# Patient Record
Sex: Male | Born: 1935 | Race: White | Hispanic: No | Marital: Married | Smoking: Former smoker
Health system: Southern US, Community
[De-identification: ages and names within clinical notes are randomized; demographics above are authoritative.]

## PROBLEM LIST (undated history)

## (undated) DIAGNOSIS — E039 Hypothyroidism, unspecified: Secondary | ICD-10-CM

## (undated) DIAGNOSIS — C61 Malignant neoplasm of prostate: Secondary | ICD-10-CM

## (undated) DIAGNOSIS — E785 Hyperlipidemia, unspecified: Secondary | ICD-10-CM

## (undated) DIAGNOSIS — K219 Gastro-esophageal reflux disease without esophagitis: Secondary | ICD-10-CM

## (undated) DIAGNOSIS — I10 Essential (primary) hypertension: Secondary | ICD-10-CM

## (undated) DIAGNOSIS — R0600 Dyspnea, unspecified: Secondary | ICD-10-CM

## (undated) DIAGNOSIS — I714 Abdominal aortic aneurysm, without rupture, unspecified: Secondary | ICD-10-CM

## (undated) DIAGNOSIS — I219 Acute myocardial infarction, unspecified: Secondary | ICD-10-CM

## (undated) DIAGNOSIS — E119 Type 2 diabetes mellitus without complications: Secondary | ICD-10-CM

## (undated) DIAGNOSIS — I509 Heart failure, unspecified: Secondary | ICD-10-CM

## (undated) DIAGNOSIS — M1990 Unspecified osteoarthritis, unspecified site: Secondary | ICD-10-CM

## (undated) DIAGNOSIS — I2510 Atherosclerotic heart disease of native coronary artery without angina pectoris: Secondary | ICD-10-CM

## (undated) DIAGNOSIS — Z87891 Personal history of nicotine dependence: Secondary | ICD-10-CM

## (undated) DIAGNOSIS — Z9289 Personal history of other medical treatment: Secondary | ICD-10-CM

## (undated) DIAGNOSIS — I119 Hypertensive heart disease without heart failure: Secondary | ICD-10-CM

## (undated) DIAGNOSIS — C4490 Unspecified malignant neoplasm of skin, unspecified: Secondary | ICD-10-CM

## (undated) HISTORY — DX: Hypothyroidism, unspecified: E03.9

## (undated) HISTORY — DX: Essential (primary) hypertension: I10

## (undated) HISTORY — DX: Personal history of nicotine dependence: Z87.891

## (undated) HISTORY — DX: Atherosclerotic heart disease of native coronary artery without angina pectoris: I25.10

## (undated) HISTORY — PX: KNEE ARTHROSCOPY: SUR90

## (undated) HISTORY — PX: CATARACT EXTRACTION: SUR2

## (undated) HISTORY — PX: CARDIAC CATHETERIZATION: SHX172

## (undated) HISTORY — PX: EYE SURGERY: SHX253

## (undated) HISTORY — DX: Unspecified osteoarthritis, unspecified site: M19.90

## (undated) HISTORY — DX: Hyperlipidemia, unspecified: E78.5

## (undated) HISTORY — PX: CORONARY ARTERY BYPASS GRAFT: SHX141

---

## 1983-08-01 HISTORY — PX: HERNIA REPAIR: SHX51

## 1998-05-31 ENCOUNTER — Encounter: Payer: Self-pay | Admitting: Cardiovascular Disease

## 1998-05-31 ENCOUNTER — Inpatient Hospital Stay (HOSPITAL_COMMUNITY): Admission: EM | Admit: 1998-05-31 | Discharge: 1998-06-04 | Payer: Self-pay | Admitting: Emergency Medicine

## 1998-06-02 ENCOUNTER — Encounter: Payer: Self-pay | Admitting: Internal Medicine

## 1998-06-03 ENCOUNTER — Encounter: Payer: Self-pay | Admitting: Internal Medicine

## 2000-03-16 ENCOUNTER — Ambulatory Visit (HOSPITAL_COMMUNITY): Admission: RE | Admit: 2000-03-16 | Discharge: 2000-03-16 | Payer: Self-pay | Admitting: Cardiovascular Disease

## 2000-03-21 ENCOUNTER — Encounter: Payer: Self-pay | Admitting: Surgery

## 2000-03-23 ENCOUNTER — Encounter: Payer: Self-pay | Admitting: Surgery

## 2000-03-23 ENCOUNTER — Inpatient Hospital Stay (HOSPITAL_COMMUNITY): Admission: RE | Admit: 2000-03-23 | Discharge: 2000-03-27 | Payer: Self-pay | Admitting: Surgery

## 2000-03-24 ENCOUNTER — Encounter: Payer: Self-pay | Admitting: Surgery

## 2000-03-25 ENCOUNTER — Encounter: Payer: Self-pay | Admitting: Cardiothoracic Surgery

## 2000-03-26 ENCOUNTER — Encounter: Payer: Self-pay | Admitting: Cardiothoracic Surgery

## 2000-06-26 ENCOUNTER — Encounter (HOSPITAL_COMMUNITY): Admission: RE | Admit: 2000-06-26 | Discharge: 2000-09-24 | Payer: Self-pay | Admitting: Cardiovascular Disease

## 2000-07-09 ENCOUNTER — Encounter (HOSPITAL_COMMUNITY): Admission: RE | Admit: 2000-07-09 | Discharge: 2000-07-14 | Payer: Self-pay | Admitting: Cardiovascular Disease

## 2000-12-26 ENCOUNTER — Encounter: Payer: Self-pay | Admitting: Cardiovascular Disease

## 2000-12-26 ENCOUNTER — Inpatient Hospital Stay (HOSPITAL_COMMUNITY): Admission: EM | Admit: 2000-12-26 | Discharge: 2000-12-27 | Payer: Self-pay | Admitting: Emergency Medicine

## 2003-07-27 ENCOUNTER — Inpatient Hospital Stay (HOSPITAL_COMMUNITY): Admission: RE | Admit: 2003-07-27 | Discharge: 2003-07-29 | Payer: Self-pay | Admitting: Orthopedic Surgery

## 2003-07-29 ENCOUNTER — Inpatient Hospital Stay (HOSPITAL_COMMUNITY)
Admission: RE | Admit: 2003-07-29 | Discharge: 2003-08-06 | Payer: Self-pay | Admitting: Physical Medicine & Rehabilitation

## 2003-09-03 ENCOUNTER — Ambulatory Visit (HOSPITAL_COMMUNITY): Admission: RE | Admit: 2003-09-03 | Discharge: 2003-09-03 | Payer: Self-pay | Admitting: Orthopedic Surgery

## 2003-10-09 ENCOUNTER — Ambulatory Visit (HOSPITAL_COMMUNITY): Admission: RE | Admit: 2003-10-09 | Discharge: 2003-10-09 | Payer: Self-pay | Admitting: Gastroenterology

## 2004-01-11 ENCOUNTER — Ambulatory Visit (HOSPITAL_COMMUNITY): Admission: RE | Admit: 2004-01-11 | Discharge: 2004-01-11 | Payer: Self-pay | Admitting: Cardiovascular Disease

## 2005-02-26 ENCOUNTER — Emergency Department (HOSPITAL_COMMUNITY): Admission: EM | Admit: 2005-02-26 | Discharge: 2005-02-26 | Payer: Self-pay | Admitting: Emergency Medicine

## 2008-05-01 ENCOUNTER — Encounter: Admission: RE | Admit: 2008-05-01 | Discharge: 2008-05-01 | Payer: Self-pay | Admitting: Orthopedic Surgery

## 2009-01-25 ENCOUNTER — Inpatient Hospital Stay (HOSPITAL_COMMUNITY): Admission: AD | Admit: 2009-01-25 | Discharge: 2009-01-28 | Payer: Self-pay | Admitting: Cardiovascular Disease

## 2009-02-23 ENCOUNTER — Inpatient Hospital Stay (HOSPITAL_COMMUNITY): Admission: EM | Admit: 2009-02-23 | Discharge: 2009-02-25 | Payer: Self-pay | Admitting: Emergency Medicine

## 2010-04-15 ENCOUNTER — Ambulatory Visit: Payer: Self-pay | Admitting: Cardiovascular Disease

## 2010-10-13 ENCOUNTER — Ambulatory Visit (INDEPENDENT_AMBULATORY_CARE_PROVIDER_SITE_OTHER): Payer: Medicare Other | Admitting: Cardiovascular Disease

## 2010-10-13 DIAGNOSIS — I200 Unstable angina: Secondary | ICD-10-CM

## 2010-10-14 ENCOUNTER — Ambulatory Visit: Payer: Self-pay | Admitting: Cardiovascular Disease

## 2010-10-17 ENCOUNTER — Ambulatory Visit (HOSPITAL_COMMUNITY)
Admission: RE | Admit: 2010-10-17 | Discharge: 2010-10-18 | Disposition: A | Discharge status: Home / Self Care | Payer: Medicare Other | Source: Ambulatory Visit | Attending: Cardiovascular Disease | Admitting: Cardiovascular Disease

## 2010-10-17 ENCOUNTER — Inpatient Hospital Stay (HOSPITAL_BASED_OUTPATIENT_CLINIC_OR_DEPARTMENT_OTHER)
Admission: RE | Admit: 2010-10-17 | Discharge: 2010-10-17 | Disposition: A | Discharge status: Hospice | Payer: Medicare Other | Source: Ambulatory Visit | Attending: Cardiovascular Disease | Admitting: Cardiovascular Disease

## 2010-10-17 DIAGNOSIS — E119 Type 2 diabetes mellitus without complications: Secondary | ICD-10-CM | POA: Insufficient documentation

## 2010-10-17 DIAGNOSIS — I2510 Atherosclerotic heart disease of native coronary artery without angina pectoris: Secondary | ICD-10-CM | POA: Insufficient documentation

## 2010-10-17 DIAGNOSIS — R079 Chest pain, unspecified: Secondary | ICD-10-CM | POA: Insufficient documentation

## 2010-10-17 DIAGNOSIS — E039 Hypothyroidism, unspecified: Secondary | ICD-10-CM | POA: Insufficient documentation

## 2010-10-17 DIAGNOSIS — E785 Hyperlipidemia, unspecified: Secondary | ICD-10-CM | POA: Insufficient documentation

## 2010-10-17 DIAGNOSIS — R0602 Shortness of breath: Secondary | ICD-10-CM | POA: Insufficient documentation

## 2010-10-17 DIAGNOSIS — I252 Old myocardial infarction: Secondary | ICD-10-CM | POA: Insufficient documentation

## 2010-10-17 DIAGNOSIS — I10 Essential (primary) hypertension: Secondary | ICD-10-CM | POA: Insufficient documentation

## 2010-10-17 DIAGNOSIS — Z9861 Coronary angioplasty status: Secondary | ICD-10-CM | POA: Insufficient documentation

## 2010-10-17 DIAGNOSIS — Z01810 Encounter for preprocedural cardiovascular examination: Secondary | ICD-10-CM | POA: Insufficient documentation

## 2010-10-17 DIAGNOSIS — E7800 Pure hypercholesterolemia, unspecified: Secondary | ICD-10-CM | POA: Insufficient documentation

## 2010-10-17 LAB — GLUCOSE, CAPILLARY
Glucose-Capillary: 97 mg/dL (ref 70–99)
Glucose-Capillary: 85 mg/dL (ref 70–99)
Glucose-Capillary: 207 mg/dL — ABNORMAL HIGH (ref 70–99)

## 2010-10-17 LAB — POCT I-STAT GLUCOSE
Glucose, Bld: 121 mg/dL — ABNORMAL HIGH (ref 70–99)
Operator id: 178271

## 2010-10-17 LAB — POCT ACTIVATED CLOTTING TIME
Activated Clotting Time: 169 seconds
Activated Clotting Time: 481 seconds

## 2010-10-18 LAB — CBC
HCT: 38 % — ABNORMAL LOW (ref 39.0–52.0)
Hemoglobin: 12.7 g/dL — ABNORMAL LOW (ref 13.0–17.0)
MCH: 24.9 pg — ABNORMAL LOW (ref 26.0–34.0)
MCHC: 33.4 g/dL (ref 30.0–36.0)
MCV: 74.5 fL — ABNORMAL LOW (ref 78.0–100.0)
Platelets: 160 10*3/uL (ref 150–400)
RBC: 5.1 MIL/uL (ref 4.22–5.81)
RDW: 14.8 % (ref 11.5–15.5)
WBC: 4.9 10*3/uL (ref 4.0–10.5)

## 2010-10-18 LAB — BASIC METABOLIC PANEL
BUN: 16 mg/dL (ref 6–23)
CO2: 29 mEq/L (ref 19–32)
Calcium: 8.5 mg/dL (ref 8.4–10.5)
Chloride: 107 mEq/L (ref 96–112)
Creatinine, Ser: 1.12 mg/dL (ref 0.4–1.5)
GFR calc Af Amer: >60 mL/min (ref >60)
GFR calc non Af Amer: >60 mL/min (ref >60)
Glucose, Bld: 145 mg/dL — ABNORMAL HIGH (ref 70–99)
Potassium: 4.3 mEq/L (ref 3.5–5.1)
Sodium: 139 mEq/L (ref 135–145)

## 2010-10-18 LAB — GLUCOSE, CAPILLARY: Glucose-Capillary: 159 mg/dL — ABNORMAL HIGH (ref 70–99)

## 2010-10-18 NOTE — Procedures (Signed)
  NAMESAMAJ, WESSELLS             ACCOUNT NO.:  [id number]  MEDICAL RECORD NO.:  [id number]           PATIENT TYPE:  O  LOCATION:  6529                         FACILITY:  [facility]  PHYSICIAN:  Verne Carrow, MDDATE OF BIRTH:  Dec 25, 1934  DATE OF PROCEDURE:  10/17/2010 DATE OF DISCHARGE:                           CARDIAC CATHETERIZATION   PRIMARY CARDIOLOGIST:  Vesta Mixer, MD  PROCEDURES PERFORMED: 1. Percutaneous transluminal coronary angioplasty with placement of     drug-eluting stent in the distal right coronary artery. 2. Percutaneous transluminal coronary angioplasty with placement of     drug-eluting stent in the mid right coronary artery.  OPERATOR:  Verne Carrow, MD  INDICATIONS:  Coronary artery disease with progression of disease in the right coronary artery by diagnostic cath per Dr. Elease Hashimoto earlier today. I was asked to assist with coronary intervention.  PROCEDURE IN DETAIL:  The patient was brought upstairs from the Outpatient Cath Lab to the Main Cath Lab.  A 4-French sheath was present in the right femoral artery.  The patient was placed supine on cath table.  Consent for possible intervention was obtained from the patient prior to the initial diagnostic procedure.  The right groin was prepped and draped in a sterile fashion.  The sheath was exchanged out for a 6- [place] sheath over a wire with sterile procedure.  We then gave the patient a bolus of Angiomax and a drip was started.  He had been given an additional 300 mg of Plavix in the Outpatient Cath Lab.  A JR-4 guiding catheter with side holes was used to selectively engage the native right coronary artery.  When ACT was greater than 200, I passed a Cougar intracoronary wire down the length of the right coronary artery. A 2.0 x 15 mm balloon was inflated in 3 overlapping inflations in the distal right coronary artery.  A 2.25 x 32 mm Promus Element drug- eluting stent was deployed in the  distal vessel.  A 2.25 x 20 mm noncompliant balloon was inflated twice within the stented segment in the distal portion of the vessel.  The stenosis was taken from 80% to 0%.  At this point, we turned our attention toward the severe stenosis in the mid vessel.  A 2.5 x 8 mm Promus Element drug-eluting stent was carefully positioned in the area of tightest stenosis and was deployed. A 2.5 x 6 mm noncompliant balloon was carefully positioned inside the stented segment and was inflated without difficulty.  The stenosis was taken from 80% to 0%.  IMPRESSION:  Successful percutaneous transluminal coronary angioplasty with placement of a drug-eluting stent in the distal right coronary artery and a drug-eluting stent in the mid right coronary artery.  Of note, the stents did not overlap.  RECOMMENDATIONS:  The patient should be continued on aspirin and Plavix for at least 1 year.  We will continue his other cardiac medications as written.     Verne Carrow, MD     CM/MEDQ  D:  10/17/2010  T:  10/18/2010  Job:  [id number]  Electronically Signed by Verne Carrow MD on 10/18/2010 10:09:24 AM

## 2010-11-06 LAB — GLUCOSE, CAPILLARY
Glucose-Capillary: 107 mg/dL — ABNORMAL HIGH (ref 70–99)
Glucose-Capillary: 131 mg/dL — ABNORMAL HIGH (ref 70–99)
Glucose-Capillary: 94 mg/dL (ref 70–99)
Glucose-Capillary: 105 mg/dL — ABNORMAL HIGH (ref 70–99)

## 2010-11-06 LAB — COMPREHENSIVE METABOLIC PANEL
ALT: 26 U/L (ref 0–53)
AST: 25 U/L (ref 0–37)
Albumin: 3.5 g/dL (ref 3.5–5.2)
Alkaline Phosphatase: 59 U/L (ref 39–117)
BUN: 20 mg/dL (ref 6–23)
CO2: 28 mEq/L (ref 19–32)
Calcium: 9 mg/dL (ref 8.4–10.5)
Chloride: 108 mEq/L (ref 96–112)
Creatinine, Ser: 1.06 mg/dL (ref 0.4–1.5)
GFR calc Af Amer: >60 mL/min (ref >60)
GFR calc non Af Amer: >60 mL/min (ref >60)
Glucose, Bld: 76 mg/dL (ref 70–99)
Potassium: 3.3 mEq/L — ABNORMAL LOW (ref 3.5–5.1)
Sodium: 142 mEq/L (ref 135–145)
Total Bilirubin: 1.4 mg/dL — ABNORMAL HIGH (ref 0.3–1.2)
Total Protein: 6.5 g/dL (ref 6.0–8.3)

## 2010-11-06 LAB — CARDIAC PANEL(CRET KIN+CKTOT+MB+TROPI)
CK, MB: 0.8 ng/mL (ref 0.3–4.0)
Relative Index: INVALID (ref 0.0–2.5)
Relative Index: INVALID (ref 0.0–2.5)
Total CK: 59 U/L (ref 7–232)
Troponin I: 0.01 ng/mL (ref 0.00–0.06)

## 2010-11-06 LAB — CBC
HCT: 37.5 % — ABNORMAL LOW (ref 39.0–52.0)
HCT: 41 % (ref 39.0–52.0)
Hemoglobin: 13.1 g/dL (ref 13.0–17.0)
Hemoglobin: 14 g/dL (ref 13.0–17.0)
MCHC: 34.1 g/dL (ref 30.0–36.0)
MCHC: 34.3 g/dL (ref 30.0–36.0)
MCV: 77.3 fL — ABNORMAL LOW (ref 78.0–100.0)
MCV: 77.5 fL — ABNORMAL LOW (ref 78.0–100.0)
Platelets: 148 10*3/uL — ABNORMAL LOW (ref 150–400)
Platelets: 155 10*3/uL (ref 150–400)
Platelets: 177 10*3/uL (ref 150–400)
RBC: 5.29 MIL/uL (ref 4.22–5.81)
RDW: 15.2 % (ref 11.5–15.5)
RDW: 15.4 % (ref 11.5–15.5)
RDW: 15.6 % — ABNORMAL HIGH (ref 11.5–15.5)
WBC: 6.1 10*3/uL (ref 4.0–10.5)

## 2010-11-06 LAB — POCT I-STAT, CHEM 8
BUN: 23 mg/dL (ref 6–23)
BUN: 23 mg/dL (ref 6–23)
Calcium, Ion: 1.07 mmol/L — ABNORMAL LOW (ref 1.12–1.32)
Calcium, Ion: 1.08 mmol/L — ABNORMAL LOW (ref 1.12–1.32)
Chloride: 107 mEq/L (ref 96–112)
Chloride: 107 mEq/L (ref 96–112)
Creatinine, Ser: 1.1 mg/dL (ref 0.4–1.5)
Creatinine, Ser: 1.2 mg/dL (ref 0.4–1.5)
Glucose, Bld: 68 mg/dL — ABNORMAL LOW (ref 70–99)
Glucose, Bld: 79 mg/dL (ref 70–99)
HCT: 40 % (ref 39.0–52.0)
HCT: 40 % (ref 39.0–52.0)
Hemoglobin: 13.6 g/dL (ref 13.0–17.0)
Hemoglobin: 13.6 g/dL (ref 13.0–17.0)
Potassium: 3.4 mEq/L — ABNORMAL LOW (ref 3.5–5.1)
Potassium: 3.4 mEq/L — ABNORMAL LOW (ref 3.5–5.1)
Sodium: 140 mEq/L (ref 135–145)
Sodium: 142 mEq/L (ref 135–145)
TCO2: 19 mmol/L (ref 0–100)
TCO2: 24 mmol/L (ref 0–100)

## 2010-11-06 LAB — LIPASE, BLOOD: Lipase: 20 U/L (ref 11–59)

## 2010-11-06 LAB — BASIC METABOLIC PANEL
BUN: 21 mg/dL (ref 6–23)
CO2: 27 mEq/L (ref 19–32)
Calcium: 8.7 mg/dL (ref 8.4–10.5)
Chloride: 108 mEq/L (ref 96–112)
Creatinine, Ser: 0.91 mg/dL (ref 0.4–1.5)
Creatinine, Ser: 1.05 mg/dL (ref 0.4–1.5)
GFR calc non Af Amer: >60 mL/min (ref >60)
GFR calc non Af Amer: >60 mL/min (ref >60)
Glucose, Bld: 164 mg/dL — ABNORMAL HIGH (ref 70–99)
Glucose, Bld: 98 mg/dL (ref 70–99)
Potassium: 3.5 mEq/L (ref 3.5–5.1)
Sodium: 141 mEq/L (ref 135–145)

## 2010-11-06 LAB — POCT CARDIAC MARKERS
CKMB, poc: <1 ng/mL — ABNORMAL LOW (ref 1.0–8.0)
CKMB, poc: <1 ng/mL — ABNORMAL LOW (ref 1.0–8.0)
Myoglobin, poc: 87.5 ng/mL (ref 12–200)
Myoglobin, poc: 88.4 ng/mL (ref 12–200)
Troponin i, poc: <0.05 ng/mL (ref 0.00–0.09)

## 2010-11-06 LAB — HEPATIC FUNCTION PANEL
ALT: 27 U/L (ref 0–53)
AST: 28 U/L (ref 0–37)
Albumin: 3.7 g/dL (ref 3.5–5.2)
Alkaline Phosphatase: 60 U/L (ref 39–117)
Bilirubin, Direct: 0.2 mg/dL (ref 0.0–0.3)
Indirect Bilirubin: 1.3 mg/dL — ABNORMAL HIGH (ref 0.3–0.9)
Total Bilirubin: 1.5 mg/dL — ABNORMAL HIGH (ref 0.3–1.2)
Total Protein: 6.8 g/dL (ref 6.0–8.3)

## 2010-11-06 LAB — PROTIME-INR
INR: 1.2 (ref 0.00–1.49)
Prothrombin Time: 15.2 seconds (ref 11.6–15.2)

## 2010-11-06 LAB — DIFFERENTIAL
Basophils Absolute: 0 10*3/uL (ref 0.0–0.1)
Basophils Relative: 1 % (ref 0–1)
Eosinophils Absolute: 0.1 10*3/uL (ref 0.0–0.7)
Eosinophils Relative: 2 % (ref 0–5)
Lymphocytes Relative: 25 % (ref 12–46)
Lymphs Abs: 1.5 10*3/uL (ref 0.7–4.0)
Monocytes Absolute: 0.8 10*3/uL (ref 0.1–1.0)
Monocytes Relative: 14 % — ABNORMAL HIGH (ref 3–12)
Neutro Abs: 3.6 10*3/uL (ref 1.7–7.7)
Neutrophils Relative %: 59 % (ref 43–77)

## 2010-11-06 LAB — CK TOTAL AND CKMB (NOT AT ARMC)
CK, MB: 0.9 ng/mL (ref 0.3–4.0)
Relative Index: INVALID (ref 0.0–2.5)
Total CK: 77 U/L (ref 7–232)

## 2010-11-06 LAB — APTT: aPTT: 103 seconds — ABNORMAL HIGH (ref 24–37)

## 2010-11-06 LAB — TROPONIN I: Troponin I: 0.01 ng/mL (ref 0.00–0.06)

## 2010-11-07 LAB — GLUCOSE, CAPILLARY
Glucose-Capillary: 105 mg/dL — ABNORMAL HIGH (ref 70–99)
Glucose-Capillary: 105 mg/dL — ABNORMAL HIGH (ref 70–99)
Glucose-Capillary: 105 mg/dL — ABNORMAL HIGH (ref 70–99)
Glucose-Capillary: 127 mg/dL — ABNORMAL HIGH (ref 70–99)
Glucose-Capillary: 80 mg/dL (ref 70–99)
Glucose-Capillary: 87 mg/dL (ref 70–99)

## 2010-11-07 LAB — BASIC METABOLIC PANEL
BUN: 13 mg/dL (ref 6–23)
Calcium: 9 mg/dL (ref 8.4–10.5)
Chloride: 107 mEq/L (ref 96–112)
Creatinine, Ser: 1.09 mg/dL (ref 0.4–1.5)
GFR calc Af Amer: >60 mL/min (ref >60)
GFR calc Af Amer: >60 mL/min (ref >60)
GFR calc non Af Amer: >60 mL/min (ref >60)
GFR calc non Af Amer: >60 mL/min (ref >60)
Potassium: 3.9 mEq/L (ref 3.5–5.1)
Potassium: 4.1 mEq/L (ref 3.5–5.1)
Sodium: 138 mEq/L (ref 135–145)

## 2010-11-07 LAB — DIFFERENTIAL
Basophils Absolute: 0 10*3/uL (ref 0.0–0.1)
Eosinophils Absolute: 0 10*3/uL (ref 0.0–0.7)
Eosinophils Relative: 1 % (ref 0–5)
Lymphs Abs: 0.8 10*3/uL (ref 0.7–4.0)
Neutrophils Relative %: 67 % (ref 43–77)

## 2010-11-07 LAB — CBC
HCT: 36.8 % — ABNORMAL LOW (ref 39.0–52.0)
Hemoglobin: 14.9 g/dL (ref 13.0–17.0)
MCHC: 33.8 g/dL (ref 30.0–36.0)
MCV: 79.2 fL (ref 78.0–100.0)
Platelets: 159 10*3/uL (ref 150–400)
RBC: 5.58 MIL/uL (ref 4.22–5.81)
RDW: 15.5 % (ref 11.5–15.5)
WBC: 4.5 10*3/uL (ref 4.0–10.5)
WBC: 6.4 10*3/uL (ref 4.0–10.5)

## 2010-11-07 LAB — COMPREHENSIVE METABOLIC PANEL
ALT: 37 U/L (ref 0–53)
AST: 28 U/L (ref 0–37)
Alkaline Phosphatase: 61 U/L (ref 39–117)
CO2: 26 mEq/L (ref 19–32)
Calcium: 9.2 mg/dL (ref 8.4–10.5)
GFR calc Af Amer: >60 mL/min (ref >60)
GFR calc non Af Amer: >60 mL/min (ref >60)
Potassium: 3.8 mEq/L (ref 3.5–5.1)
Sodium: 138 mEq/L (ref 135–145)

## 2010-11-07 LAB — CARDIAC PANEL(CRET KIN+CKTOT+MB+TROPI)
CK, MB: 1.3 ng/mL (ref 0.3–4.0)
Relative Index: 9.1 — ABNORMAL HIGH (ref 0.0–2.5)
Relative Index: 9.2 — ABNORMAL HIGH (ref 0.0–2.5)
Relative Index: INVALID (ref 0.0–2.5)
Total CK: 265 U/L — ABNORMAL HIGH (ref 7–232)
Total CK: 58 U/L (ref 7–232)
Troponin I: 7.06 ng/mL (ref 0.00–0.06)

## 2010-11-07 LAB — TSH: TSH: 1.738 u[IU]/mL (ref 0.350–4.500)

## 2010-11-07 LAB — BRAIN NATRIURETIC PEPTIDE: Pro B Natriuretic peptide (BNP): 58 pg/mL (ref 0.0–100.0)

## 2010-11-07 LAB — PLATELET COUNT: Platelets: 184 10*3/uL (ref 150–400)

## 2010-11-08 ENCOUNTER — Ambulatory Visit: Payer: Medicare Other | Admitting: Cardiovascular Disease

## 2010-11-08 NOTE — Procedures (Signed)
  NAMEDECARI, DUGGAR NO.:  [id number]  MEDICAL RECORD NO.:  [id number]           PATIENT TYPE:  O  LOCATION:  6529                         FACILITY:  [facility]  PHYSICIAN:  Vesta Mixer, M.D. DATE OF BIRTH:  April 23, 1935  DATE OF PROCEDURE:  10/17/2010 DATE OF DISCHARGE:                           CARDIAC CATHETERIZATION   HISTORY:  Benjamin Harrison is an elderly gentleman with a history of coronary artery disease and a previous inferior wall myocardial infarction.  He is status post PTCA and stenting of his mid and distal right coronary artery.  He has had previous bypass grafting in the saphenous vein graft, the right coronary artery is closed.  Benjamin Harrison presented with several-week history of progressive shortness of breath and intrascapular chest pain in particular with exercise.  The pain resolves with nitroglycerin.  Because of his symptoms, we proceeded the heart catheterization.  The patient has a history of hypertension, hypothyroidism, diabetes mellitus and hypercholesterolemia.  He is currently on Plavix and aspirin.  The procedure was left heart catheterization with coronary angiography. The right femoral artery was easily cannulated using modified Seldinger technique.  HEMODYNAMIC RESULTS:  LV pressure is 141/21.  Aortic pressure is 140/67.  ANGIOGRAPHY:  Left main is small to moderate size.  It is patent.  The left anterior descending artery is occluded proximally.  Left circumflex artery.  Left circumflex artery has minor luminal irregularities.  It supplies a large obtuse marginal artery distribution which is normal.  The right coronary artery has a stent in the mid segment and a stent in the distal segment.  There is a 50-60% stenosis just proximal to the mid stent.  Between the mid and distal stents there is a long 80% stenosis. This vessel fairly small and perhaps is 2 to 2.25 mm in diameter.  The posterior descending artery is fairly  small but is free of critical disease.  The left ventriculogram reveals normal left ventricular systolic function.  Ejection fraction is about 65%.  COMPLICATIONS:  None.  CONCLUSION:  History of coronary artery disease - status post coronary artery bypass grafting.  He has critical disease in his right coronary artery.  I have discussed the case with Dr. Clifton James.  We will proceed with PTCA and stenting of the distal right coronary artery with possible PTCA and stenting of the mid right coronary artery.  I have discussed this with the patient.  He understands and agrees to proceed.     Vesta Mixer, M.D.     PJN/MEDQ  D:  10/17/2010  T:  10/18/2010  Job:  [id number]  cc:   Benjamin Carrow, MD Windle Guard, M.D.  Electronically Signed by Kristeen Miss M.D. on 11/08/2010 09:07:16 AM

## 2010-11-08 NOTE — Discharge Summary (Signed)
NAMEDERK, DOUBEK NO.:  [id number]  MEDICAL RECORD NO.:  [id number]           PATIENT TYPE:  O  LOCATION:  6529                         FACILITY:  [facility]  PHYSICIAN:  Vesta Mixer, M.D. DATE OF BIRTH:  1935-02-14  DATE OF ADMISSION:  10/17/2010 DATE OF DISCHARGE:  10/18/2010                              DISCHARGE SUMMARY   PRIMARY CARDIOLOGIST:  Vesta Mixer, MD  PRIMARY CARE PROVIDER:  Windle Guard, MD  DISCHARGE DIAGNOSES: 1. Coronary artery disease status post coronary artery bypass grafting     in 2001.  LIMA to LAD, SVG to RCA, SVG to second diagonal, and SVG     to the left circumflex/posterior lateral vessel.  Known occlusion     of the saphenous vein graft to the right coronary artery per     cardiac catheterization in 2002.     a.     Status post acute inferior wall myocardial infarction      following Lexiscan Cardiolite study was successful, percutaneous      transluminal coronary angioplasty and stenting of his right      coronary artery on January 28, 2009.     b.     Status post percutaneous transluminal coronary angioplasty      and stenting of the distal right coronary artery on February 24, 2009.      There is continued patency of the mid right coronary artery stent      at this time.     c.     Status post cardiac catheterization on October 17, 2010:      Successful percutaneous transluminal coronary angioplasty with      placement of a drug-eluting stent in the distal right coronary      artery and a drug-eluting stent in the mid right coronary artery. 2. Hypertension. 3. Hypothyroidism. 4. Hyperlipidemia. 5. Non-insulin-dependent diabetes mellitus.  ALLERGIES: 1. CODEINE causing nausea and vomiting 2. HYDROMORPHONE causing itching. 3. ACE INHIBITORS causing cough.  PROCEDURE/DIAGNOSTICS PERFORMED DURING HOSPITALIZATION: 1. Diagnostic left heart catheterization.     a.     Right coronary artery stent in the midsegment, and a  stent      in the distal segment with 50-60% stenosis proximal to the mid      stent.  There is a long 80% stenosis between the mid and distal      stents.     b.     Ejection fraction of 65%. 2. Status post percutaneous transluminal coronary angioplasty with     placement of a Promus Element 2.25 mm x 32 mm drug-eluting stent in     the distal right coronary artery. 3. Status post percutaneous transluminal coronary angioplasty with     placement of a Promus Element 2.50 mm x 8 mm drug-eluting stent in     the mid right coronary artery.  REASON FOR HOSPITALIZATION:  This is a 76 year old gentleman with known coronary artery disease, as stated above who has had multiple percutaneous coronary interventions as well as coronary artery bypass grafting who presented for follow up with Dr. Elease Hashimoto complaining of exertional chest pain  and shortness of breath.  Episodes were described as heaviness in his chest.  The pain was similar to his prior angina. The pain was resolved at rest.  With the patient's symptoms being concerning for unstable angina, Dr. Elease Hashimoto recommended cardiac catheterization, the patient agreed to proceed.  Risks, benefits, and alternatives were discussed, and the patient understood and agreed to proceed.  HOSPITAL COURSE:  The patient was brought to the cath lab on October 17, 2010, by Dr. Elease Hashimoto, informed consent was obtained.  As above, the patient's right coronary artery showed critical disease.  This case was discussed with Dr. Clifton James, and he agreed to proceed with percutaneous coronary transluminal coronary angioplasty and stenting.  Dr. Clifton James successfully treated with percutaneous transluminal coronary angioplasty and placement of a drug-eluting stent in the distal right coronary artery and a drug-eluting stent in the mid right coronary artery.  Of note, the stents did not overlap.  The patient tolerated the procedure well was taken for overnight observation.   The patient's right groin was without signs of hematoma.  He is able to ambulate without difficulty. The patient had no further complaints of chest pain.  It was noted that the patient should be continued on aspirin and Plavix for at least 1 year.  The patient's Crestor was also increased to 20 mg daily.  Heart- healthy and diabetic diet was also discussed with the patient, he voiced understanding.  On day of discharge, Dr. Elease Hashimoto evaluated the patient and noted him stable for home.  He was hemodynamically stable.  Again, he was without chest pain, was able to ambulate without difficulty.  DISCHARGE LABORATORY DATA:  WBC 4.9, hemoglobin 12.7, hematocrit 38. Sodium 139, potassium 4.3, chloride 107, bicarb 29, BUN 16, creatinine 1.12.  DISCHARGE MEDICATIONS: 1. Nitroglycerin sublingual 0.4 mg 1 tablet under the tongue at the     onset chest pain and may repeat every 5 minutes for up to 3 doses. 2. Crestor 20 mg 1 tablet daily. 3. Metoprolol tartrate 25 mg 1 tablet twice daily. 4. Aspirin enteric-coated 81 mg 1 tablet daily. 5. Diovan 80 mg 1 tablet daily. 6. Amaryl 2 mg 1 tablet daily. 7. Hydrochlorothiazide 25 mg 2 tablets daily. 8. Levoxyl 88 mcg 1 tablet daily. 9. Pepcid 20 mg 1 tablet daily. 10.Plavix 75 mg 1 tablet daily.  DISCHARGE INSTRUCTIONS: 1. The patient will follow up with Dr. Elease Hashimoto on November 08, 2010, at     10:45 a.m. 2. The patient is to increase activity slowly.  He may shower, but no     bathing.  No lifting for 1 week greater than 5 pounds.  No driving     for 2 days.  No sexual activity for 1 week.  He is to keep the cath     site clean and dry, and call the office for any problems. 3. The patient is to avoid straining.  He is to stop any activities     that causes chest pain or shortness of breath. 4. The patient is to continue low-sodium heart-healthy diet as well as     a diabetic diet. 5. The patient is to call the office in the interim for any  questions     or concerns.  DURATION OF DISCHARGE:  Greater than 30 minutes with physician and physician extended time.     Leonette Monarch, PA-C   ______________________________ Vesta Mixer, M.D.    NB/MEDQ  D:  10/18/2010  T:  10/19/2010  Job:  [id number]  cc:   Windle Guard, M.D.  Electronically Signed by Alen Blew P.A. on 10/25/2010 01:19:38 PM Electronically Signed by Kristeen Miss M.D. on 11/08/2010 09:07:14 AM

## 2010-11-10 ENCOUNTER — Encounter: Payer: Self-pay | Admitting: Cardiovascular Disease

## 2010-11-10 DIAGNOSIS — I2510 Atherosclerotic heart disease of native coronary artery without angina pectoris: Secondary | ICD-10-CM | POA: Insufficient documentation

## 2010-11-10 DIAGNOSIS — E039 Hypothyroidism, unspecified: Secondary | ICD-10-CM | POA: Insufficient documentation

## 2010-11-10 DIAGNOSIS — I10 Essential (primary) hypertension: Secondary | ICD-10-CM | POA: Insufficient documentation

## 2010-11-10 DIAGNOSIS — Z87891 Personal history of nicotine dependence: Secondary | ICD-10-CM | POA: Insufficient documentation

## 2010-11-10 DIAGNOSIS — E785 Hyperlipidemia, unspecified: Secondary | ICD-10-CM | POA: Insufficient documentation

## 2010-11-10 DIAGNOSIS — R0602 Shortness of breath: Secondary | ICD-10-CM | POA: Insufficient documentation

## 2010-11-11 ENCOUNTER — Telehealth: Payer: Self-pay | Admitting: Cardiovascular Disease

## 2010-11-11 ENCOUNTER — Ambulatory Visit (INDEPENDENT_AMBULATORY_CARE_PROVIDER_SITE_OTHER): Payer: Medicare Other | Admitting: Cardiovascular Disease

## 2010-11-11 ENCOUNTER — Encounter: Payer: Self-pay | Admitting: Cardiovascular Disease

## 2010-11-11 DIAGNOSIS — R5381 Other malaise: Secondary | ICD-10-CM

## 2010-11-11 DIAGNOSIS — R5383 Other fatigue: Secondary | ICD-10-CM

## 2010-11-11 DIAGNOSIS — I10 Essential (primary) hypertension: Secondary | ICD-10-CM

## 2010-11-11 DIAGNOSIS — I2510 Atherosclerotic heart disease of native coronary artery without angina pectoris: Secondary | ICD-10-CM

## 2010-11-11 MED ORDER — VALSARTAN 160 MG PO TABS: 160.0000 mg | ORAL_TABLET | Freq: Every day | ORAL | Status: DC

## 2010-11-11 NOTE — Assessment & Plan Note (Signed)
He complains of severe fatigue. This does not sound like an angina equivalent. He has had some angina in the past and these episodes sound completely different. He  His medical Dr. Who some blood last week. We'll wait and see what those values showed.

## 2010-11-11 NOTE — Telephone Encounter (Signed)
Recvd call from patient stating that he is taking CRESTOR 20MG .  Please update records, he is not taking LIPITOR.

## 2010-11-11 NOTE — Assessment & Plan Note (Signed)
Benjamin Harrison is very stable from a cardiac standpoint. He denies any episodes of chest pain or shortness breath. He has several stents in his right coronary artery. I'm pleased is not having any angina.

## 2010-11-11 NOTE — Assessment & Plan Note (Addendum)
His blood pressure is mildly elevated today. We'll increase his Diovan to 160 mg a day.  I'll see him again in 3 months for followup visit as well as a basic metabolic profile.

## 2010-11-11 NOTE — Progress Notes (Signed)
Benjamin Harrison Date of Birth  April 25, 1935 [facility] / [facility] [address]     [address] [place], [place]  [address] [phone]  Fax  [phone]  History of Present Illness:  Benjamin Harrison is an elderly gentleman with a history of coronary artery disease, diabetes mellitus, hypertension, and hypothyroidism. He presents today for posthospital visit. He recent had his right coronary artery stented.   He is not having any episodes of chest pain. He complains of significant fatigue and generalized weakness. He denies any shortness of breath. He denies any syncope or presyncope.  Current Outpatient Prescriptions on File Prior to Visit  Medication Sig Dispense Refill  . aspirin 325 MG tablet Take 325 mg by mouth daily.        . clopidogrel (PLAVIX) 75 MG tablet Take 75 mg by mouth daily.        Marland Kitchen glimepiride (AMARYL) 2 MG tablet Take 2 mg by mouth daily before breakfast.        . hydrochlorothiazide 25 MG tablet Take 25 mg by mouth daily.        Marland Kitchen levothyroxine (SYNTHROID, LEVOTHROID) 88 MCG tablet Take 88 mcg by mouth daily.        . metoprolol (LOPRESSOR) 50 MG tablet Take 25 mg by mouth 2 (two) times daily.        . nitroGLYCERIN (NITROSTAT) 0.4 MG SL tablet Place 0.4 mg under the tongue every 5 (five) minutes as needed.        Marland Kitchen atorvastatin (LIPITOR) 40 MG tablet Take 20 mg by mouth daily.          Allergies  Allergen Reactions  . Ace Inhibitors     Past Medical History  Diagnosis Date  . Coronary artery disease     STATUS POST INFERIOR WALL M.I.  . MI (myocardial infarction)     STATUS POST PTC AND STENTING OF HIS MID RCA  . Hypertension   . Hypothyroidism   . Hyperlipidemia   . Chest pain     HEAVINESS  . SOB (shortness of breath) on exertion   . Personal history of tobacco use     REMOTE IN THE PAST    Past Surgical History  Procedure Date  . Coronary artery bypass graft   . Cardiac catheterization     History  Smoking status    . Former Smoker  . Quit date: 08/01/1975  Smokeless tobacco  . Not on file    History  Alcohol Use No    No family history on file.  Reviw of Systems:  Reviewed in the HPI.  All other systems are negative.  Physical Exam: BP 156/108  Pulse 58  Ht 5\' 6"  (1.676 m)  Wt 182 lb (82.555 kg)  BMI 29.38 kg/m2 The patient is alert and oriented x 3.  The mood and affect are normal.  The skin is warm and dry.  Color is normal.  The HEENT exam reveals that the sclera are nonicteric.  The mucous membranes are moist.  The carotids are 2+ without bruits.  There is no thyromegaly.  There is no JVD.  The lungs are clear.  The chest wall is non tender.  The heart exam reveals a regular rate with a normal S1 and S2.  There are no murmurs, gallops, or rubs.  The PMI is not displaced.   Abdominal exam reveals good bowel sounds.  There is no guarding or rebound.  There is no hepatosplenomegaly or tenderness.  There are no masses.  Exam of the legs reveal no clubbing, cyanosis, or edema.  The legs are without rashes.  The distal pulses are intact.  Cranial nerves II - XII are intact.  Motor and sensory functions are intact.  The gait is normal.  Assessment / Plan:

## 2010-12-13 NOTE — Cardiovascular Report (Signed)
[name], Benjamin Harrison NO.:  [id number]   MEDICAL RECORD NO.:  [id number]          PATIENT TYPE:  INP   LOCATION:  2508                         FACILITY:  [facility]   PHYSICIAN:  Vesta Mixer, M.D. DATE OF BIRTH:  05-Dec-1934   DATE OF PROCEDURE:  02/24/2009  DATE OF DISCHARGE:                            CARDIAC CATHETERIZATION   INDICATIONS:  Mr. Burgard is a 76 year old gentleman with a recent PTCA  and stenting of his mid-right coronary artery.  He was admitted last  night for episodes of recurrent chest pain.  He ruled out for myocardial  infarction.  He was referred for heart catheterization for relook of his  right coronary artery.   PROCEDURE:  Left heart catheterization with coronary angiography with  percutaneous transluminal coronary angioplasty and stenting of the  distal right coronary artery.   SURGEON:  Vesta Mixer, MD   PROCEDURE IN DETAIL:  The right femoral artery was easily cannulated  using the modified Seldinger technique.   HEMODYNAMICS:  The LV pressure was 120/4.  The aortic pressure was  120/76.   ANGIOGRAPHY:  Left main:  The left main has a proximal 40% stenosis.  There are some calcifications.   The left anterior descending artery is occluded proximally.   The left circumflex artery is a very large vessel.  There is a proximal  20-30% stenosis.  The first obtuse marginal artery is a very large  branch with minor luminal irregularities.   The posterolateral branch has diffuse 50-60% disease prior to insertion  of the graft.  The graft can be seen filling in a retrograde fashion.  The distal posterolateral branch has only minor luminal irregularities.   The right coronary artery is moderate in size and is dominant.  The  stent in the mid right coronary artery is widely patent.  There is a 60%  stenosis in the distal right coronary artery.  This lesion appears to be  little hazy.  The flow down through this vessel is quite  brisk.   The posterior descending artery and the posterolateral artery have minor  luminal irregularities.   The left ventriculogram was performed in the 30-degree RAO position.  It  reveals normal left ventricular systolic function with an ejection  fraction of around 65%.   PCI:  The patient was given Angiomax.  The ACT was 394.  The right  coronary artery was engaged using a 6-French Judkins right 4 side-hole  guide.  A [place] angioplasty wire was placed down into the distal  right coronary artery.  The distal right coronary lesion was stented  primarily using a 2.25 x 12 mm TAXUS Atom.  It was deployed at 12  atmospheres for 31 seconds.  Post-stent dilatation was achieved using a  [place] Sprinter 2.25 x 12 mm.  It was inflated up to 14 atmospheres.  This  gave [place] a very nice angiographic result.  There was no edge dissection.  There was brisk flow through this lesion.  There was significant  improvement of the lesion.   COMPLICATIONS:  None.   CONCLUSIONS:  1. Successful percutaneous transluminal coronary  angioplasty and      stenting of the distal right coronary artery.  2. Continued patency of the mid right coronary artery stent.  The      patient will be discharge to home tomorrow.      Vesta Mixer, M.D.  Electronically Signed     PJN/MEDQ  D:  02/24/2009  T:  02/24/2009  Job:  [id number]   cc:   Windle Guard, M.D.

## 2010-12-13 NOTE — Discharge Summary (Signed)
Benjamin Harrison, Benjamin Harrison             ACCOUNT NO.:  [id number]   MEDICAL RECORD NO.:  [id number]          PATIENT TYPE:  INP   LOCATION:  2508                         FACILITY:  [facility]   PHYSICIAN:  Vesta Mixer, M.D. DATE OF BIRTH:  March 30, 1935   DATE OF ADMISSION:  02/23/2009  DATE OF DISCHARGE:  02/25/2009                               DISCHARGE SUMMARY   DISCHARGE DIAGNOSES:  1. Coronary artery disease - status post percutaneous transluminal      coronary angioplasty and stenting of his distal right coronary      artery.  2. Dyslipidemia.  3. Hypothyroidism.  4. Diabetes mellitus.  5. Hypertension.   DISCHARGE MEDICATIONS:  1. Aspirin 325 mg a day.  2. Plavix 75 mg a day.  3. Divan 80 mg a day.  4. Zocor 40 mg a day.  5. Synthroid 0.1 mg a day.  6. Amaryl 2 mg a day.  7. Nitroglycerin 0.4 mg as needed sublingually for chest pain.   DISPOSITION:  The patient will see Dr. Elease Hashimoto in 3 weeks.   HISTORY:  Benjamin Harrison is a 76 year old gentleman with history of coronary  artery disease and coronary artery bypass grafting.  He was admitted for  episodes of angina through the emergency room.  Please see dictated H&P  for further details.   HOSPITAL COURSE:  1. Chest pain.  The patient was admitted through the emergency room.      He ruled out for myocardial infarction.  He had recently had an      inferior wall myocardial infarction and is status post PTCA and      stenting of his mid right coronary artery.   The next morning the patient had a heart catheterization, was found to  have a distal right coronary lesion.  This lesion was present during his  initial heart catheterization, but we did not think it was quite  significant.  It looked a little bit more severe during this  catheterization and looked also a little bit hazy.  We performed PTCA  and stenting using a 2.225 x 12-mm Taxus Atom stent.  It was deployed at  12 atmospheres for 31 seconds.  Poststent dilatation was  achieved using  a 2.25 x 12 mm [place] Sprinter up to 14 atmospheres.  We were able to obtain  a very nice angiographic result.  The patient felt quite a bit better  and did not have any significant episodes of chest pain following  procedure.  This morning he feels quite well and can tell that he is  doing much better.   The patient will be discharged to home in stable condition.  All his  other medical problems are stable.  We will see him again in 3 weeks.      Vesta Mixer, M.D.  Electronically Signed     PJN/MEDQ  D:  02/25/2009  T:  02/25/2009  Job:  [id number]   cc:   Windle Guard, M.D.

## 2010-12-13 NOTE — Cardiovascular Report (Signed)
NAMEDELORES, THELEN NO.:  [id number]   MEDICAL RECORD NO.:  [id number]          PATIENT TYPE:  INP   LOCATION:  2912                         FACILITY:  [facility]   PHYSICIAN:  Vesta Mixer, M.D. DATE OF BIRTH:  [birth year]   DATE OF PROCEDURE:  01/25/2009  DATE OF DISCHARGE:                            CARDIAC CATHETERIZATION   Mr. Lightner is a 76 year old gentleman with a history of coronary artery  disease.  He is status post coronary artery bypass grafting.  He  presented recently with episodes of chest pain.  He was getting a  Lexiscan scan Cardiolite study today.  Following the Lexiscan injection,  he started having episodes of chest pain.  He was found to have mild ST-  segment elevation in the inferior leads.  He was admitted to the  hospital and was given heparin and Integrilin in hopes that he would  improve.  He failed to improve and we brought him to the cath lab for  further evaluation.   The procedure was left heart catheterization with coronary angiography  with PTCA and stenting of the mid right coronary artery.   The right femoral artery was easily cannulated using modified Seldinger  technique.   HEMODYNAMICS:  LV pressure was 129/11 with an aortic pressure of 122/73.   ANGIOGRAPHY:  Left main.  The left main has mild irregularities.   The left anterior descending artery is severely and diffusely diseased.  There is competitive flow visible from the IMA.  The left circumflex  artery is fairly large system.  There is moderate proximal stenosis.  The distal LAD has a long 75% stenosis.  It fills the saphenous vein  graft competitively.   The posterolateral segment branch has minor luminal irregularities.   The right coronary artery is occluded at its midpoint.   The saphenous vein graft to the right coronary artery is occluded.  The  obtuse marginal artery is a normal graft.  The anastomosis is normal.  The obtuse marginal artery is  normal.   The saphenous vein graft to the diagonal artery is fairly normal.  It  supplies good bit of the anterolateral wall.   The left internal mammary artery to the LAD is a normal graft.  There is  moderate disease in the distal LAD after being fed by the IMA.   The left ventriculogram was performed in the 30 RAO position.  It  reveals a small area of akinesis of the inferior base.  Otherwise, the  left ventricular systolic function is normal and ejection fraction is 65-  70%.   PTCA and stenting.  The right coronary artery was engaged using a 6-  [place] Judkins right four side-hole guide.  The patient had been on  Integrilin and had received a heparin bolus in the CCU.  The ACT was  274.   A Prowater angioplasty wire was passed easily down to the distal right  coronary artery.  A 2.0 x 15-mm apex was used to open the lesion.  We  inflated up the stenosis up to 6 atmospheres for 20 seconds.  This  resulted in  significant swelling of the heart rate and lowering of blood  pressure.  We turned his nitroglycerin off.  We did 2 other inflations  that were fairly prolonged to allow slow washout of the metabolites to  prevent further slowing and to prevent shock.  We also gave him 0.5 mg  of atropine at that time.  Once the patient was stabilized, we removed  this balloon and then placed a 2.25 mm x 28-mm TAXUS Adam stent.  It was  deployed a 14 atmospheres for 45 seconds.   Poststent dilatation was achieved using a 2.5 x 20-mm Quantum monorail.  It was positioned in the distal aspect of the stent and inflated up to  12 atmospheres for 18 seconds.  It was then positioned in the mid  section of the stent, was inflated up to 14 atmospheres for 20 seconds.  It was then pulled back to the proximal edge of the stent and inflated  up to 12 atmospheres for 15 seconds.  This gave [place] a very nice  angiographic result.   Followup angiography reveals that he has mild diffuse disease in the   proximal segment.  The mid segment now is quite nice.  There is mild  diffuse disease involving the distal RCA.  There is also a 50% focal  lesion just prior to bifurcation of the posterior descending artery and  posterolateral segment artery.  We decide to leave this area alone since  it would involve stenting much of the entire right coronary artery.   The patient is pain free and stable leaving the lab.  He will need  Plavix probably lifelong.      Vesta Mixer, M.D.  Electronically Signed     PJN/MEDQ  D:  01/25/2009  T:  01/26/2009  Job:  [id number]

## 2010-12-13 NOTE — H&P (Signed)
NAMEKYRIAKOS, BABLER NO.:  [id number]   MEDICAL RECORD NO.:  [id number]          PATIENT TYPE:  INP   LOCATION:  4705                         FACILITY:  [facility]   PHYSICIAN:  Vesta Mixer, M.D. DATE OF BIRTH:  09-10-1934   DATE OF ADMISSION:  02/23/2009  DATE OF DISCHARGE:                              HISTORY & PHYSICAL   Mr. Zumstein is a 76 year old gentleman with a history of coronary artery  disease.  He is status post coronary artery bypass grafting.  He is also  status post recent PTCA and stenting of his right coronary artery.  He  is readmitted tonight with episodes of chest discomfort.   The patient presented to my office in early June with episodes of chest  discomfort.  We scheduled him to have a Lexiscan Cardiolite study.  During the Lexiscan study, he started having episodes of chest pain.  His EKG revealed ST-segment elevation in the inferior leads.  We took  him directly to the hospital and started him on Integrilin and heparin  in hopes that we would be able to resolve the EKG changes.  After a very  short time, we found that we were not successful and we took him  emergently to the cath lab.  He was found to have an occluded right  coronary artery.  We had already found that he had had an occlusion of  the saphenous vein graft to the right coronary artery, which has been  known for a couple years.  He underwent PTCA and stenting of his right  coronary artery using a 2.5-mm x 28-mm Taxus-Adam stent.  We deployed  the stent at 14 atmospheres.  Post-stent dilatation was achieved using a  Quantum 2.5 noncompliant balloon up to 14 atmospheres.  This gave [place] a  very nice angiographic result.   The patient states that he has been having intermittent episodes of  chest pain ever since the procedure.  These episodes of chest pain are  described as a pressure-like sensation.  It radiates across his chest,  down his arms through his back.  These episodes  typically occur with  exertion.  Otherwise, relieved with rest.  He took several nitroglycerin  on occasions with relief.  He has never had any severe prolonged  episodes of chest pain, but he has had lots of brief episodes.   The patient denies any syncope or presyncope.  He denies any bruising or  bleeding.  He denies any blood in the stool or blood in his urine.   CURRENT MEDICATIONS:  1. Aspirin 325 mg a day.  2. Plavix 75 mg a day.  3. Diovan 80 mg a day.  4. Zocor 40 mg a day.  5. Synthroid 0.1 mg a day.  6. Amaryl 2 mg a day.  7. Nitroglycerin 0.4 mg as needed.  8. Pepcid over-the-counter.   He is intolerant to ACE INHIBITORS, which cause cough.   PAST MEDICAL HISTORY:  1. History of coronary artery disease.  He is status post coronary      artery bypass grafting.  He has a patent left  internal mammary      artery to the LAD.  The saphenous vein graft to the diagonal artery      is patent.  The saphenous vein graft to the right coronary artery      has been occluded for years.  His native right coronary artery was      angioplastied on June 28.  2. Diabetes mellitus.  3. Hypothyroidism.  4. Hypertension.  5. Hyperlipidemia.   SOCIAL HISTORY:  The patient quit smoking in 1977.  He does not drink  alcohol.   FAMILY HISTORY:  The patient's mother had heart disease.   REVIEW OF SYSTEMS:  Performed in its entirety.  Please see H&P.  All  other systems were reviewed and are negative.   PHYSICAL EXAMINATION:  GENERAL:  He is an elderly gentleman in no acute  distress.  He is alert and oriented x3 and his mood and affect are  normal.  VITAL SIGNS:  His blood pressure is 130/80.  His heart rate is 70.  HEENT:  Sclerae are nonicteric.  His mucous membranes are moist.  NECK:  His carotids are 2+ without bruits.  There is no JVD.  There is  no thyromegaly.  LUNGS:  Clear.  HEART:  Regular rate.  S1 and S2.  His PMI is nondisplaced.  He has no  murmurs.  ABDOMEN:  Mildly  obese.  He has no hepatosplenomegaly.  He has no  guarding or rebound.  His pulses are intact.  EXTREMITIES:  No clubbing, cyanosis, or edema.  There is no edema.  There is no rash or palpable cords.  His distal pulses and proximal  pulses are normal.  His gait is normal.  His neuro exam reveals cranial  nerves II through XII are intact and his motor and sensory function are  intact.   EKG reveals normal sinus rhythm.  He has no ST or T-wave changes.   LABORATORY DATA:  Pending.   Mr. Wymer presents with episodes of chest pain.  These episodes are  quite suspicious for coronary artery disease.  We will admit him to the  hospital and place him on IV nitroglycerin and IV heparin.  We will  anticipate doing heart catheterization tomorrow morning.  All of his  other medical problems remain stable.      Vesta Mixer, M.D.  Electronically Signed     PJN/MEDQ  D:  02/23/2009  T:  02/24/2009  Job:  [id number]   cc:   Windle Guard, M.D.

## 2010-12-13 NOTE — Discharge Summary (Signed)
NAMESALIH, WILLIAMSON             ACCOUNT NO.:  [id number]   MEDICAL RECORD NO.:  [id number]          PATIENT TYPE:  INP   LOCATION:  4709                         FACILITY:  [facility]   PHYSICIAN:  Vesta Mixer, M.D. DATE OF BIRTH:  1935/05/29   DATE OF ADMISSION:  01/25/2009  DATE OF DISCHARGE:  01/28/2009                               DISCHARGE SUMMARY   DISCHARGED DIAGNOSES:  1. Acute inferior wall myocardial infarction following Lexiscan      Cardiolite study.  2. Dyslipidemia.  3. Hypothyroidism.  4. Hypertension.  5. Diabetes mellitus.  6. Status post percutaneous transluminal coronary angioplasty and      stenting of his right coronary artery during this admission.   DISCHARGE MEDICATIONS:  1. Aspirin 325 mg a day.  2. Plavix 75 mg a day.  3. Diovan 80 mg a day.  4. Zocor 40 mg a day.  5. Synthroid 0.1 mg a day.  6. Amaryl 2 mg a day.  7. Nitroglycerin 0.4 mg as needed.  8. Pepcid over the counter as needed.  The patient has been instructed      to stop taking Prevacid.   He is to see me in the office in 1-2 weeks.   HISTORY:  Mr. Zirbes is a 76 year old gentleman with a history of  coronary artery disease - status post coronary artery bypass grafting.  He also has a history of hypertension, hyperlipidemia, and diabetes  mellitus.  He developed episodes of chest pain several weeks ago and had  a stress Cardiolite study.  Immediately, following the Lexiscan  infusion, he started having episodes of chest pain.  He had ST-segment  changes and was brought to the Critical Care Unit.  We attempted to  relieve his pain with nitroglycerin and Integrilin as well as heparin,  but were unsuccessful.  Please see dictated H and P for further details.   HOSPITAL COURSE:  Chest pain:  The patient was admitted with a mild ST-  segment elevation and persistent chest pain.  We tried for a short while  to relieve his pain with medicines, but then ended up taking him to the  Cath  Lab.  He was found to have an occluded right coronary artery.  We  had already known that he had an occluded saphenous vein graft to the  right coronary artery.  He underwent successful PTCA and stenting of the  right coronary artery.  We placed a 2.5 x 28 mm Taxus Adam stent.  It  was deployed at 14 atmospheres.  Post-stent dilatation was achieved  using a noncompliant Quantum 2.5 x 20 mm balloon.  It was inflated up to  14 atmospheres.  This gave [place] a very nice angiographic result.  His EKG  changes and chest pain resolved immediately.  The patient did have  positive cardiac enzymes following that.  He did quite well and did not  have any further pain or problems.  There was no evidence of an RV  infarction.  He did well with cardiac rehab.  We will discharge him now  on the above-noted  medications and disposition.  He had some issues with bradycardia and  therefore was not started on beta blockers.  We will see him in the  office in a week so and start Metoprolol as tolerated.  All of his other  medical problems are stable.      Vesta Mixer, M.D.  Electronically Signed     PJN/MEDQ  D:  01/28/2009  T:  01/28/2009  Job:  [id number]

## 2010-12-16 NOTE — Op Note (Signed)
[place]  Patient:    Benjamin Harrison, Benjamin Harrison                    MRN: [id number] Proc. Date: 03/23/00 Adm. Date:  [id number] Attending:  Cleatrice Burke                           Operative Report  PREOPERATIVE DIAGNOSIS:  Three-vessel coronary artery disease with occlusion of the left anterior descending coronary artery and exertional angina.  POSTOPERATIVE DIAGNOSIS:  Three-vessel coronary artery disease with occlusion of left anterior descending coronary artery and exertional angina.  OPERATIVE PROCEDURE:  Median sternotomy, extracorporeal circulation, coronary artery bypass graft surgery times four using left internal mammary artery graft to the left anterior descending coronary artery, with a saphenous vein graft to diagonal branch of the LAD, a saphenous vein graft to the posterolateral branch of the left circumflex coronary artery, and a saphenous vein graft to the distal right coronary artery.  ATTENDING SURGEON:  Dr. Evelene Croon.  ASSISTANT:  Maple Mirza, PAD.  ANESTHESIA:  General endotracheal.  CLINICAL HISTORY:  This patient is a 76 year old gentleman with a history of coronary artery disease status post catheterization in November of 99 showing essentially single vessel disease with 60 to 65% proximal LAD stenosis.  He has done well on medical therapy but for the past five or six months has had worsening exertional chest pressure and shortness of breath as well as a decreased energy level.  He underwent repeat cardiac catheterization on March 16, 2000, which showed three-vessel coronary artery disease.  The LAD was now occluded proximally and filled the mid and distal portion of the vessel by collaterals from the right coronary artery.  There is a large diagonal branch that had a 50 to 60% proximal stenosis.  The left circumflex had minor proximal luminal irregularities.  There was a 40 to 50% stenosis in the distal left  circumflex after giving off a large marginal branch.  There was a large posterolateral branch that had minor luminal irregularities.  There was a small first marginal that had 20 to 30% proximal stenosis.  The right coronary artery was a moderate size codominant vessel with diffuse 30 to 40% proximal stenosis.  Left ventricular ejection fraction was 40 to 50% with anterior and apical hypokinesis.  After review of the angiogram and examination of the patient and review of his history, it was felt that coronary artery bypass graft surgery was the best treatment.  I discussed the operative procedure with him and his wife, including alternatives, benefits, and risks, including bleeding, possible blood transfusion, infection, stroke, myocardial infarction, and death.  They understood and agreed to proceed.  PROCEDURE IN DETAIL:  The patient was taken to the operating room and placed on the table in the supine position.  After induction of general endotracheal anesthesia, a Foley catheter was placed in the bladder using sterile technique.  Then, the chest, abdomen, and both lower extremities were prepped and draped in the usual sterile manner.  The chest was entered through a median sternotomy incision, the pericardium opened in the midline. Examination of the heart showed good ventricular contractility.  The ascending aorta had no palpable plaques in it.  Then, the left internal mammary artery was harvested from the chest wall as a pedicle graft.   This was a medium caliber vessel with excellent blood flow through it.  At the same time, a 7  mm saphenous vein was harvested from the right lower leg.  This vein was of medium size and good quality.  The patient was placed on cardiopulmonary bypass and the distal coronary was identified.  The LAD was a large graftable vessel with minimal distal disease in it.  There was a large diagonal branch that had no distal disease in it. The first marginal  was a very small nongraftable vessel.  The second marginal was a large vessel with no disease in it.  The posterolateral branch had no distal disease in it.  The right coronary artery gave off a large posterior descending branch which had a fairly high takeoff near the acute marginal. There is another small branch that came off distally where the posterior descending would typically be.  Then, the aorta was cross clamped and 700 cc of cold blood antegrade cardioplegia was administered in the aortic root with quick arrest of the heart.  Systemic hypothermia at 23 degrees Centigrade and topical hypothermia with iced saline was used.  A temperature probe was placed in the septum and insulating pad in the pericardium.  The first distal anastomosis was performed to the diagonal branch of the LAD. The internal diameter was 1.75 mm.  The conduit used was a segment of greater saphenous vein, the anastomosis performed in an end-to-side manner using continuous 7-0 Prolene suture.  Flow was admitted through the graft and was excellent.  The second distal anastomosis was performed to the posterolateral branch of the left circumflex coronary artery.  The internal diameter was about 2 mm. The conduit used was a second 7 mm greater saphenous vein. The anastomosis was performed in an end-to-side manner using continuous 7-0 Prolene suture.  Flow was noted through the graft and was excellent.  The third anastomosis was performed to the distal right coronary artery.  The internal diameter was about 1.75 mm.  The conduit used was a third 7 mm saphenous vein and anastomosis was performed in an end-to-side manner using continuous 7-0 Prolene suture.  Flow was noted through the graft and was excellent.  Then, a dose of cardioplegia was given down the vein grafts and in the aortic root.  The fourth distal anastomosis was performed to the distal portion of the left anterior descending coronary artery.  The  internal diameter was about 2 mm. The conduit used was a left internal mammary artery and this was brought through an opening in the left pericardium anterior to the phrenic nerve.  It  was anastomosed to the LAD in an end-to-side manner using continuous 8-0 Prolene suture.  The pedicle was tacked to the epicardium with 6-0 Prolene sutures.  The patient was rewarmed to 37 degrees Centigrade and clamp removed from the mammary pedicle.  There was rapid warming of the ventricular septum and return of spontaneous ventricular fibrillation.  The cross clamp was removed with time of 47 minutes, and the patient defibrillated in a sinus rhythm.  A partial occlusion clamp was placed on the aortic root and the three proximal vein graft anastomoses were performed in an end-to-side manner using continuous 6-0 Prolene suture.  The clamp was removed, the vein grafts de-aired.  The proximal and distal anastomoses appeared hemostatic and allowed to graft satisfactorily.  Graft markers were placed around the proximal anastomoses.  Two temporary right ventricular and right atrial pacing wires were placed and brought out through the skin.  When the patient was rewarmed to 37 degrees Centigrade, he was weaned from cardiopulmonary bypass on non-inotropic  agents.  Total bypass time was 89 minutes.  Cardiac function appeared excellent with a cardiac output of 5 liters a minute.  Protamine was given and the venous and aortic cannulas were removed without difficulty.  Hemostasis was achieved.  Three chest tubes were placed with two in the posterior pericardium, one in the left pleural space and one in the anterior mediastinum.  The pericardium was reapproximated over the heart.  The sternum was closed with #6 stainless steel wires.  The fascia was closed with continuous #1 Vicryl suture.  Subcutaneous tissue was closed with continuous 2-0 Vicryl and the skin with 3-0 Vicryl subcuticular closure. The lower  extremity vein harvest site was closed in layers in a similar manner.  The sponge, needle and instrument counts were correct according to the scrub nurse.  Dry sterile dressings were applied over the incisions around around the chest tubes which were hooked to suction.  The patient remained hemodynamically stable and was transported to the SICU in guarded but stable condition. DD:  03/23/00 TD:  03/25/00 Job: [id number]

## 2010-12-16 NOTE — Discharge Summary (Signed)
[place]  Patient:    Benjamin Harrison, Benjamin Harrison                    MRN: [id number] Adm. Date:  [id number] Disc. Date: 12/27/00 Attending:  Koren Bound CC:         Dr. Windle Guard   Discharge Summary  DISCHARGE DIAGNOSES: 1. Noncardiac chest pain. 2. History of coronary artery disease with coronary artery bypass grafting    in August of 2001. 3. Hypothyroidism. 4. Hypertension. 5. Bradycardia.  DISCHARGE MEDICATIONS: 1. Lopressor 25 mg p.o. b.i.d. 2. Nitroglycerin as needed. 3. Synthroid 0.1 mg a day. 4. Enteric coated aspirin 81 mg a day. 5. Norvasc 5 mg a day. 6. Enalapril 5 mg a day. 7. Prevacid 30 mg a day.  DISPOSITION:  The patient has been instructed to eat a low fat, low cholesterol diet.  He is to call for any further problems.  He is to see Alvia Grove., M.D., next week.  HISTORY OF PRESENT ILLNESS:  Benjamin Harrison is a 76 year old gentleman with a history of hypertension and coronary artery disease.  He is status post coronary artery bypass grafting approximately eight or nine months ago.  He was admitted with an episode of chest pain yesterday.  The patient has done fairly well since his coronary artery bypass grafting.  Yesterday he awoke with a headache and chest and left arm pain.  He took several nitroglycerin with no relief.  He presented to the office.  He was not found to have any significant EKG changes but was admitted to hospital for further evaluation.  HOSPITAL COURSE BY PROBLEMS:  #1 - CHEST PAIN:  The patient ruled out for myocardial infarction with serial CPKs.  He did not have any EKG changes.  He does have old inferior wall Q-waves.  The patient felt better the next day.  He continues to feel somewhat weak and washed out but has not had any further episodes of chest pain.  I had a long discussion with the patient and his wife.  We will hold off on doing any interventional or invasive  procedures.  We will see him back in the office as an outpatient and will consider a Cardiolite study.  A D-dimer test was at the upper limits of normal.  Clinically the episode did not sound like a pulmonary embolus, although this cannot be completely ruled out.  #2 - HYPOTHYROIDISM:  Stable.  #3 - HYPERTENSION:  Stable.  #4 - BRADYCARDIA:  The patient had been on a combination of Verapamil and Lopressor.  The Verapamil was changed to Norvasc and the Lopressor dose was decreased.  This will be followed as an outpatient. DD:  12/27/00 TD:  12/27/00 Job: [id number]

## 2010-12-16 NOTE — Cardiovascular Report (Signed)
[place]. [facility]  Patient:    Benjamin Harrison, Benjamin Harrison                    MRN: [id number] Proc. Date: 03/16/00 Adm. Date:  [id number] Attending:  Koren Bound CC:         Hadassah Pais. Jeannetta Nap, M.D.             [facility]                        Cardiac Catheterization  HISTORY:  Benjamin Harrison is a 76 year old gentleman with a history of coronary artery disease.  He recently had started having some worsening episodes of chest pain and pressure.  These symptoms have been limiting his lifestyle and have not allowed him to work as much as he would like.  He is referred for heart catheterization for further evaluation.  DESCRIPTION OF PROCEDURE:  The right femoral artery was easily cannulated using a modified Seldinger technique.  HEMODYNAMICS:  The LV pressure was 143/24 with an aortic pressure of 143/80.  ANGIOGRAPHY:  The left main is relatively small but is otherwise unremarkable.  The left anterior descending artery is flush-occluded proximally.  The LAD can be seen filling the collaterals from the right coronary artery.  The first diagonal vessel is a fairly large vessel.  There is a 50-60% stenosis in the proximal aspect of this vessel.  The left circumflex artery is large and is codominant.  There are minor luminal irregularities in the proximal circumflex artery.  There is a 40-50% stenosis in the distal circumflex artery just after giving off a large obtuse marginal artery.  The distal circumflex artery supplies a posterolateral branch with only minor luminal irregularities.  The first obtuse marginal artery has a 20-30% stenosis proximally.  The right coronary artery is moderate-sized and is codominant.  There are diffuse 30% to 40% stenoses in the proximal aspect of the vessel.  The posterior descending artery has only minor luminal irregularities.  The RV marginal branch is supplying collaterals to the large LAD.  The LAD fills  all the way back to at least the mid-segment.  Several diagonals and multiple septals are visualized.  The LAD is seen to a fairly big vessel.  LEFT VENTRICULOGRAM:  The left ventriculogram was performed in a 30 RAO position.  It revealed hypokinesis of the anterior and apical walls.  The ejection fraction was approximately 40-50%.  There is no mitral regurgitation.  COMPLICATIONS:  None.  CONCLUSIONS:  Moderate-to-severe coronary artery disease including a completely occluded left anterior descending.  He has moderate disease in the first diagonal vessel and the left circumflex artery.  The left anterior descending is not a candidate for a percutaneous transluminal coronary angioplasty.  We will need to consider coronary artery bypass grafting, given his limiting symptoms. DD:  03/16/00 TD:  03/16/00 Job: [id number]

## 2010-12-16 NOTE — Discharge Summary (Signed)
[place]. [facility]  Patient:    Benjamin Harrison, Benjamin Harrison                    MRN: [id number] Adm. Date:  [id number] Disc. Date: 03/27/00 Attending:  Cleatrice Burke Dictator:   Carlye Grippe. CC:         Alvia Grove., M.D.                           Discharge Summary  DATE OF BIRTH:  May 29, 1935  HISTORY OF PRESENT ILLNESS:  This is a 76 year old male referred to Dr. Laneta Simmers for consideration for coronary artery bypass grafting.  He is a 76 year old gentleman with a history of coronary artery disease, status post cardiac catheterization in November 1999.  Catheterization at that time showed essentially single vessel coronary disease with a 60-65% proximal left anterior descending stenosis and mild irregularities in the left circumflex and right coronary systems.  He continued on medical therapy and did quite well, continuing to work a job that required strenuous work.  For the past five or six months, however, he has noted worsening exertional chest pressure and shortness of breath, as well as decreased overall energy level.  He has continued to work the strenuous [employer] job but says that he has been ineffective at times.  He had no significant symptoms at rest.  The patient stated that when he had chest pressure it was moderately severe and lasted for several minutes and resolved with rest.  He is anxious to continue working at his job and is very active outside of work, but the symptoms are severely limiting.  He underwent repeat cardiac catheterization on March 16, 2000, which now showed three vessel coronary disease.  The LAD is now occluded proximally with filling of the mid and distal portions of the vessel by collaterals from the right coronary artery.  There is a large diagonal branch that has a 50-60% proximal stenosis.  The left circumflex has minor proximal luminal irregularities.  There is a 40-50% stenosis in the distal  left circumflex, after giving off a large marginal branch.  There is a small first marginal that has a 20-30% stenosis proximally.  The right coronary artery is a moderate-sized codominant vessel that has a diffuse 30-40% proximal stenosis.  Left ventricular ejection fraction is 40-50% with anterior and apical hypokinesis.  There was no mitral regurgitation.  The patient was admitted this hospitalization for elective coronary artery bypass grafting.  PAST MEDICAL HISTORY: 1. Hypertension. 2. Hypothyroidism. 3. Coronary artery disease, as described above.  PAST SURGICAL HISTORY: 1. Bilateral inguinal hernia repairs. 2. Appendectomy. 3. Status post left knee surgery a few months ago.  SOCIAL HISTORY, FAMILY HISTORY, REVIEW OF SYMPTOMS, PHYSICAL EXAMINATION: Please see the history and physical done at the time of admission.  ALLERGIES:  No known drug allergies.  HOSPITAL COURSE:  The patient was admitted electively and taken to the operating room where he underwent the following procedure:  Coronary artery bypass grafting.  The following grafts were placed: 1. Left internal mammary artery to the LAD. 2. Saphenous vein graft to the diagonal. 3. Saphenous vein graft to the posterolateral and left circumflex. 4. Saphenous vein graft to the right coronary artery.  Cross-clamp time was 47 minutes.  Total bypass time was 89 minutes.  The patient tolerated the procedure well and was taken to the surgical intensive care unit in stable  condition.  POSTOPERATIVE HOSPITAL COURSE:  The postoperative hospital course has been unremarkable.  The patient was extubated without difficulty and maintained stable hemodynamics throughout.  Routine lines and Swan-Ganz monitors were discontinued, as were chest tubes, without difficulty.  The patient has maintained normal sinus rhythm throughout the postoperative course.  The patient is afebrile.  Oxygen has been weaned, and he maintains  good saturations on room air.  He has undergone a gentle diuresis, responding well to low-dose Lasix.  The patients incisions are healing well without signs of infection.  The patient has tolerated a routine advancement in cardiac rehabilitation phase I modalities.  The patient is scheduled for cardiac rehabilitation phase II as an outpatient.  Laboratory values are all stable. The patient does have a minor postoperative anemia.  Most recent hemoglobin and hematocrit on March 25, 2000, are 10 and 28 respectively.  Electrolytes, BUN, and creatinine are all within normal limits.  CONDITION ON DISCHARGE:  Stable and improved.  DISCHARGE MEDICATIONS: 1. Aspirin 1 p.o. q.d. 2. Tylox 1 or 2 q.4-6h. p.r.n. 3. Lopressor 25 mg every 12 hours. 4. Synthroid 100 mcg q.d. 5. Folate 1 mg daily.  FINAL DIAGNOSES: 1. Coronary artery disease. 2. Hypothyroidism. 3. Hypertension. 4. Surgeries as previously outlined. 5. Postoperative anemia.  DISCHARGE INSTRUCTIONS:  The patient received written instructions in regard to medications, activity, diet, wound care, and follow-up.  FOLLOW-UP:  Dr. Elease Hashimoto in two weeks.  Dr. Laneta Simmers on April 17, 2000, at 9:45 a.m. DD:  03/26/00 TD:  03/27/00 Job: [id number]

## 2010-12-16 NOTE — Cardiovascular Report (Signed)
NAME:  Benjamin Harrison, Benjamin Harrison                       ACCOUNT NO.:  [id number]   MEDICAL RECORD NO.:  [id number]                   PATIENT TYPE:  OIB   LOCATION:  2899                                 FACILITY:  [facility]   PHYSICIAN:  Vesta Mixer, M.D.              DATE OF BIRTH:  [birth year]   DATE OF PROCEDURE:  01/11/2004  DATE OF DISCHARGE:  01/11/2004                              CARDIAC CATHETERIZATION   PROCEDURES PERFORMED:  1. Left heart catheterization with,  2. Coronary angiography.   CARDIOLOGIST:  Vesta Mixer, M.D.   INDICATIONS FOR SURGERY:  [name] is a 76 year old gentleman with a  history of coronary artery disease.  He is status post coronary artery  bypass grafting.  He was referred for heart catheterization after having  episodes of fatigue and shortness of breath.  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   DESCRIPTION OF PROCEDURE:  The right femoral artery was easily cannulated  using the modified Seldinger technique.   The left ventricular pressure was 115/10 with an aortic pressure of 112/76.   ANGIOGRAPHIC DATA:  Left Main:  The left main has a long 50-60% stenosis.   Left Anterior Descending:  The left anterior descending gives off several  small septal branches and is then occluded.   Left Circumflex Artery:  The left circumflex artery is a moderate-sized  vessel.  The proximal aspect of the vessel is fairly normal.  There are  perhaps some minor luminal irregularities.  This gives off the first obtuse  marginal artery, which also has minimal luminal irregularities, but is  otherwise normal.  The circumflex artery then gives off a small  posterolateral branch and is then occluded.   Right Coronary Artery:  The right coronary artery is small and it appears to  have diffuse disease.  The posterior descending artery is very small, but  otherwise there is no critical stenoses.  The posterolateral segment  artery  is also very small, but without discrete stenosis.   Grafts:  The saphenous vein graft to the right coronary artery is flush  occluded at the origin.  The saphenous vein graft to the first diagonal  vessel is a large vessel.  It is fairly normal.  The first diagonal vessel  is fairly small, but is normal.  The anastomosis is normal.  The saphenous  vein graft to the obtuse marginal artery is a very large graft.  There is  minimal luminal irregularity in the mid point of the graft between 20 and  25%.  The anastomosis to the obtuse marginal artery is normal.  The obtuse  marginal artery is fairly small.  The left internal mammary artery to the  LAD is a normal graft with a normal anastomosis.  The LAD is moderate in  size and is without critical stenosis.   VENTRICULOGRAPHIC DATA:  A left ventriculogram was performed  in the 30 RAO  position.  It revealed overall normal left ventricular systolic function.  The ejection fraction is about 60%.   COMPLICATIONS:  None.   CONCLUSIONS:  1. Severe native coronary artery disease.  2. There is an occlusion of the saphenous vein graft tot he right coronary     artery.  This is similar to his previous heart catheterization several     years ago.   We will continue with medical therapy.  I do not think that his current  symptoms are due to advancement of his coronary artery disease.                                               Vesta Mixer, M.D.    PJN/MEDQ  D:  01/11/2004  T:  01/11/2004  Job:  [id number]

## 2010-12-16 NOTE — H&P (Signed)
NAME:  Benjamin Harrison, Benjamin Harrison NO.:  [id number]   MEDICAL RECORD NO.:  [id number]                   PATIENT TYPE:  OIB   LOCATION:                                       FACILITY:  [facility]   PHYSICIAN:  Vesta Mixer, M.D.              DATE OF BIRTH:  02/14/1935   DATE OF ADMISSION:  01/11/2004  DATE OF DISCHARGE:                                HISTORY & PHYSICAL   HISTORY OF PRESENT ILLNESS:  Benjamin Harrison is a middle aged male with a history  of coronary artery disease and coronary artery bypass grafting.  He had a  heart catheterization in 2002, which revealed occlusion of the saphenous  vein graft to the right coronary artery.  The saphenous vein graft to the  second diagonal vessel was normal, and the saphenous vein graft to the left  circumflex/posterior lateral vessel was normal.  The LIMA to the LAD was  normal.   Approximately three weeks ago, Benjamin Harrison had a severe episode of chest  pain.  He had thought that he had probably had a small myocardial  infarction.  He took four nitroglycerin and the pain finally resolved.  He  did not call the office or come in for medical help.  It gradually felt  better over a couple of days.  Since that time, however, he has continued to  have intermittent episodes of chest pain every time he exerts himself.  Whenever he walks quickly he starts having this chest pain that radiates  through to his shoulder blades and between his shoulder blades.  He has to  stop and rest and this usually results in the resolution of these chest  pains.  The chest pains are associated with some nausea and a feeling of  indigestion.  He also has some significant shortness of breath.   CURRENT MEDICATIONS:  1. Lopressor 50 mg p.o. b.i.d.  2. Aspirin once a day.  3. Diovan 80 mg daily.  4. Zocor 20 mg daily.  5. Synthroid 0.1 mg daily.  6. Hydrochlorothiazide 25 mg daily.  7. Prevacid 30 mg daily.   ALLERGIES:  No known drug  allergies.   PAST MEDICAL HISTORY:  1. Coronary artery disease.  Status post coronary artery bypass grafting.  2. Hypothyroidism.  3. Hypertension.   SOCIAL HISTORY:  The patient is a nonsmoker and a nondrinker.   FAMILY HISTORY:  Noncontributory.   REVIEW OF SYSTEMS:  Was reviewed.  It is essentially negative except for as  noted in the HPI.   PHYSICAL EXAMINATION:  GENERAL:  He is an elderly gentleman in no acute  distress.  He is alert and oriented x3, and his mood and affect are normal.  VITAL SIGNS:  His weight is 188.  His blood pressure is 140/92, heart rate  of 80.  HEENT:  Reveals 2+ carotids, he has no bruits.  There is no JVD, no  thyromegaly.  LUNGS:  Clear to auscultation.  HEART:  Regular rate.  S1 and S2.  No murmurs, rubs, or gallops.  ABDOMEN:  Good bowel sounds and nontender.  EXTREMITIES:  He has no cyanosis, clubbing, or edema.  NEUROLOGIC:  Nonfocal.   LABORATORY DATA:  His EKG reveals normal sinus rhythm.  He has nonspecific  ST and T-wave abnormalities.   RECOMMENDATIONS:  Benjamin Harrison presents with symptoms that are consistent with  progressive angina following a severe episode of angina approximately three  weeks ago.  It is too late to measure any enzymes to see if he had a small  myocardial infarction.  I have recommended that we proceed with a heart  catheterization.  We have discussed the risks, benefits, and options of  heart catheterization.  He understands and agrees to proceed.  We will  continue with the same medications.                                                Vesta Mixer, M.D.    PJN/MEDQ  D:  01/06/2004  T:  01/07/2004  Job:  [id number]

## 2010-12-16 NOTE — H&P (Signed)
[place]. [facility]  Patient:    Benjamin Harrison, Benjamin Harrison                    MRN: [id number] Adm. Date:  [id number] Attending:  Koren Bound CC:         Dr. Windle Guard                         History and Physical  HISTORY OF PRESENT ILLNESS:  Mr. Lolli is a 76 year old gentleman with a history of hypertension and coronary artery disease, status post coronary artery bypass grafting in August 2001.  He also has a history of hypothyroidism.  He was admitted with a prolonged episode of chest pain this morning.  The patient has a history of coronary artery disease, status post coronary artery bypass grafting in August of last year.  He has done well until this morning, when he developed some chest pain and chest pressure.  This was associated with a headache, dyspnea, nausea, with some left arm pain.  He also developed some diaphoresis.  The patient took several nitroglycerin and felt perhaps a little bit better.  He was admitted to the hospital for further evaluation.  MEDICATIONS:  Lopressor 50 mg p.o. b.i.d., Synthroid 2.1 mg a day, enteric-coated aspirin 81 mg a day, Norvasc 5 mg a day.  He also may have been on verapamil 240 mg a day, although he is not sure.  ALLERGIES:  No known drug allergies.  PAST MEDICAL HISTORY: 1. Coronary artery disease, status post coronary artery bypass grafting. 2. Hypothyroidism. 3. Hypertension.  SOCIAL HISTORY:  The patient has a remote history of cigarette smoking.  FAMILY HISTORY:  His mother had heart disease.  REVIEW OF SYSTEMS:  His review of systems was reviewed and is essentially negative.  PHYSICAL EXAMINATION:  VITAL SIGNS:  His weight is 175.  His blood pressure is 110/70 with heart rate of 58.  GENERAL:  He is an elderly white gentleman in no acute distress.  NECK:  Carotids 2+.  There is no JVD.  He has no thyromegaly.  CHEST:  His lungs are clear to auscultation.  BACK:   Nontender.  CARDIAC:  Regular rate, S1, S2.  He has no rub.  ABDOMEN:  Good bowel sounds.  He has no hepatosplenomegaly, no masses, or bruits.  EXTREMITIES:  He has no clubbing, cyanosis, or edema.  His pulses are intact. His calves are nontender.  NEUROLOGIC:  Cranial nerves II-XII are intact, and his motor and sensory function are intact.  LABORATORY DATA:  His laboratory data is pending.  His EKG reveals sinus bradycardia.  He has inferior Q-waves, which are unchanged from his previous EKG.  He has no ST or T-wave changes.  IMPRESSION AND PLAN:  The patient presents with a history of coronary artery disease, hypertension, and now presents with some chest pains consistent with angina.  The symptoms are somewhat better with nitroglycerin.  He does not have any acute EKG changes, but I cannot completely exclude an episode of unstable angina.  We will admit him to the hospital and check cardiac enzymes.  We will need to at least consider heart catheterization, especially if his enzymes are positive.  Will continue with his other medications as prescribed. DD:  12/27/00 TD:  12/27/00 Job: [id number]

## 2010-12-16 NOTE — H&P (Signed)
NAME:  Benjamin Harrison, Benjamin Harrison                       ACCOUNT NO.:  [id number]   MEDICAL RECORD NO.:  [id number]                   PATIENT TYPE:  INP   LOCATION:  NA                                   FACILITY:  [facility]   PHYSICIAN:  John L. Rendall, M.D.               DATE OF BIRTH:  15-Feb-1935   DATE OF ADMISSION:  DATE OF DISCHARGE:                                HISTORY & PHYSICAL   ANTICIPATED DATE OF ADMISSION:  July 27, 2003.   CHIEF COMPLAINT:  Right knee pain.   HISTORY OF PRESENT ILLNESS:  The patient is a 77 year old white male with  right knee pains and arthritic problems for many years.  Over the last year,  his pains and problems have significantly increased.  He had an arthroscopic  evaluation with minimal improvement approximately six months ago.  Then he  had significant worsening of his pain and stiffness in the right knee over  the last several months.  The pain is primarily located in the medial joint  and it is sharp.  He does have locking sensation, he does occasionally have  pain at night.  The pain does occasionally radiate down into the calf.  The  pain does improve if he is off his feet for long periods of time.  He  occasionally does have swelling and he does have mechanical symptoms.   X-rays show virtual bone-on-bone medial compartment right knee.   ALLERGIES:  No known drug allergies.   CURRENT MEDICATIONS:  1. Prevacid 30 mg p.o. daily.  2. Levoxyl 100 mcg p.o. daily.  3. Hydrochlorothiazide 25 mg p.o. daily  4. Metoprolol 25 mg p.o. b.i.d.  5. Diovan 80 mg p.o. daily.  6. Zocor 20 mg p.o. daily.  7. NitroTab p.r.n.  8. Aspirin 325 mg p.o. daily.   PRESENT MEDICAL HISTORY:  1. Hypertension.  2. Coronary artery disease with history of MI and four-vessel bypass.  3. Hypothyroidism.   PAST SURGICAL HISTORY:  1. Cardiac bypass in 2000.  2. Knee arthroscopy in 1999 and 2004.  3. Hernia repair in 1985.  4. The patient denies any complications to the  above-mentioned surgical     procedures.   SOCIAL HISTORY:  The patient is a healthy-appearing, well-developed 68-year-  old white male.  Denies any history of smoking or alcohol use.  He is  currently married.  He has two grown children.  He lives in a [place]  house.  He is currently retired as a Engineer, maintenance.   FAMILY PHYSICIAN:  Windle Guard, M.D.   CARDIOLOGIST:  Vesta Mixer, M.D.   FAMILY MEDICAL HISTORY:  Mother is deceased from unknown causes but had a  history of hypertension.  Father is deceased from age-related issues at 40.  The patient has four brothers deceased, one from cardiac disease, one at  birth, one from unknown causes, and the fourth from pneumonia.  Two sisters  deceased, one from throat cancer, the second from age-related issues.  One  brother alive with a history of prostate cancer and cardiac disease.  Three  sisters alive and in good medical health.   REVIEW OF SYSTEMS:  Positive for upper and lower dentures.  He does wear  glasses.  He does have problems with exertional shortness of breath he  relates to his cardiac history and deconditioning.  He does average maybe  once every month or two with his NitroTab.  One or two tabs normally resolve  his chest discomfort.  Otherwise review of systems categories are negative.   PHYSICAL EXAMINATION:  VITAL SIGNS:  Height is 5 feet 6 inches, weight 178  pounds, pulse 88 and regular, respirations 12, blood pressure 128/86.  The  patient is afebrile.  GENERAL:  This is a healthy-appearing, well-developed white male.  He does  ambulate with a very slow gait and significant limp on the right side.  He  does have obvious difficulty with range of motion of his knee.  HEENT:  Head was normocephalic.  Pupils equal, round, and reacting to  accommodation to light.  Extraocular movements intact.  Sclerae are not  icteric.  Conjunctivae are pink and moist.  External ears without  deformities.  Gross  hearing is intact.  Nasal septum was midline.  Oral  buccal mucosa was pink and moist.  Upper and lower dentures were in place.  The patient is able to swallow without any difficulty.  NECK:  Supple, no palpable lymphadenopathy.  Thyroid region was nontender.  The patient had good range of motion of the cervical spine without and  difficulty or tenderness.  He was nontender to percussion along the entire  spinal column.  CHEST:  Lungs sounds were clear and equal bilaterally.  No wheezes, rales,  rhonchi, or rubs noted.  HEART:  The patient had a well-healed midline surgical incision.  Heart was  regular rate and rhythm.  No murmurs, rubs, or gallops noted.  ABDOMEN:  Soft, nontender.  Bowel sounds were normal and active throughout.  No hepatosplenomegaly.  CVA region was nontender.  EXTREMITIES:  Upper extremities were symmetrically sized and shaped.  He had  full range of motion of his shoulders, elbows, and wrists.  Motor strength  was 5/5.  Lower Extremities:  Right and left hip had full extension and  flexion up to 120 degrees with 20 degrees internal and external rotation  without and mechanical symptoms or discomfort.  Right knee was with any  signs of erythema.  He did have a pull up knee-appearing sleeve.  He was  significantly tender along the medial joint line minimally lateral.  He did  have about 5-10 degrees valgus varus laxity.  A range of motion was 10  degrees short of full extension, back flexion back to 105 degrees.  Left  knee was without any signs of erythema or ecchymosis, soft tissue swelling,  no palpable effusion, minimally tender along the mediolateral joint line.  No significant laxity.  He had full range of motion.  Calves were nontender.  Ankles were symmetrical with good dorsi/planter flexion.  PERIPHERAL VASCULATURE:  Carotid pulses were 2+.  No bruits.  Radial pulses  2+.  Dorsalis pedis and posterior tibial pulses were 1+.  He had no lower extremity edema  or venous stasis changes.  NEUROLOGICAL:  The patient was conscious, alert, and appropriate.  __________ and conversation examined.  Cranial nerves II-XII were grossly  intact.  The patient was  grossly intact to light touch sensation from head  to toe.  No gross neurologic defects noted.  BREASTS, RECTAL, AND GENITOURINARY:  Deferred at this time.   IMPRESSION:  1. End-stage osteoarthritis right knee with bone-on-bone medial compartment.  2. Coronary artery disease with history of myocardial infarction and four-     vessel bypass.  3. Hypertension.  4. Thyroid disease.   PLAN:  The patient has been evaluated by Vesta Mixer, M.D. for  clearance for this upcoming surgery.  The patient will undergo a right total  knee arthroplasty by Jonny Ruiz L. Rendall, M.D. at [facility] on December 27th.  The patient will undergo all routine labs and tests prior to this procedure.      Jamelle Rushing, P.A.                      John L. Priscille Kluver, M.D.    RWK/MEDQ  D:  07/21/2003  T:  07/21/2003  Job:  [id number]

## 2010-12-16 NOTE — Op Note (Signed)
NAME:  Benjamin Harrison, Benjamin Harrison                       ACCOUNT NO.:  [id number]   MEDICAL RECORD NO.:  [id number]                   PATIENT TYPE:  INP   LOCATION:  NA                                   FACILITY:  [facility]   PHYSICIAN:  John L. Rendall III, M.D.           DATE OF BIRTH:  [birth year]   DATE OF PROCEDURE:  07/27/2003  DATE OF DISCHARGE:                                 OPERATIVE REPORT   PREOPERATIVE DIAGNOSIS:   POSTOPERATIVE DIAGNOSIS:   OPERATION PERFORMED:   SURGEON:   ASSISTANT:   ANESTHESIA:   COMPLICATIONS:   INDICATIONS FOR PROCEDURE:  Chronic right knee pain resistant to  arthroscopic debridement, no improvement.  Primarily medial joint symptoms  and patellofemoral.  Peeling hyaline cartilage was found on the  weightbearing medial femoral condyle and abrasion against the tibia and  significant abrasion on the undersurface of the patella.   DESCRIPTION OF PROCEDURE:  Under general anesthesia, the right leg was  prepared with DuraPrep and draped as a sterile field.  After routine prep  and drape, the sterile tourniquet was blown up to 350 after wrapping out the  leg with an Esmarch.  Skin incision was marked with marking pen.  A midline  incision was made.  The patella was everted. Femur was sized as a standard.  Using the proximal tibial guide, a proximal tibial resection was carried  out.  Using the first femoral guide, an intercondylar drill hole was placed.  Using the second femoral guide, anterior and posterior flare of the distal  femur were resected with a 12.5 flexion gap.  It was necessary to do a  medial release off the tibia to balance the compartments.  Following this,  the intramedullary guide and a distal femoral cut was made.  Fairly  significant minor bone was taken off to correct the deflexion deformity of  the knee preop.  The recessing guide was then used.  Following this, spurs  were taken off the back of the femoral condyle.  Lamina spreader  was  inserted.  Remnants of the menisci and cruciates were trimmed back to a  stable meniscal rim.  The tibia was then sized to a #3.  Center peg hole  with keel was then placed.  Trial reduction of a #3 tibia, 12.5 bearing and  standard femur revealed good fit, alignment and stability. The patella was  then osteotomized and standard three-peg hole patella was used.  This was  also an excellent fit.  Permanent components were obtained.  The knee was  prepared with pulse irrigation.  All components were cemented in place.  Cement hardened and excess was removed with osteotome and mallet. Tourniquet  was let down at one hour.  Multiple small vessels were cauterized.  The knee  was then closed in layers with #1 Tycron, #1 Vicryl, 2-0 Vicryl and skin  clips.  Operative time approximately one hour 20 minutes.  The patient  tolerated the procedure well and returned to recovery in good condition.                                              John L. Dorothyann Gibbs, M.D.   Renato Gails  D:  07/27/2003  T:  07/27/2003  Job:  [id number]

## 2010-12-16 NOTE — Discharge Summary (Signed)
NAME:  JT, BRABEC                       ACCOUNT NO.:  [id number]   MEDICAL RECORD NO.:  [id number]                   PATIENT TYPE:  INP   LOCATION:  5034                                 FACILITY:  [facility]   PHYSICIAN:  John L. Rendall, M.D.               DATE OF BIRTH:  1934-11-23   DATE OF ADMISSION:  07/27/2003  DATE OF DISCHARGE:  07/29/2003                                 DISCHARGE SUMMARY   ADMISSION DIAGNOSES:  1. End-stage osteoarthritis, right knee, medial compartment.  2. Coronary artery disease with history of myocardial infarction and four-     vessel bypass.  3. Hypertension.  4. Hypothyroidism.   DISCHARGE DIAGNOSES:  1. End-stage osteoarthritis, medial compartment of right knee, status post     right total knee arthroplasty.  2. Pruritus secondary to Dilaudid.  3. Coronary artery disease with history of myocardial infarction and four-     vessel bypass.  4. Hypertension.  5. Hypothyroidism.   SURGICAL PROCEDURE:  On July 27, 2003, Mr. Gravley underwent a right  total knee arthroplasty by Dr. Jonny Ruiz L. Rendall, assisted by Legrand Pitts. Duffy,  P.A.  He had a DePuy MBT keeled tibial tray, cemented, size 3, with an LCS  Complete RP insert, size standard, 12.5-mm thickness, an LCS Complete metal-  backed patella, cemented, size standard and an LCS Complete primary femoral  component, cemented, size standard right.   COMPLICATIONS:  None.   CONSULTS:  Physical therapy and rehab medicine consult, July 28, 2003.   HISTORY OF PRESENT ILLNESS:  This 76 year old white male patient presented  to Dr. Jonny Ruiz L. Rendall with a multiple-year history of problems with his  right knee.  Over the last year, it has gotten remarkably worse.  The pain  is located over the medial joint line and is very sharp.  The knee feels  like it locks at times and then keeps him up at night.  The pain does  radiate into his calf at times.  He has failed conservative treatment and x-  rays  have shown end-stage osteoarthritis of the knee and because of that, he  is presenting for a right knee replacement.   HOSPITAL COURSE:  Mr. Stiff tolerated his surgical procedure well without  immediate postoperative complications.  He was subsequently transferred to  5000.  On postop day 1, he had had some problems with itching the night  before with the Dilaudid PCA and it was switched with morphine; that  controlled his pain well.  He was afebrile, vitals stable, leg was  neurovascularly intact.  He was started on therapy per protocol.   On postoperative day 2, he was doing much better.  He was switched to p.o.  pain medications and pain was controlled with that.  He was continued on  therapy.  A bed became available on rehab and he was transferred to rehab  later that day.   DISCHARGE INSTRUCTIONS:  DIET:  Continue his current hospitalization diet with adjustments to be made  per the rehab physicians.   ACTIVITY:  He is to be out of bed weightbearing as tolerated, right leg,  with the use of a walker.  He is to continue PT and OT per rehab protocols.   MEDICATIONS:  He is to continue his current hospitalization medications with  adjustments to be made per the rehab physicians.   WOUND CARE:  Please keep the right knee incision clean and dry and clean it  with Betadine daily and apply a dry dressing.  Postop day 14, staples can be  removed with Steri-Strips with Benzoin applied; this can be done in rehab if  he is still there, otherwise, he needs followup with Dr. Priscille Kluver about that  time for staple removal and first postop check.   FOLLOWUP:  He needs to follow up with Dr. Priscille Kluver in our office about a week  after discharge from rehab if his staples are removed while in rehab.  Otherwise, he needs to follow up with Dr. Priscille Kluver in our office on about  postop day 14.   LABORATORY DATA:  On July 28, 2003, white count 9.8, hemoglobin 13.9,  hematocrit 40.1 and platelets  242,000.  On July 22, 2003, glucose 134;  on the 28th, it was 168.  ALT was 58 on July 22, 2003.  All other  laboratory studies were within normal limits.      Legrand Pitts Duffy, P.A.                      John L. Priscille Kluver, M.D.    KED/MEDQ  D:  09/02/2003  T:  09/03/2003  Job:  [id number]

## 2010-12-16 NOTE — Discharge Summary (Signed)
NAME:  Benjamin Harrison, Benjamin Harrison                       ACCOUNT NO.:  [id number]   MEDICAL RECORD NO.:  [id number]                   PATIENT TYPE:  IPS   LOCATION:  4140                                 FACILITY:  [facility]   PHYSICIAN:  Ellwood Dense, M.D.                DATE OF BIRTH:  01-30-1935   DATE OF ADMISSION:  07/29/2003  DATE OF DISCHARGE:  08/06/2003                                 DISCHARGE SUMMARY   DISCHARGE DIAGNOSES:  1. Right total knee replacement.  2. Hypoglycemia.  3. Postoperative anemia.  4. Coronary artery disease.   HISTORY OF PRESENT ILLNESS:  [name] is a 76 year old male with history  of hypertension, coronary artery disease, right knee pain secondary to end-  stage OA.  Elected to undergo right total knee replacement December 29 by  Dr. Priscille Kluver.  Postoperative he was weightbearing as tolerated, on Arixtra  for DVT prophylaxis.  Physical therapy initiated and patient is Harrison assist  for transfers, Harrison assist ambulating 40 feet with a rolling walker requiring  cues for technique.  Knee flexion ________ 80 degrees.   PAST MEDICAL HISTORY:  1. Hypertension.  2. Coronary artery disease status post CABG with continued occasional     angina.  3. Bilateral knee scope.  4. Hernia repair.  5. GERD.  6. Hypothyroidism.  7. Dyslipidemia.  8. Nocturia.   ALLERGIES:  CODEINE.   SOCIAL HISTORY:  The patient is married.  Lives in one level home with one  step to entry.  Was independent prior to admission.  He has not used any  tobacco since 1967.  Does not use any alcohol.   HOSPITAL COURSE:  [name] was admitted to rehabilitation on  July 29, 2003 for inpatient therapy to consist of PT/OT daily.  Past  admission patient was maintained on subcutaneous Arixtra for DVT prophylaxis  throughout his rehabilitation stay.  GERD symptoms were  managed with use of  Prilosec.  Laboratories done past admission showed H&H at 12.7 and 36.3.  The patient was  maintained on iron supplements for postoperative anemia.  Check of lytes showed some mild hypokalemia with potassium at 3.  This was  supplemented and recheck of January 3 shows sodium 135, potassium 4.9,  chloride 97, CO2 31, BUN 20, creatinine 0.9, glucose 155.  The patient has  been noted to have some high early a.m. blood sugars ranging from 150s-160s  range.  Therefore, hemoglobin A1C was done and was noted to be at 6.4.  CBGs  were checked on a.c./h.s. basis initially and were noted to be ranging from  150s-190s range consistently.  The patient was started on Amaryl 1 mg daily  and at time of discharge blood sugars are at 110-160s range.  The patient  has been advised regarding carbohydrate modified diet and is to follow up  with Dr. Jeannetta Nap in next two to three weeks for recheck hemoglobin A1C as  well as blood sugars and further adjustment of medication.   The patient's right knee incision has healed well without any signs or  symptoms of infection.  No drainage or erythema noted.  Staples were  discontinued and Steri-Strips placed on postoperative day 10 without  difficulty.  He has had some issues with postoperative edema at the knee and  kinesiotaping was undertaken with improvement in his symptomatology.  During  his stay in rehab, [name] has been motivated and has made great  progress.  By time of discharge patient was independent for bed mobility,  modified independent for transfers, modified independent ambulating 150 feet  with rolling walker.  He was able to navigate four stairs with Harrison assist  and cueing.  Knee flexion was at -8 to 90 degrees.  The patient was modified  independent for ADLs including toileting and hygiene.  He will continue to  receive follow-up home health PT/OT by [facility].  On  August 06, 2003 patient is discharged to home.   DISCHARGE MEDICATIONS:  1. Coated aspirin 325 mg daily.  2. Amaryl 1 mg daily.  3. Prevacid 30 mg  daily.  4. Levothyroxine 100 mcg daily.  5. Lopressor 25 mg daily.  6. Hydrochlorothiazide 25 mg daily.  7. Diovan 60 mg daily.  8. Robaxin 500 mg q.i.d. p.r.n. spasm.  9. Oxycodone IR 5-10 mg q.4-6h. p.r.n. pain.  10.      Zocor 20 mg q.h.s.   ACTIVITY:  Continue using walker.   WOUND CARE:  Keep area clean and dry.   DIET:  Diabetic diet.   SPECIAL INSTRUCTIONS:  No alcohol, no smoking, no driving.   FOLLOWUP:  The patient to follow up with Dr. Priscille Kluver and Dr. Jeannetta Nap in two  to three weeks for postoperative check.  Follow up with Dr. Ellwood Dense  as needed.      Greg Cutter, P.A.                    Ellwood Dense, M.D.    PP/MEDQ  D:  08/06/2003  T:  08/07/2003  Job:  [id number]   cc:   Windle Guard, M.D.  [address]  [place], [place] [address]  Fax: [phone]   Carlisle Beers. Rendall III, M.D.  [address] [place]  [place] [address]  Fax: [phone]

## 2011-02-17 ENCOUNTER — Encounter: Payer: Self-pay | Admitting: Cardiovascular Disease

## 2011-02-17 ENCOUNTER — Ambulatory Visit (INDEPENDENT_AMBULATORY_CARE_PROVIDER_SITE_OTHER): Payer: Medicare Other | Admitting: Cardiovascular Disease

## 2011-02-17 VITALS — BP 120/70 | HR 60 | Ht 66.0 in | Wt 177.0 lb

## 2011-02-17 DIAGNOSIS — I10 Essential (primary) hypertension: Secondary | ICD-10-CM

## 2011-02-17 DIAGNOSIS — I2510 Atherosclerotic heart disease of native coronary artery without angina pectoris: Secondary | ICD-10-CM

## 2011-02-17 NOTE — Progress Notes (Signed)
Benjamin Harrison Date of Birth  [birth year] [facility] / [facility] [address]     [address] [place], [place]  [address] [phone]  Fax  [phone]  History of Present Illness:  Benjamin Harrison is a 76 year old gentleman with a history of coronary artery disease. He status post PTCA and stenting of his right coronary. He had repeat stenting of his right coronary artery as recently as March of this year.  He also status post coronary artery bypass grafting. Also has a history of hypertension, hyperlipidemia, and hypothyroidism.  He had one episode of chest pain since I last saw him. The episode of chest pain that lasted all-night and then he felt poorly for a week. It is described as a deep heavy pressure. He tried to pop out of position but did not get any relief. He did not come to the hospital or call us. He eventually felt better. He's not had any recurrent episodes of pain since that time. He denies any syncope or presyncope. He denies the shortness of breath.   Current Outpatient Prescriptions on File Prior to Visit  Medication Sig Dispense Refill  . aspirin 325 MG tablet Take 325 mg by mouth daily.        . clopidogrel (PLAVIX) 75 MG tablet Take 75 mg by mouth daily.        . famotidine (PEPCID) 20 MG tablet Take 1 tablet (20 mg total) by mouth daily.  30 tablet    . glimepiride (AMARYL) 2 MG tablet Take 2 mg by mouth daily before breakfast.        . hydrochlorothiazide 25 MG tablet Take 25 mg by mouth daily.        Marland Kitchen levothyroxine (SYNTHROID, LEVOTHROID) 88 MCG tablet Take 88 mcg by mouth daily.        . metoprolol (LOPRESSOR) 50 MG tablet Take 25 mg by mouth 2 (two) times daily.        . nitroGLYCERIN (NITROSTAT) 0.4 MG SL tablet Place 0.4 mg under the tongue every 5 (five) minutes as needed.        . rosuvastatin (CRESTOR) 20 MG tablet Take 1 tablet (20 mg total) by mouth at bedtime.  30 tablet    . valsartan (DIOVAN) 160 MG tablet Take 1 tablet (160  mg total) by mouth daily.  30 tablet  11    Allergies  Allergen Reactions  . Ace Inhibitors     Past Medical History  Diagnosis Date  . Coronary artery disease     STATUS POST INFERIOR WALL M.I.  . MI (myocardial infarction)     STATUS POST PTC AND STENTING OF HIS MID RCA  . Hypertension   . Hypothyroidism   . Hyperlipidemia   . Chest pain     HEAVINESS  . SOB (shortness of breath) on exertion   . Personal history of tobacco use     REMOTE IN THE PAST    Past Surgical History  Procedure Date  . Coronary artery bypass graft   . Cardiac catheterization     History  Smoking status  . Former Smoker  . Quit date: 08/01/1975  Smokeless tobacco  . Not on file    History  Alcohol Use No    No family history on file.  Reviw of Systems:  Reviewed in the HPI.  All other systems are negative.  Physical Exam: BP 120/70  Pulse 60  Ht 5\' 6"  (1.676 m)  Wt 177 lb (80.287  kg)  BMI 28.57 kg/m2 The patient is alert and oriented x 3.  The mood and affect are normal.   Skin: warm and dry.  Color is normal.    HEENT:   the sclera are nonicteric.  The mucous membranes are moist.  The carotids are 2+ without bruits.  There is no thyromegaly.  There is no JVD.    Lungs: clear.  The chest wall is non tender.    Heart: regular rate with a normal S1 and S2.  There are no murmurs, gallops, or rubs. The PMI is not displaced.     Abdomin: good bowel sounds.  There is no guarding or rebound.  There is no hepatosplenomegaly or tenderness.  There are no masses.   Extremities:  no clubbing, cyanosis, or edema.  The legs are without rashes.  The distal pulses are intact.   Neuro:  Cranial nerves II - XII are intact.  Motor and sensory functions are intact.    The gait is normal.  ECG:  Assessment / Plan:

## 2011-02-17 NOTE — Assessment & Plan Note (Signed)
[name] has a history of PTCA and stenting of his right coronary artery and in fact had a severe episode of chest pain  the past month or so. He was uncomfortable for about a week and the pain gradually subsided. It is quite possible that this pain was due to a heart attack due to an occlusion of a small vessel. He is completely asymptomatic at this point I do not think that we need to pursue cardiac catheterization. I asked him to call if he has any recurrent episodes of chest pain.

## 2011-02-17 NOTE — Assessment & Plan Note (Signed)
His blood pressure remains well controlled. 

## 2011-03-27 ENCOUNTER — Other Ambulatory Visit: Payer: Self-pay | Admitting: *Deleted

## 2011-03-27 MED ORDER — HYDROCHLOROTHIAZIDE 25 MG PO TABS: 25.0000 mg | ORAL_TABLET | Freq: Every day | ORAL | Status: DC

## 2011-03-27 NOTE — Telephone Encounter (Signed)
Fax received from pharmacy. Refill completed. Jodette Hong Timm RN  

## 2011-05-05 ENCOUNTER — Other Ambulatory Visit: Payer: Self-pay | Admitting: Cardiovascular Disease

## 2011-07-04 ENCOUNTER — Other Ambulatory Visit: Payer: Self-pay

## 2011-07-04 ENCOUNTER — Encounter (HOSPITAL_COMMUNITY): Payer: Self-pay | Admitting: Emergency Medicine

## 2011-07-04 ENCOUNTER — Emergency Department (HOSPITAL_COMMUNITY): Payer: Medicare Other

## 2011-07-04 ENCOUNTER — Inpatient Hospital Stay (HOSPITAL_COMMUNITY)
Admission: EM | Admit: 2011-07-04 | Discharge: 2011-07-09 | DRG: 392 | Disposition: A | Discharge status: Home / Self Care | Payer: Medicare Other | Attending: Internal Medicine | Admitting: Internal Medicine

## 2011-07-04 ENCOUNTER — Emergency Department (INDEPENDENT_AMBULATORY_CARE_PROVIDER_SITE_OTHER)
Admission: EM | Admit: 2011-07-04 | Discharge: 2011-07-04 | Disposition: A | Discharge status: Nursing Home (Includes ICF) | Payer: Medicare Other | Source: Home / Self Care | Attending: Family Medicine | Admitting: Family Medicine

## 2011-07-04 DIAGNOSIS — I10 Essential (primary) hypertension: Secondary | ICD-10-CM | POA: Diagnosis present

## 2011-07-04 DIAGNOSIS — R5383 Other fatigue: Secondary | ICD-10-CM

## 2011-07-04 DIAGNOSIS — R531 Weakness: Secondary | ICD-10-CM

## 2011-07-04 DIAGNOSIS — E785 Hyperlipidemia, unspecified: Secondary | ICD-10-CM | POA: Diagnosis present

## 2011-07-04 DIAGNOSIS — I219 Acute myocardial infarction, unspecified: Secondary | ICD-10-CM

## 2011-07-04 DIAGNOSIS — R0902 Hypoxemia: Secondary | ICD-10-CM

## 2011-07-04 DIAGNOSIS — J111 Influenza due to unidentified influenza virus with other respiratory manifestations: Secondary | ICD-10-CM | POA: Diagnosis present

## 2011-07-04 DIAGNOSIS — R079 Chest pain, unspecified: Secondary | ICD-10-CM

## 2011-07-04 DIAGNOSIS — Z7982 Long term (current) use of aspirin: Secondary | ICD-10-CM

## 2011-07-04 DIAGNOSIS — Z951 Presence of aortocoronary bypass graft: Secondary | ICD-10-CM

## 2011-07-04 DIAGNOSIS — Z87891 Personal history of nicotine dependence: Secondary | ICD-10-CM

## 2011-07-04 DIAGNOSIS — E119 Type 2 diabetes mellitus without complications: Secondary | ICD-10-CM | POA: Diagnosis present

## 2011-07-04 DIAGNOSIS — E876 Hypokalemia: Secondary | ICD-10-CM | POA: Diagnosis not present

## 2011-07-04 DIAGNOSIS — I2510 Atherosclerotic heart disease of native coronary artery without angina pectoris: Secondary | ICD-10-CM

## 2011-07-04 DIAGNOSIS — K529 Noninfective gastroenteritis and colitis, unspecified: Principal | ICD-10-CM | POA: Diagnosis present

## 2011-07-04 DIAGNOSIS — R000 Tachycardia, unspecified: Secondary | ICD-10-CM

## 2011-07-04 DIAGNOSIS — R197 Diarrhea, unspecified: Secondary | ICD-10-CM | POA: Diagnosis present

## 2011-07-04 DIAGNOSIS — E039 Hypothyroidism, unspecified: Secondary | ICD-10-CM | POA: Diagnosis present

## 2011-07-04 DIAGNOSIS — Z7902 Long term (current) use of antithrombotics/antiplatelets: Secondary | ICD-10-CM

## 2011-07-04 DIAGNOSIS — R0602 Shortness of breath: Secondary | ICD-10-CM

## 2011-07-04 LAB — BASIC METABOLIC PANEL
BUN: 27 mg/dL — ABNORMAL HIGH (ref 6–23)
CO2: 26 mEq/L (ref 19–32)
Calcium: 9.7 mg/dL (ref 8.4–10.5)
GFR calc non Af Amer: 63 mL/min — ABNORMAL LOW (ref >90)
Glucose, Bld: 217 mg/dL — ABNORMAL HIGH (ref 70–99)

## 2011-07-04 LAB — DIFFERENTIAL
Eosinophils Absolute: 0 10*3/uL (ref 0.0–0.7)
Eosinophils Relative: 0 % (ref 0–5)
Lymphocytes Relative: 3 % — ABNORMAL LOW (ref 12–46)
Lymphs Abs: 0.4 10*3/uL — ABNORMAL LOW (ref 0.7–4.0)
Monocytes Relative: 11 % (ref 3–12)
Neutro Abs: 12.2 10*3/uL — ABNORMAL HIGH (ref 1.7–7.7)
Neutrophils Relative %: 86 % — ABNORMAL HIGH (ref 43–77)

## 2011-07-04 LAB — CBC
HCT: 43 % (ref 39.0–52.0)
Hemoglobin: 15.4 g/dL (ref 13.0–17.0)
MCH: 26.8 pg (ref 26.0–34.0)
MCV: 74.8 fL — ABNORMAL LOW (ref 78.0–100.0)
RBC: 5.75 MIL/uL (ref 4.22–5.81)
WBC: 14.1 10*3/uL — ABNORMAL HIGH (ref 4.0–10.5)

## 2011-07-04 LAB — D-DIMER, QUANTITATIVE: D-Dimer, Quant: 0.83 ug/mL-FEU — ABNORMAL HIGH (ref 0.00–0.48)

## 2011-07-04 MED ORDER — MORPHINE SULFATE 4 MG/ML IJ SOLN
4.0000 mg | Freq: Once | INTRAMUSCULAR | Status: AC
  Administered 2011-07-04: 4 mg via INTRAVENOUS
  Filled 2011-07-04: qty 1

## 2011-07-04 MED ORDER — SODIUM CHLORIDE 0.9 % IV BOLUS (SEPSIS)
500.0000 mL | Freq: Once | INTRAVENOUS | Status: AC
  Administered 2011-07-04: 1000 mL via INTRAVENOUS

## 2011-07-04 MED ORDER — IOHEXOL 350 MG/ML SOLN
100.0000 mL | Freq: Once | INTRAVENOUS | Status: AC | PRN
  Administered 2011-07-04: 100 mL via INTRAVENOUS

## 2011-07-04 MED ORDER — SODIUM CHLORIDE 0.9 % IV SOLN
INTRAVENOUS | Status: DC
  Administered 2011-07-04: 15:00:00 via INTRAVENOUS

## 2011-07-04 MED ORDER — SODIUM CHLORIDE 0.9 % IV SOLN
Freq: Once | INTRAVENOUS | Status: AC
  Administered 2011-07-05: 01:00:00 via INTRAVENOUS

## 2011-07-04 MED ORDER — ACETAMINOPHEN 325 MG PO TABS
650.0000 mg | ORAL_TABLET | Freq: Once | ORAL | Status: AC
  Administered 2011-07-04: 650 mg via ORAL
  Filled 2011-07-04: qty 1
  Filled 2011-07-04: qty 2

## 2011-07-04 MED ORDER — ONDANSETRON HCL 4 MG/2ML IJ SOLN
4.0000 mg | Freq: Once | INTRAMUSCULAR | Status: AC
  Administered 2011-07-04: 4 mg via INTRAMUSCULAR
  Filled 2011-07-04: qty 2

## 2011-07-04 MED ORDER — ONDANSETRON HCL 4 MG/2ML IJ SOLN
INTRAMUSCULAR | Status: AC
  Administered 2011-07-04: 4 mg
  Filled 2011-07-04: qty 2

## 2011-07-04 MED ORDER — ASPIRIN 81 MG PO CHEW
324.0000 mg | CHEWABLE_TABLET | Freq: Once | ORAL | Status: AC
  Administered 2011-07-04: 324 mg via ORAL
  Filled 2011-07-04: qty 4

## 2011-07-04 NOTE — ED Notes (Signed)
Pt with nausea this morning, has been around grandchildren who have rsv.  States feeling bad, SOB with exertion, HA, fever  and lethargy.  Also having intermittent chest pains that are self resolving.

## 2011-07-04 NOTE — ED Notes (Signed)
Patients ekg was given to dr. Preston Fleeting.  Old ones was found in muse and given to dr. Preston Fleeting also.

## 2011-07-04 NOTE — ED Provider Notes (Addendum)
History     CSN: [id number] Arrival date & time: 07/04/2011  1:56 PM   First MD Initiated Contact with Patient 07/04/11 1425      No chief complaint on file.   (Consider location/radiation/quality/duration/timing/severity/associated sxs/prior treatment) HPI Comments: Benjamin Harrison presents for evaluation of nausea, weakness, chest pain, and joint pain, with fever to 101 F since last night around 2 am. He also reports shortness of breath. He has a hx of quadruple CABG, multiple stents, and several heart attacks.  Patient is a 76 y.o. male presenting with weakness. The history is provided by the patient and the spouse.  Weakness The primary symptoms include fever, nausea and vomiting. The symptoms began 12 to 24 hours ago.  Additional symptoms include weakness. Medical issues also include hypertension.    Past Medical History  Diagnosis Date  . Coronary artery disease     STATUS POST INFERIOR WALL M.I.  . MI (myocardial infarction)     STATUS POST PTC AND STENTING OF HIS MID RCA  . Hypertension   . Hypothyroidism   . Hyperlipidemia   . Chest pain     HEAVINESS  . SOB (shortness of breath) on exertion   . Personal history of tobacco use     REMOTE IN THE PAST    Past Surgical History  Procedure Date  . Coronary artery bypass graft   . Cardiac catheterization     No family history on file.  History  Substance Use Topics  . Smoking status: Former Smoker    Quit date: 08/01/1975  . Smokeless tobacco: Not on file  . Alcohol Use: No      Review of Systems  Constitutional: Positive for fever.  Eyes: Negative.   Respiratory: Positive for shortness of breath.   Cardiovascular: Positive for chest pain.  Gastrointestinal: Positive for nausea and vomiting.  Genitourinary: Negative.   Musculoskeletal: Positive for myalgias and arthralgias.  Skin: Negative.   Neurological: Positive for weakness.    Allergies  Ace inhibitors  Home Medications   Current Outpatient Rx    Name Route Sig Dispense Refill  . ASPIRIN 325 MG PO TABS Oral Take 325 mg by mouth daily.      Marland Kitchen CLOPIDOGREL BISULFATE 75 MG PO TABS  TAKE 1 TABLET BY MOUTH EVERY DAY 30 tablet 5  . FAMOTIDINE 20 MG PO TABS Oral Take 1 tablet (20 mg total) by mouth daily. 30 tablet   . GLIMEPIRIDE 2 MG PO TABS Oral Take 2 mg by mouth daily before breakfast.      . HYDROCHLOROTHIAZIDE 25 MG PO TABS Oral Take 1 tablet (25 mg total) by mouth daily. 30 tablet 5  . LEVOTHYROXINE SODIUM 88 MCG PO TABS Oral Take 88 mcg by mouth daily.      Marland Kitchen METOPROLOL TARTRATE 50 MG PO TABS Oral Take 25 mg by mouth 2 (two) times daily.      Marland Kitchen NITROGLYCERIN 0.4 MG SL SUBL Sublingual Place 0.4 mg under the tongue every 5 (five) minutes as needed.      Marland Kitchen ROSUVASTATIN CALCIUM 20 MG PO TABS Oral Take 1 tablet (20 mg total) by mouth at bedtime. 30 tablet   . VALSARTAN 160 MG PO TABS Oral Take 1 tablet (160 mg total) by mouth daily. 30 tablet 11    BP 152/91  Pulse 103  Temp(Src) 99.3 F (37.4 C) (Oral)  Resp 26  SpO2 95%  Physical Exam  Nursing note and vitals reviewed. Constitutional: He is oriented to person, place,  and time. He appears well-developed and well-nourished.  HENT:  Head: Normocephalic and atraumatic.  Eyes: EOM are normal. Pupils are equal, round, and reactive to light.  Neck: Normal range of motion.  Cardiovascular: Regular rhythm.  Tachycardia present.   Murmur heard.  Systolic murmur is present with a grade of 4/6  Pulmonary/Chest: Effort normal.  Musculoskeletal: Normal range of motion.  Neurological: He is alert and oriented to person, place, and time.  Skin: Skin is warm and dry.  Psychiatric: His behavior is normal.    ED Course  Procedures (including critical care time)  Labs Reviewed - No data to display No results found.   No diagnosis found.    MDM  Transferred to ED for further evaluation. ECG sinus tachycardia with PACs, rate 107        Richardo Priest, MD 07/04/11  1432  Richardo Priest, MD 07/04/11 1434  Richardo Priest, MD 07/04/11 1452

## 2011-07-04 NOTE — ED Notes (Signed)
ekg was done at 15:31pm and given to dr. Preston Fleeting.

## 2011-07-04 NOTE — ED Provider Notes (Signed)
History     CSN: [id number] Arrival date & time: 07/04/2011  3:23 PM   First MD Initiated Contact with Patient 07/04/11 1524      Chief Complaint  Patient presents with  . Chest Pain    refused nitro   . Nausea  . URI    (Consider location/radiation/quality/duration/timing/severity/associated sxs/prior treatment) Patient is a 76 y.o. male presenting with chest pain and URI. The history is provided by the patient.  Chest Pain    URI   he was doing well until 2 AM when he started having dyspnea and an achy pain in his mid and left chest. Pain was rated at 4/10, and was worse with deep breathing. It was not worse with movement. There has been associated fever to 100.3, chills, sweats. He has also had nausea and vomiting and diarrhea as well as arthralgias and myalgias and a headache. In the last 2 days he has had sick contacts with grandchildren who have been diagnosed with RSV. He has not taken anything to try and help his pain or dyspnea. Symptoms are described as severe.  Past Medical History  Diagnosis Date  . Coronary artery disease     STATUS POST INFERIOR WALL M.I.  . MI (myocardial infarction)     STATUS POST PTC AND STENTING OF HIS MID RCA  . Hypertension   . Hypothyroidism   . Hyperlipidemia   . Chest pain     HEAVINESS  . SOB (shortness of breath) on exertion   . Personal history of tobacco use     REMOTE IN THE PAST  . Diabetes mellitus     Past Surgical History  Procedure Date  . Coronary artery bypass graft   . Cardiac catheterization     No family history on file.  History  Substance Use Topics  . Smoking status: Former Smoker    Quit date: 08/01/1975  . Smokeless tobacco: Not on file  . Alcohol Use: No      Review of Systems  Cardiovascular: Positive for chest pain.  All other systems reviewed and are negative.    Allergies  Ace inhibitors  Home Medications   Current Outpatient Rx  Name Route Sig Dispense Refill  . ASPIRIN 325 MG  PO TABS Oral Take 325 mg by mouth daily.      Marland Kitchen CLOPIDOGREL BISULFATE 75 MG PO TABS  TAKE 1 TABLET BY MOUTH EVERY DAY 30 tablet 5  . FAMOTIDINE 20 MG PO TABS Oral Take 1 tablet (20 mg total) by mouth daily. 30 tablet   . GLIMEPIRIDE 2 MG PO TABS Oral Take 2 mg by mouth daily before breakfast.      . HYDROCHLOROTHIAZIDE 25 MG PO TABS Oral Take 1 tablet (25 mg total) by mouth daily. 30 tablet 5  . LEVOTHYROXINE SODIUM 88 MCG PO TABS Oral Take 88 mcg by mouth daily.      Marland Kitchen METOPROLOL TARTRATE 50 MG PO TABS Oral Take 25 mg by mouth 2 (two) times daily.      Marland Kitchen NITROGLYCERIN 0.4 MG SL SUBL Sublingual Place 0.4 mg under the tongue every 5 (five) minutes as needed. For chest pain.    Marland Kitchen ROSUVASTATIN CALCIUM 20 MG PO TABS Oral Take 1 tablet (20 mg total) by mouth at bedtime. 30 tablet   . VALSARTAN 160 MG PO TABS Oral Take 1 tablet (160 mg total) by mouth daily. 30 tablet 11    BP 150/83  Pulse 111  Temp(Src) 98.7 F (37.1 C) (  Oral)  Resp 23  SpO2 97%  Physical Exam  Nursing note and vitals reviewed.  76 year old male who appears dyspneic. Vital signs significant for heart rate of 103 and respiratory rate of 26 blood pressure 152/91. Oxygen saturation is a satisfactory 95%. He is to get pregnant not using accessory muscles of respiration. Head is normocephalic and atraumatic. PERRLA, EOMI. Oropharynx is clear. Neck is supple without adenopathy, JVD, or bruit. Back is nontender and there is no CVA tenderness. Lungs have fine, dry sounding rales at the left base. No wheezes or rhonchi. Air movement is symmetric. There is no chest wall tenderness. Heart has regular rate and rhythm without murmur. Abdomen is soft, flat, nontender without masses or hepatosplenomegaly. Extremities have 1+ edema, no cyanosis. There is full range of motion present. Neurologic: Mental status is normal, cranial nerves are intact, there are no motor or sensory deficits.   ED Course  Procedures (including critical care  time)  Labs Reviewed - No data to display No results found.   No diagnosis found.   Date: 07/04/2011  Rate: 107  Rhythm: sinus tachycardia and premature atrial contractions (PAC)  QRS Axis: normal  Intervals: normal  ST/T Wave abnormalities: normal  Conduction Disutrbances:none  Narrative Interpretation: Sinus tachycardia with PACs, unchanged from ECG of 10/18/2010.  Old EKG Reviewed: unchanged  He was given morphine and Zofran for his complaints of headache. Is given acetaminophen for fever. He was given IV fluids. Following IV fluids, his heart rate continued to stay in the range of 115 to 120. He was taken off of nasal oxygen, and oxygen saturation dropped to 90%. Insight of no infiltrate seen on x-ray and laboratory workup remarkable only for mild leukocytosis and mild elevation of BUN, I do not feel that he is well enough to go home. He need supplemental oxygen and additional of IV fluids. At this point, I am waiting for [facility] hospitalists to call back. MDM  Fever, chills, respiratory and GI symptoms most likely influenza. He will need to be evaluated for possible cardiac cause of chest pain and dyspnea as well as need to rule out pulmonary embolism.        Dione Booze, MD 07/05/11 [phone]

## 2011-07-04 NOTE — ED Notes (Signed)
Did not take any ntg at home.  Reports he doesn't like taking medicine.  Marland Kitchen

## 2011-07-04 NOTE — ED Notes (Signed)
Notified [facility] 

## 2011-07-04 NOTE — ED Notes (Signed)
Patients vitals were taken upon arrival  15:25pm.

## 2011-07-04 NOTE — ED Notes (Signed)
Patient continuing with headache and nausea.  MD aware.  New orders per MD.

## 2011-07-04 NOTE — ED Notes (Signed)
Late entry.  Monitor placed on arrival to [facility].  ekg completed by livia, emt.  o2 at 2 lnc placed on patient.  Patient allowed ice chips by dr Juanetta Gosling.

## 2011-07-05 ENCOUNTER — Encounter (HOSPITAL_COMMUNITY): Payer: Self-pay | Admitting: Internal Medicine

## 2011-07-05 ENCOUNTER — Inpatient Hospital Stay (HOSPITAL_COMMUNITY): Payer: Medicare Other

## 2011-07-05 DIAGNOSIS — R531 Weakness: Secondary | ICD-10-CM | POA: Diagnosis present

## 2011-07-05 DIAGNOSIS — R197 Diarrhea, unspecified: Secondary | ICD-10-CM | POA: Diagnosis present

## 2011-07-05 DIAGNOSIS — J111 Influenza due to unidentified influenza virus with other respiratory manifestations: Secondary | ICD-10-CM | POA: Diagnosis present

## 2011-07-05 DIAGNOSIS — E119 Type 2 diabetes mellitus without complications: Secondary | ICD-10-CM | POA: Diagnosis present

## 2011-07-05 LAB — COMPREHENSIVE METABOLIC PANEL
Albumin: 3.1 g/dL — ABNORMAL LOW (ref 3.5–5.2)
Alkaline Phosphatase: 58 U/L (ref 39–117)
BUN: 22 mg/dL (ref 6–23)
CO2: 25 mEq/L (ref 19–32)
Chloride: 99 mEq/L (ref 96–112)
Creatinine, Ser: 1.12 mg/dL (ref 0.50–1.35)
GFR calc Af Amer: 73 mL/min — ABNORMAL LOW (ref >90)
GFR calc non Af Amer: 63 mL/min — ABNORMAL LOW (ref >90)
Glucose, Bld: 192 mg/dL — ABNORMAL HIGH (ref 70–99)
Potassium: 3.6 mEq/L (ref 3.5–5.1)
Total Bilirubin: 1.6 mg/dL — ABNORMAL HIGH (ref 0.3–1.2)

## 2011-07-05 LAB — GLUCOSE, CAPILLARY
Glucose-Capillary: 187 mg/dL — ABNORMAL HIGH (ref 70–99)
Glucose-Capillary: 105 mg/dL — ABNORMAL HIGH (ref 70–99)

## 2011-07-05 LAB — INFLUENZA PANEL BY PCR (TYPE A & B)
Influenza A By PCR: NEGATIVE
Influenza B By PCR: NEGATIVE

## 2011-07-05 LAB — CBC
HCT: 41.1 % (ref 39.0–52.0)
Hemoglobin: 13.8 g/dL (ref 13.0–17.0)
MCV: 76.1 fL — ABNORMAL LOW (ref 78.0–100.0)
WBC: 10.1 10*3/uL (ref 4.0–10.5)

## 2011-07-05 LAB — LIPASE, BLOOD: Lipase: 11 U/L (ref 11–59)

## 2011-07-05 MED ORDER — OSELTAMIVIR PHOSPHATE 75 MG PO CAPS
75.0000 mg | ORAL_CAPSULE | Freq: Two times a day (BID) | ORAL | Status: DC
  Administered 2011-07-05: 75 mg via ORAL
  Filled 2011-07-05 (×2): qty 1

## 2011-07-05 MED ORDER — GLIMEPIRIDE 2 MG PO TABS
2.0000 mg | ORAL_TABLET | Freq: Every day | ORAL | Status: DC
  Administered 2011-07-05 – 2011-07-09 (×5): 2 mg via ORAL
  Filled 2011-07-05 (×7): qty 1

## 2011-07-05 MED ORDER — IOHEXOL 300 MG/ML  SOLN: 75.0000 mL | Freq: Once | INTRAMUSCULAR | Status: AC | PRN

## 2011-07-05 MED ORDER — CIPROFLOXACIN IN D5W 400 MG/200ML IV SOLN
400.0000 mg | Freq: Two times a day (BID) | INTRAVENOUS | Status: DC
  Administered 2011-07-05 – 2011-07-08 (×6): 400 mg via INTRAVENOUS
  Filled 2011-07-05 (×7): qty 200

## 2011-07-05 MED ORDER — ALBUTEROL SULFATE (5 MG/ML) 0.5% IN NEBU
2.5000 mg | INHALATION_SOLUTION | Freq: Four times a day (QID) | RESPIRATORY_TRACT | Status: DC
  Administered 2011-07-05 – 2011-07-07 (×7): 2.5 mg via RESPIRATORY_TRACT
  Filled 2011-07-05 (×8): qty 0.5

## 2011-07-05 MED ORDER — ROSUVASTATIN CALCIUM 20 MG PO TABS
20.0000 mg | ORAL_TABLET | Freq: Every day | ORAL | Status: DC
  Administered 2011-07-05 – 2011-07-08 (×4): 20 mg via ORAL
  Filled 2011-07-05 (×5): qty 1

## 2011-07-05 MED ORDER — ALBUTEROL SULFATE (5 MG/ML) 0.5% IN NEBU: 2.5000 mg | INHALATION_SOLUTION | RESPIRATORY_TRACT | Status: DC | PRN

## 2011-07-05 MED ORDER — NITROGLYCERIN 0.4 MG SL SUBL: 0.4000 mg | SUBLINGUAL_TABLET | SUBLINGUAL | Status: DC | PRN

## 2011-07-05 MED ORDER — HYDROCHLOROTHIAZIDE 25 MG PO TABS
25.0000 mg | ORAL_TABLET | Freq: Every day | ORAL | Status: DC
  Administered 2011-07-05 – 2011-07-07 (×3): 25 mg via ORAL
  Filled 2011-07-05 (×4): qty 1

## 2011-07-05 MED ORDER — CLOPIDOGREL BISULFATE 75 MG PO TABS
75.0000 mg | ORAL_TABLET | Freq: Every day | ORAL | Status: DC
  Administered 2011-07-05 – 2011-07-09 (×5): 75 mg via ORAL
  Filled 2011-07-05 (×6): qty 1

## 2011-07-05 MED ORDER — HYDROMORPHONE HCL PF 1 MG/ML IJ SOLN: 1.0000 mg | INTRAMUSCULAR | Status: DC | PRN

## 2011-07-05 MED ORDER — ACETAMINOPHEN 650 MG RE SUPP: 650.0000 mg | Freq: Four times a day (QID) | RECTAL | Status: DC | PRN

## 2011-07-05 MED ORDER — LEVOTHYROXINE SODIUM 88 MCG PO TABS
88.0000 ug | ORAL_TABLET | Freq: Every day | ORAL | Status: DC
  Administered 2011-07-05 – 2011-07-09 (×5): 88 ug via ORAL
  Filled 2011-07-05 (×5): qty 1

## 2011-07-05 MED ORDER — ASPIRIN 325 MG PO TABS
325.0000 mg | ORAL_TABLET | Freq: Every day | ORAL | Status: DC
  Administered 2011-07-05 – 2011-07-09 (×5): 325 mg via ORAL
  Filled 2011-07-05 (×5): qty 1

## 2011-07-05 MED ORDER — ACETAMINOPHEN 325 MG PO TABS
650.0000 mg | ORAL_TABLET | Freq: Four times a day (QID) | ORAL | Status: DC | PRN
  Administered 2011-07-05 – 2011-07-08 (×4): 650 mg via ORAL
  Filled 2011-07-05 (×4): qty 2

## 2011-07-05 MED ORDER — METRONIDAZOLE IN NACL 5-0.79 MG/ML-% IV SOLN
500.0000 mg | Freq: Three times a day (TID) | INTRAVENOUS | Status: DC
  Administered 2011-07-05 – 2011-07-08 (×8): 500 mg via INTRAVENOUS
  Filled 2011-07-05 (×10): qty 100

## 2011-07-05 MED ORDER — SODIUM CHLORIDE 0.9 % IV SOLN
INTRAVENOUS | Status: DC
  Administered 2011-07-05 – 2011-07-07 (×4): via INTRAVENOUS

## 2011-07-05 MED ORDER — ENOXAPARIN SODIUM 40 MG/0.4ML ~~LOC~~ SOLN
40.0000 mg | Freq: Every day | SUBCUTANEOUS | Status: DC
  Administered 2011-07-05 – 2011-07-08 (×4): 40 mg via SUBCUTANEOUS
  Filled 2011-07-05 (×5): qty 0.4

## 2011-07-05 MED ORDER — OLMESARTAN MEDOXOMIL 20 MG PO TABS
20.0000 mg | ORAL_TABLET | Freq: Every day | ORAL | Status: DC
  Administered 2011-07-05 – 2011-07-07 (×3): 20 mg via ORAL
  Filled 2011-07-05 (×4): qty 1

## 2011-07-05 MED ORDER — FAMOTIDINE 20 MG PO TABS
20.0000 mg | ORAL_TABLET | Freq: Every day | ORAL | Status: DC
  Administered 2011-07-05 – 2011-07-09 (×5): 20 mg via ORAL
  Filled 2011-07-05 (×5): qty 1

## 2011-07-05 MED ORDER — ONDANSETRON HCL 4 MG PO TABS: 4.0000 mg | ORAL_TABLET | Freq: Four times a day (QID) | ORAL | Status: DC | PRN

## 2011-07-05 MED ORDER — HYDROCODONE-ACETAMINOPHEN 5-325 MG PO TABS: 1.0000 | ORAL_TABLET | ORAL | Status: DC | PRN

## 2011-07-05 MED ORDER — PROMETHAZINE HCL 25 MG/ML IJ SOLN
12.5000 mg | INTRAMUSCULAR | Status: AC
  Administered 2011-07-05: 12.5 mg via INTRAVENOUS
  Filled 2011-07-05: qty 1

## 2011-07-05 MED ORDER — METOPROLOL TARTRATE 25 MG PO TABS
25.0000 mg | ORAL_TABLET | Freq: Two times a day (BID) | ORAL | Status: DC
  Administered 2011-07-05 – 2011-07-07 (×6): 25 mg via ORAL
  Filled 2011-07-05 (×8): qty 1

## 2011-07-05 MED ORDER — ONDANSETRON HCL 4 MG/2ML IJ SOLN
4.0000 mg | Freq: Four times a day (QID) | INTRAMUSCULAR | Status: DC | PRN
  Administered 2011-07-05 – 2011-07-08 (×3): 4 mg via INTRAVENOUS
  Filled 2011-07-05 (×3): qty 2

## 2011-07-05 NOTE — H&P (Signed)
Benjamin Harrison is an 76 y.o. male.   Chief Complaint: Fever, aches, SOB HPI: 76 Yo with multiple medical problems here with 2 days of Fever, Shortness of Breath, cough, myalgias and generalized body aches. He has had Rhinorrhea, some sore throat. Denies Sick contact. Patient also noted diarrhea  Since yesterday. It was copious, foul smelling and multiple episodes.   Past Medical History  Diagnosis Date  . Coronary artery disease     STATUS POST INFERIOR WALL M.I.  . MI (myocardial infarction)     STATUS POST PTC AND STENTING OF HIS MID RCA  . Hypertension   . Hypothyroidism   . Hyperlipidemia   . Chest pain     HEAVINESS  . SOB (shortness of breath) on exertion   . Personal history of tobacco use     REMOTE IN THE PAST  . Diabetes mellitus     Past Surgical History  Procedure Date  . Coronary artery bypass graft   . Cardiac catheterization     History reviewed. No pertinent family history. Social History:  reports that he quit smoking about 35 years ago. He does not have any smokeless tobacco history on file. He reports that he does not drink alcohol or use illicit drugs.  Allergies:  Allergies  Allergen Reactions  . Ace Inhibitors     Medications Prior to Admission  Medication Dose Route Frequency Provider Last Rate Last Dose  . 0.9 %  sodium chloride infusion   Intravenous Once Dione Booze, MD 125 mL/hr at 07/05/11 0121    . 0.9 %  sodium chloride infusion   Intravenous Continuous Lawal Angelisse Riso      . acetaminophen (TYLENOL) tablet 650 mg  650 mg Oral Q6H PRN Lawal Lenola Lockner       Or  . acetaminophen (TYLENOL) suppository 650 mg  650 mg Rectal Q6H PRN Lawal Katha Kuehne      . acetaminophen (TYLENOL) tablet 650 mg  650 mg Oral Once Dione Booze, MD   650 mg at 07/04/11 1608  . albuterol (PROVENTIL) (5 MG/ML) 0.5% nebulizer solution 2.5 mg  2.5 mg Nebulization Q6H Lawal Mackenzy Grumbine      . albuterol (PROVENTIL) (5 MG/ML) 0.5% nebulizer solution 2.5 mg  2.5 mg Nebulization Q2H PRN Lawal  Tymon Nemetz      . aspirin chewable tablet 324 mg  324 mg Oral Once Dione Booze, MD   324 mg at 07/04/11 1608  . aspirin tablet 325 mg  325 mg Oral Daily Lawal Hattye Siegfried      . clopidogrel (PLAVIX) tablet 75 mg  75 mg Oral Q breakfast Lawal Renee Beale      . enoxaparin (LOVENOX) injection 40 mg  40 mg Subcutaneous Daily Lawal Torra Pala      . famotidine (PEPCID) tablet 20 mg  20 mg Oral Daily Lawal Tilford Deaton      . glimepiride (AMARYL) tablet 2 mg  2 mg Oral QAC breakfast Lawal Pinki Rottman      . hydrochlorothiazide (HYDRODIURIL) tablet 25 mg  25 mg Oral Daily Lawal Darek Eifler      . HYDROcodone-acetaminophen (NORCO) 5-325 MG per tablet 1-2 tablet  1-2 tablet Oral Q4H PRN Lawal Reshard Guillet      . HYDROmorphone (DILAUDID) injection 1 mg  1 mg Intravenous Q4H PRN Lawal Clennon Nasca      . iohexol (OMNIPAQUE) 350 MG/ML injection 100 mL  100 mL Intravenous Once PRN Medication Radiologist   100 mL at 07/04/11 2110  . levothyroxine (SYNTHROID, LEVOTHROID) tablet 88 mcg  88  mcg Oral Daily Lawal Ranita Stjulien      . metoprolol tartrate (LOPRESSOR) tablet 25 mg  25 mg Oral BID Lawal Raelie Lohr      . morphine 4 MG/ML injection 4 mg  4 mg Intravenous Once Dione Booze, MD   4 mg at 07/04/11 1724  . morphine 4 MG/ML injection 4 mg  4 mg Intravenous Once Dione Booze, MD   4 mg at 07/04/11 2001  . nitroGLYCERIN (NITROSTAT) SL tablet 0.4 mg  0.4 mg Sublingual Q5 min PRN Lawal Estil Vallee      . olmesartan (BENICAR) tablet 20 mg  20 mg Oral Daily Lawal Dniyah Grant      . ondansetron (ZOFRAN) 4 MG/2ML injection        4 mg at 07/04/11 1809  . ondansetron (ZOFRAN) injection 4 mg  4 mg Intramuscular Once Dione Booze, MD   4 mg at 07/04/11 1959  . ondansetron (ZOFRAN) tablet 4 mg  4 mg Oral Q6H PRN Lawal Leeroy Lovings       Or  . ondansetron (ZOFRAN) injection 4 mg  4 mg Intravenous Q6H PRN Lawal Cambry Spampinato      . oseltamivir (TAMIFLU) capsule 75 mg  75 mg Oral BID Lawal Damico Partin      . promethazine (PHENERGAN) injection 12.5 mg  12.5 mg Intravenous To Major John L Molpus, MD   12.5 mg at 07/05/11  0118  . rosuvastatin (CRESTOR) tablet 20 mg  20 mg Oral QHS Lawal Geeta Dworkin      . sodium chloride 0.9 % bolus 500 mL  500 mL Intravenous Once Dione Booze, MD   1,000 mL at 07/04/11 2338  . DISCONTD: 0.9 %  sodium chloride infusion   Intravenous Continuous Richardo Priest, MD 20 mL/hr at 07/04/11 1440     Medications Prior to Admission  Medication Sig Dispense Refill  . aspirin 325 MG tablet Take 325 mg by mouth daily.        . clopidogrel (PLAVIX) 75 MG tablet TAKE 1 TABLET BY MOUTH EVERY DAY  30 tablet  5  . famotidine (PEPCID) 20 MG tablet Take 1 tablet (20 mg total) by mouth daily.  30 tablet    . glimepiride (AMARYL) 2 MG tablet Take 2 mg by mouth daily before breakfast.        . hydrochlorothiazide 25 MG tablet Take 1 tablet (25 mg total) by mouth daily.  30 tablet  5  . levothyroxine (SYNTHROID, LEVOTHROID) 88 MCG tablet Take 88 mcg by mouth daily.        . metoprolol (LOPRESSOR) 50 MG tablet Take 25 mg by mouth 2 (two) times daily.        . nitroGLYCERIN (NITROSTAT) 0.4 MG SL tablet Place 0.4 mg under the tongue every 5 (five) minutes as needed. For chest pain.      . rosuvastatin (CRESTOR) 20 MG tablet Take 1 tablet (20 mg total) by mouth at bedtime.  30 tablet    . valsartan (DIOVAN) 160 MG tablet Take 1 tablet (160 mg total) by mouth daily.  30 tablet  11    Results for orders placed during the hospital encounter of 07/04/11 (from the past 48 hour(s))  CBC     Status: Abnormal   Collection Time   07/04/11  3:53 PM      Component Value Range Comment   WBC 14.1 (*) 4.0 - 10.5 (K/uL)    RBC 5.75  4.22 - 5.81 (MIL/uL)    Hemoglobin 15.4  13.0 - 17.0 (g/dL)  HCT 43.0  39.0 - 52.0 (%)    MCV 74.8 (*) 78.0 - 100.0 (fL)    MCH 26.8  26.0 - 34.0 (pg)    MCHC 35.8  30.0 - 36.0 (g/dL)    RDW 16.1  09.6 - 04.5 (%)    Platelets 154  150 - 400 (K/uL)   DIFFERENTIAL     Status: Abnormal   Collection Time   07/04/11  3:53 PM      Component Value Range Comment   Neutrophils Relative 86 (*) 43  - 77 (%)    Neutro Abs 12.2 (*) 1.7 - 7.7 (K/uL)    Lymphocytes Relative 3 (*) 12 - 46 (%)    Lymphs Abs 0.4 (*) 0.7 - 4.0 (K/uL)    Monocytes Relative 11  3 - 12 (%)    Monocytes Absolute 1.5 (*) 0.1 - 1.0 (K/uL)    Eosinophils Relative 0  0 - 5 (%)    Eosinophils Absolute 0.0  0.0 - 0.7 (K/uL)    Basophils Relative 0  0 - 1 (%)    Basophils Absolute 0.0  0.0 - 0.1 (K/uL)   BASIC METABOLIC PANEL     Status: Abnormal   Collection Time   07/04/11  3:53 PM      Component Value Range Comment   Sodium 136  135 - 145 (mEq/L)    Potassium 4.2  3.5 - 5.1 (mEq/L)    Chloride 99  96 - 112 (mEq/L)    CO2 26  19 - 32 (mEq/L)    Glucose, Bld 217 (*) 70 - 99 (mg/dL)    BUN 27 (*) 6 - 23 (mg/dL)    Creatinine, Ser 4.09  0.50 - 1.35 (mg/dL)    Calcium 9.7  8.4 - 10.5 (mg/dL)    GFR calc non Af Amer 63 (*) >90 (mL/min)    GFR calc Af Amer 74 (*) >90 (mL/min)   D-DIMER, QUANTITATIVE     Status: Abnormal   Collection Time   07/04/11  3:54 PM      Component Value Range Comment   D-Dimer, Quant 0.83 (*) 0.00 - 0.48 (ug/mL-FEU)   PRO B NATRIURETIC PEPTIDE     Status: Normal   Collection Time   07/04/11  4:01 PM      Component Value Range Comment   BNP, POC 107.4  0 - 125 (pg/mL)   TROPONIN I     Status: Normal   Collection Time   07/04/11  4:01 PM      Component Value Range Comment   Troponin I <0.30  <0.30 (ng/mL)   CBC     Status: Abnormal   Collection Time   07/05/11  3:49 AM      Component Value Range Comment   WBC 10.1  4.0 - 10.5 (K/uL)    RBC 5.40  4.22 - 5.81 (MIL/uL)    Hemoglobin 13.8  13.0 - 17.0 (g/dL)    HCT 81.1  91.4 - 78.2 (%)    MCV 76.1 (*) 78.0 - 100.0 (fL)    MCH 25.6 (*) 26.0 - 34.0 (pg)    MCHC 33.6  30.0 - 36.0 (g/dL)    RDW 95.6 (*) 21.3 - 15.5 (%)    Platelets 145 (*) 150 - 400 (K/uL)   COMPREHENSIVE METABOLIC PANEL     Status: Abnormal   Collection Time   07/05/11  3:49 AM      Component Value Range Comment   Sodium 133 (*) 135 -  145 (mEq/L)    Potassium 3.6   3.5 - 5.1 (mEq/L)    Chloride 99  96 - 112 (mEq/L)    CO2 25  19 - 32 (mEq/L)    Glucose, Bld 192 (*) 70 - 99 (mg/dL)    BUN 22  6 - 23 (mg/dL)    Creatinine, Ser 1.61  0.50 - 1.35 (mg/dL)    Calcium 8.2 (*) 8.4 - 10.5 (mg/dL)    Total Protein 6.3  6.0 - 8.3 (g/dL)    Albumin 3.1 (*) 3.5 - 5.2 (g/dL)    AST 17  0 - 37 (U/L)    ALT 18  0 - 53 (U/L)    Alkaline Phosphatase 58  39 - 117 (U/L)    Total Bilirubin 1.6 (*) 0.3 - 1.2 (mg/dL)    GFR calc non Af Amer 63 (*) >90 (mL/min)    GFR calc Af Amer 73 (*) >90 (mL/min)    Ct Angio Chest W/cm &/or Wo Cm  07/04/2011  *RADIOLOGY REPORT*  Clinical Data:  Chest pain with shortness of breath and fever. Question pulmonary embolism.  CT ANGIOGRAPHY CHEST WITH CONTRAST  Technique:  Multidetector CT imaging of the chest was performed using the standard protocol during bolus administration of intravenous contrast.  Multiplanar CT image reconstructions including MIPs were obtained to evaluate the vascular anatomy.  Contrast: OMNIPAQUE IOHEXOL 350 MG/ML IV SOLN  Comparison:  Chest radiographs today and 02/23/2009.  Abdominal CT 02/26/2005.  Findings:  The pulmonary arteries are well opacified with contrast. There is no evidence of acute pulmonary embolism.  Patient is status post CABG.  There is scattered atherosclerosis.  No enlarged mediastinal or hilar lymph nodes are present.  There is no significant pleural or pericardial effusion.  A small hiatal hernia is noted.  There is increased linear atelectasis in the left lower lobe.  The lungs are otherwise clear.  There is no confluent airspace opacity.  No acute findings are demonstrated within the upper abdomen. The liver demonstrates diffusely decreased density consistent with steatosis.  Review of the MIP images confirms the above findings.  IMPRESSION:  1.  No evidence of acute pulmonary embolism or other acute chest process. 2.  Mildly increased left lower lobe atelectasis. 3.  Hepatic steatosis.   Original Report Authenticated By: Gerrianne Scale, M.D.   Dg Chest Port 1 View  07/04/2011  *RADIOLOGY REPORT*  Clinical Data: Chest pain, weakness, nausea  PORTABLE CHEST - 1 VIEW  Comparison: 02/23/2009  Findings: Cardiomediastinal silhouette is stable.  Status post CABG again noted.  No acute infiltrate or pleural effusion.  No pulmonary edema.  Bony thorax is stable.  IMPRESSION: Status post CABG.  No active disease.  No significant change.  Original Report Authenticated By: Natasha Mead, M.D.    Review of Systems  Constitutional: Positive for fever, chills, malaise/fatigue and diaphoresis.  HENT: Positive for congestion, sore throat and neck pain. Negative for hearing loss, ear pain, nosebleeds, tinnitus and ear discharge.   Eyes: Negative.   Respiratory: Positive for cough, shortness of breath and wheezing. Negative for hemoptysis and sputum production.   Cardiovascular: Positive for chest pain and palpitations. Negative for orthopnea, claudication, leg swelling and PND.  Gastrointestinal: Positive for diarrhea. Negative for nausea, vomiting, abdominal pain, constipation, blood in stool and melena.  Genitourinary: Negative.   Musculoskeletal: Positive for myalgias, back pain and joint pain. Negative for falls.  Skin: Negative.   Neurological: Positive for weakness and headaches.  Endo/Heme/Allergies: Negative.  Psychiatric/Behavioral: Negative.     Blood pressure 132/90, pulse 104, temperature 98.5 F (36.9 C), temperature source Oral, resp. rate 20, height 5\' 6"  (1.676 m), weight 83.235 kg (183 lb 8 oz), SpO2 97.00%. Physical Exam  Constitutional: He is oriented to person, place, and time. He appears well-developed and well-nourished.  HENT:  Head: Normocephalic and atraumatic.  Right Ear: External ear normal.  Left Ear: External ear normal.  Eyes: Conjunctivae and EOM are normal. Pupils are equal, round, and reactive to light.  Neck: Normal range of motion. Neck supple.    Cardiovascular: Normal rate, regular rhythm, normal heart sounds and intact distal pulses.   Respiratory: Effort normal and breath sounds normal.  GI: Soft. Bowel sounds are normal.  Musculoskeletal: Normal range of motion.  Neurological: He is alert and oriented to person, place, and time. He has normal reflexes.  Skin: Skin is warm and dry.  Psychiatric: He has a normal mood and affect.     Assessment/Plan 1. Possible Influenza A: Based on patient's presentation and the season, I suspect Influenza or Influenza-like illness. No evidence of pneumonia on CXR. Will admit, get nasal swabs, empiric Tamiflu, supportive measures 2. Diarrhea: cause unclear but could be the Flu or other infectious causes including Cdiff Colitis. Will send stool for Cdiff assay. 3. DM2: Controlled. SSI 4.Hypothyroidsm: Continue Syntheroid CAD: Stable. Keep on Tele.  Jahniya Duzan,LAWAL 07/05/2011, 5:59 AM

## 2011-07-05 NOTE — Progress Notes (Signed)
Inpatient Diabetes Program Recommendations  AACE/ADA: New Consensus Statement on Inpatient Glycemic Control (2009)  Target Ranges:  Prepandial:   less than 140 mg/dL      Peak postprandial:   less than 180 mg/dL (1-2 hours)      Critically ill patients:  140 - 180 mg/dL   Reason for Visit: Glucose on admission greater than 200 mg/dL  Inpatient Diabetes Program Recommendations Correction (SSI): Please check cbg's tidwc and order sensitive correction to maintain glucose less than 180 mg/dL. HgbA1C: Please order or document a current A1C or within the past 3 months as required per [employer].  Note: Thank you, Lenor Coffin, RN, CNS, Diabetes Coordinator [phone])

## 2011-07-05 NOTE — Progress Notes (Signed)
Utilization Review Completed.Mehgan Santmyer T12/11/2010   

## 2011-07-05 NOTE — Progress Notes (Signed)
Patient seen and examined, admitted by Dr. Mikeal Hawthorne this a.m. Briefly 76 year old male with history of coronary disease hypertension, hypothyroidism, hyperlipidemia admitted with fevers, shortness of breath myalgias, nausea, abdominal pain and diarrhea - Data reviewed, agree with assessment and plan per Dr. Mikeal Hawthorne, likely viral illness/ gastroenteritis. -  flu negative, CT angiogram of the chest negative for any pulmonary embolism, onset of cardiac enzymes negative - Obtain lipase, stool studies, C. difficile via PCR, CT abdomen and pelvis to rule out any colitis. - Clear liquid diet, continue IV fluids, PT OT evaluation - Will continue to follow

## 2011-07-06 DIAGNOSIS — K529 Noninfective gastroenteritis and colitis, unspecified: Secondary | ICD-10-CM | POA: Diagnosis present

## 2011-07-06 LAB — FECAL LACTOFERRIN, QUANT

## 2011-07-06 LAB — BASIC METABOLIC PANEL
BUN: 15 mg/dL (ref 6–23)
CO2: 26 mEq/L (ref 19–32)
Chloride: 101 mEq/L (ref 96–112)
GFR calc non Af Amer: 66 mL/min — ABNORMAL LOW (ref >90)
Glucose, Bld: 123 mg/dL — ABNORMAL HIGH (ref 70–99)
Potassium: 3.1 mEq/L — ABNORMAL LOW (ref 3.5–5.1)
Sodium: 136 mEq/L (ref 135–145)

## 2011-07-06 LAB — GLUCOSE, CAPILLARY: Glucose-Capillary: 126 mg/dL — ABNORMAL HIGH (ref 70–99)

## 2011-07-06 LAB — CBC
HCT: 39.1 % (ref 39.0–52.0)
Hemoglobin: 13 g/dL (ref 13.0–17.0)
MCH: 25 pg — ABNORMAL LOW (ref 26.0–34.0)
MCHC: 33.2 g/dL (ref 30.0–36.0)
MCV: 75.2 fL — ABNORMAL LOW (ref 78.0–100.0)
RBC: 5.2 MIL/uL (ref 4.22–5.81)

## 2011-07-06 LAB — OVA AND PARASITE EXAMINATION

## 2011-07-06 MED ORDER — LOPERAMIDE HCL 2 MG PO CAPS: 2.0000 mg | ORAL_CAPSULE | ORAL | Status: DC | PRN

## 2011-07-06 MED ORDER — LOPERAMIDE HCL 2 MG PO CAPS
2.0000 mg | ORAL_CAPSULE | Freq: Once | ORAL | Status: AC
  Administered 2011-07-06: 2 mg via ORAL
  Filled 2011-07-06: qty 1

## 2011-07-06 MED ORDER — SACCHAROMYCES BOULARDII 250 MG PO CAPS
250.0000 mg | ORAL_CAPSULE | Freq: Two times a day (BID) | ORAL | Status: DC
  Administered 2011-07-06 – 2011-07-09 (×7): 250 mg via ORAL
  Filled 2011-07-06 (×9): qty 1

## 2011-07-06 NOTE — Progress Notes (Signed)
Benjamin Harrison  ZOX:[id number]  DOB: August 09, 1934  DOA: 07/04/2011  Subjective: S: Still complaining of watery diarrhea with nausea multiple times, no significant abdominal pain or vomiting, afebrile.  Objective: Weight change:   Intake/Output Summary (Last 24 hours) at 07/06/11 1118 Last data filed at 07/05/11 1700  Gross per 24 hour  Intake    775 ml  Output      0 ml  Net    775 ml   Blood pressure 111/72, pulse 96, temperature 97.8 F (36.6 C), temperature source Oral, resp. rate 20, height 5\' 6"  (1.676 m), weight 83.235 kg (183 lb 8 oz), SpO2 98.00%.  Physical Exam: General: Alert and awake, oriented x3, not in any acute distress. HEENT: anicteric sclera, pupils reactive to light and accommodation, EOMI CVS: S1-S2 clear, no murmur rubs or gallops Chest: clear to auscultation bilaterally, no wheezing, rales or rhonchi Abdomen: soft nontender, nondistended, normal bowel sounds, no organomegaly Extremities: no cyanosis, clubbing or edema noted bilaterally Neuro: Cranial nerves II-XII intact, no focal neurological deficits  Lab Results: Basic Metabolic Panel:  Lab 07/06/11 8119 07/05/11 0349  NA 136 133*  K 3.1* 3.6  CL 101 99  CO2 26 25  GLUCOSE 123* 192*  BUN 15 22  CREATININE 1.07 1.12  CALCIUM 8.4 8.2*  MG -- --  PHOS -- --   Liver Function Tests:  Lab 07/05/11 0349  AST 17  ALT 18  ALKPHOS 58  BILITOT 1.6*  PROT 6.3  ALBUMIN 3.1*    Lab 07/05/11 0924  LIPASE 11  AMYLASE --   CBC:  Lab 07/06/11 0520 07/05/11 0349 07/04/11 1553  WBC 5.9 10.1 --  NEUTROABS -- -- 12.2*  HGB 13.0 13.8 --  HCT 39.1 41.1 --  MCV 75.2* 76.1* --  PLT 136* 145* --   Cardiac Enzymes:  Lab 07/04/11 1601  CKTOTAL --  CKMB --  CKMBINDEX --  TROPONINI <0.30   BNP:  Lab 07/04/11 1601  POCBNP 107.4   CBG:  Lab 07/06/11 0628 07/05/11 1656 07/05/11 1138 07/05/11 0603  GLUCAP 126* 105* 145* 187*     Micro Results: Recent Results (from the past 240 hour(s))    CLOSTRIDIUM DIFFICILE BY PCR     Status: Normal   Collection Time   07/05/11  5:16 AM      Component Value Range Status Comment   C difficile by pcr NEGATIVE  NEGATIVE  Final   CLOSTRIDIUM DIFFICILE BY PCR     Status: Normal   Collection Time   07/05/11 10:03 AM      Component Value Range Status Comment   C difficile by pcr NEGATIVE  NEGATIVE  Final     Studies/Results: Ct Angio Chest W/cm &/or Wo Cm  07/04/2011  *RADIOLOGY REPORT*  Clinical Data:  Chest pain with shortness of breath and fever. Question pulmonary embolism.  CT ANGIOGRAPHY CHEST WITH CONTRAST  Technique:  Multidetector CT imaging of the chest was performed using the standard protocol during bolus administration of intravenous contrast.  Multiplanar CT image reconstructions including MIPs were obtained to evaluate the vascular anatomy.  Contrast: OMNIPAQUE IOHEXOL 350 MG/ML IV SOLN  Comparison:  Chest radiographs today and 02/23/2009.  Abdominal CT 02/26/2005.  Findings:  The pulmonary arteries are well opacified with contrast. There is no evidence of acute pulmonary embolism.  Patient is status post CABG.  There is scattered atherosclerosis.  No enlarged mediastinal or hilar lymph nodes are present.  There is no significant pleural or pericardial  effusion.  A small hiatal hernia is noted.  There is increased linear atelectasis in the left lower lobe.  The lungs are otherwise clear.  There is no confluent airspace opacity.  No acute findings are demonstrated within the upper abdomen. The liver demonstrates diffusely decreased density consistent with steatosis.  Review of the MIP images confirms the above findings.  IMPRESSION:  1.  No evidence of acute pulmonary embolism or other acute chest process. 2.  Mildly increased left lower lobe atelectasis. 3.  Hepatic steatosis.  Original Report Authenticated By: Gerrianne Scale, M.D.   Ct Abdomen Pelvis W Contrast  07/05/2011  *RADIOLOGY REPORT*  Clinical Data: Abdominal pain.   Nausea vomiting.  Diarrhea.  CT ABDOMEN AND PELVIS WITH CONTRAST  Technique:  Multidetector CT imaging of the abdomen and pelvis was performed following the standard protocol during bolus administration of intravenous contrast.  Contrast:  75 ml Omnipaque-300 and oral contrast  Comparison: 02/26/2005  Findings: Mild to moderate colonic wall thickening is seen involving the ascending colon and hepatic flexure, consistent with right sided colitis.  There is no evidence of involvement of the terminal ileum.  Diverticulosis is seen involving the descending sigmoid colon, however there is no evidence of diverticulitis in these regions.  No evidence of abscess or free fluid.  No evidence of bowel obstruction.  Tiny hiatal hernia is seen.  Hepatic steatosis is demonstrated, however no liver masses are identified.  The gallbladder, spleen, pancreas, and adrenal glands are normal in appearance.  Probable tiny less than 1 cm right renal cyst noted, however there is no evidence of renal mass or hydronephrosis.  Increased size of mild abdominal aortic aneurysm is seen measuring 3.2 cm in greatest diameter.  No evidence of aneurysm leak or rupture.  No soft tissue masses or lymphadenopathy identified. Mildly enlarged prostate gland is stable.  IMPRESSION:  1.  Mild to moderate right-sided colitis.  No evidence of abscess or other complication. 2.  Increased size of 3.2 cm infrarenal abdominal aortic aneurysm. 3.  Stable hepatic steatosis, tiny hiatal hernia, left-sided colonic diverticulosis, and mildly enlarged prostate.  Original Report Authenticated By: Danae Orleans, M.D.   Dg Chest Port 1 View  07/04/2011  *RADIOLOGY REPORT*  Clinical Data: Chest pain, weakness, nausea  PORTABLE CHEST - 1 VIEW  Comparison: 02/23/2009  Findings: Cardiomediastinal silhouette is stable.  Status post CABG again noted.  No acute infiltrate or pleural effusion.  No pulmonary edema.  Bony thorax is stable.  IMPRESSION: Status post CABG.  No  active disease.  No significant change.  Original Report Authenticated By: Natasha Mead, M.D.    Medications: Scheduled Meds:   . albuterol  2.5 mg Nebulization Q6H  . aspirin  325 mg Oral Daily  . ciprofloxacin  400 mg Intravenous Q12H  . clopidogrel  75 mg Oral Q breakfast  . enoxaparin  40 mg Subcutaneous Daily  . famotidine  20 mg Oral Daily  . glimepiride  2 mg Oral QAC breakfast  . hydrochlorothiazide  25 mg Oral Daily  . levothyroxine  88 mcg Oral Daily  . metoprolol  25 mg Oral BID  . metronidazole  500 mg Intravenous Q8H  . olmesartan  20 mg Oral Daily  . rosuvastatin  20 mg Oral QHS   Continuous Infusions:   . sodium chloride 100 mL/hr at 07/05/11 0400     Assessment/Plan: Principal Problem:  *Right-sided colitis with intractable diarrhea: - C. difficile negative, stool studies pending, continue IV fluids, increase diet  to full liquids - Add Imodium and Crestor, continue ciprofloxacin and Flagyl - GI consultation called,   Active Problems:  Hypertension: Stable  Hypothyroidism: Continue Synthroid  Hyperlipidemia: Continue Crestor  Diabetes mellitus: Blood sugars controlled  history of CAD: stable, cont ASA, plavix, BB, benicar  Weakness generalized: Obtain PT/OT consultation when diarrhea is somewhat improved.    LOS: 2 days   RAI,RIPUDEEP 07/06/2011, 11:18 AM

## 2011-07-06 NOTE — Consult Note (Signed)
[facility] Consult Note  Referring Provider: No ref. provider found Primary Care Physician:  Kaleen Mask, MD Primary Gastroenterologist:  Dr.  Antony Contras Complaint: Abdominal pain and diarrhea HPI: Benjamin Harrison is an 76 y.o. white male.  Who presents with a 1-2 day history of his use of abdominal pain nausea and dry heaving and copious diarrhea. He denies any recent antibiotics, recent exposure or travel. He does is drinking water from a well. He was admitted thought to have the flu when his GI symptoms became more prominent. Abdominal CT was obtained which showed right-sided colitis  Terminal ileum. The patient has no history of previous inflammatory bowel disease. He thinks he's had a colonoscopy done about 10 years ago. He has not seen any overt blood in his stool. He was started on Cipro and Flagyl. A C. difficile toxin was obtained and was negative. Past Medical History  Diagnosis Date  . Coronary artery disease     STATUS POST INFERIOR WALL M.I.  . MI (myocardial infarction)     STATUS POST PTC AND STENTING OF HIS MID RCA  . Hypertension   . Hypothyroidism   . Hyperlipidemia   . Chest pain     HEAVINESS  . SOB (shortness of breath) on exertion   . Personal history of tobacco use     REMOTE IN THE PAST  . Diabetes mellitus     Past Surgical History  Procedure Date  . Coronary artery bypass graft   . Cardiac catheterization     Medications Prior to Admission  Medication Dose Route Frequency Provider Last Rate Last Dose  . 0.9 %  sodium chloride infusion   Intravenous Once Dione Booze, MD 125 mL/hr at 07/05/11 0121    . 0.9 %  sodium chloride infusion   Intravenous Continuous Lawal Garba 100 mL/hr at 07/05/11 0400    . acetaminophen (TYLENOL) tablet 650 mg  650 mg Oral Q6H PRN Lawal Garba   650 mg at 07/06/11 1546   Or  . acetaminophen (TYLENOL) suppository 650 mg  650 mg Rectal Q6H PRN Lawal Garba      . acetaminophen (TYLENOL) tablet 650 mg  650 mg  Oral Once Dione Booze, MD   650 mg at 07/04/11 1608  . albuterol (PROVENTIL) (5 MG/ML) 0.5% nebulizer solution 2.5 mg  2.5 mg Nebulization Q6H Lawal Garba   2.5 mg at 07/06/11 0843  . albuterol (PROVENTIL) (5 MG/ML) 0.5% nebulizer solution 2.5 mg  2.5 mg Nebulization Q2H PRN Lawal Garba      . aspirin chewable tablet 324 mg  324 mg Oral Once Dione Booze, MD   324 mg at 07/04/11 1608  . aspirin tablet 325 mg  325 mg Oral Daily Lawal Garba   325 mg at 07/06/11 1059  . ciprofloxacin (CIPRO) IVPB 400 mg  400 mg Intravenous Q12H Ripudeep Jenna Luo, MD   400 mg at 07/06/11 0643  . clopidogrel (PLAVIX) tablet 75 mg  75 mg Oral Q breakfast Lawal Garba   75 mg at 07/06/11 0645  . enoxaparin (LOVENOX) injection 40 mg  40 mg Subcutaneous Daily Lawal Garba   40 mg at 07/06/11 1059  . famotidine (PEPCID) tablet 20 mg  20 mg Oral Daily Lawal Garba   20 mg at 07/06/11 1100  . glimepiride (AMARYL) tablet 2 mg  2 mg Oral QAC breakfast Lawal Garba   2 mg at 07/06/11 0645  . hydrochlorothiazide (HYDRODIURIL) tablet 25 mg  25 mg Oral Daily Lawal Mikeal Hawthorne  25 mg at 07/06/11 1100  . HYDROcodone-acetaminophen (NORCO) 5-325 MG per tablet 1-2 tablet  1-2 tablet Oral Q4H PRN Lawal Garba      . HYDROmorphone (DILAUDID) injection 1 mg  1 mg Intravenous Q4H PRN Lawal Garba      . iohexol (OMNIPAQUE) 300 MG/ML injection 75 mL  75 mL Intravenous Once PRN Medication Radiologist      . iohexol (OMNIPAQUE) 350 MG/ML injection 100 mL  100 mL Intravenous Once PRN Medication Radiologist   100 mL at 07/04/11 2110  . levothyroxine (SYNTHROID, LEVOTHROID) tablet 88 mcg  88 mcg Oral Daily Lawal Garba   88 mcg at 07/06/11 1100  . loperamide (IMODIUM) capsule 2 mg  2 mg Oral PRN Ripudeep K Rai, MD      . loperamide (IMODIUM) capsule 2 mg  2 mg Oral Once Ripudeep Jenna Luo, MD   2 mg at 07/06/11 1324  . metoprolol tartrate (LOPRESSOR) tablet 25 mg  25 mg Oral BID Lawal Garba   25 mg at 07/06/11 1100  . metroNIDAZOLE (FLAGYL) IVPB 500 mg  500 mg  Intravenous Q8H Ripudeep K Rai, MD   500 mg at 07/06/11 1101  . morphine 4 MG/ML injection 4 mg  4 mg Intravenous Once Dione Booze, MD   4 mg at 07/04/11 1724  . morphine 4 MG/ML injection 4 mg  4 mg Intravenous Once Dione Booze, MD   4 mg at 07/04/11 2001  . nitroGLYCERIN (NITROSTAT) SL tablet 0.4 mg  0.4 mg Sublingual Q5 min PRN Lawal Garba      . olmesartan (BENICAR) tablet 20 mg  20 mg Oral Daily Lawal Garba   20 mg at 07/06/11 1101  . ondansetron (ZOFRAN) 4 MG/2ML injection        4 mg at 07/04/11 1809  . ondansetron (ZOFRAN) injection 4 mg  4 mg Intramuscular Once Dione Booze, MD   4 mg at 07/04/11 1959  . ondansetron (ZOFRAN) tablet 4 mg  4 mg Oral Q6H PRN Lawal Garba       Or  . ondansetron (ZOFRAN) injection 4 mg  4 mg Intravenous Q6H PRN Lawal Garba   4 mg at 07/05/11 2228  . promethazine (PHENERGAN) injection 12.5 mg  12.5 mg Intravenous To Major Breaunna Gottlieb L Molpus, MD   12.5 mg at 07/05/11 0118  . rosuvastatin (CRESTOR) tablet 20 mg  20 mg Oral QHS Lawal Garba   20 mg at 07/05/11 2216  . saccharomyces boulardii (FLORASTOR) capsule 250 mg  250 mg Oral BID Ripudeep Jenna Luo, MD   250 mg at 07/06/11 1324  . sodium chloride 0.9 % bolus 500 mL  500 mL Intravenous Once Dione Booze, MD   1,000 mL at 07/04/11 2338  . DISCONTD: 0.9 %  sodium chloride infusion   Intravenous Continuous Richardo Priest, MD 20 mL/hr at 07/04/11 1440    . DISCONTD: oseltamivir (TAMIFLU) capsule 75 mg  75 mg Oral BID Lawal Garba   75 mg at 07/05/11 1610   Medications Prior to Admission  Medication Sig Dispense Refill  . aspirin 325 MG tablet Take 325 mg by mouth daily.        . clopidogrel (PLAVIX) 75 MG tablet TAKE 1 TABLET BY MOUTH EVERY DAY  30 tablet  5  . famotidine (PEPCID) 20 MG tablet Take 1 tablet (20 mg total) by mouth daily.  30 tablet    . glimepiride (AMARYL) 2 MG tablet Take 2 mg by mouth daily before breakfast.        .  hydrochlorothiazide 25 MG tablet Take 1 tablet (25 mg total) by mouth daily.  30 tablet  5   . levothyroxine (SYNTHROID, LEVOTHROID) 88 MCG tablet Take 88 mcg by mouth daily.        . metoprolol (LOPRESSOR) 50 MG tablet Take 25 mg by mouth 2 (two) times daily.        . nitroGLYCERIN (NITROSTAT) 0.4 MG SL tablet Place 0.4 mg under the tongue every 5 (five) minutes as needed. For chest pain.      . rosuvastatin (CRESTOR) 20 MG tablet Take 1 tablet (20 mg total) by mouth at bedtime.  30 tablet    . valsartan (DIOVAN) 160 MG tablet Take 1 tablet (160 mg total) by mouth daily.  30 tablet  11    Allergies:  Allergies  Allergen Reactions  . Ace Inhibitors     History reviewed. No pertinent family history.  Social History:  reports that he quit smoking about 35 years ago. He does not have any smokeless tobacco history on file. He reports that he does not drink alcohol or use illicit drugs.  Negative except for the above   Blood pressure 109/68, pulse 80, temperature 97.8 F (36.6 C), temperature source Oral, resp. rate 20, height 5\' 6"  (1.676 m), weight 83.235 kg (183 lb 8 oz), SpO2 96.00%. Head: Normocephalic, without obvious abnormality, atraumatic Neck: no adenopathy, no carotid bruit, no JVD, supple, symmetrical, trachea midline and thyroid not enlarged, symmetric, no tenderness/mass/nodules Resp: clear to auscultation bilaterally Cardio: regular rate and rhythm, S1, S2 normal, no murmur, click, rub or gallop GI: Abdomen soft distended with normoactive bowel sounds no hepatomegaly masses or guarding. There is mild diffuse tenderness Extremities: extremities normal, atraumatic, no cyanosis or edema  Results for orders placed during the hospital encounter of 07/04/11 (from the past 48 hour(s))  D-DIMER, QUANTITATIVE     Status: Abnormal   Collection Time   07/04/11  3:54 PM      Component Value Range Comment   D-Dimer, Quant 0.83 (*) 0.00 - 0.48 (ug/mL-FEU)   PRO B NATRIURETIC PEPTIDE     Status: Normal   Collection Time   07/04/11  4:01 PM      Component Value Range Comment     BNP, POC 107.4  0 - 125 (pg/mL)   TROPONIN I     Status: Normal   Collection Time   07/04/11  4:01 PM      Component Value Range Comment   Troponin I <0.30  <0.30 (ng/mL)   INFLUENZA PANEL BY PCR     Status: Normal   Collection Time   07/05/11  3:48 AM      Component Value Range Comment   Influenza A By PCR NEGATIVE  NEGATIVE     Influenza B By PCR NEGATIVE  NEGATIVE     H1N1 flu by pcr NOT DETECTED  NOT DETECTED    CBC     Status: Abnormal   Collection Time   07/05/11  3:49 AM      Component Value Range Comment   WBC 10.1  4.0 - 10.5 (K/uL)    RBC 5.40  4.22 - 5.81 (MIL/uL)    Hemoglobin 13.8  13.0 - 17.0 (g/dL)    HCT 45.4  09.8 - 11.9 (%)    MCV 76.1 (*) 78.0 - 100.0 (fL)    MCH 25.6 (*) 26.0 - 34.0 (pg)    MCHC 33.6  30.0 - 36.0 (g/dL)    RDW 14.7 (*) 82.9 -  15.5 (%)    Platelets 145 (*) 150 - 400 (K/uL)   COMPREHENSIVE METABOLIC PANEL     Status: Abnormal   Collection Time   07/05/11  3:49 AM      Component Value Range Comment   Sodium 133 (*) 135 - 145 (mEq/L)    Potassium 3.6  3.5 - 5.1 (mEq/L)    Chloride 99  96 - 112 (mEq/L)    CO2 25  19 - 32 (mEq/L)    Glucose, Bld 192 (*) 70 - 99 (mg/dL)    BUN 22  6 - 23 (mg/dL)    Creatinine, Ser 2.44  0.50 - 1.35 (mg/dL)    Calcium 8.2 (*) 8.4 - 10.5 (mg/dL)    Total Protein 6.3  6.0 - 8.3 (g/dL)    Albumin 3.1 (*) 3.5 - 5.2 (g/dL)    AST 17  0 - 37 (U/L)    ALT 18  0 - 53 (U/L)    Alkaline Phosphatase 58  39 - 117 (U/L)    Total Bilirubin 1.6 (*) 0.3 - 1.2 (mg/dL)    GFR calc non Af Amer 63 (*) >90 (mL/min)    GFR calc Af Amer 73 (*) >90 (mL/min)   CLOSTRIDIUM DIFFICILE BY PCR     Status: Normal   Collection Time   07/05/11  5:16 AM      Component Value Range Comment   C difficile by pcr NEGATIVE  NEGATIVE    GLUCOSE, CAPILLARY     Status: Abnormal   Collection Time   07/05/11  6:03 AM      Component Value Range Comment   Glucose-Capillary 187 (*) 70 - 99 (mg/dL)   LIPASE, BLOOD     Status: Normal   Collection  Time   07/05/11  9:24 AM      Component Value Range Comment   Lipase 11  11 - 59 (U/L)   CLOSTRIDIUM DIFFICILE BY PCR     Status: Normal   Collection Time   07/05/11 10:03 AM      Component Value Range Comment   C difficile by pcr NEGATIVE  NEGATIVE    FECAL LACTOFERRIN     Status: Normal   Collection Time   07/05/11 10:03 AM      Component Value Range Comment   Specimen Description STOOL      Special Requests NONE      Fecal Lactoferrin POSITIVE      Report Status 07/06/2011 FINAL     GLUCOSE, CAPILLARY     Status: Abnormal   Collection Time   07/05/11 11:38 AM      Component Value Range Comment   Glucose-Capillary 145 (*) 70 - 99 (mg/dL)   GLUCOSE, CAPILLARY     Status: Abnormal   Collection Time   07/05/11  4:56 PM      Component Value Range Comment   Glucose-Capillary 105 (*) 70 - 99 (mg/dL)    Comment 1 Documented in Chart      Comment 2 Notify RN     BASIC METABOLIC PANEL     Status: Abnormal   Collection Time   07/06/11  5:20 AM      Component Value Range Comment   Sodium 136  135 - 145 (mEq/L)    Potassium 3.1 (*) 3.5 - 5.1 (mEq/L)    Chloride 101  96 - 112 (mEq/L)    CO2 26  19 - 32 (mEq/L)    Glucose, Bld 123 (*) 70 - 99 (  mg/dL)    BUN 15  6 - 23 (mg/dL)    Creatinine, Ser 1.61  0.50 - 1.35 (mg/dL)    Calcium 8.4  8.4 - 10.5 (mg/dL)    GFR calc non Af Amer 66 (*) >90 (mL/min)    GFR calc Af Amer 77 (*) >90 (mL/min)   CBC     Status: Abnormal   Collection Time   07/06/11  5:20 AM      Component Value Range Comment   WBC 5.9  4.0 - 10.5 (K/uL)    RBC 5.20  4.22 - 5.81 (MIL/uL)    Hemoglobin 13.0  13.0 - 17.0 (g/dL)    HCT 09.6  04.5 - 40.9 (%)    MCV 75.2 (*) 78.0 - 100.0 (fL)    MCH 25.0 (*) 26.0 - 34.0 (pg)    MCHC 33.2  30.0 - 36.0 (g/dL)    RDW 81.1 (*) 91.4 - 15.5 (%)    Platelets 136 (*) 150 - 400 (K/uL)   GLUCOSE, CAPILLARY     Status: Abnormal   Collection Time   07/06/11  6:28 AM      Component Value Range Comment   Glucose-Capillary 126 (*) 70 -  99 (mg/dL)    Ct Angio Chest W/cm &/or Wo Cm  07/04/2011  *RADIOLOGY REPORT*  Clinical Data:  Chest pain with shortness of breath and fever. Question pulmonary embolism.  CT ANGIOGRAPHY CHEST WITH CONTRAST  Technique:  Multidetector CT imaging of the chest was performed using the standard protocol during bolus administration of intravenous contrast.  Multiplanar CT image reconstructions including MIPs were obtained to evaluate the vascular anatomy.  Contrast: OMNIPAQUE IOHEXOL 350 MG/ML IV SOLN  Comparison:  Chest radiographs today and 02/23/2009.  Abdominal CT 02/26/2005.  Findings:  The pulmonary arteries are well opacified with contrast. There is no evidence of acute pulmonary embolism.  Patient is status post CABG.  There is scattered atherosclerosis.  No enlarged mediastinal or hilar lymph nodes are present.  There is no significant pleural or pericardial effusion.  A small hiatal hernia is noted.  There is increased linear atelectasis in the left lower lobe.  The lungs are otherwise clear.  There is no confluent airspace opacity.  No acute findings are demonstrated within the upper abdomen. The liver demonstrates diffusely decreased density consistent with steatosis.  Review of the MIP images confirms the above findings.  IMPRESSION:  1.  No evidence of acute pulmonary embolism or other acute chest process. 2.  Mildly increased left lower lobe atelectasis. 3.  Hepatic steatosis.  Original Report Authenticated By: Gerrianne Scale, M.D.   Ct Abdomen Pelvis W Contrast  07/05/2011  *RADIOLOGY REPORT*  Clinical Data: Abdominal pain.  Nausea vomiting.  Diarrhea.  CT ABDOMEN AND PELVIS WITH CONTRAST  Technique:  Multidetector CT imaging of the abdomen and pelvis was performed following the standard protocol during bolus administration of intravenous contrast.  Contrast:  75 ml Omnipaque-300 and oral contrast  Comparison: 02/26/2005  Findings: Mild to moderate colonic wall thickening is seen involving  the ascending colon and hepatic flexure, consistent with right sided colitis.  There is no evidence of involvement of the terminal ileum.  Diverticulosis is seen involving the descending sigmoid colon, however there is no evidence of diverticulitis in these regions.  No evidence of abscess or free fluid.  No evidence of bowel obstruction.  Tiny hiatal hernia is seen.  Hepatic steatosis is demonstrated, however no liver masses are identified.  The gallbladder,  spleen, pancreas, and adrenal glands are normal in appearance.  Probable tiny less than 1 cm right renal cyst noted, however there is no evidence of renal mass or hydronephrosis.  Increased size of mild abdominal aortic aneurysm is seen measuring 3.2 cm in greatest diameter.  No evidence of aneurysm leak or rupture.  No soft tissue masses or lymphadenopathy identified. Mildly enlarged prostate gland is stable.  IMPRESSION:  1.  Mild to moderate right-sided colitis.  No evidence of abscess or other complication. 2.  Increased size of 3.2 cm infrarenal abdominal aortic aneurysm. 3.  Stable hepatic steatosis, tiny hiatal hernia, left-sided colonic diverticulosis, and mildly enlarged prostate.  Original Report Authenticated By: Danae Orleans, M.D.   Dg Chest Port 1 View  07/04/2011  *RADIOLOGY REPORT*  Clinical Data: Chest pain, weakness, nausea  PORTABLE CHEST - 1 VIEW  Comparison: 02/23/2009  Findings: Cardiomediastinal silhouette is stable.  Status post CABG again noted.  No acute infiltrate or pleural effusion.  No pulmonary edema.  Bony thorax is stable.  IMPRESSION: Status post CABG.  No active disease.  No significant change.  Original Report Authenticated By: Natasha Mead, M.D.    Assessment: Presentation compatible with an acute bacterial colitis, cannot rule out ischemic colitis or inflammatory bowel disease. Plan:  1. Agree with Cipro and Flagyl. 2. Obtain stool for enteric pathogens if not already done 3. Monitor for clinical improvement and if  none pursue colonoscopy acutely, otherwise is probably due for colon cancer screening anyway and will pursue colonoscopy electively after recovery. Ilaisaane Marts C 07/06/2011, 3:53 PM

## 2011-07-07 LAB — CBC
Hemoglobin: 11.8 g/dL — ABNORMAL LOW (ref 13.0–17.0)
MCV: 73.7 fL — ABNORMAL LOW (ref 78.0–100.0)
Platelets: 121 10*3/uL — ABNORMAL LOW (ref 150–400)
RBC: 4.71 MIL/uL (ref 4.22–5.81)
WBC: 5 10*3/uL (ref 4.0–10.5)

## 2011-07-07 LAB — GLUCOSE, CAPILLARY: Glucose-Capillary: 108 mg/dL — ABNORMAL HIGH (ref 70–99)

## 2011-07-07 LAB — BASIC METABOLIC PANEL
CO2: 27 mEq/L (ref 19–32)
Calcium: 7.9 mg/dL — ABNORMAL LOW (ref 8.4–10.5)
Glucose, Bld: 104 mg/dL — ABNORMAL HIGH (ref 70–99)
Potassium: 2.7 mEq/L — CL (ref 3.5–5.1)
Sodium: 136 mEq/L (ref 135–145)

## 2011-07-07 MED ORDER — CHOLESTYRAMINE LIGHT 4 G PO PACK
4.0000 g | PACK | Freq: Once | ORAL | Status: AC
  Administered 2011-07-07: 4 g via ORAL
  Filled 2011-07-07: qty 1

## 2011-07-07 MED ORDER — LOPERAMIDE HCL 2 MG PO CAPS
2.0000 mg | ORAL_CAPSULE | Freq: Two times a day (BID) | ORAL | Status: DC
  Administered 2011-07-07 – 2011-07-09 (×4): 2 mg via ORAL
  Filled 2011-07-07 (×6): qty 1

## 2011-07-07 MED ORDER — POTASSIUM CHLORIDE CRYS ER 20 MEQ PO TBCR
40.0000 meq | EXTENDED_RELEASE_TABLET | Freq: Every day | ORAL | Status: DC
  Administered 2011-07-07 – 2011-07-08 (×2): 40 meq via ORAL
  Filled 2011-07-07 (×4): qty 2

## 2011-07-07 MED ORDER — POTASSIUM CHLORIDE 10 MEQ/100ML IV SOLN
10.0000 meq | INTRAVENOUS | Status: AC
  Administered 2011-07-07 (×3): 10 meq via INTRAVENOUS
  Filled 2011-07-07 (×3): qty 100

## 2011-07-07 NOTE — Progress Notes (Signed)
[facility] Progress Note  Subjective: Nausea abdominal pain and diarrhea are all partially improved  Objective: Vital signs in last 24 hours: Temp:  [97.5 F (36.4 C)-97.8 F (36.6 C)] 97.5 F (36.4 C) (12/07 0552) Pulse Rate:  [79-84] 79  (12/07 0552) Resp:  [20] 20  (12/07 0552) BP: (109-129)/(68-78) 117/75 mmHg (12/07 0552) SpO2:  [96 %-98 %] 96 % (12/07 0552) Weight change:    PE: Abdomen soft nondistended with mild left lower quadrant and right lower quadrant tenderness  Lab Results: Results for orders placed during the hospital encounter of 07/04/11 (from the past 24 hour(s))  GLUCOSE, CAPILLARY     Status: Abnormal   Collection Time   07/07/11  6:01 AM      Component Value Range   Glucose-Capillary 108 (*) 70 - 99 (mg/dL)  BASIC METABOLIC PANEL     Status: Abnormal   Collection Time   07/07/11  6:30 AM      Component Value Range   Sodium 136  135 - 145 (mEq/L)   Potassium 2.7 (*) 3.5 - 5.1 (mEq/L)   Chloride 103  96 - 112 (mEq/L)   CO2 27  19 - 32 (mEq/L)   Glucose, Bld 104 (*) 70 - 99 (mg/dL)   BUN 10  6 - 23 (mg/dL)   Creatinine, Ser 1.61  0.50 - 1.35 (mg/dL)   Calcium 7.9 (*) 8.4 - 10.5 (mg/dL)   GFR calc non Af Amer 66 (*) >90 (mL/min)   GFR calc Af Amer 76 (*) >90 (mL/min)  CBC     Status: Abnormal   Collection Time   07/07/11  6:30 AM      Component Value Range   WBC 5.0  4.0 - 10.5 (K/uL)   RBC 4.71  4.22 - 5.81 (MIL/uL)   Hemoglobin 11.8 (*) 13.0 - 17.0 (g/dL)   HCT 09.6 (*) 04.5 - 52.0 (%)   MCV 73.7 (*) 78.0 - 100.0 (fL)   MCH 25.1 (*) 26.0 - 34.0 (pg)   MCHC 34.0  30.0 - 36.0 (g/dL)   RDW 40.9  81.1 - 91.4 (%)   Platelets 121 (*) 150 - 400 (K/uL)    Studies/Results: Stool for enteric pathogens pending    Assessment: Right-sided colitis presumed infectious, seemingly responding to Cipro and Flagyl  Plan: Continue antibiotics and monitor for further improvement. If symptoms do not resolve will need colonoscopy which he needed  some point anyway for update of colon cancer screening. Suspect this will probably be able to be deferred to outpatient setting.  Shontavia Mickel C 07/07/2011, 11:17 AM

## 2011-07-07 NOTE — Progress Notes (Signed)
Benjamin Harrison  ZOX:[id number]  DOB: November 19, 1934  DOA: 07/04/2011  Subjective: S: Still complaining of watery diarrhea, per patient, 5 times overnight, no significant abdominal pain or vomiting, afebrile. Wants diet upgraded.  Objective: Weight change:   Intake/Output Summary (Last 24 hours) at 07/07/11 0845 Last data filed at 07/07/11 0700  Gross per 24 hour  Intake   5560 ml  Output    925 ml  Net   4635 ml   Blood pressure 117/75, pulse 79, temperature 97.5 F (36.4 C), temperature source Oral, resp. rate 20, height 5\' 6"  (1.676 m), weight 83.235 kg (183 lb 8 oz), SpO2 96.00%.  Physical Exam: General: Alert and awake, oriented x3, not in any acute distress. HEENT: anicteric sclera, pupils reactive to light and accommodation, EOMI CVS: S1-S2 clear, no murmur rubs or gallops Chest: clear to auscultation bilaterally, no wheezing, rales or rhonchi Abdomen: soft nontender, nondistended, normal bowel sounds, no organomegaly Extremities: no cyanosis, clubbing or edema noted bilaterally Neuro: , no focal neurological deficits  Lab Results: Basic Metabolic Panel:  Lab 07/07/11 8119 07/06/11 0520  NA 136 136  K 2.7* 3.1*  CL 103 101  CO2 27 26  GLUCOSE 104* 123*  BUN 10 15  CREATININE 1.08 1.07  CALCIUM 7.9* 8.4  MG -- --  PHOS -- --   Liver Function Tests:  Lab 07/05/11 0349  AST 17  ALT 18  ALKPHOS 58  BILITOT 1.6*  PROT 6.3  ALBUMIN 3.1*    Lab 07/05/11 0924  LIPASE 11  AMYLASE --   CBC:  Lab 07/07/11 0630 07/06/11 0520 07/04/11 1553  WBC 5.0 5.9 --  NEUTROABS -- -- 12.2*  HGB 11.8* 13.0 --  HCT 34.7* 39.1 --  MCV 73.7* 75.2* --  PLT 121* 136* --   Cardiac Enzymes:  Lab 07/04/11 1601  CKTOTAL --  CKMB --  CKMBINDEX --  TROPONINI <0.30   BNP:  Lab 07/04/11 1601  POCBNP 107.4   CBG:  Lab 07/07/11 0601 07/06/11 0628 07/05/11 1656 07/05/11 1138 07/05/11 0603  GLUCAP 108* 126* 105* 145* 187*     Micro Results: Recent Results (from the  past 240 hour(s))  CLOSTRIDIUM DIFFICILE BY PCR     Status: Normal   Collection Time   07/05/11  5:16 AM      Component Value Range Status Comment   C difficile by pcr NEGATIVE  NEGATIVE  Final   CLOSTRIDIUM DIFFICILE BY PCR     Status: Normal   Collection Time   07/05/11 10:03 AM      Component Value Range Status Comment   C difficile by pcr NEGATIVE  NEGATIVE  Final   OVA AND PARASITE EXAMINATION     Status: Normal   Collection Time   07/05/11 10:03 AM      Component Value Range Status Comment   Specimen Description STOOL   Final    Special Requests NONE   Final    Ova and parasites NO OVA OR PARASITES SEEN MODERATE WBC   Final    Report Status 07/06/2011 FINAL   Final     Studies/Results: Ct Angio Chest W/cm &/or Wo Cm  07/04/2011  *RADIOLOGY REPORT*  Clinical Data:  Chest pain with shortness of breath and fever. Question pulmonary embolism.  CT ANGIOGRAPHY CHEST WITH CONTRAST  Technique:  Multidetector CT imaging of the chest was performed using the standard protocol during bolus administration of intravenous contrast.  Multiplanar CT image reconstructions including MIPs were obtained to  evaluate the vascular anatomy.  Contrast: OMNIPAQUE IOHEXOL 350 MG/ML IV SOLN  Comparison:  Chest radiographs today and 02/23/2009.  Abdominal CT 02/26/2005.  Findings:  The pulmonary arteries are well opacified with contrast. There is no evidence of acute pulmonary embolism.  Patient is status post CABG.  There is scattered atherosclerosis.  No enlarged mediastinal or hilar lymph nodes are present.  There is no significant pleural or pericardial effusion.  A small hiatal hernia is noted.  There is increased linear atelectasis in the left lower lobe.  The lungs are otherwise clear.  There is no confluent airspace opacity.  No acute findings are demonstrated within the upper abdomen. The liver demonstrates diffusely decreased density consistent with steatosis.  Review of the MIP images confirms the above  findings.  IMPRESSION:  1.  No evidence of acute pulmonary embolism or other acute chest process. 2.  Mildly increased left lower lobe atelectasis. 3.  Hepatic steatosis.  Original Report Authenticated By: Gerrianne Scale, M.D.   Ct Abdomen Pelvis W Contrast  07/05/2011  *RADIOLOGY REPORT*  Clinical Data: Abdominal pain.  Nausea vomiting.  Diarrhea.  CT ABDOMEN AND PELVIS WITH CONTRAST  Technique:  Multidetector CT imaging of the abdomen and pelvis was performed following the standard protocol during bolus administration of intravenous contrast.  Contrast:  75 ml Omnipaque-300 and oral contrast  Comparison: 02/26/2005  Findings: Mild to moderate colonic wall thickening is seen involving the ascending colon and hepatic flexure, consistent with right sided colitis.  There is no evidence of involvement of the terminal ileum.  Diverticulosis is seen involving the descending sigmoid colon, however there is no evidence of diverticulitis in these regions.  No evidence of abscess or free fluid.  No evidence of bowel obstruction.  Tiny hiatal hernia is seen.  Hepatic steatosis is demonstrated, however no liver masses are identified.  The gallbladder, spleen, pancreas, and adrenal glands are normal in appearance.  Probable tiny less than 1 cm right renal cyst noted, however there is no evidence of renal mass or hydronephrosis.  Increased size of mild abdominal aortic aneurysm is seen measuring 3.2 cm in greatest diameter.  No evidence of aneurysm leak or rupture.  No soft tissue masses or lymphadenopathy identified. Mildly enlarged prostate gland is stable.  IMPRESSION:  1.  Mild to moderate right-sided colitis.  No evidence of abscess or other complication. 2.  Increased size of 3.2 cm infrarenal abdominal aortic aneurysm. 3.  Stable hepatic steatosis, tiny hiatal hernia, left-sided colonic diverticulosis, and mildly enlarged prostate.  Original Report Authenticated By: Danae Orleans, M.D.   Dg Chest Port 1  View  07/04/2011  *RADIOLOGY REPORT*  Clinical Data: Chest pain, weakness, nausea  PORTABLE CHEST - 1 VIEW  Comparison: 02/23/2009  Findings: Cardiomediastinal silhouette is stable.  Status post CABG again noted.  No acute infiltrate or pleural effusion.  No pulmonary edema.  Bony thorax is stable.  IMPRESSION: Status post CABG.  No active disease.  No significant change.  Original Report Authenticated By: Natasha Mead, M.D.    Medications: Scheduled Meds:    . aspirin  325 mg Oral Daily  . cholestyramine light  4 g Oral Once  . ciprofloxacin  400 mg Intravenous Q12H  . clopidogrel  75 mg Oral Q breakfast  . enoxaparin  40 mg Subcutaneous Daily  . famotidine  20 mg Oral Daily  . glimepiride  2 mg Oral QAC breakfast  . hydrochlorothiazide  25 mg Oral Daily  . levothyroxine  88 mcg Oral Daily  . loperamide  2 mg Oral Once  . loperamide  2 mg Oral BID  . metoprolol  25 mg Oral BID  . metronidazole  500 mg Intravenous Q8H  . olmesartan  20 mg Oral Daily  . potassium chloride  10 mEq Intravenous Q1 Hr x 3  . potassium chloride  40 mEq Oral Daily  . rosuvastatin  20 mg Oral QHS  . saccharomyces boulardii  250 mg Oral BID  . DISCONTD: albuterol  2.5 mg Nebulization Q6H   Continuous Infusions:    . sodium chloride 100 mL/hr at 07/07/11 0347     Assessment/Plan: Principal Problem:  *Right-sided colitis with intractable diarrhea: - C. difficile negative, stool studies negative, decrease IV fluids, advance diet to solids - Added Imodium and cholestyramine, continue ciprofloxacin and Flagyl, florastor - GI consultation appreciated  Hypokalemia: Replaced, recheck in 4 hours  Active Problems:  Hypertension: Stable  Hypothyroidism: Continue Synthroid  Hyperlipidemia: Continue Crestor  Diabetes mellitus: Blood sugars controlled  history of CAD: stable, cont ASA, plavix, BB, benicar  Weakness generalized: Obtain PT/OT consultation when diarrhea is somewhat improved.    LOS: 3 days    RAI,RIPUDEEP 07/07/2011, 8:45 AM

## 2011-07-08 LAB — CBC
HCT: 36.2 % — ABNORMAL LOW (ref 39.0–52.0)
Hemoglobin: 12.3 g/dL — ABNORMAL LOW (ref 13.0–17.0)
RBC: 4.91 MIL/uL (ref 4.22–5.81)

## 2011-07-08 LAB — BASIC METABOLIC PANEL
Chloride: 106 mEq/L (ref 96–112)
GFR calc Af Amer: >90 mL/min (ref >90)
GFR calc non Af Amer: 80 mL/min — ABNORMAL LOW (ref >90)
Glucose, Bld: 97 mg/dL (ref 70–99)
Potassium: 3 mEq/L — ABNORMAL LOW (ref 3.5–5.1)
Sodium: 140 mEq/L (ref 135–145)

## 2011-07-08 LAB — GLUCOSE, CAPILLARY

## 2011-07-08 MED ORDER — CIPROFLOXACIN HCL 500 MG PO TABS
500.0000 mg | ORAL_TABLET | Freq: Two times a day (BID) | ORAL | Status: DC
  Administered 2011-07-08 – 2011-07-09 (×2): 500 mg via ORAL
  Filled 2011-07-08 (×5): qty 1

## 2011-07-08 MED ORDER — POTASSIUM CHLORIDE 10 MEQ/100ML IV SOLN
10.0000 meq | INTRAVENOUS | Status: AC
  Administered 2011-07-08 (×2): 10 meq via INTRAVENOUS
  Filled 2011-07-08 (×3): qty 100

## 2011-07-08 NOTE — Progress Notes (Signed)
Benjamin Harrison  RUE:[id number]  DOB: September 20, 1934  DOA: 07/04/2011  Subjective: S: Diarrhea resolved today, tolerating solids.  Objective: Weight change:   Intake/Output Summary (Last 24 hours) at 07/08/11 1021 Last data filed at 07/08/11 0804  Gross per 24 hour  Intake 2546.25 ml  Output      0 ml  Net 2546.25 ml   Blood pressure 96/58, pulse 69, temperature 97.7 F (36.5 C), temperature source Oral, resp. rate 18, height 5\' 6"  (1.676 m), weight 83.235 kg (183 lb 8 oz), SpO2 95.00%.  Physical Exam: General: Alert and awake, oriented x3, not in any acute distress. HEENT: anicteric sclera, pupils reactive to light and accommodation, EOMI CVS: S1-S2 clear, no murmur rubs or gallops Chest: clear to auscultation bilaterally, no wheezing, rales or rhonchi Abdomen: soft nontender, nondistended, normal bowel sounds, no organomegaly Extremities: no cyanosis, clubbing or edema noted bilaterally Neuro: , no focal neurological deficits  Lab Results: Basic Metabolic Panel:  Lab 07/08/11 1478 07/07/11 1125 07/07/11 0630  NA 140 -- 136  K 3.0* 3.0* --  CL 106 -- 103  CO2 28 -- 27  GLUCOSE 97 -- 104*  BUN 12 -- 10  CREATININE 0.96 -- 1.08  CALCIUM 8.2* -- 7.9*  MG -- -- --  PHOS -- -- --   Liver Function Tests:  Lab 07/05/11 0349  AST 17  ALT 18  ALKPHOS 58  BILITOT 1.6*  PROT 6.3  ALBUMIN 3.1*    Lab 07/05/11 0924  LIPASE 11  AMYLASE --   CBC:  Lab 07/08/11 0500 07/07/11 0630 07/04/11 1553  WBC 5.4 5.0 --  NEUTROABS -- -- 12.2*  HGB 12.3* 11.8* --  HCT 36.2* 34.7* --  MCV 73.7* 73.7* --  PLT 139* 121* --   Cardiac Enzymes:  Lab 07/04/11 1601  CKTOTAL --  CKMB --  CKMBINDEX --  TROPONINI <0.30   BNP:  Lab 07/04/11 1601  POCBNP 107.4   CBG:  Lab 07/08/11 0552 07/07/11 0601 07/06/11 0628 07/05/11 1656 07/05/11 1138  GLUCAP 103* 108* 126* 105* 145*     Micro Results: Recent Results (from the past 240 hour(s))  CLOSTRIDIUM DIFFICILE BY PCR      Status: Normal   Collection Time   07/05/11  5:16 AM      Component Value Range Status Comment   C difficile by pcr NEGATIVE  NEGATIVE  Final   STOOL CULTURE     Status: Normal (Preliminary result)   Collection Time   07/05/11  9:51 AM      Component Value Range Status Comment   Specimen Description STOOL   Final    Special Requests NONE   Final    Culture Culture reincubated for better growth   Final    Report Status PENDING   Incomplete   CLOSTRIDIUM DIFFICILE BY PCR     Status: Normal   Collection Time   07/05/11 10:03 AM      Component Value Range Status Comment   C difficile by pcr NEGATIVE  NEGATIVE  Final   OVA AND PARASITE EXAMINATION     Status: Normal   Collection Time   07/05/11 10:03 AM      Component Value Range Status Comment   Specimen Description STOOL   Final    Special Requests NONE   Final    Ova and parasites NO OVA OR PARASITES SEEN MODERATE WBC   Final    Report Status 07/06/2011 FINAL   Final  Studies/Results: Ct Angio Chest W/cm &/or Wo Cm  07/04/2011  *RADIOLOGY REPORT*  Clinical Data:  Chest pain with shortness of breath and fever. Question pulmonary embolism.  CT ANGIOGRAPHY CHEST WITH CONTRAST  Technique:  Multidetector CT imaging of the chest was performed using the standard protocol during bolus administration of intravenous contrast.  Multiplanar CT image reconstructions including MIPs were obtained to evaluate the vascular anatomy.  Contrast: OMNIPAQUE IOHEXOL 350 MG/ML IV SOLN  Comparison:  Chest radiographs today and 02/23/2009.  Abdominal CT 02/26/2005.  Findings:  The pulmonary arteries are well opacified with contrast. There is no evidence of acute pulmonary embolism.  Patient is status post CABG.  There is scattered atherosclerosis.  No enlarged mediastinal or hilar lymph nodes are present.  There is no significant pleural or pericardial effusion.  A small hiatal hernia is noted.  There is increased linear atelectasis in the left lower lobe.   The lungs are otherwise clear.  There is no confluent airspace opacity.  No acute findings are demonstrated within the upper abdomen. The liver demonstrates diffusely decreased density consistent with steatosis.  Review of the MIP images confirms the above findings.  IMPRESSION:  1.  No evidence of acute pulmonary embolism or other acute chest process. 2.  Mildly increased left lower lobe atelectasis. 3.  Hepatic steatosis.  Original Report Authenticated By: Gerrianne Scale, M.D.   Ct Abdomen Pelvis W Contrast  07/05/2011  *RADIOLOGY REPORT*  Clinical Data: Abdominal pain.  Nausea vomiting.  Diarrhea.  CT ABDOMEN AND PELVIS WITH CONTRAST  Technique:  Multidetector CT imaging of the abdomen and pelvis was performed following the standard protocol during bolus administration of intravenous contrast.  Contrast:  75 ml Omnipaque-300 and oral contrast  Comparison: 02/26/2005  Findings: Mild to moderate colonic wall thickening is seen involving the ascending colon and hepatic flexure, consistent with right sided colitis.  There is no evidence of involvement of the terminal ileum.  Diverticulosis is seen involving the descending sigmoid colon, however there is no evidence of diverticulitis in these regions.  No evidence of abscess or free fluid.  No evidence of bowel obstruction.  Tiny hiatal hernia is seen.  Hepatic steatosis is demonstrated, however no liver masses are identified.  The gallbladder, spleen, pancreas, and adrenal glands are normal in appearance.  Probable tiny less than 1 cm right renal cyst noted, however there is no evidence of renal mass or hydronephrosis.  Increased size of mild abdominal aortic aneurysm is seen measuring 3.2 cm in greatest diameter.  No evidence of aneurysm leak or rupture.  No soft tissue masses or lymphadenopathy identified. Mildly enlarged prostate gland is stable.  IMPRESSION:  1.  Mild to moderate right-sided colitis.  No evidence of abscess or other complication. 2.   Increased size of 3.2 cm infrarenal abdominal aortic aneurysm. 3.  Stable hepatic steatosis, tiny hiatal hernia, left-sided colonic diverticulosis, and mildly enlarged prostate.  Original Report Authenticated By: Danae Orleans, M.D.   Dg Chest Port 1 View  07/04/2011  *RADIOLOGY REPORT*  Clinical Data: Chest pain, weakness, nausea  PORTABLE CHEST - 1 VIEW  Comparison: 02/23/2009  Findings: Cardiomediastinal silhouette is stable.  Status post CABG again noted.  No acute infiltrate or pleural effusion.  No pulmonary edema.  Bony thorax is stable.  IMPRESSION: Status post CABG.  No active disease.  No significant change.  Original Report Authenticated By: Natasha Mead, M.D.    Medications: Scheduled Meds:    . aspirin  325 mg  Oral Daily  . cholestyramine light  4 g Oral Once  . ciprofloxacin  500 mg Oral BID  . clopidogrel  75 mg Oral Q breakfast  . enoxaparin  40 mg Subcutaneous Daily  . famotidine  20 mg Oral Daily  . glimepiride  2 mg Oral QAC breakfast  . levothyroxine  88 mcg Oral Daily  . loperamide  2 mg Oral BID  . potassium chloride  10 mEq Intravenous Q1 Hr x 3  . potassium chloride  10 mEq Intravenous Q1 Hr x 3  . potassium chloride  40 mEq Oral Daily  . rosuvastatin  20 mg Oral QHS  . saccharomyces boulardii  250 mg Oral BID  . DISCONTD: ciprofloxacin  400 mg Intravenous Q12H  . DISCONTD: hydrochlorothiazide  25 mg Oral Daily  . DISCONTD: metoprolol  25 mg Oral BID  . DISCONTD: metronidazole  500 mg Intravenous Q8H  . DISCONTD: olmesartan  20 mg Oral Daily   Continuous Infusions:    . DISCONTD: sodium chloride 75 mL/hr at 07/07/11 1112     Assessment/Plan: Principal Problem:  *Right-sided colitis with intractable diarrhea: Improving, diarrhea resolved  - C. difficile negative, stool studies negative, DC IV fluids, continue solids  - Continue ciprofloxacin change to by mouth, DC'd the Flagyl today, out of bed and ambulate today.  - GI consultation  appreciated  Hypokalemia: Replaced  Active Problems:  Hypertension: Stable  Hypothyroidism: Continue Synthroid  Hyperlipidemia: Continue Crestor  Diabetes mellitus: Blood sugars controlled  history of CAD: stable, cont ASA, plavix, BB, benicar  Weakness generalized: PT OT today  Likely DC home in a.m.   LOS: 4 days   RAI,RIPUDEEP 07/08/2011, 10:21 AM

## 2011-07-08 NOTE — Progress Notes (Signed)
[facility] Progress Note  Subjective: Patient feeling much better no bowel movements overnight. A solid diet today  Objective: Vital signs in last 24 hours: Temp:  [97.7 F (36.5 C)-98.2 F (36.8 C)] 97.7 F (36.5 C) (12/08 0557) Pulse Rate:  [69-75] 69  (12/08 0557) Resp:  [18-20] 18  (12/08 0557) BP: (96-116)/(58-74) 96/58 mmHg (12/08 0557) SpO2:  [95 %-99 %] 95 % (12/08 0557) Weight change:    PE: Abdomen soft  Lab Results: Results for orders placed during the hospital encounter of 07/04/11 (from the past 24 hour(s))  POTASSIUM     Status: Abnormal   Collection Time   07/07/11 11:25 AM      Component Value Range   Potassium 3.0 (*) 3.5 - 5.1 (mEq/L)  BASIC METABOLIC PANEL     Status: Abnormal   Collection Time   07/08/11  5:00 AM      Component Value Range   Sodium 140  135 - 145 (mEq/L)   Potassium 3.0 (*) 3.5 - 5.1 (mEq/L)   Chloride 106  96 - 112 (mEq/L)   CO2 28  19 - 32 (mEq/L)   Glucose, Bld 97  70 - 99 (mg/dL)   BUN 12  6 - 23 (mg/dL)   Creatinine, Ser 1.61  0.50 - 1.35 (mg/dL)   Calcium 8.2 (*) 8.4 - 10.5 (mg/dL)   GFR calc non Af Amer 80 (*) >90 (mL/min)   GFR calc Af Amer >90  >90 (mL/min)  CBC     Status: Abnormal   Collection Time   07/08/11  5:00 AM      Component Value Range   WBC 5.4  4.0 - 10.5 (K/uL)   RBC 4.91  4.22 - 5.81 (MIL/uL)   Hemoglobin 12.3 (*) 13.0 - 17.0 (g/dL)   HCT 09.6 (*) 04.5 - 52.0 (%)   MCV 73.7 (*) 78.0 - 100.0 (fL)   MCH 25.1 (*) 26.0 - 34.0 (pg)   MCHC 34.0  30.0 - 36.0 (g/dL)   RDW 40.9  81.1 - 91.4 (%)   Platelets 139 (*) 150 - 400 (K/uL)  GLUCOSE, CAPILLARY     Status: Abnormal   Collection Time   07/08/11  5:52 AM      Component Value Range   Glucose-Capillary 103 (*) 70 - 99 (mg/dL)   Comment 1 Documented in Chart     Comment 2 Notify RN      Studies/Results: No results found.    Assessment: Right-sided colitis, clinically improving  Plan: We'll discontinue Flagyl and switch Cipro to by  mouth. Could probably go home today or tomorrow. I'll arranged appointment with me for clinical followup and to update his colonoscopy. Please call further GI input needed.  Yalda Herd C 07/08/2011, 10:02 AM

## 2011-07-09 LAB — BASIC METABOLIC PANEL
BUN: 16 mg/dL (ref 6–23)
Chloride: 106 mEq/L (ref 96–112)
Creatinine, Ser: 0.96 mg/dL (ref 0.50–1.35)
GFR calc Af Amer: >90 mL/min (ref >90)
Glucose, Bld: 80 mg/dL (ref 70–99)

## 2011-07-09 MED ORDER — SACCHAROMYCES BOULARDII 250 MG PO CAPS: 250.0000 mg | ORAL_CAPSULE | Freq: Two times a day (BID) | ORAL | Status: AC

## 2011-07-09 MED ORDER — CIPROFLOXACIN HCL 500 MG PO TABS: 500.0000 mg | ORAL_TABLET | Freq: Two times a day (BID) | ORAL | Status: AC

## 2011-07-09 MED ORDER — POTASSIUM CHLORIDE CRYS ER 20 MEQ PO TBCR
40.0000 meq | EXTENDED_RELEASE_TABLET | Freq: Once | ORAL | Status: AC
  Administered 2011-07-09: 40 meq via ORAL

## 2011-07-09 NOTE — Progress Notes (Signed)
Pt. Discharged 07/09/2011  9:55 AM Discharge instructions reviewed with patient/family. Patient/family verbalized understanding. All Rx's given. Questions answered as needed. Pt. Discharged to home with family/self. Taken off unit via W/C. Elease Hashimoto

## 2011-07-09 NOTE — Discharge Summary (Signed)
Physician Discharge Summary  Patient ID: Benjamin Harrison MRN: [id number] DOB/AGE: 74-Nov-1936 74 y.o. Primary Care Physician:ELKINS,WILSON [name], MD Admit date: 07/04/2011 Discharge date: 07/09/2011    Discharge Diagnoses:  1. Right-sided colitis, improving. 2. Hypertension, stable. 3. Diabetes mellitus. 4. Hypothyroidism. 5. Coronary artery disease, stable.   Current Discharge Medication List    START taking these medications   Details  ciprofloxacin (CIPRO) 500 MG tablet Take 1 tablet (500 mg total) by mouth 2 (two) times daily. Qty: 10 tablet, Refills: 0    saccharomyces boulardii (FLORASTOR) 250 MG capsule Take 1 capsule (250 mg total) by mouth 2 (two) times daily. Qty: 30 capsule, Refills: 0      CONTINUE these medications which have NOT CHANGED   Details  aspirin 325 MG tablet Take 325 mg by mouth daily.      clopidogrel (PLAVIX) 75 MG tablet TAKE 1 TABLET BY MOUTH EVERY DAY Qty: 30 tablet, Refills: 5    famotidine (PEPCID) 20 MG tablet Take 1 tablet (20 mg total) by mouth daily. Qty: 30 tablet    glimepiride (AMARYL) 2 MG tablet Take 2 mg by mouth daily before breakfast.      levothyroxine (SYNTHROID, LEVOTHROID) 88 MCG tablet Take 88 mcg by mouth daily.      metoprolol (LOPRESSOR) 50 MG tablet Take 25 mg by mouth 2 (two) times daily.      nitroGLYCERIN (NITROSTAT) 0.4 MG SL tablet Place 0.4 mg under the tongue every 5 (five) minutes as needed. For chest pain.    rosuvastatin (CRESTOR) 20 MG tablet Take 1 tablet (20 mg total) by mouth at bedtime. Qty: 30 tablet    valsartan (DIOVAN) 160 MG tablet Take 1 tablet (160 mg total) by mouth daily. Qty: 30 tablet, Refills: 11      STOP taking these medications     hydrochlorothiazide 25 MG tablet         Discharged Condition: Improved and stable.    Consults: Gastroenterology, Dr. Madilyn Fireman.  Significant Diagnostic Studies: Ct Angio Chest W/cm &/or Wo Cm  07/04/2011  *RADIOLOGY REPORT*  Clinical Data:   Chest pain with shortness of breath and fever. Question pulmonary embolism.  CT ANGIOGRAPHY CHEST WITH CONTRAST  Technique:  Multidetector CT imaging of the chest was performed using the standard protocol during bolus administration of intravenous contrast.  Multiplanar CT image reconstructions including MIPs were obtained to evaluate the vascular anatomy.  Contrast: OMNIPAQUE IOHEXOL 350 MG/ML IV SOLN  Comparison:  Chest radiographs today and 02/23/2009.  Abdominal CT 02/26/2005.  Findings:  The pulmonary arteries are well opacified with contrast. There is no evidence of acute pulmonary embolism.  Patient is status post CABG.  There is scattered atherosclerosis.  No enlarged mediastinal or hilar lymph nodes are present.  There is no significant pleural or pericardial effusion.  A small hiatal hernia is noted.  There is increased linear atelectasis in the left lower lobe.  The lungs are otherwise clear.  There is no confluent airspace opacity.  No acute findings are demonstrated within the upper abdomen. The liver demonstrates diffusely decreased density consistent with steatosis.  Review of the MIP images confirms the above findings.  IMPRESSION:  1.  No evidence of acute pulmonary embolism or other acute chest process. 2.  Mildly increased left lower lobe atelectasis. 3.  Hepatic steatosis.  Original Report Authenticated By: Gerrianne Scale, M.D.   Ct Abdomen Pelvis W Contrast  07/05/2011  *RADIOLOGY REPORT*  Clinical Data: Abdominal pain.  Nausea vomiting.  Diarrhea.  CT ABDOMEN AND PELVIS WITH CONTRAST  Technique:  Multidetector CT imaging of the abdomen and pelvis was performed following the standard protocol during bolus administration of intravenous contrast.  Contrast:  75 ml Omnipaque-300 and oral contrast  Comparison: 02/26/2005  Findings: Mild to moderate colonic wall thickening is seen involving the ascending colon and hepatic flexure, consistent with right sided colitis.  There is no evidence  of involvement of the terminal ileum.  Diverticulosis is seen involving the descending sigmoid colon, however there is no evidence of diverticulitis in these regions.  No evidence of abscess or free fluid.  No evidence of bowel obstruction.  Tiny hiatal hernia is seen.  Hepatic steatosis is demonstrated, however no liver masses are identified.  The gallbladder, spleen, pancreas, and adrenal glands are normal in appearance.  Probable tiny less than 1 cm right renal cyst noted, however there is no evidence of renal mass or hydronephrosis.  Increased size of mild abdominal aortic aneurysm is seen measuring 3.2 cm in greatest diameter.  No evidence of aneurysm leak or rupture.  No soft tissue masses or lymphadenopathy identified. Mildly enlarged prostate gland is stable.  IMPRESSION:  1.  Mild to moderate right-sided colitis.  No evidence of abscess or other complication. 2.  Increased size of 3.2 cm infrarenal abdominal aortic aneurysm. 3.  Stable hepatic steatosis, tiny hiatal hernia, left-sided colonic diverticulosis, and mildly enlarged prostate.  Original Report Authenticated By: Danae Orleans, M.D.   Dg Chest Port 1 View  07/04/2011  *RADIOLOGY REPORT*  Clinical Data: Chest pain, weakness, nausea  PORTABLE CHEST - 1 VIEW  Comparison: 02/23/2009  Findings: Cardiomediastinal silhouette is stable.  Status post CABG again noted.  No acute infiltrate or pleural effusion.  No pulmonary edema.  Bony thorax is stable.  IMPRESSION: Status post CABG.  No active disease.  No significant change.  Original Report Authenticated By: Natasha Mead, M.D.    Lab Results: Basic Metabolic Panel:  Basename 07/08/11 0500 07/07/11 1125 07/07/11 0630  NA 140 -- 136  K 3.0* 3.0* --  CL 106 -- 103  CO2 28 -- 27  GLUCOSE 97 -- 104*  BUN 12 -- 10  CREATININE 0.96 -- 1.08  CALCIUM 8.2* -- 7.9*  MG -- -- --  PHOS -- -- --       CBC:  Basename 07/08/11 0500 07/07/11 0630  WBC 5.4 5.0  NEUTROABS -- --  HGB 12.3* 11.8*    HCT 36.2* 34.7*  MCV 73.7* 73.7*  PLT 139* 121*    Recent Results (from the past 240 hour(s))  CLOSTRIDIUM DIFFICILE BY PCR     Status: Normal   Collection Time   07/05/11  5:16 AM      Component Value Range Status Comment   C difficile by pcr NEGATIVE  NEGATIVE  Final   STOOL CULTURE     Status: Normal (Preliminary result)   Collection Time   07/05/11  9:51 AM      Component Value Range Status Comment   Specimen Description STOOL   Final    Special Requests NONE   Final    Culture Culture reincubated for better growth   Final    Report Status PENDING   Incomplete   CLOSTRIDIUM DIFFICILE BY PCR     Status: Normal   Collection Time   07/05/11 10:03 AM      Component Value Range Status Comment   C difficile by pcr NEGATIVE  NEGATIVE  Final   OVA  AND PARASITE EXAMINATION     Status: Normal   Collection Time   07/05/11 10:03 AM      Component Value Range Status Comment   Specimen Description STOOL   Final    Special Requests NONE   Final    Ova and parasites NO OVA OR PARASITES SEEN MODERATE WBC   Final    Report Status 07/06/2011 FINAL   Final      Hospital Course: This very pleasant 76 year old man was admitted with fever, aches and dyspnea. Please see initial history and physical examination. The initial diagnosis seemed to be clinically 1 of influenza but influenza testing was negative. He also had developed copious amounts of diarrhea which was foul smelling. CT scan of his abdomen showed right-sided colitis. C. difficile toxin was negative. Stool cultures have been negative. He was reviewed by Dr. Madilyn Fireman, gastroenterology who agreed with treatment. He has done well with intravenous antibiotics including metronidazole and ciprofloxacin. Yesterday his antibiotics was switched to ciprofloxacin orally only. He is continued to do well, tolerated the diet without any evidence of vomiting. His stools are beginning to be formed. There is no blood in the stools.  Discharge Exam: Blood  pressure 140/82, pulse 61, temperature 97.8 F (36.6 C), temperature source Oral, resp. rate 19, height 5\' 6"  (1.676 m), weight 84.505 kg (186 lb 4.8 oz), SpO2 99.00%. He looks systemically well. Is not toxic or septic. His abdomen is soft and mildly tender in the lower abdomen. There is no rebound tenderness. Bowel sounds are heard. Heart sounds are present and normal. Lung fields are clear. He is alert and orientated.  Disposition: Home. He will have a further 5 day course of ciprofloxacin orally. He'll followup with Dr. Madilyn Fireman in approximately 3 weeks' time.  Discharge Orders    Future Appointments: Provider: Department: Dept Phone: Center:   08/17/2011 8:50 AM Lorenda Cahill [facility] [phone] [facility]   08/17/2011 9:00 AM Elyn Aquas., MD [facility] [phone] None     Future Orders Please Complete By Expires   Diet - low sodium heart healthy      Increase activity slowly         Follow-up Information    Follow up with HAYES,JOHN C, MD in 3 weeks.   Contact information:   [address], [address] [employer], [place]. [place] [address] [phone]          Signed: Wilson Singer 07/09/2011, 8:31 AM

## 2011-08-03 LAB — STOOL CULTURE

## 2011-08-17 ENCOUNTER — Encounter: Payer: Self-pay | Admitting: Cardiovascular Disease

## 2011-08-17 ENCOUNTER — Other Ambulatory Visit (INDEPENDENT_AMBULATORY_CARE_PROVIDER_SITE_OTHER): Payer: Medicare Other | Admitting: *Deleted

## 2011-08-17 ENCOUNTER — Ambulatory Visit (INDEPENDENT_AMBULATORY_CARE_PROVIDER_SITE_OTHER): Payer: Medicare Other | Admitting: Cardiovascular Disease

## 2011-08-17 DIAGNOSIS — I2510 Atherosclerotic heart disease of native coronary artery without angina pectoris: Secondary | ICD-10-CM

## 2011-08-17 DIAGNOSIS — E785 Hyperlipidemia, unspecified: Secondary | ICD-10-CM

## 2011-08-17 LAB — BASIC METABOLIC PANEL
CO2: 29 mEq/L (ref 19–32)
Calcium: 8.9 mg/dL (ref 8.4–10.5)
Chloride: 101 mEq/L (ref 96–112)
Glucose, Bld: 157 mg/dL — ABNORMAL HIGH (ref 70–99)
Potassium: 3.9 mEq/L (ref 3.5–5.1)
Sodium: 137 mEq/L (ref 135–145)

## 2011-08-17 LAB — LIPID PANEL
HDL: 30.4 mg/dL — ABNORMAL LOW (ref >39.00)
LDL Cholesterol: 66 mg/dL (ref 0–99)
Total CHOL/HDL Ratio: 4
VLDL: 23.8 mg/dL (ref 0.0–40.0)

## 2011-08-17 LAB — HEPATIC FUNCTION PANEL
Albumin: 3.7 g/dL (ref 3.5–5.2)
Total Bilirubin: 1.5 mg/dL — ABNORMAL HIGH (ref 0.3–1.2)

## 2011-08-17 MED ORDER — ATORVASTATIN CALCIUM 40 MG PO TABS: 40.0000 mg | ORAL_TABLET | Freq: Every day | ORAL | Status: DC

## 2011-08-17 NOTE — Assessment & Plan Note (Signed)
Benjamin Harrison is doing very well. He continues with his same medications. He's not having any further episodes of chest discomfort.

## 2011-08-17 NOTE — Patient Instructions (Signed)
Your physician wants you to follow-up in: 6 MONTHS  You will receive a reminder letter in the mail two months in advance. If you don't receive a letter, please call our office to schedule the follow-up appointment.   Your physician recommends that you return for a FASTING lipid profile: 3 MONTHS/ 6  MONTHS  Your physician has recommended you make the following change in your medication:   STOP CRESTOR  START ATORVASTATIN 40 MG DAILY

## 2011-08-17 NOTE — Progress Notes (Signed)
Benjamin Harrison Date of Birth  1935-07-22 [facility]     [place] [facility]  [address]    [address]   [address] [place], [place]  [address]    [place], [place]  [address] [phone]  Fax  [phone]  [phone]  Fax [phone]   Problem list:  1. Coronary artery disease: Status post CABG in2001. He status post stenting of his mid right coronary artery in 10/17/2010 2. Hypertension 3. Hypothyroidism 4. Hyperlipidemia  History of Present Illness:  Benjamin Harrison is a 77 year old gentleman with a history of coronary artery disease. He status post PTCA and stenting of his right coronary. He had repeat stenting of his right coronary artery as recently as March, 2012  He also status post coronary artery bypass grafting. Also has a history of hypertension, hyperlipidemia, and hypothyroidism.  He got sick around Christmas for a GI viral illness.  He's feeling better.  He is not having any chest pain.  He's been out working in his garden has not had any episodes of chest pain.   Current Outpatient Prescriptions on File Prior to Visit  Medication Sig Dispense Refill  . aspirin 325 MG tablet Take 325 mg by mouth daily.        . clopidogrel (PLAVIX) 75 MG tablet TAKE 1 TABLET BY MOUTH EVERY DAY  30 tablet  5  . famotidine (PEPCID) 20 MG tablet Take 1 tablet (20 mg total) by mouth daily.  30 tablet    . glimepiride (AMARYL) 2 MG tablet Take 2 mg by mouth daily before breakfast.        . levothyroxine (SYNTHROID, LEVOTHROID) 88 MCG tablet Take 88 mcg by mouth daily.        . metoprolol (LOPRESSOR) 50 MG tablet Take 25 mg by mouth 2 (two) times daily.        . nitroGLYCERIN (NITROSTAT) 0.4 MG SL tablet Place 0.4 mg under the tongue every 5 (five) minutes as needed. For chest pain.      . rosuvastatin (CRESTOR) 20 MG tablet Take 1 tablet (20 mg total) by mouth at bedtime.  30 tablet    . valsartan (DIOVAN) 160 MG tablet Take 1 tablet (160 mg total) by mouth daily.  30  tablet  11    Allergies  Allergen Reactions  . Ace Inhibitors     Past Medical History  Diagnosis Date  . Coronary artery disease     STATUS POST INFERIOR WALL M.I.  . MI (myocardial infarction)     STATUS POST PTC AND STENTING OF HIS MID RCA  . Hypertension   . Hypothyroidism   . Hyperlipidemia   . Chest pain     HEAVINESS  . SOB (shortness of breath) on exertion   . Personal history of tobacco use     REMOTE IN THE PAST  . Diabetes mellitus   . Dyslipidemia   . Osteoarthritis     End-stage osteoarthritis, right knee, medial compartment    Past Surgical History  Procedure Date  . Coronary artery bypass graft   . Cardiac catheterization   . Knee arthroscopy 1999 and 2004  . Hernia repair 1985    History  Smoking status  . Former Smoker  . Quit date: 08/01/1975  Smokeless tobacco  . Not on file    History  Alcohol Use No    Family History  Problem Relation Age of Onset  . Hypertension Mother   . Cancer Sister  Throat Cancer  . Heart disease Brother   . Pneumonia Brother   . Heart disease Brother   . Cancer Brother     Prostate Cancer    Reviw of Systems:  Reviewed in the HPI.  All other systems are negative.  Physical Exam: BP 115/79  Pulse 63  Ht 5\' 6"  (1.676 m)  Wt 177 lb 1.9 oz (80.341 kg)  BMI 28.59 kg/m2 The patient is alert and oriented x 3.  The mood and affect are normal.   Skin: warm and dry.  Color is normal.    HEENT:   Normocephalic/atraumatic. His mucous membranes are moist. His neck is supple. Is no JVD. His carotids are normal.  Lungs: Lungs are clear. His back is nontender.   Heart: Regular rate S1-S2. He has no murmurs.    Abdomen: Has good bowel sounds. He is mildly obese. There is no hepatosplenomegaly.  Extremities:  No clubbing cyanosis or edema the  Neuro:  Neuro exam is nonfocal. His gait is normal.    ECG:  Assessment / Plan:

## 2011-08-17 NOTE — Assessment & Plan Note (Signed)
He's not having any further episodes of chest pain.

## 2011-08-17 NOTE — Assessment & Plan Note (Signed)
He would like to take the generic statin. We will discontinue the Crestor and start him on Lipitor 40 mg a day.  Check fasting labs in 3 months and again when I see him for an office visit in 6 months.

## 2011-09-01 ENCOUNTER — Ambulatory Visit
Admission: RE | Admit: 2011-09-01 | Discharge: 2011-09-01 | Disposition: A | Discharge status: Home / Self Care | Payer: Medicare Other | Source: Ambulatory Visit | Attending: Gastroenterology | Admitting: Gastroenterology

## 2011-09-01 ENCOUNTER — Other Ambulatory Visit: Payer: Self-pay | Admitting: Gastroenterology

## 2011-09-01 DIAGNOSIS — K529 Noninfective gastroenteritis and colitis, unspecified: Secondary | ICD-10-CM

## 2011-10-08 ENCOUNTER — Encounter (HOSPITAL_COMMUNITY): Payer: Self-pay | Admitting: *Deleted

## 2011-10-08 ENCOUNTER — Emergency Department (HOSPITAL_COMMUNITY): Payer: Medicare Other

## 2011-10-08 ENCOUNTER — Inpatient Hospital Stay (HOSPITAL_COMMUNITY)
Admission: EM | Admit: 2011-10-08 | Discharge: 2011-10-10 | DRG: 251 | Disposition: A | Discharge status: Home / Self Care | Payer: Medicare Other | Attending: Internal Medicine | Admitting: Internal Medicine

## 2011-10-08 ENCOUNTER — Other Ambulatory Visit: Payer: Self-pay

## 2011-10-08 DIAGNOSIS — Y849 Medical procedure, unspecified as the cause of abnormal reaction of the patient, or of later complication, without mention of misadventure at the time of the procedure: Secondary | ICD-10-CM | POA: Diagnosis present

## 2011-10-08 DIAGNOSIS — Z87891 Personal history of nicotine dependence: Secondary | ICD-10-CM

## 2011-10-08 DIAGNOSIS — E039 Hypothyroidism, unspecified: Secondary | ICD-10-CM | POA: Diagnosis present

## 2011-10-08 DIAGNOSIS — I214 Non-ST elevation (NSTEMI) myocardial infarction: Principal | ICD-10-CM

## 2011-10-08 DIAGNOSIS — I10 Essential (primary) hypertension: Secondary | ICD-10-CM

## 2011-10-08 DIAGNOSIS — I252 Old myocardial infarction: Secondary | ICD-10-CM

## 2011-10-08 DIAGNOSIS — I25810 Atherosclerosis of coronary artery bypass graft(s) without angina pectoris: Secondary | ICD-10-CM | POA: Diagnosis present

## 2011-10-08 DIAGNOSIS — E119 Type 2 diabetes mellitus without complications: Secondary | ICD-10-CM | POA: Diagnosis present

## 2011-10-08 DIAGNOSIS — I200 Unstable angina: Secondary | ICD-10-CM | POA: Insufficient documentation

## 2011-10-08 DIAGNOSIS — T82897A Other specified complication of cardiac prosthetic devices, implants and grafts, initial encounter: Secondary | ICD-10-CM | POA: Diagnosis present

## 2011-10-08 DIAGNOSIS — I2510 Atherosclerotic heart disease of native coronary artery without angina pectoris: Secondary | ICD-10-CM

## 2011-10-08 DIAGNOSIS — M1710 Unilateral primary osteoarthritis, unspecified knee: Secondary | ICD-10-CM | POA: Diagnosis present

## 2011-10-08 DIAGNOSIS — E785 Hyperlipidemia, unspecified: Secondary | ICD-10-CM

## 2011-10-08 LAB — BASIC METABOLIC PANEL
CO2: 25 mEq/L (ref 19–32)
Calcium: 8.7 mg/dL (ref 8.4–10.5)
Chloride: 104 mEq/L (ref 96–112)
Creatinine, Ser: 1.01 mg/dL (ref 0.50–1.35)
Glucose, Bld: 171 mg/dL — ABNORMAL HIGH (ref 70–99)
Sodium: 137 mEq/L (ref 135–145)

## 2011-10-08 LAB — MRSA PCR SCREENING: MRSA by PCR: NEGATIVE

## 2011-10-08 LAB — CBC
HCT: 38.3 % — ABNORMAL LOW (ref 39.0–52.0)
MCV: 73.4 fL — ABNORMAL LOW (ref 78.0–100.0)
RBC: 5.22 MIL/uL (ref 4.22–5.81)
WBC: 6.4 10*3/uL (ref 4.0–10.5)

## 2011-10-08 LAB — CARDIAC PANEL(CRET KIN+CKTOT+MB+TROPI)
CK, MB: 1.3 ng/mL (ref 0.3–4.0)
CK, MB: 3.4 ng/mL (ref 0.3–4.0)
Total CK: 44 U/L (ref 7–232)
Total CK: 53 U/L (ref 7–232)
Troponin I: <0.3 ng/mL (ref <0.30)
Troponin I: 0.61 ng/mL (ref <0.30)

## 2011-10-08 LAB — HEMOGLOBIN A1C
Hgb A1c MFr Bld: 6.8 % — ABNORMAL HIGH (ref <5.7)
Mean Plasma Glucose: 148 mg/dL — ABNORMAL HIGH (ref <117)

## 2011-10-08 LAB — DIFFERENTIAL
Eosinophils Relative: 2 % (ref 0–5)
Lymphocytes Relative: 17 % (ref 12–46)
Lymphs Abs: 1.1 10*3/uL (ref 0.7–4.0)
Monocytes Absolute: 0.8 10*3/uL (ref 0.1–1.0)
Neutro Abs: 4.4 10*3/uL (ref 1.7–7.7)

## 2011-10-08 LAB — POCT I-STAT TROPONIN I: Troponin i, poc: 0 ng/mL (ref 0.00–0.08)

## 2011-10-08 LAB — PLATELET INHIBITION P2Y12: Platelet Function  P2Y12: 190 [PRU] — ABNORMAL LOW (ref 194–418)

## 2011-10-08 LAB — PROTIME-INR: INR: 1.09 (ref 0.00–1.49)

## 2011-10-08 LAB — GLUCOSE, CAPILLARY

## 2011-10-08 MED ORDER — MORPHINE SULFATE 2 MG/ML IJ SOLN
2.0000 mg | Freq: Once | INTRAMUSCULAR | Status: AC
  Administered 2011-10-08: 2 mg via INTRAVENOUS
  Filled 2011-10-08: qty 1

## 2011-10-08 MED ORDER — IRBESARTAN 150 MG PO TABS
150.0000 mg | ORAL_TABLET | Freq: Once | ORAL | Status: AC
  Administered 2011-10-08: 150 mg via ORAL
  Filled 2011-10-08: qty 1

## 2011-10-08 MED ORDER — ASPIRIN EC 325 MG PO TBEC: 325.0000 mg | DELAYED_RELEASE_TABLET | Freq: Every day | ORAL | Status: DC

## 2011-10-08 MED ORDER — ATORVASTATIN CALCIUM 80 MG PO TABS
80.0000 mg | ORAL_TABLET | Freq: Every day | ORAL | Status: DC
  Administered 2011-10-08: 80 mg via ORAL
  Filled 2011-10-08 (×3): qty 1

## 2011-10-08 MED ORDER — FAMOTIDINE 20 MG PO TABS
20.0000 mg | ORAL_TABLET | Freq: Every day | ORAL | Status: DC
  Administered 2011-10-09 – 2011-10-10 (×2): 20 mg via ORAL
  Filled 2011-10-08 (×3): qty 1

## 2011-10-08 MED ORDER — IRBESARTAN 150 MG PO TABS
150.0000 mg | ORAL_TABLET | Freq: Every day | ORAL | Status: DC
  Administered 2011-10-09 – 2011-10-10 (×2): 150 mg via ORAL
  Filled 2011-10-08 (×2): qty 1

## 2011-10-08 MED ORDER — GLIMEPIRIDE 2 MG PO TABS
2.0000 mg | ORAL_TABLET | Freq: Every day | ORAL | Status: DC
  Administered 2011-10-09 – 2011-10-10 (×2): 2 mg via ORAL
  Filled 2011-10-08 (×4): qty 1

## 2011-10-08 MED ORDER — NITROGLYCERIN 0.4 MG SL SUBL: 0.4000 mg | SUBLINGUAL_TABLET | SUBLINGUAL | Status: DC | PRN

## 2011-10-08 MED ORDER — MORPHINE SULFATE 2 MG/ML IJ SOLN: 2.0000 mg | Freq: Once | INTRAMUSCULAR | Status: DC

## 2011-10-08 MED ORDER — LEVOTHYROXINE SODIUM 88 MCG PO TABS
88.0000 ug | ORAL_TABLET | Freq: Every day | ORAL | Status: DC
  Administered 2011-10-09 – 2011-10-10 (×2): 88 ug via ORAL
  Filled 2011-10-08 (×3): qty 1

## 2011-10-08 MED ORDER — SODIUM CHLORIDE 0.9 % IJ SOLN: 3.0000 mL | INTRAMUSCULAR | Status: DC | PRN

## 2011-10-08 MED ORDER — LEVOTHYROXINE SODIUM 88 MCG PO TABS
88.0000 ug | ORAL_TABLET | Freq: Once | ORAL | Status: DC
  Filled 2011-10-08: qty 1

## 2011-10-08 MED ORDER — SODIUM CHLORIDE 0.9 % IJ SOLN
3.0000 mL | Freq: Two times a day (BID) | INTRAMUSCULAR | Status: DC
  Administered 2011-10-08 – 2011-10-09 (×2): 3 mL via INTRAVENOUS

## 2011-10-08 MED ORDER — METOPROLOL TARTRATE 25 MG PO TABS
25.0000 mg | ORAL_TABLET | Freq: Two times a day (BID) | ORAL | Status: DC
  Administered 2011-10-08 – 2011-10-10 (×4): 25 mg via ORAL
  Filled 2011-10-08 (×7): qty 1

## 2011-10-08 MED ORDER — ONDANSETRON HCL 4 MG/2ML IJ SOLN: 4.0000 mg | Freq: Four times a day (QID) | INTRAMUSCULAR | Status: DC | PRN

## 2011-10-08 MED ORDER — ASPIRIN 300 MG RE SUPP
300.0000 mg | RECTAL | Status: DC
  Filled 2011-10-08: qty 1

## 2011-10-08 MED ORDER — ASPIRIN 81 MG PO CHEW
324.0000 mg | CHEWABLE_TABLET | ORAL | Status: DC
  Filled 2011-10-08: qty 4

## 2011-10-08 MED ORDER — NITROGLYCERIN IN D5W 200-5 MCG/ML-% IV SOLN
2.0000 ug/min | Freq: Once | INTRAVENOUS | Status: AC
  Administered 2011-10-08: 5 ug/min via INTRAVENOUS
  Filled 2011-10-08: qty 250

## 2011-10-08 MED ORDER — ALPRAZOLAM 0.25 MG PO TABS: 0.2500 mg | ORAL_TABLET | Freq: Two times a day (BID) | ORAL | Status: DC | PRN

## 2011-10-08 MED ORDER — NITROGLYCERIN IN D5W 200-5 MCG/ML-% IV SOLN: 3.0000 ug/min | INTRAVENOUS | Status: DC

## 2011-10-08 MED ORDER — ONDANSETRON HCL 4 MG/2ML IJ SOLN: 4.0000 mg | Freq: Once | INTRAMUSCULAR | Status: DC

## 2011-10-08 MED ORDER — NITROGLYCERIN IN D5W 200-5 MCG/ML-% IV SOLN: 2.0000 ug/min | Freq: Once | INTRAVENOUS | Status: DC

## 2011-10-08 MED ORDER — ASPIRIN EC 81 MG PO TBEC: 81.0000 mg | DELAYED_RELEASE_TABLET | Freq: Every day | ORAL | Status: DC

## 2011-10-08 MED ORDER — ACETAMINOPHEN 325 MG PO TABS: 650.0000 mg | ORAL_TABLET | ORAL | Status: DC | PRN

## 2011-10-08 MED ORDER — HEPARIN (PORCINE) IN NACL 100-0.45 UNIT/ML-% IJ SOLN
1250.0000 [IU]/h | INTRAMUSCULAR | Status: DC
  Administered 2011-10-08: 1000 [IU]/h via INTRAVENOUS
  Filled 2011-10-08 (×2): qty 250

## 2011-10-08 MED ORDER — NIACIN 250 MG PO TABS
250.0000 mg | ORAL_TABLET | Freq: Every day | ORAL | Status: DC
  Filled 2011-10-08 (×2): qty 1

## 2011-10-08 MED ORDER — ONDANSETRON HCL 4 MG/2ML IJ SOLN
4.0000 mg | Freq: Once | INTRAMUSCULAR | Status: AC
  Administered 2011-10-08: 4 mg via INTRAVENOUS
  Filled 2011-10-08: qty 2

## 2011-10-08 MED ORDER — CLOPIDOGREL BISULFATE 75 MG PO TABS
75.0000 mg | ORAL_TABLET | Freq: Every day | ORAL | Status: DC
  Administered 2011-10-09 – 2011-10-10 (×2): 75 mg via ORAL
  Filled 2011-10-08 (×3): qty 1

## 2011-10-08 MED ORDER — SODIUM CHLORIDE 0.9 % IV SOLN: 250.0000 mL | INTRAVENOUS | Status: DC | PRN

## 2011-10-08 MED ORDER — ASPIRIN 81 MG PO CHEW
324.0000 mg | CHEWABLE_TABLET | ORAL | Status: AC
  Administered 2011-10-09: 324 mg via ORAL

## 2011-10-08 MED ORDER — ASPIRIN 325 MG PO TABS: 325.0000 mg | ORAL_TABLET | Freq: Every day | ORAL | Status: DC

## 2011-10-08 NOTE — ED Notes (Signed)
Cardiology at bedside.

## 2011-10-08 NOTE — H&P (Signed)
Physician Discharge Summary  Patient ID: Benjamin Harrison MRN: [id number] DOB/AGE: 09/27/1934 77 y.o. Admit date: 10/08/2011  Primary Care Physician:Benjamin Benjamin Millin, MD, MD Primary Cardiologist: Melburn Popper  Active Problems:  Coronary artery disease  Unstable angina  Hypertension  Diabetes mellitus  HPI: [name] is a 77 year old Caucasian male with known history of coronary artery disease. He had a cardiac catheterization in March of 2012 which revealed an 80% mid right coronary artery stenosis, between 2 existing stents in the right coronary artery. He had a subsequent PTCA and drug-eluting stent placed in between the 2 existing stents that was non-overlapping. He also has a history of a prior myocardial infarction, hypertension, hypothyroidism,hyperlipidemia, and diabetes.  This a.m. the patient awoke around 7:00 with substernal chest pain pressure around his heart radiation into his back and left arm. He had lightheadedness and dizziness and diaphoresis. The patient states that he had mild trouble breathing, but not severe. The patient took nitroglycerin sublingual with some diminishing discomfort, but not complete resolution. He was brought to the emergency room via EMS. Blood pressure was 125/77, pulse 72, O2 sat 96%. The patient was given nitroglycerin sublingual and started on a nitroglycerin drip. His been titrated up from 5 mcg a minuteup to 15 mcg a minute with relief of chest discomfort. He was also started on a heparin drip and given Zofran. The patient states the pain is exactly like the pain. He had prior to his myocardial infarction. He states that the pain was severe 8-10 over 10. Cardiac enzymes initially are negative. EKG revealed normal sinus rhythm with anterior abnormalities though non specific. Pain now much improved 2/10. F/u cardiac markers are pending.   Review of systems complete and found to be negative unless listed above  Past Medical History  Diagnosis Date  .  Coronary artery disease     STATUS POST INFERIOR WALL M.I.  . MI (myocardial infarction)     STATUS POST PTC AND STENTING OF HIS MID RCA  . Hypertension   . Hypothyroidism   . Hyperlipidemia   . Chest pain     HEAVINESS  . SOB (shortness of breath) on exertion   . Personal history of tobacco use     REMOTE IN THE PAST  . Diabetes mellitus   . Dyslipidemia   . Osteoarthritis     End-stage osteoarthritis, right knee, medial compartment    Family History  Problem Relation Age of Onset  . Hypertension Mother   . Cancer Sister     Throat Cancer  . Heart disease Brother   . Pneumonia Brother   . Heart disease Brother   . Cancer Brother     Prostate Cancer    History   Social History  . Marital Status: Married    Spouse Name: N/A    Number of Children: N/A  . Years of Education: N/A   Occupational History  . Not on file.   Social History Main Topics  . Smoking status: Former Smoker    Quit date: 08/01/1975  . Smokeless tobacco: Not on file  . Alcohol Use: No  . Drug Use: No  . Sexually Active:    Other Topics Concern  . Not on file   Social History Narrative  . No narrative on file    Past Surgical History  Procedure Date  . Coronary artery bypass graft   . Cardiac catheterization   . Knee arthroscopy 1999 and 2004  . Hernia repair 1985  Physical Exam: Blood pressure 123/77, pulse 64, temperature 98.4 F (36.9 C), temperature source Oral, resp. rate 13, SpO2 99.00%.   General: Well developed, well nourished, in no acute distress Head: Eyes PERRLA, No xanthomas.   Normal cephalic and atramatic  Lungs: Clear bilaterally to auscultation and percussion. Heart: HRRR S1 S2, without MRG. Distant Heart sounds. Pulses are 2+ & equal.            No carotid bruit. No JVD.  No abdominal bruits. No femoral bruits. Abdomen: Bowel sounds are positive, abdomen soft and non-tender without masses or  Hernia's noted. Msk:  Back normal, normal gait. Normal strength  and tone for age. Extremities: No clubbing, cyanosis or edema.  DP +1 Neuro: Alert and oriented X 3. Psych:  Good affect, responds appropriately  Cardiac Cath 10/10/2010 ANGIOGRAPHY:  Left main is small to moderate size.  It is patent.    The left anterior descending artery is occluded proximally.    Left circumflex artery.  Left circumflex artery has minor luminal   irregularities.  It supplies a large obtuse marginal artery distribution   which is normal.  The right coronary artery has a stent in the mid segment and a stent in   the distal segment.  There is a 50-60% stenosis just proximal to the mid   stent.  Between the mid and distal stents there is a long 80% stenosis.   This vessel fairly small and perhaps is 2 to 2.25 mm in diameter.  The   posterior descending artery is fairly small but is free of critical   disease.  The left ventriculogram reveals normal left ventricular systolic   function.  Ejection fraction is about 65%.     Intervention 10/10/2010 Successful percutaneous transluminal coronary angioplasty   with placement of a drug-eluting stent in the distal right coronary   artery and a drug-eluting stent in the mid right coronary artery.  Of   note, the stents did not overlap.    Labs:   Lab Results  Component Value Date   WBC 6.4 10/08/2011   HGB 13.3 10/08/2011   HCT 38.3* 10/08/2011   MCV 73.4* 10/08/2011   PLT 175 10/08/2011    Lab 10/08/11 1008  NA 137  K 3.6  CL 104  CO2 25  BUN 20  CREATININE 1.01  CALCIUM 8.7  PROT --  BILITOT --  ALKPHOS --  ALT --  AST --  GLUCOSE 171*   Lab Results  Component Value Date   CKTOTAL 44 10/08/2011   CKMB 1.3 10/08/2011   TROPONINI <0.30 10/08/2011    Lab Results  Component Value Date   CHOL 120 08/17/2011   Lab Results  Component Value Date   HDL 30.40* 08/17/2011   Lab Results  Component Value Date   LDLCALC 66 08/17/2011   Lab Results  Component Value Date   TRIG 119.0 08/17/2011   Lab Results    Component Value Date   CHOLHDL 4 08/17/2011   No results found for this basename: LDLDIRECT      Radiology: Dg Chest 2 View  10/08/2011  *RADIOLOGY REPORT*  Clinical Data: Chest pain and left arm pain.  History of CABG and prior coronary stent placement.  CHEST - 2 VIEW  Comparison: 07/04/2011  Findings: Stable heart size status post prior CABG.  There is no evidence of pulmonary edema or pleural fluid.  Chronic elevation of the right hemidiaphragm is stable.  The bony thorax is unremarkable.  IMPRESSION:  No acute findings.  Original Report Authenticated By: Reola Calkins, M.D.   EKG:NSR with nonspecific anterior abnormalities  ASSESSMENT AND PLAN:  1. Unstable angina:  He was awakened around 7 AM with severe chest pressure and pain radiating to his back and left arm with associated diaphoresis and lightheadedness. He states the pain was exactly like the pain. He had prior to his MI and prior to stent placements one year ago. Nitroglycerin taken at home did help some but not completely relieved. He is currently on a heparin drip and a nitroglycerin drip and is comfortable. Initial cardiac enzymes are negative. We will continue to cycle cardiac enzymes EKG shows no acute ischemia. We will admit the patient to sit down, so that his nitroglycerin can be titrated if necessary. Plan cardiac catheterization this week. Dr. Melburn Popper will see him in the a.m.  2. CAD: Most recent cardiac catheterization was in March of 2012. At that time. He had a drug-eluting stent placed to the mid coronary artery in between the 2 existing drug-eluting stents. The stents were not overlapping. The LAD was occluded proximally.There was a 50-60% stenosis just proximal to the mid stent. This may have progressed causing his current chest pain. Continue heparin and nitroglycerin. Will resume home medications to include beta blocker and ARB  3. Diabetes: We will check hemoglobin A1c and keep patient on current medications.Check  glucose AC and HS.  Signed: Joni Reining 10/08/2011, 12:49 PM Co-Sign MD  Patient seen and examined independently. Latanya Maudlin, NP note reviewed carefully - agree with her assessment and plan. I have edited the note based on my findings. Symptoms very concerning for [place].  Will admit for cath. Treat with ASA, NTG, b-blocker, heparin, statin, Plavix and MSO4. Will cycle cardiac markers. Plan cath tomorrow. However if CP persists and CE are trending up may need cath sooner. Will check P2Y12 if subtherapeutic can consider switch to another thienopyridine.   Rigo Letts,MD 1:32 PM

## 2011-10-08 NOTE — ED Notes (Signed)
Chest pain (stiff); took x 4 ntg.sl.which helped for a while, but would come back. Took x 324 mg of uncoated asa. This am.

## 2011-10-08 NOTE — ED Notes (Addendum)
Patient states he started having chest pain around 7am this morning and once patient got out of bed the pain worsened. Patient states he took 4 nitroglycerin tablets and aspirin this morning at home with no relief. Patient denies N/V/F. Patient states he felt dizzy and diaphoretic. Patient presents with diaphoretic and denies chest pain. Patient states he has heaviness in his chest. Patient placed on monitor and 2L oxygen with sats of 100%. EDP at bedside.

## 2011-10-08 NOTE — Progress Notes (Signed)
Notified Shanda Bumps hope MD aboout pt.pos. Trop.

## 2011-10-08 NOTE — ED Notes (Signed)
Report called to receiving RN on 2900.  Bed is ready.

## 2011-10-08 NOTE — ED Notes (Signed)
Patient remains on monitor and 2L oxygen with sats of 99%. Patient states he still has pressure in his chest and still feels dizzy. Family at bedside.

## 2011-10-08 NOTE — ED Provider Notes (Signed)
History     CSN: [id number]  Arrival date & time 10/08/11  4098   First MD Initiated Contact with Patient 10/08/11 [phone]      Chief Complaint  Patient presents with  . Chest Pain    (Consider location/radiation/quality/duration/timing/severity/associated sxs/prior treatment) HPI Comments: Patient reports that he began having chest pain that woke him up from sleep approximately 3 hours ago.  The pain radiated to his left arm and was associated with some numbness of the left arm.  He also reports that while he was having the chest pain he was feeling short of breath, "swimmy headed" and diaphoretic.  He took 4 SL Nitroglycerin and a 324 mg ASA which he states helped the pain.  He is currently not having any chest pain at this time.  PMH significant for CABG in 2001 and stenting of the RCA back in March 2012.  His Cardiologist is Dr. Elease Hashimoto with [facility].    Patient is a 77 y.o. male presenting with chest pain. The history is provided by the patient.  Chest Pain The quality of the pain is described as heavy. Primary symptoms include shortness of breath and dizziness. Pertinent negatives for primary symptoms include no fever, no syncope, no cough, no wheezing, no palpitations, no abdominal pain, no nausea and no vomiting.  Dizziness does not occur with nausea or vomiting.      Past Medical History  Diagnosis Date  . Coronary artery disease     STATUS POST INFERIOR WALL M.I.  . MI (myocardial infarction)     STATUS POST PTC AND STENTING OF HIS MID RCA  . Hypertension   . Hypothyroidism   . Hyperlipidemia   . Chest pain     HEAVINESS  . SOB (shortness of breath) on exertion   . Personal history of tobacco use     REMOTE IN THE PAST  . Diabetes mellitus   . Dyslipidemia   . Osteoarthritis     End-stage osteoarthritis, right knee, medial compartment    Past Surgical History  Procedure Date  . Coronary artery bypass graft   . Cardiac catheterization   . Knee arthroscopy 1999 and  2004  . Hernia repair 1985    Family History  Problem Relation Age of Onset  . Hypertension Mother   . Cancer Sister     Throat Cancer  . Heart disease Brother   . Pneumonia Brother   . Heart disease Brother   . Cancer Brother     Prostate Cancer    History  Substance Use Topics  . Smoking status: Former Smoker    Quit date: 08/01/1975  . Smokeless tobacco: Not on file  . Alcohol Use: No      Review of Systems  Constitutional: Negative for fever and chills.  HENT: Negative for neck pain and neck stiffness.   Respiratory: Positive for shortness of breath. Negative for cough and wheezing.   Cardiovascular: Positive for chest pain. Negative for palpitations, leg swelling and syncope.  Gastrointestinal: Negative for nausea, vomiting and abdominal pain.  Skin: Negative for rash.  Neurological: Positive for dizziness.  Psychiatric/Behavioral: Negative for confusion.    Allergies  Ace inhibitors  Home Medications   Current Outpatient Rx  Name Route Sig Dispense Refill  . ASPIRIN 325 MG PO TABS Oral Take 325 mg by mouth daily.      Marland Kitchen CLOPIDOGREL BISULFATE 75 MG PO TABS Oral Take 75 mg by mouth daily.    Marland Kitchen FAMOTIDINE 20 MG PO TABS  Oral Take 1 tablet (20 mg total) by mouth daily. 30 tablet   . GLIMEPIRIDE 2 MG PO TABS Oral Take 2 mg by mouth daily before breakfast.      . LEVOTHYROXINE SODIUM 88 MCG PO TABS Oral Take 88 mcg by mouth daily.      Marland Kitchen METOPROLOL TARTRATE 50 MG PO TABS Oral Take 25 mg by mouth 2 (two) times daily.      Marland Kitchen NITROGLYCERIN 0.4 MG SL SUBL Sublingual Place 0.4 mg under the tongue every 5 (five) minutes as needed. For chest pain.    Marland Kitchen ROSUVASTATIN CALCIUM 20 MG PO TABS Oral Take 20 mg by mouth at bedtime.    Marland Kitchen VALSARTAN 160 MG PO TABS Oral Take 160 mg by mouth daily.      BP 125/77  Pulse 72  Temp(Src) 98.4 F (36.9 C) (Oral)  Resp 18  SpO2 96%  Physical Exam  Nursing note and vitals reviewed. Constitutional: He is oriented to person, place,  and time. He appears well-developed and well-nourished.  HENT:  Head: Normocephalic and atraumatic.  Neck: Normal range of motion. Neck supple.  Cardiovascular: Normal rate, regular rhythm and normal heart sounds.   Pulmonary/Chest: Effort normal and breath sounds normal. No respiratory distress. He has no wheezes. He has no rales. He exhibits no tenderness.  Abdominal: Soft. There is no tenderness.  Neurological: He is alert and oriented to person, place, and time.  Skin: He is diaphoretic.  Psychiatric: He has a normal mood and affect.    ED Course  Procedures (including critical care time)   Labs Reviewed  CBC  DIFFERENTIAL  BASIC METABOLIC PANEL   No results found.   No diagnosis found.   Date: 10/08/2011  Rate: 72  Rhythm: normal sinus rhythm  QRS Axis: normal  Intervals: normal  ST/T Wave abnormalities: normal  Conduction Disutrbances:none  Narrative Interpretation:   Old EKG Reviewed: unchanged  Patient discussed with Dr. Denton Lank who also evaluated patient in the ED.  11:39 AM Dr. Denton Lank discussed with [facility].  They recommend starting the patient on Heparin and report that they will come see patient in the ED and admit  MDM  Concern for cardiac etiology of Chest Pain. Cardiology has been consulted and will see patient in the ED for likely admit. Pt does not meet criteria for CP protocol and a further evaluation is recommended. Pt has been re-evaluated prior to consult and VSS, NAD, heart RRR, pain 0/10, lungs CTAB. No acute abnormalities found on EKG and first round of cardiac enzymes negative. This case was discussed with Dr. Denton Lank who has seen the patient and agrees with plan to admit.         Pascal Lux [place], PA-C 10/08/11 1547

## 2011-10-08 NOTE — Progress Notes (Signed)
ANTICOAGULATION CONSULT NOTE - Initial Consult  Pharmacy Consult for Heparin Indication: chest pain/ACS  Allergies  Allergen Reactions  . Ace Inhibitors Other (See Comments)    Unknown.    Patient Measurements: Height: 5\' 6"  (167.6 cm) Weight: 175 lb (79.379 kg) IBW/kg (Calculated) : 63.8   Vital Signs: Temp: 98.4 F (36.9 C) (03/10 0949) Temp src: Oral (03/10 0949) BP: 114/72 mmHg (03/10 1514) Pulse Rate: 68  (03/10 1514)  Labs:  Basename 10/08/11 1308 10/08/11 1008 10/08/11 1000  HGB -- 13.3 --  HCT -- 38.3* --  PLT -- 175 --  APTT -- -- --  LABPROT 14.3 -- --  INR 1.09 -- --  HEPARINUNFRC -- -- --  CREATININE -- 1.01 --  CKTOTAL 47 -- 44  CKMB 1.5 -- 1.3  TROPONINI <0.30 -- <0.30   Estimated Creatinine Clearance: 63.5 ml/min (by C-G formula based on Cr of 1.01).  Medical History: Past Medical History  Diagnosis Date  . Coronary artery disease     STATUS POST INFERIOR WALL M.I.  . MI (myocardial infarction)     STATUS POST PTC AND STENTING OF HIS MID RCA  . Hypertension   . Hypothyroidism   . Hyperlipidemia   . Chest pain     HEAVINESS  . SOB (shortness of breath) on exertion   . Personal history of tobacco use     REMOTE IN THE PAST  . Diabetes mellitus   . Dyslipidemia   . Osteoarthritis     End-stage osteoarthritis, right knee, medial compartment    Medications:  See med rec  Assessment: 77 y.o. male presents with chest pain. Heparin started in ED ~1230 at 1000 units/hr. No bolus given when started. 1000 units/hr is ~12 units/kg/hr and is appropriate for patient weight and indication. Will check 6 hr level and adjust as needed.  Goal of Therapy:  Heparin level 0.3-0.7 units/ml   Plan:  1. Continue heparin at 1000 units/hr 2. F/u 6 hr level - may need bolus as didn't receive bolus when initially started 3. Daily CBC and heparin level  Christoper Fabian, PharmD, BCPS Clinical pharmacist, pager [phone] 10/08/2011,3:21 PM

## 2011-10-08 NOTE — Progress Notes (Signed)
ANTICOAGULATION CONSULT NOTE - Initial Consult  Pharmacy Consult for Heparin Indication: chest pain/ACS  Patient Measurements: Height: 5\' 6"  (167.6 cm) Weight: 175 lb (79.379 kg) IBW/kg (Calculated) : 63.8  Heparin Dosing Weight: 79.4 kg  Labs:  Basename 10/08/11 1759 10/08/11 1308 10/08/11 1008 10/08/11 1000  HGB -- -- 13.3 --  HCT -- -- 38.3* --  PLT -- -- 175 --  APTT 104* -- -- --  LABPROT -- 14.3 -- --  INR -- 1.09 -- --  HEPARINUNFRC 0.37 -- -- --  CREATININE -- -- 1.01 --  CKTOTAL -- 47 -- 44  CKMB -- 1.5 -- 1.3  TROPONINI -- <0.30 -- <0.30   Estimated Creatinine Clearance: 63.5 ml/min (by C-G formula based on Cr of 1.01).  Assessment: 77 y.o. M with PMH significant for CAD requiring PCI (most recently in March 2012) on heparin for ACS sx while awaiting cardiac cath on 3/11 with a therapeutic heparin level this evening (HL 0.37, goal of 0.3-0.7). The patient was initiated on heparin in the ED around 1200 today.   Goal of Therapy:  Heparin level 0.3-0.7 units/ml   Plan:  1. Continue heparin at current drip rate of 1000 units/hr (10 ml/hr) 2. Will continue to monitor for any signs/symptoms of bleeding and will follow up with heparin level in the a.m to confirm  Georgina Pillion, PharmD, BCPS Clinical Pharmacist Pager: [phone] 10/08/2011 7:02 PM

## 2011-10-08 NOTE — ED Notes (Signed)
Patient returned from xray.

## 2011-10-08 NOTE — ED Notes (Signed)
Patient remains on monitor and 2L oxygen with sats of 100%. Patient resting with NAD at this time. Patient denies any chest pain at this time.

## 2011-10-08 NOTE — ED Notes (Signed)
Patient transported to x-ray. ?

## 2011-10-09 ENCOUNTER — Other Ambulatory Visit: Payer: Self-pay

## 2011-10-09 ENCOUNTER — Encounter (HOSPITAL_COMMUNITY)
Admission: EM | Disposition: A | Discharge status: Home / Self Care | Payer: Self-pay | Source: Home / Self Care | Attending: Internal Medicine

## 2011-10-09 DIAGNOSIS — I2510 Atherosclerotic heart disease of native coronary artery without angina pectoris: Secondary | ICD-10-CM

## 2011-10-09 DIAGNOSIS — I214 Non-ST elevation (NSTEMI) myocardial infarction: Secondary | ICD-10-CM

## 2011-10-09 HISTORY — PX: LEFT HEART CATHETERIZATION WITH CORONARY ANGIOGRAM: SHX5451

## 2011-10-09 LAB — LIPID PANEL
Cholesterol: 78 mg/dL (ref 0–200)
Triglycerides: 79 mg/dL (ref <150)

## 2011-10-09 LAB — CBC
Hemoglobin: 12.3 g/dL — ABNORMAL LOW (ref 13.0–17.0)
MCHC: 34.6 g/dL (ref 30.0–36.0)
RDW: 15.7 % — ABNORMAL HIGH (ref 11.5–15.5)

## 2011-10-09 LAB — CARDIAC PANEL(CRET KIN+CKTOT+MB+TROPI)
CK, MB: 4.5 ng/mL — ABNORMAL HIGH (ref 0.3–4.0)
Total CK: 63 U/L (ref 7–232)

## 2011-10-09 LAB — BASIC METABOLIC PANEL
GFR calc Af Amer: >90 mL/min (ref >90)
GFR calc non Af Amer: 79 mL/min — ABNORMAL LOW (ref >90)
Glucose, Bld: 155 mg/dL — ABNORMAL HIGH (ref 70–99)
Potassium: 3.8 mEq/L (ref 3.5–5.1)
Sodium: 138 mEq/L (ref 135–145)

## 2011-10-09 LAB — GLUCOSE, CAPILLARY
Glucose-Capillary: 70 mg/dL (ref 70–99)
Glucose-Capillary: 89 mg/dL (ref 70–99)

## 2011-10-09 LAB — MRSA PCR SCREENING: MRSA by PCR: NEGATIVE

## 2011-10-09 SURGERY — LEFT HEART CATHETERIZATION WITH CORONARY ANGIOGRAM
Anesthesia: LOCAL

## 2011-10-09 MED ORDER — ASPIRIN 81 MG PO CHEW
81.0000 mg | CHEWABLE_TABLET | Freq: Every day | ORAL | Status: DC
  Administered 2011-10-09 – 2011-10-10 (×2): 81 mg via ORAL
  Filled 2011-10-09 (×2): qty 1

## 2011-10-09 MED ORDER — NIACIN ER 250 MG PO CPCR
250.0000 mg | ORAL_CAPSULE | Freq: Every day | ORAL | Status: DC
  Administered 2011-10-09: 250 mg via ORAL
  Filled 2011-10-09 (×2): qty 1

## 2011-10-09 MED ORDER — HEPARIN (PORCINE) IN NACL 2-0.9 UNIT/ML-% IJ SOLN
INTRAMUSCULAR | Status: AC
  Filled 2011-10-09: qty 2000

## 2011-10-09 MED ORDER — NITROGLYCERIN 0.2 MG/ML ON CALL CATH LAB
INTRAVENOUS | Status: AC
  Filled 2011-10-09: qty 1

## 2011-10-09 MED ORDER — MORPHINE SULFATE 2 MG/ML IJ SOLN
2.0000 mg | Freq: Once | INTRAMUSCULAR | Status: AC
  Administered 2011-10-09: 2 mg via INTRAVENOUS
  Filled 2011-10-09: qty 2
  Filled 2011-10-09: qty 1

## 2011-10-09 MED ORDER — HEPARIN BOLUS VIA INFUSION
1000.0000 [IU] | Freq: Once | INTRAVENOUS | Status: AC
  Administered 2011-10-09: 1000 [IU] via INTRAVENOUS
  Filled 2011-10-09: qty 1000

## 2011-10-09 MED ORDER — INSULIN ASPART 100 UNIT/ML ~~LOC~~ SOLN
0.0000 [IU] | Freq: Three times a day (TID) | SUBCUTANEOUS | Status: DC
  Filled 2011-10-09: qty 3

## 2011-10-09 MED ORDER — FENTANYL CITRATE 0.05 MG/ML IJ SOLN
INTRAMUSCULAR | Status: AC
  Filled 2011-10-09: qty 2

## 2011-10-09 MED ORDER — BIVALIRUDIN 250 MG IV SOLR
INTRAVENOUS | Status: AC
  Filled 2011-10-09: qty 250

## 2011-10-09 MED ORDER — SODIUM CHLORIDE 0.9 % IV SOLN
1.0000 mL/kg/h | INTRAVENOUS | Status: AC
  Administered 2011-10-09: 1 mL/kg/h via INTRAVENOUS

## 2011-10-09 MED ORDER — LIDOCAINE HCL (PF) 1 % IJ SOLN
INTRAMUSCULAR | Status: AC
  Filled 2011-10-09: qty 30

## 2011-10-09 MED ORDER — MIDAZOLAM HCL 2 MG/2ML IJ SOLN
INTRAMUSCULAR | Status: AC
  Filled 2011-10-09: qty 2

## 2011-10-09 NOTE — H&P (View-Only) (Signed)
 Patient Name: Benjamin Harrison Date of Encounter: 10/09/2011     Principal Problem:  *NSTEMI (non-ST elevated myocardial infarction) Active Problems:  Coronary artery disease  Hypertension  Hypothyroidism  Hyperlipidemia  Diabetes mellitus    SUBJECTIVE  Had low-grade chest discomfort throughout the night.  Reports mild chest heaviness this am assoc with mild dyspnea.  CE mildly positive.  NPO for cath today.  CURRENT MEDS    . aspirin  324 mg Oral Pre-Cath  . aspirin EC  325 mg Oral Daily  . atorvastatin  80 mg Oral q1800  . clopidogrel  75 mg Oral Q breakfast  . famotidine  20 mg Oral Daily  . glimepiride  2 mg Oral QAC breakfast  . heparin  1,000 Units Intravenous Once  . irbesartan  150 mg Oral Daily  . levothyroxine  88 mcg Oral Daily  . metoprolol  25 mg Oral BID  . niacin  250 mg Oral QHS    OBJECTIVE  Filed Vitals:   10/09/11 0500 10/09/11 0600 10/09/11 0700 10/09/11 0800  BP: 94/62 104/67 101/69 95/64  Pulse: 62 69 69 66  Temp:      TempSrc:      Resp:    18  Height:      Weight: 175 lb 14.8 oz (79.8 kg)     SpO2: 96% 97% 97% 95%    Intake/Output Summary (Last 24 hours) at 10/09/11 0832 Last data filed at 10/09/11 0800  Gross per 24 hour  Intake  674.5 ml  Output    550 ml  Net  124.5 ml   Filed Weights   10/08/11 1514 10/09/11 0500  Weight: 175 lb (79.379 kg) 175 lb 14.8 oz (79.8 kg)    PHYSICAL EXAM  General: Pleasant, NAD. Neuro: Alert and oriented X 3. Moves all extremities spontaneously. Psych: Normal affect. HEENT:  Normal  Neck: Supple without bruits or JVD. Lungs:  Resp regular and unlabored, CTA. Heart: RRR - distant.  No s3, s4, or murmurs. Abdomen: Soft, non-tender, non-distended, BS + x 4.  Extremities: No clubbing, cyanosis or edema. DP/PT/Radials 2+ and equal bilaterally.  No femoral bruits.  Accessory Clinical Findings  CBC  Basename 10/09/11 0027 10/08/11 1008  WBC 6.4 6.4  NEUTROABS -- 4.4  HGB 12.3* 13.3    HCT 35.6* 38.3*  MCV 73.4* 73.4*  PLT 166 175   Basic Metabolic Panel  Basename 10/09/11 0027 10/08/11 1308 10/08/11 1008  NA 138 -- 137  K 3.8 -- 3.6  CL 103 -- 104  CO2 28 -- 25  GLUCOSE 155* -- 171*  BUN 15 -- 20  CREATININE 0.98 -- 1.01  CALCIUM 8.5 -- 8.7  MG -- 2.1 --  PHOS -- -- --   Cardiac Enzymes  Basename 10/09/11 0026 10/08/11 1800 10/08/11 1308  CKTOTAL 63 53 47  CKMB 4.5* 3.4 1.5  CKMBINDEX -- -- --  TROPONINI 1.92* 0.61* <0.30   Hemoglobin A1C  Basename 10/08/11 1308  HGBA1C 6.8*   Fasting Lipid Panel  Basename 10/09/11 0027  CHOL 78  HDL 22*  LDLCALC 40  TRIG 79  CHOLHDL 3.5  LDLDIRECT --   TELE  RSR  ECG  Rsr, 63, inf q's, no acute changes.  Radiology/Studies  Dg Chest 2 View  10/08/2011  *RADIOLOGY REPORT*  Clinical Data: Chest pain and left arm pain.  History of CABG and prior coronary stent placement.  CHEST - 2 VIEW  Comparison: 07/04/2011  Findings: Stable heart size status post   prior CABG.  There is no evidence of pulmonary edema or pleural fluid.  Chronic elevation of the right hemidiaphragm is stable.  The bony thorax is unremarkable.  IMPRESSION: No acute findings.  Original Report Authenticated By: GLENN T. YAMAGATA, M.D.    ASSESSMENT AND PLAN  1.  NSTEMI/CAD:  Pt cont to have mild chest discomfort all night long, which persists this am.  His VS are otw stable though mild hypotn prevents from titrating IV NTG.  Cont asa, bb, statin, plavix, heparin.  He is on for cath and I will discuss with cath lab scheduling to have him done sooner.  2.  HTN:  Stable.  Cont bb, arb.  3.  HL:  LDL 40.  Cont statin.  4.  Hypothyroidism:  On synthroid.  5.  Hyperglycemia:  Fasting gluc this am 155.  A1C is 6.8.  Will add cbg's.  Needs primary care f/u.  Signed, Christopher Berge NP Patient seen and examined and history reviewed. Agree with above findings and plan. Patient has a history of prior CABG. He had early failure of the SVG to  RCA and has had multiple stent procedures of native RCA- the last one year ago. Now with NSTEMI and ongoing chest pain. Will proceed with cardiac cath and possible PCI today. Patient familiar with procedure and all questions answered.   Kinda Pottle JordanMD 10/09/2011 11:14 AM    

## 2011-10-09 NOTE — Interval H&P Note (Signed)
History and Physical Interval Note:  10/09/2011 11:19 AM  Benjamin Harrison  has presented today for surgery, with the diagnosis of cp  The various methods of treatment have been discussed with the patient and family. After consideration of risks, benefits and other options for treatment, the patient has consented to  Procedure(s) (LRB): LEFT HEART CATHETERIZATION WITH CORONARY ANGIOGRAM (N/A) as a surgical intervention .  The patients' history has been reviewed, patient examined, no change in status, stable for surgery.  I have reviewed the patients' chart and labs.  Questions were answered to the patient's satisfaction.     [name], [facility] 10/09/2011 11:20 AM

## 2011-10-09 NOTE — Progress Notes (Signed)
ANTICOAGULATION CONSULT NOTE - Follow Up  Pharmacy Consult for Heparin Indication: chest pain/ACS  Patient Measurements: Height: 5\' 6"  (167.6 cm) Weight: 175 lb (79.379 kg) IBW/kg (Calculated) : 63.8  Heparin Dosing Weight: 79.4 kg  Labs:  Basename 10/09/11 0027 10/09/11 0026 10/08/11 1800 10/08/11 1759 10/08/11 1308 10/08/11 1008  HGB 12.3* -- -- -- -- 13.3  HCT 35.6* -- -- -- -- 38.3*  PLT 166 -- -- -- -- 175  APTT -- -- -- 104* -- --  LABPROT -- -- -- -- 14.3 --  INR -- -- -- -- 1.09 --  HEPARINUNFRC <0.10* -- -- 0.37 -- --  CREATININE 0.98 -- -- -- -- 1.01  CKTOTAL -- 63 53 -- 47 --  CKMB -- 4.5* 3.4 -- 1.5 --  TROPONINI -- 1.92* 0.61* -- <0.30 --   Estimated Creatinine Clearance: 65.5 ml/min (by C-G formula based on Cr of 0.98).  Assessment: 76 y.o. M with PMH significant for CAD requiring PCI (most recently in March 2012) on heparin for ACS sx while awaiting cardiac cath on 3/11.  Heparin level (< 0.1) is below-goal. No problem with line per RN.   Goal of Therapy:  Heparin level 0.3-0.7 units/ml   Plan:  1. Heparin IV bolus of 1000 units x 1, then increase IV infusion to 1250 units/hr.  2. Heparin level in 8 hours vs follow-up post-cath.  Lorre Munroe, PharmD. 10/09/2011 2:50 AM

## 2011-10-09 NOTE — Progress Notes (Signed)
Site area: right groin  Site Prior to Removal:  Level 0  Pressure Applied For 20 MINUTES    Minutes Beginning at 1455  Manual:   yes  Patient Status During Pull:  stable  Post Pull Groin Site:  Level 0  Post Pull Instructions Given:  yes  Post Pull Pulses Present:  yes  Dressing Applied:  yes  Comments:

## 2011-10-09 NOTE — Progress Notes (Signed)
Patient Name: Benjamin Harrison Date of Encounter: 10/09/2011     Principal Problem:  *NSTEMI (non-ST elevated myocardial infarction) Active Problems:  Coronary artery disease  Hypertension  Hypothyroidism  Hyperlipidemia  Diabetes mellitus    SUBJECTIVE  Had low-grade chest discomfort throughout the night.  Reports mild chest heaviness this am assoc with mild dyspnea.  CE mildly positive.  NPO for cath today.  CURRENT MEDS    . aspirin  324 mg Oral Pre-Cath  . aspirin EC  325 mg Oral Daily  . atorvastatin  80 mg Oral q1800  . clopidogrel  75 mg Oral Q breakfast  . famotidine  20 mg Oral Daily  . glimepiride  2 mg Oral QAC breakfast  . heparin  1,000 Units Intravenous Once  . irbesartan  150 mg Oral Daily  . levothyroxine  88 mcg Oral Daily  . metoprolol  25 mg Oral BID  . niacin  250 mg Oral QHS    OBJECTIVE  Filed Vitals:   10/09/11 0500 10/09/11 0600 10/09/11 0700 10/09/11 0800  BP: 94/62 104/67 101/69 95/64  Pulse: 62 69 69 66  Temp:      TempSrc:      Resp:    18  Height:      Weight: 175 lb 14.8 oz (79.8 kg)     SpO2: 96% 97% 97% 95%    Intake/Output Summary (Last 24 hours) at 10/09/11 0832 Last data filed at 10/09/11 0800  Gross per 24 hour  Intake  674.5 ml  Output    550 ml  Net  124.5 ml   Filed Weights   10/08/11 1514 10/09/11 0500  Weight: 175 lb (79.379 kg) 175 lb 14.8 oz (79.8 kg)    PHYSICAL EXAM  General: Pleasant, NAD. Neuro: Alert and oriented X 3. Moves all extremities spontaneously. Psych: Normal affect. HEENT:  Normal  Neck: Supple without bruits or JVD. Lungs:  Resp regular and unlabored, CTA. Heart: RRR - distant.  No s3, s4, or murmurs. Abdomen: Soft, non-tender, non-distended, BS + x 4.  Extremities: No clubbing, cyanosis or edema. DP/PT/Radials 2+ and equal bilaterally.  No femoral bruits.  Accessory Clinical Findings  CBC  Basename 10/09/11 0027 10/08/11 1008  WBC 6.4 6.4  NEUTROABS -- 4.4  HGB 12.3* 13.3    HCT 35.6* 38.3*  MCV 73.4* 73.4*  PLT 166 175   Basic Metabolic Panel  Basename 10/09/11 0027 10/08/11 1308 10/08/11 1008  NA 138 -- 137  K 3.8 -- 3.6  CL 103 -- 104  CO2 28 -- 25  GLUCOSE 155* -- 171*  BUN 15 -- 20  CREATININE 0.98 -- 1.01  CALCIUM 8.5 -- 8.7  MG -- 2.1 --  PHOS -- -- --   Cardiac Enzymes  Basename 10/09/11 0026 10/08/11 1800 10/08/11 1308  CKTOTAL 63 53 47  CKMB 4.5* 3.4 1.5  CKMBINDEX -- -- --  TROPONINI 1.92* 0.61* <0.30   Hemoglobin A1C  Basename 10/08/11 1308  HGBA1C 6.8*   Fasting Lipid Panel  Basename 10/09/11 0027  CHOL 78  HDL 22*  LDLCALC 40  TRIG 79  CHOLHDL 3.5  LDLDIRECT --   TELE  RSR  ECG  Rsr, 63, inf q's, no acute changes.  Radiology/Studies  Dg Chest 2 View  10/08/2011  *RADIOLOGY REPORT*  Clinical Data: Chest pain and left arm pain.  History of CABG and prior coronary stent placement.  CHEST - 2 VIEW  Comparison: 07/04/2011  Findings: Stable heart size status post  prior CABG.  There is no evidence of pulmonary edema or pleural fluid.  Chronic elevation of the right hemidiaphragm is stable.  The bony thorax is unremarkable.  IMPRESSION: No acute findings.  Original Report Authenticated By: Reola Calkins, M.D.    ASSESSMENT AND PLAN  1.  NSTEMI/CAD:  Pt cont to have mild chest discomfort all night long, which persists this am.  His VS are otw stable though mild hypotn prevents from titrating IV NTG.  Cont asa, bb, statin, plavix, heparin.  He is on for cath and I will discuss with cath lab scheduling to have him done sooner.  2.  HTN:  Stable.  Cont bb, arb.  3.  HL:  LDL 40.  Cont statin.  4.  Hypothyroidism:  On synthroid.  5.  Hyperglycemia:  Fasting gluc this am 155.  A1C is 6.8.  Will add cbg's.  Needs primary care f/u.  Signed, Nicolasa Ducking NP Patient seen and examined and history reviewed. Agree with above findings and plan. Patient has a history of prior CABG. He had early failure of the SVG to  RCA and has had multiple stent procedures of native RCA- the last one year ago. Now with NSTEMI and ongoing chest pain. Will proceed with cardiac cath and possible PCI today. Patient familiar with procedure and all questions answered.   Theron Arista JordanMD 10/09/2011 11:14 AM

## 2011-10-09 NOTE — CV Procedure (Signed)
Cardiac Catheterization Procedure Note  Name: Benjamin Harrison MRN: [id number] DOB: 30-Nov-1934  Procedure: Left Heart Cath, Selective Coronary Angiography, LV angiography, saphenous vein graft angiography, LIMA graft angiography,  PTCA of the distal right coronary.  Indication: 77 year old white male with history of coronary disease and prior coronary bypass surgery. 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 He has had multiple stenting procedures of the native right coronary the last one year ago. He presents now with a non-ST elevation myocardial infarction.   Diagnostic Procedure Details: The right groin was prepped, draped, and anesthetized with 1% lidocaine. Using the modified Seldinger technique, a 5 French sheath was introduced into the right femoral artery. Standard Judkins catheters were used for selective coronary angiography and left ventriculography. Catheter exchanges were performed over a wire.  The diagnostic procedure was well-tolerated without immediate complications.  PROCEDURAL FINDINGS Hemodynamics: AO 113/73 with a mean of 90 mm of mercury LV 114/11 mmHg  Coronary angiography: Coronary dominance: right  Left mainstem: There is 30% narrowing at the origin of the left main coronary. It is calcified.  Left anterior descending (LAD): The LAD is occluded after the first diagonal. The first diagonal has diffuse 90-95% disease proximally.  Left circumflex (LCx): The circumflex gives rise to 2 marginal branches. The first marginal branch has mild irregularities less than 20%. The mid circumflex is diffusely diseased up to 40%. The second marginal branch is supplied by a vein graft.  Right coronary artery (RCA): The right coronary is a dominant vessel. It is extensively stented from the proximal to the distal vessel. The proximal to mid vessel appears widely patent. The distal vessel demonstrates a focal 99% stenosis within the stent with TIMI  grade 1 flow distally. There is also a 90% stenosis at the origin of the right ventricular branch.  The saphenous vein graft to the right coronary is known to be occluded from prior studies.  The saphenous vein graft to the first diagonal is widely patent.  The saphenous vein graft to the second obtuse marginal vessel is widely patent  The LIMA graft to the LAD is widely patent. The distal LAD there is focal 70% stenosis.  Left ventriculography: Left ventricular systolic function is normal, LVEF is estimated at 55-65%, there is no significant mitral regurgitation   PCI Procedure Note:  Following the diagnostic procedure, the decision was made to proceed with PCI. The sheath was upsized to a 6 [place]. Weight-based bivalirudin was given for anticoagulation. Once a therapeutic ACT was achieved, a 6 [place] L4-4 guide catheter was inserted.  A pro-water coronary guidewire was used to cross the lesion.  We attempted to treat the lesion with a cutting balloon but were unable to pass this through the mid right coronary. The lesion was dilated with a 2.5 x 12 mm sprinter [place] balloon to 12 atmospheres.  Since this area has been stented twice before we did not place any additional stents.  Following PCI, there was 0% residual stenosis and TIMI-3 flow. Final angiography confirmed an excellent result. Femoral hemostasis was achieved with manual compression.  The patient tolerated the PCI procedure well. There were no immediate procedural complications.  The patient was transferred to the post catheterization recovery area for further monitoring.  PCI Data: Vessel - right coronary/Segment - distal Percent Stenosis (pre)  99% TIMI-flow 1 Balloon angioplasty with a 2.5 mm balloon Percent Stenosis (post) 0% TIMI-flow (post) 3  Final Conclusions:   1. Severe 3 vessel obstructive  coronary disease. There is severe in-stent restenosis in the distal right coronary. 2. Patent saphenous vein graft to the  diagonal. 3. Patent saphenous vein graft to the obtuse marginal vessel. 4. Occluded saphenous vein graft to the right coronary. 5. Patent LIMA graft to the LAD. 6. Normal left ventricular function. 7. Successful balloon angioplasty of the distal right coronary for in-stent restenosis.  Recommendations: Continue dual antiplatelet therapy and risk factor modification.  Ingrid Shifrin [place] 10/09/2011, 12:30 PM

## 2011-10-09 NOTE — Progress Notes (Signed)
UR Completed. Simmons, Geordie Nooney F [phone]  

## 2011-10-09 NOTE — ED Provider Notes (Signed)
Medical screening examination/treatment/procedure(s) were conducted as a shared visit with non-physician practitioner(s) and myself.  I personally evaluated the patient during the encounter Pt w hx cad, mid chest pain, similar to prior cardiac cp. Asa, ntg gttt, hep, cardiology called to admit. Chest cta, rrr.   Suzi Roots, MD 10/09/11 1110

## 2011-10-10 ENCOUNTER — Other Ambulatory Visit: Payer: Self-pay

## 2011-10-10 DIAGNOSIS — I214 Non-ST elevation (NSTEMI) myocardial infarction: Principal | ICD-10-CM

## 2011-10-10 LAB — BASIC METABOLIC PANEL
CO2: 30 mEq/L (ref 19–32)
GFR calc non Af Amer: 65 mL/min — ABNORMAL LOW (ref >90)
Glucose, Bld: 97 mg/dL (ref 70–99)
Potassium: 4 mEq/L (ref 3.5–5.1)
Sodium: 138 mEq/L (ref 135–145)

## 2011-10-10 LAB — CBC
Hemoglobin: 12.5 g/dL — ABNORMAL LOW (ref 13.0–17.0)
RBC: 4.93 MIL/uL (ref 4.22–5.81)

## 2011-10-10 LAB — GLUCOSE, CAPILLARY: Glucose-Capillary: 94 mg/dL (ref 70–99)

## 2011-10-10 MED ORDER — ASPIRIN 81 MG PO TABS: 81.0000 mg | ORAL_TABLET | Freq: Every day | ORAL | Status: DC

## 2011-10-10 MED FILL — Dextrose Inj 5%: INTRAVENOUS | Qty: 50 | Status: AC

## 2011-10-10 NOTE — Progress Notes (Signed)
CARDIAC REHAB PHASE I   PRE:  Rate/Rhythm: 64 SR    BP: sitting 118/79    SaO2:   MODE:  Ambulation: 300 ft   POST:  Rate/Rhythm: 74 SR    BP: sitting 124/76     SaO2:   Fatigues easily. Rest x1. Sts his legs get tired. Sts he tries to walk 30 min at home but tires easily. Ed completed. Pt not interested in CRPII due to copays. Pt sts he and his wife have never had outpt DM classes and would like to attend. [id number]  Harriet Masson CES, ACSM

## 2011-10-10 NOTE — Discharge Summary (Signed)
Patient seen and examined and history reviewed. Agree with above findings and plan. See note from earlier today.  [name] 10/10/2011 12:40 PM

## 2011-10-10 NOTE — Discharge Summary (Signed)
Patient ID: Benjamin Harrison,  MRN: [id number], DOB/AGE: [birth year] 77 y.o.  Admit date: 10/08/2011 Discharge date: 10/10/2011  Primary Care Provider: Kaleen Mask, MD Primary Cardiologist: Katherina Right, MD  Discharge Diagnoses Principal Problem:  *NSTEMI (non-ST elevated myocardial infarction) Active Problems:  Coronary artery disease  Hypertension  Hypothyroidism  Hyperlipidemia  Diabetes mellitus   Allergies Allergies  Allergen Reactions  . Ace Inhibitors Other (See Comments)    Unknown.    Procedures  Cardiac Catheterization and Percutaneous Coronary Intervention 10/09/2011  Hemodynamics: AO 113/73 with a mean of 90 mm of mercury LV 114/11 mmHg  Coronary angiography: Coronary dominance: right  Left mainstem: There is 30% narrowing at the origin of the left main coronary. It is calcified. Left anterior descending (LAD): The LAD is occluded after the first diagonal. The first diagonal has diffuse 90-95% disease proximally. Left circumflex (LCx): The circumflex gives rise to 2 marginal branches. The first marginal branch has mild irregularities less than 20%. The mid circumflex is diffusely diseased up to 40%. The second marginal branch is supplied by a vein graft. Right coronary artery (RCA): The right coronary is a dominant vessel. It is extensively stented from the proximal to the distal vessel. The proximal to mid vessel appears widely patent. The distal vessel demonstrates a focal 99% stenosis within the stent with TIMI grade 1 flow distally - THIS WAS TREATED WITH PTCA. There is also a 90% stenosis at the origin of the right ventricular branch. The saphenous vein graft to the right coronary is known to be occluded from prior studies. The saphenous vein graft to the first diagonal is widely patent. The saphenous vein graft to the second obtuse marginal vessel is widely patent The LIMA graft to the LAD is widely patent. The distal LAD there is focal 70%  stenosis. Left ventriculography: Left ventricular systolic function is normal, LVEF is estimated at 55-65%, there is no significant mitral regurgitation   History of Present Illness  77-year-old male with prior history of coronary artery disease status post coronary artery bypass grafting.  Patient had early failure of his vein graft to the right coronary artery and has since required multiple interventions to the native right coronary artery. He was in his usual state of health until the morning of admission when he awoke with sudden onset of substernal chest discomfort radiating to his back and left arm associated with lightheadedness, dizziness, diaphoresis and mild dyspnea.  After symptoms did not immediately improve with sublingual nitroglycerin, EMS was activated the patient was taken to [facility] where his ECG showed no acute changes and his initial troponin was normal. He was placed on heparin and IV nitroglycerin and admitted for further evaluation.  Hospital Course  Patient continued to have intermittent chest discomfort throughout the night of admission. His IV nitroglycerin was titrated resulting in a headache. He did rule in for non-ST elevation MI, eventually peaking his CK at 63, MB of 4.5, and troponin I at 1.92. Because of ongoing discomfort, patient was taken to the cardiac catheterization laboratory on the morning of March 11.  Diagnostic catheterization revealed a focal 99% in-stent restenosis within the distal right coronary artery. Otherwise, he had native multivessel disease with 3 of 4 patent grafts (the vein graft to the RCA was known to be occluded). The distal RCA was felt to be the infarct vessel and initially attempts were made to treat this area with cutting balloon angioplasty however because the device was unable to be advanced to the area  of stenosis, the area was treated with balloon angioplasty only. Additional stenting was not undertaken as the patient already has 2  stents in that area. Patient tolerated this procedure well and post procedure has had no recurrence of chest discomfort. He has been ambulating without symptoms or limitations this morning and will be discharged home today in good condition.  Discharge Vitals Blood pressure 118/79, pulse 64, temperature 98.3 F (36.8 C), temperature source Oral, resp. rate 20, height 5\' 6"  (1.676 m), weight 178 lb 2.1 oz (80.8 kg), SpO2 95.00%.  Filed Weights   10/09/11 0500 10/10/11 0015 10/10/11 0500  Weight: 175 lb 14.8 oz (79.8 kg) 178 lb 2.1 oz (80.8 kg) 178 lb 2.1 oz (80.8 kg)   Labs  CBC  Basename 10/10/11 0440 10/09/11 0027 10/08/11 1008  WBC 5.9 6.4 --  NEUTROABS -- -- 4.4  HGB 12.5* 12.3* --  HCT 36.1* 35.6* --  MCV 73.2* 73.4* --  PLT 163 166 --   Basic Metabolic Panel  Basename 10/10/11 0440 10/09/11 0027 10/08/11 1308  NA 138 138 --  K 4.0 3.8 --  CL 103 103 --  CO2 30 28 --  GLUCOSE 97 155* --  BUN 14 15 --  CREATININE 1.09 0.98 --  CALCIUM 8.7 8.5 --  MG -- -- 2.1  PHOS -- -- --   Cardiac Enzymes  Basename 10/09/11 0026 10/08/11 1800 10/08/11 1308  CKTOTAL 63 53 47  CKMB 4.5* 3.4 1.5  CKMBINDEX -- -- --  TROPONINI 1.92* 0.61* <0.30   Hemoglobin A1C  Basename 10/08/11 1308  HGBA1C 6.8*   Fasting Lipid Panel  Basename 10/09/11 0027  CHOL 78  HDL 22*  LDLCALC 40  TRIG 79  CHOLHDL 3.5  LDLDIRECT --   Disposition  Pt is being discharged home today in good condition.  Follow-up Plans & Appointments  Follow-up Information    Follow up with Kaleen Mask, MD. (as scheduled)       Follow up with Norma Fredrickson, NP on 10/17/2011. (2:00 PM)    Contact information:   [address], [address] [place] [address] [phone]          Discharge Medications  Medication List  As of 10/10/2011 10:14 AM   TAKE these medications         aspirin 81 MG tablet   Take 1 tablet (81 mg total) by mouth daily.      clopidogrel 75 MG tablet    Commonly known as: PLAVIX   Take 75 mg by mouth daily.      glimepiride 2 MG tablet   Commonly known as: AMARYL   Take 2 mg by mouth daily before breakfast.      levothyroxine 88 MCG tablet   Commonly known as: SYNTHROID, LEVOTHROID   Take 88 mcg by mouth daily.      metoprolol 50 MG tablet   Commonly known as: LOPRESSOR   Take 25 mg by mouth 2 (two) times daily.      NIACIN PO   Take 1 tablet by mouth daily.      nitroGLYCERIN 0.4 MG SL tablet   Commonly known as: NITROSTAT   Place 0.4 mg under the tongue every 5 (five) minutes as needed. For chest pain.      PEPCID 20 MG tablet   Generic drug: famotidine   Take 1 tablet (20 mg total) by mouth daily.      rosuvastatin 20 MG tablet   Commonly known as:  CRESTOR   Take 20 mg by mouth at bedtime.      valsartan 160 MG tablet   Commonly known as: DIOVAN   Take 160 mg by mouth daily.            Outstanding Labs/Studies  None  Duration of Discharge Encounter   Greater than 30 minutes including physician time.  Signed, Nicolasa Ducking NP 10/10/2011, 10:14 AM

## 2011-10-10 NOTE — Progress Notes (Signed)
Patient Name: Benjamin Harrison Date of Encounter: 10/10/2011     Principal Problem:  *NSTEMI (non-ST elevated myocardial infarction) Active Problems:  Coronary artery disease  Hypertension  Hypothyroidism  Hyperlipidemia  Diabetes mellitus    SUBJECTIVE  S/p PTCA of RCA 2/2 ISR.  No c/p or sob.  Ambulated last night w/o difficulty.  CURRENT MEDS    . aspirin  81 mg Oral Daily  . atorvastatin  80 mg Oral q1800  . clopidogrel  75 mg Oral Q breakfast  . famotidine  20 mg Oral Daily  . glimepiride  2 mg Oral QAC breakfast  . insulin aspart  0-9 Units Subcutaneous TID WC  . irbesartan  150 mg Oral Daily  . levothyroxine  88 mcg Oral Daily  . metoprolol  25 mg Oral BID  . niacin  250 mg Oral QHS    OBJECTIVE  Filed Vitals:   10/09/11 2213 10/10/11 0015 10/10/11 0443 10/10/11 0500  BP: 126/86 111/62 115/72   Pulse: 69 61 65   Temp:  98.8 F (37.1 C) 98.1 F (36.7 C)   TempSrc:  Oral Oral   Resp:  19 18   Height:      Weight:  178 lb 2.1 oz (80.8 kg)  178 lb 2.1 oz (80.8 kg)  SpO2:  98% 97%     Intake/Output Summary (Last 24 hours) at 10/10/11 0733 Last data filed at 10/09/11 2100  Gross per 24 hour  Intake 1405.3 ml  Output    920 ml  Net  485.3 ml   Filed Weights   10/09/11 0500 10/10/11 0015 10/10/11 0500  Weight: 175 lb 14.8 oz (79.8 kg) 178 lb 2.1 oz (80.8 kg) 178 lb 2.1 oz (80.8 kg)   PHYSICAL EXAM  General: Pleasant, NAD. Neuro: Alert and oriented X 3. Moves all extremities spontaneously. Psych: Normal affect. HEENT:  Normal  Neck: Supple without bruits or JVD. Lungs:  Resp regular and unlabored, CTA. Heart: RRR no s3, s4, or murmurs. Abdomen: Soft, non-tender, non-distended, BS + x 4.  Extremities: No clubbing, cyanosis or edema. DP/PT/Radials 2+ and equal bilaterally.  R groin site w/o bleeding, bruit, hematoma.  Accessory Clinical Findings  CBC  Basename 10/10/11 0440 10/09/11 0027 10/08/11 1008  WBC 5.9 6.4 --  NEUTROABS -- -- 4.4    HGB 12.5* 12.3* --  HCT 36.1* 35.6* --  MCV 73.2* 73.4* --  PLT 163 166 --   Basic Metabolic Panel  Basename 10/10/11 0440 10/09/11 0027 10/08/11 1308  NA 138 138 --  K 4.0 3.8 --  CL 103 103 --  CO2 30 28 --  GLUCOSE 97 155* --  BUN 14 15 --  CREATININE 1.09 0.98 --  CALCIUM 8.7 8.5 --  MG -- -- 2.1  PHOS -- -- --   Cardiac Enzymes  Basename 10/09/11 0026 10/08/11 1800 10/08/11 1308  CKTOTAL 63 53 47  CKMB 4.5* 3.4 1.5  CKMBINDEX -- -- --  TROPONINI 1.92* 0.61* <0.30   Hemoglobin A1C  Basename 10/08/11 1308  HGBA1C 6.8*   Fasting Lipid Panel  Basename 10/09/11 0027  CHOL 78  HDL 22*  LDLCALC 40  TRIG 79  CHOLHDL 3.5  LDLDIRECT --   TELE  SB/RSR  ECG  RSR, 61, inf q's.  No acute st/t changes.  Radiology/Studies  Dg Chest 2 View  10/08/2011  *RADIOLOGY REPORT*  Clinical Data: Chest pain and left arm pain.  History of CABG and prior coronary stent placement.  CHEST -  2 VIEW  Comparison: 07/04/2011  Findings: Stable heart size status post prior CABG.  There is no evidence of pulmonary edema or pleural fluid.  Chronic elevation of the right hemidiaphragm is stable.  The bony thorax is unremarkable.  IMPRESSION: No acute findings.  Original Report Authenticated By: Reola Calkins, M.D.   Cardiac Catheterization and Percutaneous Coronary Intervention 10/09/2011  Hemodynamics: AO 113/73 with a mean of 90 mm of mercury LV 114/11 mmHg  Coronary angiography: Coronary dominance: right  Left mainstem: There is 30% narrowing at the origin of the left main coronary. It is calcified. Left anterior descending (LAD): The LAD is occluded after the first diagonal. The first diagonal has diffuse 90-95% disease proximally. Left circumflex (LCx): The circumflex gives rise to 2 marginal branches. The first marginal branch has mild irregularities less than 20%. The mid circumflex is diffusely diseased up to 40%. The second marginal branch is supplied by a vein  graft. Right coronary artery (RCA): The right coronary is a dominant vessel. It is extensively stented from the proximal to the distal vessel. The proximal to mid vessel appears widely patent. The distal vessel demonstrates a focal 99% stenosis within the stent with TIMI grade 1 flow distally - THIS WAS TREATED WITH PTCA. There is also a 90% stenosis at the origin of the right ventricular branch. The saphenous vein graft to the right coronary is known to be occluded from prior studies. The saphenous vein graft to the first diagonal is widely patent. The saphenous vein graft to the second obtuse marginal vessel is widely patent The LIMA graft to the LAD is widely patent. The distal LAD there is focal 70% stenosis. Left ventriculography: Left ventricular systolic function is normal, LVEF is estimated at 55-65%, there is no significant mitral regurgitation   ASSESSMENT AND PLAN  1.  NSTEMI/CAD:  S/p cath/ptca of RCA 2/2 ISR.  No recurrent chest pain.  ECG baseline this am.  Cardiac rehab to see & ambulate.  Cont asa, statin, plavix, bb, arb.  2.  HTN:  Stable.  Cont bb, arb.  3.  HL:  LDL - 40.  Cont statin.  4.  Hypothyroidism:  On synthroid.  5.  Hyperglycemia:  A1c 6.8.  Fasting gluc 97 this am.  SSI added yesterday though has not required coverage.  F/u with pcp.  6.  Dispo:  Ambulate.  prob d/c this afternoon.  Signed, Nicolasa Ducking NP Patient seen and examined and history reviewed. Agree with above findings and plan. Patient without recurrent chest pain with ambulation. Lungs clear. No gallop or murmur. Cath site without hematoma. Labs stable. Will plan on discharge today. Follow up with Dr. Elease Hashimoto in 2 weeks.  [name] 10/10/2011 8:53 AM

## 2011-10-10 NOTE — Progress Notes (Signed)
Clinical Social Worker received referral for advance directive. CSW met with patient and spouse and provided the advance directive packet. CSW answered patient's questions regarding form and where to get notarized when it is complete. CSW will sign off as social work intervention is no longer needed. Please consult [place] again if new needs arises.   Rozetta Nunnery MSW, [employer] [phone]

## 2011-10-11 ENCOUNTER — Telehealth: Payer: Self-pay | Admitting: Pharmacist

## 2011-10-11 NOTE — Telephone Encounter (Signed)
Spoke with pt.  He is not having any issues since hospitalization.  Wants to discuss his aspirin dose with Dr. Elease Hashimoto.  He had rescheduled his post-hospital visit with Lawson Fiscal to 3 weeks later with Dr. Elease Hashimoto.  Explained to pt we really would like to see him within 7 days due to his high risk.  He is agreeable.  Rescheduled appt with Lawson Fiscal, NP for this Friday.

## 2011-10-13 ENCOUNTER — Encounter: Payer: Self-pay | Admitting: Nurse Practitioner

## 2011-10-13 ENCOUNTER — Ambulatory Visit (INDEPENDENT_AMBULATORY_CARE_PROVIDER_SITE_OTHER): Payer: Medicare Other | Admitting: Nurse Practitioner

## 2011-10-13 VITALS — BP 132/88 | HR 68 | Ht 66.0 in | Wt 178.0 lb

## 2011-10-13 DIAGNOSIS — I10 Essential (primary) hypertension: Secondary | ICD-10-CM

## 2011-10-13 DIAGNOSIS — I214 Non-ST elevation (NSTEMI) myocardial infarction: Secondary | ICD-10-CM

## 2011-10-13 MED ORDER — NITROGLYCERIN 0.4 MG SL SUBL: 0.4000 mg | SUBLINGUAL_TABLET | SUBLINGUAL | Status: DC | PRN

## 2011-10-13 NOTE — Patient Instructions (Signed)
Minimize your activities for another week or so.   No driving for another week.  Stay on your current medicines.  I have refilled your NTG.  Keep your appointment with Dr. Elease Hashimoto in April.   Call the [facility] office at [phone] if you have any questions, problems or concerns.

## 2011-10-13 NOTE — Progress Notes (Addendum)
[name] Date of Birth: Dec 24, 1934 Medical Record #[id number]  History of Present Illness: Mr. Monier is seen back today for a post hospital visit. He is seen for Dr. Elease Hashimoto. He has had a recent NSTEMI with a peak troponin of 1.92. He has known CAD with remote CABG. He had early failure of his vein graft to the RCA and has since required multiple interventions to the native RCA. During this last admission, he underwent repeat cath native multivessel disease with 3 of 4 grafts patent. The distal RCA was felt to be the infarct vessel and initially attempts were made to treat the area with cutting balloon angioplasty, howevere, because the device was unable to be advanced to the area of stenosis, the area was only treated with balloon angioplasty. Additional stenting was not undertaken as the patient already had 2 stents in that area. His EF is 55 to 65%. He remains on chronic Plavix.   He was discharged on the 13th of March. Phone contact was made by Weston Brass, PharmD, on the 14th of March.   He comes in today. He says he is still a little weak. He has only been home 2 days. Chest is a little sore but notes that overall, "it is a lot better". No symptoms like what led to his admission on Sunday. Not short of breath. Is not doing much in the way of activities. His aspirin dose was cut to 81 mg daily. No other medicine changes. No problems with his groin. Mild bruising reported.   Current Outpatient Prescriptions on File Prior to Visit  Medication Sig Dispense Refill  . aspirin 81 MG tablet Take 1 tablet (81 mg total) by mouth daily.      . clopidogrel (PLAVIX) 75 MG tablet Take 75 mg by mouth daily.      . famotidine (PEPCID) 20 MG tablet Take 1 tablet (20 mg total) by mouth daily.  30 tablet    . glimepiride (AMARYL) 2 MG tablet Take 2 mg by mouth daily before breakfast.        . levothyroxine (SYNTHROID, LEVOTHROID) 88 MCG tablet Take 88 mcg by mouth daily.        . nitroGLYCERIN  (NITROSTAT) 0.4 MG SL tablet Place 0.4 mg under the tongue every 5 (five) minutes as needed. For chest pain.      . rosuvastatin (CRESTOR) 20 MG tablet Take 20 mg by mouth at bedtime.      . valsartan (DIOVAN) 160 MG tablet Take 160 mg by mouth daily.      Marland Kitchen DISCONTD: metoprolol (LOPRESSOR) 50 MG tablet Take 25 mg by mouth 2 (two) times daily.          Allergies  Allergen Reactions  . Ace Inhibitors Other (See Comments)    Unknown.    Past Medical History  Diagnosis Date  . Coronary artery disease     Prior inferior MI with remote CABG; Had early failure of his vein graft to the RCA and has had multiple PCIs to the native RCA.   . MI (myocardial infarction)     STATUS POST PTC AND STENTING OF HIS MID RCA  . Hypertension   . Hypothyroidism   . Hyperlipidemia   . Chest pain     HEAVINESS  . SOB (shortness of breath) on exertion   . Personal history of tobacco use     REMOTE IN THE PAST  . Diabetes mellitus   . Dyslipidemia   . Osteoarthritis  End-stage osteoarthritis, right knee, medial compartment    Past Surgical History  Procedure Date  . Coronary artery bypass graft   . Cardiac catheterization   . Knee arthroscopy 1999 and 2004  . Hernia repair 1985    History  Smoking status  . Former Smoker  . Quit date: 08/01/1975  Smokeless tobacco  . Not on file    History  Alcohol Use No    Family History  Problem Relation Age of Onset  . Hypertension Mother   . Cancer Sister     Throat Cancer  . Heart disease Brother   . Pneumonia Brother   . Heart disease Brother   . Cancer Brother     Prostate Cancer    Review of Systems: The review of systems is per the HPI.  All other systems were reviewed and are negative.  Physical Exam: BP 132/88  Pulse 68  Ht 5\' 6"  (1.676 m)  Wt 178 lb (80.74 kg)  BMI 28.73 kg/m2 Patient is very pleasant and in no acute distress. Skin is warm and dry. Color is normal.  HEENT is unremarkable. Normocephalic/atraumatic. PERRL.  Sclera are nonicteric. Neck is supple. No masses. No JVD. Lungs are clear. Cardiac exam shows a regular rate and rhythm. Soft outflow murmur. Abdomen is obese but soft. Extremities are without edema. Gait and ROM are intact. No gross neurologic deficits noted.    Assessment / Plan:

## 2011-10-13 NOTE — Assessment & Plan Note (Signed)
Patient has had recent NSTEMI. He has known CAD with remote CABG. Has known occluded SVG to the RCA. Has had multiple PCI's to the native RCA. Now s/p angioplasty only to the RCA. Doing ok. Has only been home for 2 days. Still a little weak. He is to minimize his activities for another week or so. No driving for another week. He has an appointment in early April with Dr. Elease Hashimoto. I have refilled his NTG today. We will see him back in 2 weeks. He does not wish to go to cardiac rehab. Patient is agreeable to this plan and will call if any problems develop in the interim.

## 2011-10-13 NOTE — Assessment & Plan Note (Signed)
Blood pressure looks ok. No change in his medicines.  

## 2011-10-16 ENCOUNTER — Other Ambulatory Visit: Payer: Self-pay | Admitting: *Deleted

## 2011-10-16 MED ORDER — HYDROCHLOROTHIAZIDE 25 MG PO TABS: 25.0000 mg | ORAL_TABLET | Freq: Every day | ORAL | Status: DC

## 2011-10-16 NOTE — Telephone Encounter (Signed)
Fax Received. Refill Completed. Kassey Laforest Chowoe (R.M.A)   

## 2011-10-17 ENCOUNTER — Encounter: Payer: Medicare Other | Admitting: Nurse Practitioner

## 2011-10-31 ENCOUNTER — Other Ambulatory Visit (INDEPENDENT_AMBULATORY_CARE_PROVIDER_SITE_OTHER): Payer: Medicare Other

## 2011-10-31 ENCOUNTER — Encounter: Payer: Self-pay | Admitting: Cardiovascular Disease

## 2011-10-31 ENCOUNTER — Ambulatory Visit (INDEPENDENT_AMBULATORY_CARE_PROVIDER_SITE_OTHER): Payer: Medicare Other | Admitting: Cardiovascular Disease

## 2011-10-31 VITALS — BP 126/86 | HR 50 | Ht 66.0 in | Wt 177.4 lb

## 2011-10-31 DIAGNOSIS — I2510 Atherosclerotic heart disease of native coronary artery without angina pectoris: Secondary | ICD-10-CM

## 2011-10-31 DIAGNOSIS — E785 Hyperlipidemia, unspecified: Secondary | ICD-10-CM

## 2011-10-31 DIAGNOSIS — I10 Essential (primary) hypertension: Secondary | ICD-10-CM

## 2011-10-31 LAB — HEPATIC FUNCTION PANEL
AST: 20 U/L (ref 0–37)
Alkaline Phosphatase: 72 U/L (ref 39–117)
Total Bilirubin: 0.8 mg/dL (ref 0.3–1.2)

## 2011-10-31 LAB — BASIC METABOLIC PANEL
BUN: 19 mg/dL (ref 6–23)
GFR: 66.58 mL/min (ref >60.00)
Potassium: 3.9 mEq/L (ref 3.5–5.1)
Sodium: 141 mEq/L (ref 135–145)

## 2011-10-31 LAB — LIPID PANEL
LDL Cholesterol: 43 mg/dL (ref 0–99)
VLDL: 12.6 mg/dL (ref 0.0–40.0)

## 2011-10-31 MED ORDER — LOSARTAN POTASSIUM 100 MG PO TABS: 100.0000 mg | ORAL_TABLET | Freq: Every day | ORAL | Status: DC

## 2011-10-31 NOTE — Assessment & Plan Note (Signed)
His HDL is 22. We will see if he is a candidate for the ACCERERATE study.

## 2011-10-31 NOTE — Assessment & Plan Note (Signed)
He wants to try the generic angiotensin blocker. We will stop his Diovan and start him on losartan 100 mg a day. He'll continue to measure his blood pressure.

## 2011-10-31 NOTE — Assessment & Plan Note (Signed)
Pt had another MI several weeks ago. He still remains very weak. Part of this is due to his bradycardia which may be in part due to his metoprolol therapy and also in part due to his inferior wall myocardial infarction.  I discussed the importance of staying on metoprolol but I did state that we may consider stopping it or decreasing the dose if he just simply can't tolerate it. I would prefer that he stay on the current dose of metoprolol and hopefully he will gradually healed up from his myocardial infarction.  I will see him again in 3 months. He is willing to participate in the [facility] study.

## 2011-10-31 NOTE — Patient Instructions (Addendum)
Your physician recommends that you schedule a follow-up appointment in: 3 months or sooner if needed/ WITH LABS  Your physician has recommended you make the following change in your medication:   STOP DIOVAN  START LOSARTAN 100MG  DAILY/// TAKE BLOOD PRESSURE AT LEAST WEEKLY, BEST IF CAN CHECK DAILY, ONE HOUR AFTER MEDS TAKEN AND IF CAN ONCE MORE BEFORE BED, BRING RESULTS ON NEXT APP. CALL WITH QUESTIONS. JODETTE RN  Your physician recommends that you return for lab work in: 3 MONTHS BMP

## 2011-10-31 NOTE — Progress Notes (Signed)
Benjamin Harrison Date of Birth  Oct 26, 1934 [facility]     [place] [facility]  [address]    [address]   [address] [place], [place]  [address]    [place], [place]  [address] [phone]  Fax  [phone]  [phone]  Fax [phone]   Problem list:  1. Coronary artery disease: Status post CABG in2001. He status post stenting of his mid right coronary artery in 10/17/2010 2. Hypertension 3. Hypothyroidism 4. Hyperlipidemia  History of Present Illness:  Benjamin Harrison is a 77 year old gentleman with a history of coronary artery disease. He status post PTCA and stenting of his right coronary. He had repeat stenting of his right coronary artery as recently as March, 2012.  He had another heart attack and had severe stenosis of his distal right coronary artery stent. Dr. Swaziland opened up the stent in March, 2013.  Saphenous vein graft to right coronary artery is occluded.  Since that time he's had persistent headaches and generalized weakness.   He's continued to have some episodes of chest pain.  He's also had some episodes of lightheadedness.   He also status post coronary artery bypass grafting. Also has a history of hypertension, hyperlipidemia, and hypothyroidism   Current Outpatient Prescriptions on File Prior to Visit  Medication Sig Dispense Refill  . aspirin 81 MG tablet Take 1 tablet (81 mg total) by mouth daily.      . clopidogrel (PLAVIX) 75 MG tablet Take 75 mg by mouth daily.      . famotidine (PEPCID) 20 MG tablet Take 1 tablet (20 mg total) by mouth daily.  30 tablet    . glimepiride (AMARYL) 2 MG tablet Take 2 mg by mouth daily before breakfast.        . hydrochlorothiazide (HYDRODIURIL) 25 MG tablet Take 1 tablet (25 mg total) by mouth daily.  30 tablet  5  . levothyroxine (SYNTHROID, LEVOTHROID) 88 MCG tablet Take 88 mcg by mouth daily.        . metoprolol tartrate (LOPRESSOR) 25 MG tablet Take 25 mg by mouth 2 (two) times daily.      .  nitroGLYCERIN (NITROSTAT) 0.4 MG SL tablet Place 1 tablet (0.4 mg total) under the tongue every 5 (five) minutes as needed. For chest pain.  25 tablet  6  . rosuvastatin (CRESTOR) 20 MG tablet Take 20 mg by mouth at bedtime.      . valsartan (DIOVAN) 160 MG tablet Take 160 mg by mouth daily.      Marland Kitchen DISCONTD: atorvastatin (LIPITOR) 40 MG tablet Take 1 tablet (40 mg total) by mouth daily.  30 tablet  5    Allergies  Allergen Reactions  . Codeine     Makes pt Sick    Past Medical History  Diagnosis Date  . Coronary artery disease     Prior inferior MI with remote CABG; Had early failure of his vein graft to the RCA and has had multiple PCIs to the native RCA.   . MI (myocardial infarction)     STATUS POST PTC AND STENTING OF HIS MID RCA  . Hypertension   . Hypothyroidism   . Hyperlipidemia   . Chest pain     HEAVINESS  . SOB (shortness of breath) on exertion   . Personal history of tobacco use     REMOTE IN THE PAST  . Diabetes mellitus   . Dyslipidemia   . Osteoarthritis     End-stage osteoarthritis, right  knee, medial compartment    Past Surgical History  Procedure Date  . Coronary artery bypass graft   . Cardiac catheterization   . Knee arthroscopy 1999 and 2004  . Hernia repair 1985    History  Smoking status  . Former Smoker  . Quit date: 08/01/1975  Smokeless tobacco  . Not on file    History  Alcohol Use No    Family History  Problem Relation Age of Onset  . Hypertension Mother   . Cancer Sister     Throat Cancer  . Heart disease Brother   . Pneumonia Brother   . Heart disease Brother   . Cancer Brother     Prostate Cancer    Reviw of Systems:  Reviewed in the HPI.  All other systems are negative.  Physical Exam: BP 126/86  Pulse 50  Ht 5\' 6"  (1.676 m)  Wt 177 lb 6.4 oz (80.468 kg)  BMI 28.63 kg/m2 The patient is alert and oriented x 3.  The mood and affect are normal.   Skin: warm and dry.  Color is normal.    HEENT:    Normocephalic/atraumatic. His mucous membranes are moist. His neck is supple. Is no JVD. His carotids are normal.  Lungs: Lungs are clear. His back is nontender.   Heart: Regular rate S1-S2. He has no murmurs.    Abdomen: Has good bowel sounds. He is mildly obese. There is no hepatosplenomegaly.  Extremities:  No clubbing cyanosis or edema the  Neuro:  Neuro exam is nonfocal. His gait is normal.    ECG:  Assessment / Plan:

## 2011-11-10 DIAGNOSIS — I10 Essential (primary) hypertension: Secondary | ICD-10-CM

## 2011-11-10 DIAGNOSIS — I214 Non-ST elevation (NSTEMI) myocardial infarction: Secondary | ICD-10-CM

## 2011-11-14 ENCOUNTER — Other Ambulatory Visit: Payer: Self-pay | Admitting: *Deleted

## 2011-11-14 MED ORDER — METOPROLOL TARTRATE 25 MG PO TABS: 25.0000 mg | ORAL_TABLET | Freq: Two times a day (BID) | ORAL | Status: DC

## 2011-11-14 NOTE — Telephone Encounter (Signed)
Fax Received. Refill Completed. Benjamin Harrison (R.M.A)   

## 2011-11-16 ENCOUNTER — Other Ambulatory Visit: Payer: Medicare Other

## 2011-11-27 ENCOUNTER — Other Ambulatory Visit: Payer: Self-pay | Admitting: *Deleted

## 2011-11-27 MED ORDER — CLOPIDOGREL BISULFATE 75 MG PO TABS: 75.0000 mg | ORAL_TABLET | Freq: Every day | ORAL | Status: DC

## 2011-11-27 NOTE — Telephone Encounter (Signed)
Fax Received. Refill Completed. Taariq Leitz Chowoe (R.M.A)   

## 2012-01-26 ENCOUNTER — Encounter: Payer: Self-pay | Admitting: Cardiovascular Disease

## 2012-01-26 ENCOUNTER — Ambulatory Visit (INDEPENDENT_AMBULATORY_CARE_PROVIDER_SITE_OTHER): Payer: Medicare Other | Admitting: Cardiovascular Disease

## 2012-01-26 VITALS — BP 145/92 | HR 56 | Ht 66.0 in | Wt 180.0 lb

## 2012-01-26 DIAGNOSIS — E785 Hyperlipidemia, unspecified: Secondary | ICD-10-CM

## 2012-01-26 DIAGNOSIS — I2510 Atherosclerotic heart disease of native coronary artery without angina pectoris: Secondary | ICD-10-CM

## 2012-01-26 DIAGNOSIS — R5383 Other fatigue: Secondary | ICD-10-CM

## 2012-01-26 NOTE — Assessment & Plan Note (Addendum)
He still has occasional episodes of chest pain. He has moderate to severe disease involving his right coronary artery. This has been opened multiple times.  I've asked him to cut back eating the crackers and peanut butter. I suspect this is causing his triglyceride levels to increase. We talked about increasing his exercise. He also notes his glucose levels have been mildly to moderately elevated. Think that he needs better control those.  I don't think that he needs a Myoview study. I would like to get an echocardiogram since she's having significant dyspnea.  I'll see him again in 6 months for followup office visit, EKG, and fasting labs.

## 2012-01-26 NOTE — Patient Instructions (Addendum)
Your physician has requested that you have an echocardiogram. Echocardiography is a painless test that uses sound waves to create images of your heart. It provides your doctor with information about the size and shape of your heart and how well your heart's chambers and valves are working. This procedure takes approximately one hour. There are no restrictions for this procedure.  Your physician wants you to follow-up in: 6 months with ekg  You will receive a reminder letter in the mail two months in advance. If you don't receive a letter, please call our office to schedule the follow-up appointment.   Your physician recommends that you return for a FASTING lipid profile: 6 months bmet lipid hepatic

## 2012-01-26 NOTE — Progress Notes (Signed)
Benjamin Harrison Date of Birth  Aug 19, 1934 [facility]     [place] Office  [address]    [address]   [address] [place], [place]  [address]    [place], [place]  [address] [phone]  Fax  [phone]  [phone]  Fax [phone]   Problem list:  1. Coronary artery disease: Status post CABG in2001. He status post stenting of his mid right coronary artery in 10/17/2010 2. Hypertension 3. Hypothyroidism 4. Hyperlipidemia 5. Diabetes mellitus  History of Present Illness:  Benjamin Harrison is a 77 year old gentleman with a history of coronary artery disease. He status post PTCA and stenting of his right coronary. He had repeat stenting of his right coronary artery as recently as March, 2012.  He had another heart attack and had severe stenosis of his distal right coronary artery stent. Dr. Swaziland opened up the stent in March, 2013.  Saphenous vein graft to right coronary artery is occluded.  Since that time he's had persistent headaches and generalized weakness.   He's continued to have some episodes of chest pain.  He's also had some episodes of lightheadedness.  He's feeling a bit better since I saw him several months ago. He still is not able to exercise with about 15 or 20 minutes. Is clear that he still eats salty foods. He enjoys peanut butter on Ritz crackers almost every night. He also eats occasional country ham and gravy biscuits.   Current Outpatient Prescriptions on File Prior to Visit  Medication Sig Dispense Refill  . aspirin 81 MG tablet Take 1 tablet (81 mg total) by mouth daily.      . clopidogrel (PLAVIX) 75 MG tablet Take 1 tablet (75 mg total) by mouth daily.  30 tablet  5  . famotidine (PEPCID) 20 MG tablet Take 1 tablet (20 mg total) by mouth daily.  30 tablet    . glimepiride (AMARYL) 2 MG tablet Take 2 mg by mouth daily before breakfast.        . hydrochlorothiazide (HYDRODIURIL) 25 MG tablet Take 1 tablet (25 mg total) by mouth daily.  30  tablet  5  . levothyroxine (SYNTHROID, LEVOTHROID) 88 MCG tablet Take 88 mcg by mouth daily.        Marland Kitchen losartan (COZAAR) 100 MG tablet Take 1 tablet (100 mg total) by mouth daily.  30 tablet  5  . metoprolol tartrate (LOPRESSOR) 25 MG tablet Take 1 tablet (25 mg total) by mouth 2 (two) times daily.  60 tablet  5  . nitroGLYCERIN (NITROSTAT) 0.4 MG SL tablet Place 1 tablet (0.4 mg total) under the tongue every 5 (five) minutes as needed. For chest pain.  25 tablet  6  . rosuvastatin (CRESTOR) 20 MG tablet Take 20 mg by mouth at bedtime.      Marland Kitchen DISCONTD: atorvastatin (LIPITOR) 40 MG tablet Take 1 tablet (40 mg total) by mouth daily.  30 tablet  5    Allergies  Allergen Reactions  . Codeine     Makes pt Sick    Past Medical History  Diagnosis Date  . Coronary artery disease     Prior inferior MI with remote CABG; Had early failure of his vein graft to the RCA and has had multiple PCIs to the native RCA.   . MI (myocardial infarction)     STATUS POST PTC AND STENTING OF HIS MID RCA  . Hypertension   . Hypothyroidism   . Hyperlipidemia   . Chest  pain     HEAVINESS  . SOB (shortness of breath) on exertion   . Personal history of tobacco use     REMOTE IN THE PAST  . Diabetes mellitus   . Dyslipidemia   . Osteoarthritis     End-stage osteoarthritis, right knee, medial compartment    Past Surgical History  Procedure Date  . Coronary artery bypass graft   . Cardiac catheterization   . Knee arthroscopy 1999 and 2004  . Hernia repair 1985    History  Smoking status  . Former Smoker  . Quit date: 08/01/1975  Smokeless tobacco  . Not on file    History  Alcohol Use No    Family History  Problem Relation Age of Onset  . Hypertension Mother   . Cancer Sister     Throat Cancer  . Heart disease Brother   . Pneumonia Brother   . Heart disease Brother   . Cancer Brother     Prostate Cancer    Reviw of Systems:  Reviewed in the HPI.  All other systems are  negative.  Physical Exam: BP 145/92  Pulse 56  Ht 5\' 6"  (1.676 m)  Wt 180 lb (81.647 kg)  BMI 29.05 kg/m2 The patient is alert and oriented x 3.  The mood and affect are normal.   Skin: warm and dry.  Color is normal.    HEENT:   Normocephalic/atraumatic. His mucous membranes are moist. His neck is supple. Is no JVD. His carotids are normal.  Lungs: Lungs are clear. His back is nontender.   Heart: Regular rate S1-S2. He has no murmurs.    Abdomen: Has good bowel sounds. He is mildly obese. There is no hepatosplenomegaly.  Extremities:  No clubbing cyanosis or edema the  Neuro:  Neuro exam is nonfocal. His gait is normal.    ECG:  Assessment / Plan:

## 2012-01-29 ENCOUNTER — Ambulatory Visit (HOSPITAL_COMMUNITY): Payer: Medicare Other | Attending: Cardiology | Admitting: Radiology

## 2012-01-29 DIAGNOSIS — R5383 Other fatigue: Secondary | ICD-10-CM

## 2012-01-29 DIAGNOSIS — I10 Essential (primary) hypertension: Secondary | ICD-10-CM | POA: Insufficient documentation

## 2012-01-29 DIAGNOSIS — I2510 Atherosclerotic heart disease of native coronary artery without angina pectoris: Secondary | ICD-10-CM | POA: Insufficient documentation

## 2012-01-29 DIAGNOSIS — I517 Cardiomegaly: Secondary | ICD-10-CM | POA: Insufficient documentation

## 2012-01-29 DIAGNOSIS — E119 Type 2 diabetes mellitus without complications: Secondary | ICD-10-CM | POA: Insufficient documentation

## 2012-01-29 DIAGNOSIS — R5381 Other malaise: Secondary | ICD-10-CM | POA: Insufficient documentation

## 2012-01-29 DIAGNOSIS — E785 Hyperlipidemia, unspecified: Secondary | ICD-10-CM | POA: Insufficient documentation

## 2012-01-29 DIAGNOSIS — I059 Rheumatic mitral valve disease, unspecified: Secondary | ICD-10-CM | POA: Insufficient documentation

## 2012-01-29 NOTE — Progress Notes (Signed)
Echocardiogram performed.  

## 2012-04-12 ENCOUNTER — Other Ambulatory Visit: Payer: Self-pay | Admitting: Cardiovascular Disease

## 2012-04-12 NOTE — Telephone Encounter (Signed)
Fax Received. Refill Completed. Benjamin Harrison (R.M.A)   

## 2012-05-04 ENCOUNTER — Other Ambulatory Visit: Payer: Self-pay | Admitting: Cardiovascular Disease

## 2012-05-06 NOTE — Telephone Encounter (Signed)
Fax Received. Refill Completed. Benjamin Harrison (R.M.A)   

## 2012-05-11 ENCOUNTER — Other Ambulatory Visit: Payer: Self-pay | Admitting: Cardiovascular Disease

## 2012-05-13 NOTE — Telephone Encounter (Signed)
Fax Received. Refill Completed. Benjamin Harrison (R.M.A)   

## 2012-05-21 ENCOUNTER — Other Ambulatory Visit: Payer: Self-pay | Admitting: *Deleted

## 2012-05-21 MED ORDER — CLOPIDOGREL BISULFATE 75 MG PO TABS: 75.0000 mg | ORAL_TABLET | Freq: Every day | ORAL | Status: DC

## 2012-06-05 ENCOUNTER — Inpatient Hospital Stay (HOSPITAL_COMMUNITY)
Admission: EM | Admit: 2012-06-05 | Discharge: 2012-06-08 | DRG: 287 | Disposition: A | Discharge status: Home / Self Care | Payer: Medicare Other | Attending: Internal Medicine | Admitting: Internal Medicine

## 2012-06-05 ENCOUNTER — Encounter (HOSPITAL_COMMUNITY): Payer: Self-pay | Admitting: Family Medicine

## 2012-06-05 ENCOUNTER — Telehealth: Payer: Self-pay | Admitting: Cardiovascular Disease

## 2012-06-05 ENCOUNTER — Emergency Department (HOSPITAL_COMMUNITY): Payer: Medicare Other

## 2012-06-05 DIAGNOSIS — I10 Essential (primary) hypertension: Secondary | ICD-10-CM

## 2012-06-05 DIAGNOSIS — Z8249 Family history of ischemic heart disease and other diseases of the circulatory system: Secondary | ICD-10-CM

## 2012-06-05 DIAGNOSIS — I2510 Atherosclerotic heart disease of native coronary artery without angina pectoris: Principal | ICD-10-CM

## 2012-06-05 DIAGNOSIS — Z7902 Long term (current) use of antithrombotics/antiplatelets: Secondary | ICD-10-CM

## 2012-06-05 DIAGNOSIS — D696 Thrombocytopenia, unspecified: Secondary | ICD-10-CM

## 2012-06-05 DIAGNOSIS — T82897A Other specified complication of cardiac prosthetic devices, implants and grafts, initial encounter: Secondary | ICD-10-CM | POA: Diagnosis present

## 2012-06-05 DIAGNOSIS — Z7982 Long term (current) use of aspirin: Secondary | ICD-10-CM

## 2012-06-05 DIAGNOSIS — E785 Hyperlipidemia, unspecified: Secondary | ICD-10-CM

## 2012-06-05 DIAGNOSIS — M1990 Unspecified osteoarthritis, unspecified site: Secondary | ICD-10-CM | POA: Diagnosis present

## 2012-06-05 DIAGNOSIS — I2582 Chronic total occlusion of coronary artery: Secondary | ICD-10-CM | POA: Diagnosis present

## 2012-06-05 DIAGNOSIS — I252 Old myocardial infarction: Secondary | ICD-10-CM

## 2012-06-05 DIAGNOSIS — E119 Type 2 diabetes mellitus without complications: Secondary | ICD-10-CM | POA: Diagnosis present

## 2012-06-05 DIAGNOSIS — Z87891 Personal history of nicotine dependence: Secondary | ICD-10-CM

## 2012-06-05 DIAGNOSIS — Y92009 Unspecified place in unspecified non-institutional (private) residence as the place of occurrence of the external cause: Secondary | ICD-10-CM

## 2012-06-05 DIAGNOSIS — Z79899 Other long term (current) drug therapy: Secondary | ICD-10-CM

## 2012-06-05 DIAGNOSIS — I200 Unstable angina: Secondary | ICD-10-CM

## 2012-06-05 DIAGNOSIS — Z9861 Coronary angioplasty status: Secondary | ICD-10-CM

## 2012-06-05 DIAGNOSIS — Y840 Cardiac catheterization as the cause of abnormal reaction of the patient, or of later complication, without mention of misadventure at the time of the procedure: Secondary | ICD-10-CM | POA: Diagnosis present

## 2012-06-05 DIAGNOSIS — I25810 Atherosclerosis of coronary artery bypass graft(s) without angina pectoris: Secondary | ICD-10-CM | POA: Diagnosis present

## 2012-06-05 DIAGNOSIS — E039 Hypothyroidism, unspecified: Secondary | ICD-10-CM | POA: Diagnosis present

## 2012-06-05 LAB — CBC WITH DIFFERENTIAL/PLATELET
Eosinophils Relative: 5 % (ref 0–5)
Lymphocytes Relative: 15 % (ref 12–46)
Lymphs Abs: 1 10*3/uL (ref 0.7–4.0)
MCV: 76.1 fL — ABNORMAL LOW (ref 78.0–100.0)
Neutro Abs: 4.5 10*3/uL (ref 1.7–7.7)
Platelets: 159 10*3/uL (ref 150–400)
RBC: 5.93 MIL/uL — ABNORMAL HIGH (ref 4.22–5.81)
WBC: 6.5 10*3/uL (ref 4.0–10.5)

## 2012-06-05 LAB — COMPREHENSIVE METABOLIC PANEL
ALT: 23 U/L (ref 0–53)
AST: 16 U/L (ref 0–37)
Alkaline Phosphatase: 68 U/L (ref 39–117)
CO2: 26 mEq/L (ref 19–32)
Chloride: 99 mEq/L (ref 96–112)
GFR calc Af Amer: 70 mL/min — ABNORMAL LOW (ref >90)
GFR calc non Af Amer: 60 mL/min — ABNORMAL LOW (ref >90)
Glucose, Bld: 188 mg/dL — ABNORMAL HIGH (ref 70–99)
Potassium: 3.8 mEq/L (ref 3.5–5.1)
Sodium: 135 mEq/L (ref 135–145)
Total Bilirubin: 1.5 mg/dL — ABNORMAL HIGH (ref 0.3–1.2)

## 2012-06-05 LAB — GLUCOSE, CAPILLARY: Glucose-Capillary: 188 mg/dL — ABNORMAL HIGH (ref 70–99)

## 2012-06-05 MED ORDER — HEPARIN (PORCINE) IN NACL 100-0.45 UNIT/ML-% IJ SOLN
1000.0000 [IU]/h | INTRAMUSCULAR | Status: DC
  Administered 2012-06-05 – 2012-06-06 (×2): 1000 [IU]/h via INTRAVENOUS
  Filled 2012-06-05 (×3): qty 250

## 2012-06-05 MED ORDER — LEVOTHYROXINE SODIUM 88 MCG PO TABS
88.0000 ug | ORAL_TABLET | Freq: Every day | ORAL | Status: DC
  Administered 2012-06-06: 88 ug via ORAL
  Filled 2012-06-05 (×2): qty 1

## 2012-06-05 MED ORDER — DIAZEPAM 2 MG PO TABS
2.0000 mg | ORAL_TABLET | ORAL | Status: AC
  Administered 2012-06-06: 2 mg via ORAL
  Filled 2012-06-05: qty 1

## 2012-06-05 MED ORDER — NITROGLYCERIN IN D5W 200-5 MCG/ML-% IV SOLN: 3.0000 ug/min | INTRAVENOUS | Status: DC

## 2012-06-05 MED ORDER — ASPIRIN 81 MG PO CHEW
324.0000 mg | CHEWABLE_TABLET | ORAL | Status: AC
  Administered 2012-06-05: 243 mg via ORAL
  Filled 2012-06-05: qty 3

## 2012-06-05 MED ORDER — SODIUM CHLORIDE 0.9 % IJ SOLN: 3.0000 mL | INTRAMUSCULAR | Status: DC | PRN

## 2012-06-05 MED ORDER — IRBESARTAN 75 MG PO TABS
75.0000 mg | ORAL_TABLET | Freq: Every day | ORAL | Status: DC
  Administered 2012-06-06: 75 mg via ORAL
  Filled 2012-06-05: qty 1

## 2012-06-05 MED ORDER — SODIUM CHLORIDE 0.9 % IJ SOLN: 3.0000 mL | Freq: Two times a day (BID) | INTRAMUSCULAR | Status: DC

## 2012-06-05 MED ORDER — SODIUM CHLORIDE 0.9 % IJ SOLN
3.0000 mL | Freq: Two times a day (BID) | INTRAMUSCULAR | Status: DC
  Administered 2012-06-06: 3 mL via INTRAVENOUS

## 2012-06-05 MED ORDER — INSULIN GLARGINE 100 UNIT/ML ~~LOC~~ SOLN
10.0000 [IU] | Freq: Every day | SUBCUTANEOUS | Status: DC
  Administered 2012-06-05: 10 [IU] via SUBCUTANEOUS

## 2012-06-05 MED ORDER — NITROGLYCERIN 0.4 MG SL SUBL: 0.4000 mg | SUBLINGUAL_TABLET | SUBLINGUAL | Status: DC | PRN

## 2012-06-05 MED ORDER — ONDANSETRON HCL 4 MG/2ML IJ SOLN: 4.0000 mg | Freq: Four times a day (QID) | INTRAMUSCULAR | Status: DC | PRN

## 2012-06-05 MED ORDER — ASPIRIN 300 MG RE SUPP: 300.0000 mg | RECTAL | Status: AC

## 2012-06-05 MED ORDER — SODIUM CHLORIDE 0.9 % IV SOLN
1.0000 mL/kg/h | INTRAVENOUS | Status: DC
  Administered 2012-06-06: 1 mL/kg/h via INTRAVENOUS

## 2012-06-05 MED ORDER — SODIUM CHLORIDE 0.9 % IV SOLN: 250.0000 mL | INTRAVENOUS | Status: DC | PRN

## 2012-06-05 MED ORDER — INSULIN ASPART 100 UNIT/ML ~~LOC~~ SOLN
0.0000 [IU] | SUBCUTANEOUS | Status: DC
  Administered 2012-06-06 (×3): 2 [IU] via SUBCUTANEOUS
  Administered 2012-06-06: 1 [IU] via SUBCUTANEOUS

## 2012-06-05 MED ORDER — HEPARIN BOLUS VIA INFUSION
4000.0000 [IU] | Freq: Once | INTRAVENOUS | Status: AC
  Administered 2012-06-05: 4000 [IU] via INTRAVENOUS

## 2012-06-05 MED ORDER — ASPIRIN 81 MG PO CHEW
324.0000 mg | CHEWABLE_TABLET | ORAL | Status: AC
  Administered 2012-06-06: 324 mg via ORAL
  Filled 2012-06-05: qty 4

## 2012-06-05 MED ORDER — FAMOTIDINE 20 MG PO TABS
20.0000 mg | ORAL_TABLET | Freq: Every day | ORAL | Status: DC
  Administered 2012-06-06: 20 mg via ORAL
  Filled 2012-06-05: qty 1

## 2012-06-05 MED ORDER — ATORVASTATIN CALCIUM 40 MG PO TABS
40.0000 mg | ORAL_TABLET | Freq: Every day | ORAL | Status: DC
  Filled 2012-06-05: qty 1

## 2012-06-05 MED ORDER — NITROGLYCERIN IN D5W 200-5 MCG/ML-% IV SOLN
2.0000 ug/min | Freq: Once | INTRAVENOUS | Status: AC
  Administered 2012-06-05: 5 ug/min via INTRAVENOUS
  Filled 2012-06-05: qty 250

## 2012-06-05 MED ORDER — HYDROCHLOROTHIAZIDE 25 MG PO TABS
25.0000 mg | ORAL_TABLET | Freq: Every day | ORAL | Status: DC
  Filled 2012-06-05: qty 1

## 2012-06-05 MED ORDER — ASPIRIN EC 81 MG PO TBEC: 81.0000 mg | DELAYED_RELEASE_TABLET | Freq: Every day | ORAL | Status: DC

## 2012-06-05 MED ORDER — METOPROLOL TARTRATE 25 MG PO TABS
25.0000 mg | ORAL_TABLET | Freq: Two times a day (BID) | ORAL | Status: DC
  Administered 2012-06-05 – 2012-06-06 (×2): 25 mg via ORAL
  Filled 2012-06-05 (×3): qty 1

## 2012-06-05 MED ORDER — ACETAMINOPHEN 325 MG PO TABS
650.0000 mg | ORAL_TABLET | ORAL | Status: DC | PRN
  Filled 2012-06-05: qty 2

## 2012-06-05 MED ORDER — CLOPIDOGREL BISULFATE 75 MG PO TABS
75.0000 mg | ORAL_TABLET | Freq: Every day | ORAL | Status: DC
  Administered 2012-06-06: 75 mg via ORAL
  Filled 2012-06-05: qty 1

## 2012-06-05 NOTE — Progress Notes (Signed)
ANTICOAGULATION CONSULT NOTE - Initial Consult  Pharmacy Consult for Heparin Indication: chest pain/ACS  Allergies  Allergen Reactions  . Codeine     Makes pt Sick    Patient Measurements: Height: 5\' 6"  (167.6 cm) Weight: 184 lb (83.462 kg) IBW/kg (Calculated) : 63.8  Heparin Dosing Weight: 80.8 kg  Vital Signs: Temp: 98.2 F (36.8 C) (11/06 1134) BP: 114/70 mmHg (11/06 1300) Pulse Rate: 65  (11/06 1300)  Labs:  Basename 06/05/12 1228  HGB 15.2  HCT 45.1  PLT 159  APTT --  LABPROT --  INR --  HEPARINUNFRC --  CREATININE 1.15  CKTOTAL --  CKMB --  TROPONINI --    Estimated Creatinine Clearance: 56.3 ml/min (by C-G formula based on Cr of 1.15).   Medical History: Past Medical History  Diagnosis Date  . Coronary artery disease     Prior inferior MI with remote CABG; Had early failure of his vein graft to the RCA and has had multiple PCIs to the native RCA.   . MI (myocardial infarction)     STATUS POST PTC AND STENTING OF HIS MID RCA  . Hypertension   . Hypothyroidism   . Hyperlipidemia   . Chest pain     HEAVINESS  . SOB (shortness of breath) on exertion   . Personal history of tobacco use     REMOTE IN THE PAST  . Diabetes mellitus   . Dyslipidemia   . Osteoarthritis     End-stage osteoarthritis, right knee, medial compartment    Assessment: 77 y.o M who presented to the [facility] on 11/6 with CP. The patient has significant cardiac PMH including recent cath in March '13 with a successful balloon angioplasty of the distal RCA for in-stent restenosis. Initial troponin negative this admission. Pharmacy was consulted to start heparin for ACS sx while awaiting further cardiac evaluation. Baseline Hgb/Hct/Plt wnl. Hep wt~80.8 kg  Goal of Therapy:  Heparin level 0.3-0.7 units/ml Monitor platelets by anticoagulation protocol: Yes   Plan:  1. Heparin 4000 unit bolus x 1 2. Initiate heparin drip at rate of 1000 units/hr 3. Daily heparin levels, CBC 4. Will  continue to monitor for any signs/symptoms of bleeding and will follow up with heparin level in 8 hours   Georgina Pillion, PharmD, BCPS Clinical Pharmacist Pager: [phone] 06/05/2012 2:12 PM

## 2012-06-05 NOTE — ED Notes (Signed)
Called flow manager to check on pt's admit status, bed request will be placed now

## 2012-06-05 NOTE — Telephone Encounter (Signed)
New problem:   Wife calling C/O chest pain, sob.  Nitro table taken this week.

## 2012-06-05 NOTE — ED Provider Notes (Signed)
History     CSN: [id number]  Arrival date & time 06/05/12  1126   First MD Initiated Contact with Patient 06/05/12 1206      Chief Complaint  Patient presents with  . Chest Pain    (Consider location/radiation/quality/duration/timing/severity/associated sxs/prior treatment) Patient is a 77 y.o. male presenting with chest pain. The history is provided by the patient.  Chest Pain Pertinent negatives for primary symptoms include no shortness of breath, no abdominal pain, no nausea and no vomiting.  Pertinent negatives for associated symptoms include no numbness and no weakness.    patient has had chest pain like his typical angina. It began on 4 days ago and was severe. He's been having episodes of it since, however not as severe Saturday. His pressure goes into his back. She's had some shortness of breath without cough. He states his little weak. No fevers. He does have episodes of headache also. No numbness or weakness. He is currently pain-free now. He states he took a lot of nitroglycerin. He has had a previous CABG and 4 stents since. Patient states that he feels like he had a heart attack on Saturday.  Past Medical History  Diagnosis Date  . Coronary artery disease     Prior inferior MI with remote CABG; Had early failure of his vein graft to the RCA and has had multiple PCIs to the native RCA.   . MI (myocardial infarction)     STATUS POST PTC AND STENTING OF HIS MID RCA  . Hypertension   . Hypothyroidism   . Hyperlipidemia   . Chest pain     HEAVINESS  . SOB (shortness of breath) on exertion   . Personal history of tobacco use     REMOTE IN THE PAST  . Diabetes mellitus   . Dyslipidemia   . Osteoarthritis     End-stage osteoarthritis, right knee, medial compartment    Past Surgical History  Procedure Date  . Coronary artery bypass graft   . Cardiac catheterization   . Knee arthroscopy 1999 and 2004  . Hernia repair 1985    Family History  Problem Relation Age  of Onset  . Hypertension Mother   . Cancer Sister     Throat Cancer  . Heart disease Brother   . Pneumonia Brother   . Heart disease Brother   . Cancer Brother     Prostate Cancer    History  Substance Use Topics  . Smoking status: Former Smoker    Quit date: 08/01/1975  . Smokeless tobacco: Not on file  . Alcohol Use: No      Review of Systems  Constitutional: Negative for activity change and appetite change.  HENT: Negative for neck stiffness.   Eyes: Negative for pain.  Respiratory: Negative for chest tightness and shortness of breath.   Cardiovascular: Positive for chest pain. Negative for leg swelling.  Gastrointestinal: Negative for nausea, vomiting, abdominal pain and diarrhea.  Genitourinary: Negative for flank pain.  Musculoskeletal: Negative for back pain.  Skin: Negative for rash.  Neurological: Negative for weakness, numbness and headaches.  Psychiatric/Behavioral: Negative for behavioral problems.    Allergies  Codeine  Home Medications   Current Outpatient Rx  Name  Route  Sig  Dispense  Refill  . ASPIRIN 81 MG PO TABS   Oral   Take 1 tablet (81 mg total) by mouth daily.         Marland Kitchen CLOPIDOGREL BISULFATE 75 MG PO TABS   Oral  Take 1 tablet (75 mg total) by mouth daily.   30 tablet   5   . FAMOTIDINE 20 MG PO TABS   Oral   Take 1 tablet (20 mg total) by mouth daily.   30 tablet      . GLIMEPIRIDE 2 MG PO TABS   Oral   Take 2 mg by mouth daily before breakfast.           . HYDROCHLOROTHIAZIDE 25 MG PO TABS      TAKE 1 TABLET (25 MG TOTAL) BY MOUTH DAILY.   30 tablet   5   . LEVOTHYROXINE SODIUM 88 MCG PO TABS   Oral   Take 88 mcg by mouth daily.           Marland Kitchen METOPROLOL TARTRATE 25 MG PO TABS   Oral   Take 1 tablet (25 mg total) by mouth 2 (two) times daily.   60 tablet   5   . NITROGLYCERIN 0.4 MG SL SUBL   Sublingual   Place 1 tablet (0.4 mg total) under the tongue every 5 (five) minutes as needed. For chest pain.   25  tablet   6     Please give him 4 bottles   . ROSUVASTATIN CALCIUM 20 MG PO TABS   Oral   Take 20 mg by mouth at bedtime.         Marland Kitchen VALSARTAN 80 MG PO TABS   Oral   Take 80 mg by mouth daily.           BP 114/70  Pulse 65  Temp 98.2 F (36.8 C)  Resp 18  SpO2 96%  Physical Exam  Nursing note and vitals reviewed. Constitutional: He is oriented to person, place, and time. He appears well-developed and well-nourished.  HENT:  Head: Normocephalic and atraumatic.  Eyes: EOM are normal. Pupils are equal, round, and reactive to light.  Neck: Normal range of motion. Neck supple.  Cardiovascular: Normal rate, regular rhythm and normal heart sounds.   No murmur heard. Pulmonary/Chest: Effort normal and breath sounds normal.  Abdominal: Soft. Bowel sounds are normal. He exhibits no distension and no mass. There is no tenderness. There is no rebound and no guarding.  Musculoskeletal: Normal range of motion. He exhibits no edema.  Neurological: He is alert and oriented to person, place, and time. No cranial nerve deficit.  Skin: Skin is warm and dry.  Psychiatric: He has a normal mood and affect.    ED Course  Procedures (including critical care time)  Labs Reviewed  CBC WITH DIFFERENTIAL - Abnormal; Notable for the following:    RBC 5.93 (*)     MCV 76.1 (*)     MCH 25.6 (*)     All other components within normal limits  COMPREHENSIVE METABOLIC PANEL - Abnormal; Notable for the following:    Glucose, Bld 188 (*)     Albumin 3.2 (*)     Total Bilirubin 1.5 (*)     GFR calc non Af Amer 60 (*)     GFR calc Af Amer 70 (*)     All other components within normal limits  POCT I-STAT TROPONIN I   Dg Chest 2 View  06/05/2012  *RADIOLOGY REPORT*  Clinical Data: Chest pain, shortness of breath, history diabetes, hypertension, coronary artery disease post MI  CHEST - 2 VIEW  Comparison: 10/08/2011  Findings: Enlargement of cardiac silhouette post CABG. Mediastinal contours and  pulmonary vascularity normal. Chronic elevation right  diaphragm with minimal right basilar atelectasis. No acute failure or consolidation. No pleural effusion or pneumothorax. Bones unremarkable.  IMPRESSION: Enlargement of cardiac silhouette post CABG. Minimal chronic right basilar atelectasis. No acute abnormalities.   Original Report Authenticated By: Ulyses Southward, M.D.      1. Unstable angina      Date: 06/05/2012  Rate: 67  Rhythm: normal sinus rhythm  QRS Axis: normal  Intervals: normal  ST/T Wave abnormalities: nonspecific ST/T changes  Conduction Disutrbances:none  Narrative Interpretation:   Old EKG Reviewed: unchanged  CRITICAL CARE Performed by: Billee Cashing   Total critical care time: 30  Critical care time was exclusive of separately billable procedures and treating other patients.  Critical care was necessary to treat or prevent imminent or life-threatening deterioration.  Critical care was time spent personally by me on the following activities: development of treatment plan with patient and/or surrogate as well as nursing, discussions with consultants, evaluation of patient's response to treatment, examination of patient, obtaining history from patient or surrogate, ordering and performing treatments and interventions, ordering and review of laboratory studies, ordering and review of radiographic studies, pulse oximetry and re-evaluation of patient's condition.   MDM  Patient with episodes of chest pain since Saturday. States it feels like his previous angina. EKG is stable from previous. Initial troponin is negative. Patient be treated as unstable angina will be admitted to cardiology. Patient states the pain is gone however does still have some pressure. Heparin and nitroglycerin were started.        Juliet Rude. Rubin Payor, MD 06/05/12 1344

## 2012-06-05 NOTE — Telephone Encounter (Signed)
Pt's wife states that pt. has been having CP since Sunday with nausea and sob and is having active CP at this time. Pt. Is advised to call 911 or have someone drive him to ER. Pt's wife agreed and states they are going to ER asap. Daun Peacock aware.

## 2012-06-05 NOTE — H&P (Signed)
CARDIOLOGY ADMISSION HISTORY & PHYSICAL  Patient ID: Benjamin Harrison MRN: [id number], DOB/AGE: 1935-02-05   Date of Admission: 06/05/2012  Primary Physician: Benjamin Mask, MD Primary Cardiologist: Benjamin Harrison Reason for Admission:chest pain  History of Present Illness Patient is 77 year old man with PMH of CAD/MI/multiple stents to native RCA, remote CABG, DM, HTN, HLD, Remote Tobacco abuse who presented to the ED for chest pain. Patient reports that he started to have severe chest pain/pressure like his typical angina 4 days ago. His chest pain radiating to his back. Accompanied with SOB at rest, weakness, and headache. No cough, heart burn, nausea, vomting, abdominal pain, numbness or weakness.  Patient feels like he had a heart attack on Saturday.  He reports that he's been having episodes of it since, however not as severe Saturday.  He states he took a lot of nitroglycerin with complete resolution. He presented to ED for evaluation. Troponin negative x 1. Unremarkable EKG.   Last cath in 10/09/11 for eval of NSTEMI shows 1. Severe 3 vessel obstructive coronary disease. There is severe in-stent restenosis in the distal right coronary.  2. Patent saphenous vein graft to the diagonal.  3. Patent saphenous vein graft to the obtuse marginal vessel.  4. Occluded saphenous vein graft to the right coronary.  5. Patent LIMA graft to the LAD.  6. Normal left ventricular function.  7. Successful balloon angioplasty of the distal right coronary for in-stent restenosis   Past Medical History  Diagnosis Date  . Coronary artery disease     Prior inferior MI with remote CABG; Had early failure of his vein graft to the RCA and has had multiple PCIs to the native RCA.   . MI (myocardial infarction)     STATUS POST PTC AND STENTING OF HIS MID RCA  . Hypertension   . Hypothyroidism   . Hyperlipidemia   . Chest pain     HEAVINESS  . SOB (shortness of breath) on exertion   . Personal  history of tobacco use     REMOTE IN THE PAST  . Diabetes mellitus   . Dyslipidemia   . Osteoarthritis     End-stage osteoarthritis, right knee, medial compartment    Past Surgical History  Procedure Date  . Coronary artery bypass graft   . Cardiac catheterization   . Knee arthroscopy 1999 and 2004  . Hernia repair 1985     Allergies/Intolerances Allergies  Allergen Reactions  . Codeine     Makes pt Sick    Home Medications   Medication List     As of 06/05/2012  1:51 PM    ASK your doctor about these medications         aspirin 81 MG tablet   Take 1 tablet (81 mg total) by mouth daily.      clopidogrel 75 MG tablet   Commonly known as: PLAVIX   Take 1 tablet (75 mg total) by mouth daily.      glimepiride 2 MG tablet   Commonly known as: AMARYL   Take 2 mg by mouth daily before breakfast.      hydrochlorothiazide 25 MG tablet   Commonly known as: HYDRODIURIL   TAKE 1 TABLET (25 MG TOTAL) BY MOUTH DAILY.      levothyroxine 88 MCG tablet   Commonly known as: SYNTHROID, LEVOTHROID   Take 88 mcg by mouth daily.      metoprolol tartrate 25 MG tablet   Commonly known as: LOPRESSOR  Take 1 tablet (25 mg total) by mouth 2 (two) times daily.      nitroGLYCERIN 0.4 MG SL tablet   Commonly known as: NITROSTAT   Place 1 tablet (0.4 mg total) under the tongue every 5 (five) minutes as needed. For chest pain.      PEPCID 20 MG tablet   Generic drug: famotidine   Take 1 tablet (20 mg total) by mouth daily.      rosuvastatin 20 MG tablet   Commonly known as: CRESTOR   Take 20 mg by mouth at bedtime.      valsartan 80 MG tablet   Commonly known as: DIOVAN   Take 80 mg by mouth daily.          Family History Family History  Problem Relation Age of Onset  . Hypertension Mother   . Cancer Sister     Throat Cancer  . Heart disease Brother   . Pneumonia Brother   . Heart disease Brother   . Cancer Brother     Prostate Cancer     Social History History    Social History  . Marital Status: Married    Spouse Name: N/A    Number of Children: N/A  . Years of Education: N/A   Occupational History  . Not on file.   Social History Main Topics  . Smoking status: Former Smoker    Quit date: 08/01/1975  . Smokeless tobacco: Not on file  . Alcohol Use: No  . Drug Use: No  . Sexually Active: No   Other Topics Concern  . Not on file   Social History Narrative  . No narrative on file     Review of Systems General:  No chills, fever, night sweats or weight changes.  Cardiovascular:  Positive chest pain, dyspnea, negative edema, orthopnea, palpitations, paroxysmal nocturnal dyspnea. Dermatological: No rash, lesions or masses. Respiratory: No cough, dyspnea. Urologic: No hematuria, dysuria. Abdominal:   No nausea, vomiting, diarrhea, bright red blood per rectum, melena, or hematemesis. Neurologic:  No visual changes, weakness, changes in mental status. All other systems reviewed and are otherwise negative except as noted above.  Physical Exam Blood pressure 137/78, pulse 68, temperature 98.2 F (36.8 C), resp. rate 18, SpO2 97.00%.  General: mild distress due chest pressure HEENT: Normocephalic, atraumatic. EOMs intact. Sclera nonicteric. Oropharynx clear.  Neck: Supple without bruits. Unable to appreciate JVD due to body habitus Lungs:  Respirations regular and unlabored, CTA bilaterally. Heart: RRR. S1, S2 present. No murmurs, rub, S3 or S4. Abdomen: Soft, non-tender, non-distended. BS present x 4 quadrants. No hepatosplenomegaly.  Extremities: No clubbing, cyanosis or edema. DP/PT/Radials 2+ and equal bilaterally. Psych: Normal affect. Neuro: Alert and oriented X 3. Moves all extremities spontaneously. Musculoskeletal: No kyphosis. Skin: Intact. Warm and dry. No rashes or petechiae in exposed areas.  Results for Benjamin Harrison (MRN [id number]) as of 06/05/2012 14:00  Ref. Range 06/05/2012 12:12  Troponin i, poc Latest Range:  0.00-0.08 ng/mL 0.01   Results for Benjamin Harrison (MRN [id number]) as of 06/05/2012 14:00  Ref. Range 06/05/2012 12:28  Sodium Latest Range: 135-145 mEq/L 135  Potassium Latest Range: 3.5-5.1 mEq/L 3.8  Chloride Latest Range: 96-112 mEq/L 99  CO2 Latest Range: 19-32 mEq/L 26  BUN Latest Range: 6-23 mg/dL 16  Creatinine Latest Range: 0.50-1.35 mg/dL 7.82  Calcium Latest Range: 8.4-10.5 mg/dL 9.2  GFR calc non Af Amer Latest Range: >90 mL/min 60 (L)  GFR calc Af Amer Latest Range: >90  mL/min 70 (L)  Glucose Latest Range: 70-99 mg/dL 161 (H)  Alkaline Phosphatase Latest Range: 39-117 U/L 68  Albumin Latest Range: 3.5-5.2 g/dL 3.2 (L)  AST Latest Range: 0-37 U/L 16  ALT Latest Range: 0-53 U/L 23  Total Protein Latest Range: 6.0-8.3 g/dL 6.7  Total Bilirubin Latest Range: 0.3-1.2 mg/dL 1.5 (H)   Results for LILLIAN, TIGGES (MRN [id number]) as of 06/05/2012 14:00  Ref. Range 06/05/2012 12:28  WBC Latest Range: 4.0-10.5 K/uL 6.5  RBC Latest Range: 4.22-5.81 MIL/uL 5.93 (H)  Hemoglobin Latest Range: 13.0-17.0 g/dL 81.1  HCT Latest Range: 39.0-52.0 % 45.1  MCV Latest Range: 78.0-100.0 fL 76.1 (L)  MCH Latest Range: 26.0-34.0 pg 25.6 (L)  MCHC Latest Range: 30.0-36.0 g/dL 91.4  RDW Latest Range: 11.5-15.5 % 14.9  Platelets Latest Range: 150-400 K/uL 159  Neutrophils Relative Latest Range: 43-77 % 69  Lymphocytes Relative Latest Range: 12-46 % 15  Monocytes Relative Latest Range: 3-12 % 12  Eosinophils Relative Latest Range: 0-5 % 5  Basophils Relative Latest Range: 0-1 % 0  NEUT# Latest Range: 1.7-7.7 K/uL 4.5  Lymphocytes Absolute Latest Range: 0.7-4.0 K/uL 1.0  Monocytes Absolute Latest Range: 0.1-1.0 K/uL 0.8  Eosinophils Absolute Latest Range: 0.0-0.7 K/uL 0.3  Basophils Absolute Latest Range: 0.0-0.1 K/uL 0.0    Radiology/Studies Dg Chest 2 View  06/05/2012  *RADIOLOGY REPORT*  Clinical Data: Chest pain, shortness of breath, history diabetes, hypertension, coronary artery  disease post MI  CHEST - 2 VIEW  Comparison: 10/08/2011  Findings: Enlargement of cardiac silhouette post CABG. Mediastinal contours and pulmonary vascularity normal. Chronic elevation right diaphragm with minimal right basilar atelectasis. No acute failure or consolidation. No pleural effusion or pneumothorax. Bones unremarkable.  IMPRESSION: Enlargement of cardiac silhouette post CABG. Minimal chronic right basilar atelectasis. No acute abnormalities.   Original Report Authenticated By: Ulyses Southward, M.D.      Echocardiogram 01/29/12 EF 50-55%, grade 1 diastolic dysfunction   12-lead ECG: NSR, no acute ST and T abnormality  Telemetry NSR  Signed, LI, NA, MD 06/05/2012, 1:04 PM  Patient seen and examined with Dr. Dierdre Searles. Agree with assessment as above. See below for my thoughts.   Assessment 1. [place] 2. CAD s/p CABG and multiple PCIs most recently 3/13 3. DM2 4. HTN 5. HL  PLAN/DISCUSSION:  CP concerning for unstable angina. CE negative so far. ECG non-acute. Treat with asa, statin, heparin, plavix and IV NTG. Plan cath today or tomorrow. Discussed with Dr. Elease Hashimoto who knows him well.   Arvilla Meres, MD

## 2012-06-05 NOTE — ED Notes (Signed)
Pt reports CP at 1/10, sts some of the "heaviness is leaving my chest"

## 2012-06-05 NOTE — ED Notes (Signed)
Heart Healthy diet ordered spoke with Jacqueline 

## 2012-06-05 NOTE — ED Notes (Signed)
Cardiology to bedside. 

## 2012-06-05 NOTE — ED Notes (Signed)
Per pt chest pain since Saturday morning. sts pain is intermittent and associated with SOB. sts radiating sown left arm. Denies pain currently. sts SOB and weak.

## 2012-06-06 ENCOUNTER — Encounter (HOSPITAL_COMMUNITY)
Admission: EM | Disposition: A | Discharge status: Home / Self Care | Payer: Self-pay | Source: Home / Self Care | Attending: Internal Medicine

## 2012-06-06 DIAGNOSIS — I2510 Atherosclerotic heart disease of native coronary artery without angina pectoris: Secondary | ICD-10-CM

## 2012-06-06 HISTORY — PX: LEFT HEART CATHETERIZATION WITH CORONARY/GRAFT ANGIOGRAM: SHX5450

## 2012-06-06 LAB — HEMOGLOBIN A1C
Hgb A1c MFr Bld: 7.2 % — ABNORMAL HIGH (ref <5.7)
Mean Plasma Glucose: 160 mg/dL — ABNORMAL HIGH (ref <117)

## 2012-06-06 LAB — GLUCOSE, CAPILLARY
Glucose-Capillary: 127 mg/dL — ABNORMAL HIGH (ref 70–99)
Glucose-Capillary: 198 mg/dL — ABNORMAL HIGH (ref 70–99)

## 2012-06-06 LAB — POCT ACTIVATED CLOTTING TIME: Activated Clotting Time: 154 seconds

## 2012-06-06 LAB — BASIC METABOLIC PANEL
BUN: 17 mg/dL (ref 6–23)
CO2: 27 mEq/L (ref 19–32)
Calcium: 8.9 mg/dL (ref 8.4–10.5)
Chloride: 101 mEq/L (ref 96–112)
Creatinine, Ser: 1.12 mg/dL (ref 0.50–1.35)
GFR calc Af Amer: 72 mL/min — ABNORMAL LOW (ref >90)
GFR calc non Af Amer: 62 mL/min — ABNORMAL LOW (ref >90)
Glucose, Bld: 207 mg/dL — ABNORMAL HIGH (ref 70–99)
Potassium: 3.9 mEq/L (ref 3.5–5.1)
Sodium: 137 mEq/L (ref 135–145)

## 2012-06-06 LAB — PROTIME-INR: INR: 1.05 (ref 0.00–1.49)

## 2012-06-06 LAB — CBC
HCT: 39.1 % (ref 39.0–52.0)
MCHC: 33.8 g/dL (ref 30.0–36.0)
MCV: 75.9 fL — ABNORMAL LOW (ref 78.0–100.0)
RDW: 14.8 % (ref 11.5–15.5)

## 2012-06-06 LAB — TROPONIN I
Troponin I: <0.3 ng/mL (ref <0.30)
Troponin I: <0.3 ng/mL (ref <0.30)

## 2012-06-06 LAB — HEPARIN LEVEL (UNFRACTIONATED): Heparin Unfractionated: 0.38 IU/mL (ref 0.30–0.70)

## 2012-06-06 SURGERY — LEFT HEART CATHETERIZATION WITH CORONARY/GRAFT ANGIOGRAM
Anesthesia: LOCAL

## 2012-06-06 MED ORDER — DIAZEPAM 5 MG PO TABS: 5.0000 mg | ORAL_TABLET | ORAL | Status: DC

## 2012-06-06 MED ORDER — NITROGLYCERIN 0.2 MG/ML ON CALL CATH LAB
INTRAVENOUS | Status: AC
  Filled 2012-06-06: qty 1

## 2012-06-06 MED ORDER — MIDAZOLAM HCL 2 MG/2ML IJ SOLN
INTRAMUSCULAR | Status: AC
  Filled 2012-06-06: qty 2

## 2012-06-06 MED ORDER — SODIUM CHLORIDE 0.9 % IV SOLN: INTRAVENOUS | Status: DC

## 2012-06-06 MED ORDER — ONDANSETRON HCL 4 MG/2ML IJ SOLN: 4.0000 mg | Freq: Four times a day (QID) | INTRAMUSCULAR | Status: DC | PRN

## 2012-06-06 MED ORDER — SODIUM CHLORIDE 0.9 % IV SOLN: INTRAVENOUS | Status: AC

## 2012-06-06 MED ORDER — HEPARIN (PORCINE) IN NACL 2-0.9 UNIT/ML-% IJ SOLN
INTRAMUSCULAR | Status: AC
  Filled 2012-06-06: qty 1000

## 2012-06-06 MED ORDER — ACETAMINOPHEN 325 MG PO TABS: 650.0000 mg | ORAL_TABLET | ORAL | Status: DC | PRN

## 2012-06-06 MED ORDER — LIDOCAINE HCL (PF) 1 % IJ SOLN
INTRAMUSCULAR | Status: AC
  Filled 2012-06-06: qty 30

## 2012-06-06 MED ORDER — FENTANYL CITRATE 0.05 MG/ML IJ SOLN
INTRAMUSCULAR | Status: AC
  Filled 2012-06-06: qty 2

## 2012-06-06 MED ORDER — ASPIRIN 81 MG PO CHEW: 324.0000 mg | CHEWABLE_TABLET | ORAL | Status: DC

## 2012-06-06 MED ORDER — SODIUM CHLORIDE 0.9 % IV SOLN: 1.0000 mL/kg/h | INTRAVENOUS | Status: DC

## 2012-06-06 NOTE — CV Procedure (Signed)
    Cardiac Cath Note  GABERIEL YOUNGBLOOD [id number] 14-Aug-1934  Procedure: Left  Heart Cardiac Catheterization Note Indications: unstable angina  Procedure Details Consent: Obtained Time Out: Verified patient identification, verified procedure, site/side was marked, verified correct patient position, special equipment/implants available, Radiology Safety Procedures followed,  medications/allergies/relevent history reviewed, required imaging and test results available.  Performed   Medications: Fentanyl: 50 mcg IV Versed: 2 mg IV  The right femoral artery was easily canulated using a modified Seldinger technique.  Hemodynamics:    LV pressure: 101/102 Aortic pressure: 98/58  Angiography   Left Main: mild irregularities  Proximal irregularities 20-30%  Left anterior Descending: occluded proximally  Left Circumflex: mild irregularities.  OM 1 is normal. OM 2 has minor luminal irregularities.  There is retrograde filling of the OM graft  Right Coronary Artery: mild - moderate diffuse disease. The proximal stent is patent.   The distal stented segment is a long segment and is likely 2 adjoining stents.  There is a 50% stenosis in the   The flow appears to be good through this stent.  There are moderate irregularities in the PLSA.  The inferior IV septum appears to be supplied by the large RV marginal.    SVG to diag:  Normal graft, anastomosis is normal.   The distal Diag is normal.  There is severe disease in the proximal D1 prior to the insertion of the graft.  This vessel is supplied in a retrograde fashion from the graft and may be the source of angina chest pain.  SVG to RCA:  Occluded proximally.  SVG to OM: large graft, normal. Anastomosis is normal.  LIMA to LAD:  normal  LV Gram: Normal LV systolic function.  Complications: No apparent complications Patient did tolerate procedure well.  Contrast used: 70 cc  Conclusions:   1. Severe native CAD. 2. Patent  grafts to the LAD, D1, OM1.  The graft to the distal RCA is occluded. 3. Normal LV function.   Vesta Mixer, Montez Hageman., MD, [facility] 06/06/2012, 4:34 PM Office - [phone] Pager [phone]

## 2012-06-06 NOTE — Progress Notes (Signed)
ANTICOAGULATION CONSULT NOTE - Follow-Up Consult  Pharmacy Consult for Heparin Indication: chest pain/ACS  Allergies  Allergen Reactions  . Codeine     Makes pt Sick    Patient Measurements: Height: 5\' 6"  (167.6 cm) Weight: 390 lb 3.4 oz (177 kg) IBW/kg (Calculated) : 63.8  Heparin Dosing Weight: 80.8 kg  Vital Signs: Temp: 97.9 F (36.6 C) (11/07 0000) Temp src: Oral (11/07 0000) BP: 123/23 mmHg (11/07 0000) Pulse Rate: 87  (11/07 0000)  Labs:  Basename 06/05/12 2310 06/05/12 2017 06/05/12 1228  HGB -- -- 15.2  HCT -- -- 45.1  PLT -- -- 159  APTT -- -- --  LABPROT -- -- --  INR -- -- --  HEPARINUNFRC 0.41 -- --  CREATININE -- -- 1.15  CKTOTAL -- -- --  CKMB -- -- --  TROPONINI -- <0.30 --    Estimated Creatinine Clearance: 85.6 ml/min (by C-G formula based on Cr of 1.15).  Assessment: 77 y.o M who presented to the [facility] on 11/6 with CP. The patient has significant cardiac PMH including recent cath in March '13 with a successful balloon angioplasty of the distal RCA for in-stent restenosis. Pt on heparin gtt for ACS sx while awaiting further cardiac evaluation. Heparin level 0.41 (therapeutic). No bleeding noted.  Goal of Therapy:  Heparin level 0.3-0.7 units/ml Monitor platelets by anticoagulation protocol: Yes   Plan:  1. Continue heparin drip at rate of 1000 units/hr 2. Daily heparin levels, CBC  Christoper Fabian, PharmD, BCPS Clinical pharmacist, pager [phone] 06/06/2012 1:26 AM

## 2012-06-06 NOTE — Progress Notes (Signed)
To cath lab by bed, stable. 

## 2012-06-06 NOTE — Progress Notes (Signed)
PROGRESS NOTE  Subjective:   Pt is admitted with 4 days of CP - relieved with NTG.  Enzymes are negative so far  Objective:    Vital Signs:   Temp:  [97.8 F (36.6 C)-98.2 F (36.8 C)] 97.8 F (36.6 C) (11/07 0400) Pulse Rate:  [65-88] 66  (11/07 0400) Resp:  [15-22] 15  (11/07 0400) BP: (100-137)/(23-84) 111/75 mmHg (11/07 0400) SpO2:  [94 %-99 %] 97 % (11/07 0400) Weight:  [184 lb (83.462 kg)-390 lb 3.4 oz (177 kg)] 390 lb 3.4 oz (177 kg) (11/06 2000)  Last BM Date: 06/05/12   24-hour weight change: Weight change:   Weight trends: Filed Weights   06/05/12 1201 06/05/12 2000  Weight: 184 lb (83.462 kg) 390 lb 3.4 oz (177 kg)    Intake/Output:  11/06 0701 - 11/07 0700 In: 292.3 [I.V.:292.3] Out: 675 [Urine:675]     Physical Exam: BP 111/75  Pulse 66  Temp 97.8 F (36.6 C) (Oral)  Resp 15  Ht 5\' 6"  (1.676 m)  Wt 390 lb 3.4 oz (177 kg)  BMI 62.98 kg/m2  SpO2 97%  General: Vital signs reviewed and noted. Well-developed, well-nourished, in no acute distress; alert, appropriate and cooperative . pain free  Head: Normocephalic, atraumatic.  Eyes: conjunctivae/corneas clear.  EOM's intact.   Throat: normal  Neck: Supple. Normal carotids. No JVD  Lungs:  Clear to auscultation  Heart: Regular rate,  With normal  S1 S2. No murmurs, gallops or rubs  Abdomen:  Soft, non-tender, non-distended with normoactive bowel sounds. No hepatomegaly. No rebound/guarding. No abdominal masses.  Extremities: Distal pedal pulses are 2+ .  No edema.    Neurologic: A&O X3, CN II - XII are grossly intact. Motor strength is 5/5 in the all 4 extremities.  Psych: Responds to questions appropriately with normal affect.    Labs: BMET:  Basename 06/06/12 0530 06/05/12 1228  NA 137 135  K 3.9 3.8  CL 101 99  CO2 27 26  GLUCOSE 207* 188*  BUN 17 16  CREATININE 1.12 1.15  CALCIUM 8.9 9.2  MG -- --  PHOS -- --    Liver function tests:  Basename 06/05/12 1228  AST 16  ALT 23    ALKPHOS 68  BILITOT 1.5*  PROT 6.7  ALBUMIN 3.2*   No results found for this basename: LIPASE:2,AMYLASE:2 in the last 72 hours  CBC:  Basename 06/06/12 0530 06/05/12 1228  WBC 5.5 6.5  NEUTROABS -- 4.5  HGB 13.2 15.2  HCT 39.1 45.1  MCV 75.9* 76.1*  PLT 148* 159    Cardiac Enzymes:  Basename 06/06/12 0108 06/05/12 2017  CKTOTAL -- --  CKMB -- --  TROPONINI <0.30 <0.30    Coagulation Studies: No results found for this basename: LABPROT:5,INR:5 in the last 72 hours    Tele:  NSR  Medications:    Infusions:    . sodium chloride 1 mL/kg/hr (06/06/12 0430)  . heparin 1,000 Units/hr (06/05/12 1900)  . nitroGLYCERIN 5 mcg/min (06/05/12 2005)    Scheduled Medications:    . [COMPLETED] aspirin  324 mg Oral NOW   Or  . [COMPLETED] aspirin  300 mg Rectal NOW  . [COMPLETED] aspirin  324 mg Oral Pre-Cath  . aspirin EC  81 mg Oral Daily  . atorvastatin  40 mg Oral q1800  . clopidogrel  75 mg Oral Daily  . diazepam  2 mg Oral On Call  . famotidine  20 mg Oral Daily  . [COMPLETED] heparin  4,000 Units Intravenous Once  . hydrochlorothiazide  25 mg Oral Daily  . insulin aspart  0-9 Units Subcutaneous Q4H  . insulin glargine  10 Units Subcutaneous QHS  . irbesartan  75 mg Oral Daily  . levothyroxine  88 mcg Oral QAC breakfast  . metoprolol tartrate  25 mg Oral BID  . [COMPLETED] nitroGLYCERIN  2-200 mcg/min Intravenous Once  . sodium chloride  3 mL Intravenous Q12H  . sodium chloride  3 mL Intravenous Q12H    Assessment/ Plan:    Intermediate coronary syndrome (06/05/2012) Symptoms are consistent with unstable angina.  Enzymes are negative so far.  i agree with cath this am.    Hypertension Stable  Hypothyroidism  Diabetes Mellitus.  Dyslipidemia   Disposition: cath this am. Length of Stay: 1  Vesta Mixer, Montez Hageman., MD, [facility] 06/06/2012, 7:31 AM Office [phone] Pager [phone]

## 2012-06-06 NOTE — Interval H&P Note (Signed)
History and Physical Interval Note:  06/06/2012 3:54 PM  Benjamin Harrison  has presented today for surgery, with the diagnosis of cp  The various methods of treatment have been discussed with the patient and family. After consideration of risks, benefits and other options for treatment, the patient has consented to  Procedure(s) (LRB) with comments: LEFT HEART CATHETERIZATION WITH CORONARY/GRAFT ANGIOGRAM (N/A) as a surgical intervention .  The patient's history has been reviewed, patient examined, no change in status, stable for surgery.  I have reviewed the patient's chart and labs.  Questions were answered to the patient's satisfaction.     Elyn Aquas.

## 2012-06-06 NOTE — H&P (View-Only) (Signed)
 PROGRESS NOTE  Subjective:   Pt is admitted with 4 days of CP - relieved with NTG.  Enzymes are negative so far  Objective:    Vital Signs:   Temp:  [97.8 F (36.6 C)-98.2 F (36.8 C)] 97.8 F (36.6 C) (11/07 0400) Pulse Rate:  [65-88] 66  (11/07 0400) Resp:  [15-22] 15  (11/07 0400) BP: (100-137)/(23-84) 111/75 mmHg (11/07 0400) SpO2:  [94 %-99 %] 97 % (11/07 0400) Weight:  [184 lb (83.462 kg)-390 lb 3.4 oz (177 kg)] 390 lb 3.4 oz (177 kg) (11/06 2000)  Last BM Date: 06/05/12   24-hour weight change: Weight change:   Weight trends: Filed Weights   06/05/12 1201 06/05/12 2000  Weight: 184 lb (83.462 kg) 390 lb 3.4 oz (177 kg)    Intake/Output:  11/06 0701 - 11/07 0700 In: 292.3 [I.V.:292.3] Out: 675 [Urine:675]     Physical Exam: BP 111/75  Pulse 66  Temp 97.8 F (36.6 C) (Oral)  Resp 15  Ht 5' 6" (1.676 m)  Wt 390 lb 3.4 oz (177 kg)  BMI 62.98 kg/m2  SpO2 97%  General: Vital signs reviewed and noted. Well-developed, well-nourished, in no acute distress; alert, appropriate and cooperative . pain free  Head: Normocephalic, atraumatic.  Eyes: conjunctivae/corneas clear.  EOM's intact.   Throat: normal  Neck: Supple. Normal carotids. No JVD  Lungs:  Clear to auscultation  Heart: Regular rate,  With normal  S1 S2. No murmurs, gallops or rubs  Abdomen:  Soft, non-tender, non-distended with normoactive bowel sounds. No hepatomegaly. No rebound/guarding. No abdominal masses.  Extremities: Distal pedal pulses are 2+ .  No edema.    Neurologic: A&O X3, CN II - XII are grossly intact. Motor strength is 5/5 in the all 4 extremities.  Psych: Responds to questions appropriately with normal affect.    Labs: BMET:  Basename 06/06/12 0530 06/05/12 1228  NA 137 135  K 3.9 3.8  CL 101 99  CO2 27 26  GLUCOSE 207* 188*  BUN 17 16  CREATININE 1.12 1.15  CALCIUM 8.9 9.2  MG -- --  PHOS -- --    Liver function tests:  Basename 06/05/12 1228  AST 16  ALT 23    ALKPHOS 68  BILITOT 1.5*  PROT 6.7  ALBUMIN 3.2*   No results found for this basename: LIPASE:2,AMYLASE:2 in the last 72 hours  CBC:  Basename 06/06/12 0530 06/05/12 1228  WBC 5.5 6.5  NEUTROABS -- 4.5  HGB 13.2 15.2  HCT 39.1 45.1  MCV 75.9* 76.1*  PLT 148* 159    Cardiac Enzymes:  Basename 06/06/12 0108 06/05/12 2017  CKTOTAL -- --  CKMB -- --  TROPONINI <0.30 <0.30    Coagulation Studies: No results found for this basename: LABPROT:5,INR:5 in the last 72 hours    Tele:  NSR  Medications:    Infusions:    . sodium chloride 1 mL/kg/hr (06/06/12 0430)  . heparin 1,000 Units/hr (06/05/12 1900)  . nitroGLYCERIN 5 mcg/min (06/05/12 2005)    Scheduled Medications:    . [COMPLETED] aspirin  324 mg Oral NOW   Or  . [COMPLETED] aspirin  300 mg Rectal NOW  . [COMPLETED] aspirin  324 mg Oral Pre-Cath  . aspirin EC  81 mg Oral Daily  . atorvastatin  40 mg Oral q1800  . clopidogrel  75 mg Oral Daily  . diazepam  2 mg Oral On Call  . famotidine  20 mg Oral Daily  . [COMPLETED] heparin    4,000 Units Intravenous Once  . hydrochlorothiazide  25 mg Oral Daily  . insulin aspart  0-9 Units Subcutaneous Q4H  . insulin glargine  10 Units Subcutaneous QHS  . irbesartan  75 mg Oral Daily  . levothyroxine  88 mcg Oral QAC breakfast  . metoprolol tartrate  25 mg Oral BID  . [COMPLETED] nitroGLYCERIN  2-200 mcg/min Intravenous Once  . sodium chloride  3 mL Intravenous Q12H  . sodium chloride  3 mL Intravenous Q12H    Assessment/ Plan:    Intermediate coronary syndrome (06/05/2012) Symptoms are consistent with unstable angina.  Enzymes are negative so far.  i agree with cath this am.    Hypertension Stable  Hypothyroidism  Diabetes Mellitus.  Dyslipidemia   Disposition: cath this am. Length of Stay: 1  Syd Manges J. Claudio Mondry, Jr., MD, FACC 06/06/2012, 7:31 AM Office [phone] Pager [phone]    

## 2012-06-06 NOTE — Progress Notes (Signed)
ANTICOAGULATION CONSULT NOTE - Follow Up Consult  Pharmacy Consult for Heparin Indication: chest pain/ACS  Allergies  Allergen Reactions  . Codeine     Makes pt Sick    Patient Measurements: Height: 5\' 6"  (167.6 cm) Weight: 177 lb 0.5 oz (80.3 kg) IBW/kg (Calculated) : 63.8  Heparin Dosing Weight: 80.8kg  Vital Signs: Temp: 97.8 F (36.6 C) (11/07 0400) Temp src: Oral (11/07 0400) BP: 115/70 mmHg (11/07 0800) Pulse Rate: 67  (11/07 0800)  Labs:  Basename 06/06/12 0530 06/06/12 0108 06/05/12 2310 06/05/12 2017 06/05/12 1228  HGB 13.2 -- -- -- 15.2  HCT 39.1 -- -- -- 45.1  PLT 148* -- -- -- 159  APTT -- -- -- -- --  LABPROT -- -- -- -- --  INR -- -- -- -- --  HEPARINUNFRC 0.38 -- 0.41 -- --  CREATININE 1.12 -- -- -- 1.15  CKTOTAL -- -- -- -- --  CKMB -- -- -- -- --  TROPONINI -- <0.30 -- <0.30 --    Estimated Creatinine Clearance: 56.7 ml/min (by C-G formula based on Cr of 1.12).   Medications:  Heparin @ 1000 units/hr  Assessment: 77yom continues on heparin with a therapeutic heparin level. Plan is for cath this AM. Hgb is decreased from admission  (15.2-->13.2) and platelets are stable. No bleeding reported.   Goal of Therapy:  Heparin level 0.3-0.7 units/ml Monitor platelets by anticoagulation protocol: Yes   Plan:  1) Continue heparin at 1000 units/hr 2) Follow up after cath  Fredrik Rigger 06/06/2012,8:58 AM

## 2012-06-06 NOTE — Care Management Note (Signed)
    Page 1 of 1   06/06/2012     11:24:59 AM   CARE MANAGEMENT NOTE 06/06/2012  Patient:  Benjamin Harrison,Benjamin Harrison   Account Number:  [id number]  Date Initiated:  06/06/2012  Documentation initiated by:  Junius Creamer  Subjective/Objective Assessment:   adm w angina     Action/Plan:   lives w wife, pcp dr Andrey Campanile elkins   Anticipated DC Date:     Anticipated DC Plan:  HOME/SELF CARE      DC Planning Services  CM consult      Choice offered to / List presented to:             Status of service:   Medicare Important Message given?   (If response is "NO", the following Medicare IM given date fields will be blank) Date Medicare IM given:   Date Additional Medicare IM given:    Discharge Disposition:  HOME/SELF CARE  Per UR Regulation:  Reviewed for med. necessity/level of care/duration of stay  If discussed at Long Length of Stay Meetings, dates discussed:    Comments:  11/7 11:24a debbie Tarris Delbene rn,bsn [phone]

## 2012-06-06 NOTE — Progress Notes (Signed)
Back from the cath lab awake and alert, instructions given, bedrest emphasized.

## 2012-06-07 ENCOUNTER — Encounter (HOSPITAL_COMMUNITY): Payer: Self-pay | Admitting: Physician Assistant

## 2012-06-07 DIAGNOSIS — I2510 Atherosclerotic heart disease of native coronary artery without angina pectoris: Secondary | ICD-10-CM

## 2012-06-07 DIAGNOSIS — I10 Essential (primary) hypertension: Secondary | ICD-10-CM

## 2012-06-07 LAB — CBC
HCT: 38.3 % — ABNORMAL LOW (ref 39.0–52.0)
Platelets: 147 10*3/uL — ABNORMAL LOW (ref 150–400)
RBC: 4.98 MIL/uL (ref 4.22–5.81)
RDW: 15 % (ref 11.5–15.5)
WBC: 4.9 10*3/uL (ref 4.0–10.5)

## 2012-06-07 LAB — GLUCOSE, CAPILLARY
Glucose-Capillary: 183 mg/dL — ABNORMAL HIGH (ref 70–99)
Glucose-Capillary: 170 mg/dL — ABNORMAL HIGH (ref 70–99)

## 2012-06-07 MED ORDER — ISOSORBIDE MONONITRATE ER 30 MG PO TB24
30.0000 mg | ORAL_TABLET | Freq: Every day | ORAL | Status: DC
  Administered 2012-06-07 – 2012-06-08 (×2): 30 mg via ORAL
  Filled 2012-06-07 (×2): qty 1

## 2012-06-07 MED ORDER — LEVOTHYROXINE SODIUM 88 MCG PO TABS
88.0000 ug | ORAL_TABLET | Freq: Every day | ORAL | Status: DC
  Administered 2012-06-07 – 2012-06-08 (×2): 88 ug via ORAL
  Filled 2012-06-07 (×3): qty 1

## 2012-06-07 MED ORDER — NITROGLYCERIN 0.4 MG SL SUBL: 0.4000 mg | SUBLINGUAL_TABLET | SUBLINGUAL | Status: DC | PRN

## 2012-06-07 MED ORDER — GLIMEPIRIDE 2 MG PO TABS
2.0000 mg | ORAL_TABLET | Freq: Every day | ORAL | Status: DC
  Administered 2012-06-07 – 2012-06-08 (×2): 2 mg via ORAL
  Filled 2012-06-07 (×3): qty 1

## 2012-06-07 MED ORDER — ISOSORBIDE MONONITRATE ER 30 MG PO TB24: 30.0000 mg | ORAL_TABLET | Freq: Every day | ORAL | Status: DC

## 2012-06-07 MED ORDER — IRBESARTAN 75 MG PO TABS
75.0000 mg | ORAL_TABLET | Freq: Every day | ORAL | Status: DC
  Administered 2012-06-07 – 2012-06-08 (×2): 75 mg via ORAL
  Filled 2012-06-07 (×2): qty 1

## 2012-06-07 MED ORDER — ASPIRIN EC 81 MG PO TBEC
81.0000 mg | DELAYED_RELEASE_TABLET | Freq: Every day | ORAL | Status: DC
  Administered 2012-06-07 – 2012-06-08 (×2): 81 mg via ORAL
  Filled 2012-06-07 (×2): qty 1

## 2012-06-07 MED ORDER — METOPROLOL TARTRATE 25 MG PO TABS
25.0000 mg | ORAL_TABLET | Freq: Two times a day (BID) | ORAL | Status: DC
  Administered 2012-06-07 – 2012-06-08 (×3): 25 mg via ORAL
  Filled 2012-06-07 (×5): qty 1

## 2012-06-07 MED ORDER — CLOPIDOGREL BISULFATE 75 MG PO TABS
75.0000 mg | ORAL_TABLET | Freq: Every day | ORAL | Status: DC
  Administered 2012-06-07 – 2012-06-08 (×2): 75 mg via ORAL
  Filled 2012-06-07 (×3): qty 1

## 2012-06-07 MED ORDER — HYDROCHLOROTHIAZIDE 25 MG PO TABS
25.0000 mg | ORAL_TABLET | Freq: Every day | ORAL | Status: DC
  Administered 2012-06-07 – 2012-06-08 (×2): 25 mg via ORAL
  Filled 2012-06-07 (×2): qty 1

## 2012-06-07 MED ORDER — FAMOTIDINE 20 MG PO TABS
20.0000 mg | ORAL_TABLET | Freq: Every day | ORAL | Status: DC
  Administered 2012-06-07 – 2012-06-08 (×2): 20 mg via ORAL
  Filled 2012-06-07 (×2): qty 1

## 2012-06-07 MED ORDER — ATORVASTATIN CALCIUM 40 MG PO TABS
40.0000 mg | ORAL_TABLET | Freq: Every day | ORAL | Status: DC
  Administered 2012-06-07: 40 mg via ORAL
  Filled 2012-06-07 (×2): qty 1

## 2012-06-07 NOTE — Progress Notes (Signed)
PROGRESS NOTE  Subjective:   Pt is admitted with 4 days of CP - relieved with NTG.  Enzymes are negative so far Cath revealed no significant changed from previous cath in March.  Stents in RCA are patent.  Mild in stent restenosis of the distal RCA stented segment.  Objective:    Vital Signs:   Temp:  [97 F (36.1 C)-98.5 F (36.9 C)] 97.9 F (36.6 C) (11/08 0400) Pulse Rate:  [57-89] 75  (11/08 0400) Resp:  [17-18] 18  (11/07 2309) BP: (105-132)/(63-81) 125/76 mmHg (11/08 0400) SpO2:  [93 %-99 %] 93 % (11/08 0400)  Last BM Date: 06/06/12   24-hour weight change: Weight change:   Weight trends: Filed Weights   06/05/12 1201 06/05/12 2000 06/06/12 0600  Weight: 184 lb (83.462 kg) 177 lb 5.8 oz (80.45 kg) 177 lb 0.5 oz (80.3 kg)    Intake/Output:  11/07 0701 - 11/08 0700 In: 1155 [I.V.:1155] Out: -      Physical Exam: BP 125/76  Pulse 75  Temp 97.9 F (36.6 C) (Oral)  Resp 18  Ht 5\' 6"  (1.676 m)  Wt 177 lb 0.5 oz (80.3 kg)  BMI 28.57 kg/m2  SpO2 93%  General: Vital signs reviewed and noted. Well-developed, well-nourished, in no acute distress; alert, appropriate and cooperative . pain free  Head: Normocephalic, atraumatic.  Eyes: conjunctivae/corneas clear.  EOM's intact.   Throat: normal  Neck: Supple. Normal carotids. No JVD  Lungs:  Clear to auscultation  Heart: Regular rate,  With normal  S1 S2. No murmurs, gallops or rubs  Abdomen:  Soft, non-tender, non-distended with normoactive bowel sounds. No hepatomegaly. No rebound/guarding. No abdominal masses.  Extremities: Distal pedal pulses are 2+ .  No edema.    Neurologic: A&O X3, CN II - XII are grossly intact. Motor strength is 5/5 in the all 4 extremities.  Psych: Responds to questions appropriately with normal affect.    Labs: BMET:  Basename 06/06/12 0530 06/05/12 1228  NA 137 135  K 3.9 3.8  CL 101 99  CO2 27 26  GLUCOSE 207* 188*  BUN 17 16  CREATININE 1.12 1.15  CALCIUM 8.9 9.2  MG --  --  PHOS -- --    Liver function tests:  Basename 06/05/12 1228  AST 16  ALT 23  ALKPHOS 68  BILITOT 1.5*  PROT 6.7  ALBUMIN 3.2*   No results found for this basename: LIPASE:2,AMYLASE:2 in the last 72 hours  CBC:  Basename 06/07/12 0410 06/06/12 0530 06/05/12 1228  WBC 4.9 5.5 --  NEUTROABS -- -- 4.5  HGB 13.0 13.2 --  HCT 38.3* 39.1 --  MCV 76.9* 75.9* --  PLT 147* 148* --    Cardiac Enzymes:  Basename 06/06/12 0925 06/06/12 0108 06/05/12 2017  CKTOTAL -- -- --  CKMB -- -- --  TROPONINI <0.30 <0.30 <0.30    Coagulation Studies:  Basename 06/06/12 1146  LABPROT 13.6  INR 1.05      Tele:  NSR  Medications:    Infusions:    . [EXPIRED] sodium chloride    . [DISCONTINUED] sodium chloride 1 mL/kg/hr (06/06/12 0430)  . [DISCONTINUED] sodium chloride    . [DISCONTINUED] sodium chloride 75 mL/hr at 06/06/12 0800  . [DISCONTINUED] heparin 1,000 Units/hr (06/06/12 1224)  . [DISCONTINUED] nitroGLYCERIN 5 mcg/min (06/05/12 2005)    Scheduled Medications:    . [COMPLETED] diazepam  2 mg Oral On Call  . [COMPLETED] fentaNYL      . [COMPLETED]  heparin      . [COMPLETED] lidocaine      . [COMPLETED] midazolam      . [COMPLETED] nitroGLYCERIN      . [DISCONTINUED] aspirin  324 mg Oral Pre-Cath  . [DISCONTINUED] aspirin EC  81 mg Oral Daily  . [DISCONTINUED] atorvastatin  40 mg Oral q1800  . [DISCONTINUED] clopidogrel  75 mg Oral Daily  . [DISCONTINUED] diazepam  5 mg Oral On Call  . [DISCONTINUED] famotidine  20 mg Oral Daily  . [DISCONTINUED] hydrochlorothiazide  25 mg Oral Daily  . [DISCONTINUED] insulin aspart  0-9 Units Subcutaneous Q4H  . [DISCONTINUED] insulin glargine  10 Units Subcutaneous QHS  . [DISCONTINUED] irbesartan  75 mg Oral Daily  . [DISCONTINUED] levothyroxine  88 mcg Oral QAC breakfast  . [DISCONTINUED] metoprolol tartrate  25 mg Oral BID  . [DISCONTINUED] sodium chloride  3 mL Intravenous Q12H  . [DISCONTINUED] sodium chloride  3  mL Intravenous Q12H    Assessment/ Plan:    Intermediate coronary syndrome (06/05/2012) Symptoms are consistent with unstable angina.  Enzymes are negative so far.  Cath showed no significant large vessel disease that would require PCI.  He has several tiny branches that are severely disease and are likely the cause of his angina.  OK to DC to home today. Add Imdur 30  Continue other home medications ( pharmacy is re-entering them).  They were stopped after cath yesterday. We will see him in the office in several weeks.  Lori to see in several weeks and I will see him in several months - sooner if needed.  Hypertension Stable  Hypothyroidism  Diabetes Mellitus.  Dyslipidemia   Disposition: DC to home today.  Length of Stay: 2  Vesta Mixer, Montez Hageman., MD, [facility] 06/07/2012, 7:38 AM Office [phone] Pager [phone]

## 2012-06-07 NOTE — Research (Signed)
Transferred to 5530 by wheelchair, stable. Belongings with pt. Report given to RN

## 2012-06-07 NOTE — Progress Notes (Signed)
Groin ooze noted. Manual compression held by cath lab for 20 minutes, however, he re-oozed. Dr. Eden Emms assessed patient this evening and oozing has stopped. However, will observe him overnight for any complications or rebleeding. Note to Saturday PA: discharge summary has been pended under Incomplete notes. Imdur rx is on the chart. If he goes home tomorrow, his d/c summary will need addendum to reflect outcome of groin ooze and any medication changes if necessary.  Kyarah Enamorado PA-C

## 2012-06-07 NOTE — Discharge Summary (Signed)
Original note drafted by Ronie Spies, PA-C. Edits have been made where necessary to reflect the final inpatient day prior to discharge.  Discharge Summary   Patient ID: Benjamin Harrison MRN: [id number], DOB/AGE: 02/16/1935 77 y.o. Admit date: 06/05/2012 D/C date:     06/08/2012  Primary Cardiologist: [name]  Primary Discharge Diagnoses:  1. CAD with unstable angina this admission - stable cath this admission but tiny branches severely diseased likely causing his angina - CABG 2001, early failure of VG-RCA - inferior MI after Lexiscan cardiolite 01/28/2009 s/p stenting to RCA - PTCA/stenting distal RCA 02/24/2009 - PTCA/DES to distal RCA and DES to mid RCA 09/2010 - NSTEMI 09/2011 - balloon angioplasty of the distal right coronary for in-stent restenosis 2. HTN 3. Hyperlipidemia 4. Diabetes mellitus 5. Groin ooze  Secondary Discharge Diagnoses:  1. Osteoarthritis 2. Tobacco abuse 3. Colitis 07/2011 4. Hypothyroidism   Hospital Course: Benjamin Harrison is a 77 y/o M with hx of CAD s/p CABG 2001 with MI after Lexiscan cardiolite 2010 with multiple PCI to RCA, HTN, DM who presented to [facility] with complaints of chest pain/pressure reminiscent of his prior angina that began 4 days prior to admission. Accompanied with SOB at rest, weakness, and headache. No cough, heart burn, nausea, vomting, abdominal pain, numbness or weakness. He stated he took a lot of NTG with complete resolution. He presented to ED for evaluation. EKG was unremarkable. His pain was felt concerning for [place] given his history so he was admitted to the hospital and placed on IV NTG and heparin. Troponins remained neg. PRU was 166. Cardiac cath was recommended and performed on 06/06/12 demonstrating severe native CAD, patent grafts to the LAD, D1, OM1; the graft to the distal RCA is occluded, normal LV function. Dr. Elease Hashimoto reported no significant changed from previous cath in March. The stents in RCA are patent. There was mild  in stent restenosis of the distal RCA stented segment. He had several tiny branches that are severely diseased and likely the cause of his angina. There was no significant large vessel disease that would require PCI. Dr. Elease Hashimoto added Imdur. He did develop a late groin ooze after his procedure, thus was observed. Dr. Eden Emms has seen and examined him and feels he is stable for discharge. Bacitracin and continued bandaging has been been recommended over the weekend. He is advised to call the office with any complaints regarding his groin site.   Discharge Vitals: Blood pressure 110/71, pulse 74, temperature 98.2 F (36.8 C), temperature source Oral, resp. rate 18, height 5\' 6"  (1.676 m), weight 80.3 kg (177 lb 0.5 oz), SpO2 97.00%.  Labs: Lab Results  Component Value Date   WBC 5.4 06/08/2012   HGB 12.9* 06/08/2012   HCT 37.5* 06/08/2012   MCV 75.5* 06/08/2012   PLT 147* 06/08/2012     Lab 06/06/12 0530 06/05/12 1228  NA 137 --  K 3.9 --  CL 101 --  CO2 27 --  BUN 17 --  CREATININE 1.12 --  CALCIUM 8.9 --  PROT -- 6.7  BILITOT -- 1.5*  ALKPHOS -- 68  ALT -- 23  AST -- 16  GLUCOSE 207* --    Basename 06/06/12 0925 06/06/12 0108 06/05/12 2017  CKTOTAL -- -- --  CKMB -- -- --  TROPONINI <0.30 <0.30 <0.30   Lab Results  Component Value Date   CHOL 82 10/31/2011   HDL 26.80* 10/31/2011   LDLCALC 43 10/31/2011   TRIG 63.0 10/31/2011  Diagnostic Studies/Procedures   1. Cardiac catheterization this admission, please see full report and above for summary.  2. Dg Chest 2 View11/12/2011  *RADIOLOGY REPORT*  Clinical Data: Chest pain, shortness of breath, history diabetes, hypertension, coronary artery disease post MI  CHEST - 2 VIEW  Comparison: 10/08/2011  Findings: Enlargement of cardiac silhouette post CABG. Mediastinal contours and pulmonary vascularity normal. Chronic elevation right diaphragm with minimal right basilar atelectasis. No acute failure or consolidation. No pleural effusion or  pneumothorax. Bones unremarkable.  IMPRESSION: Enlargement of cardiac silhouette post CABG. Minimal chronic right basilar atelectasis. No acute abnormalities.   Original Report Authenticated By: Ulyses Southward, M.D.     Discharge Medications   Current Discharge Medication List    START taking these medications   Details  isosorbide mononitrate (IMDUR) 30 MG 24 hr tablet Take 1 tablet (30 mg total) by mouth daily. Qty: 30 tablet, Refills: 6      CONTINUE these medications which have NOT CHANGED   Details  aspirin 81 MG tablet Take 1 tablet (81 mg total) by mouth daily.    clopidogrel (PLAVIX) 75 MG tablet Take 1 tablet (75 mg total) by mouth daily. Qty: 30 tablet, Refills: 5    famotidine (PEPCID) 20 MG tablet Take 1 tablet (20 mg total) by mouth daily. Qty: 30 tablet    glimepiride (AMARYL) 2 MG tablet Take 2 mg by mouth daily before breakfast.      hydrochlorothiazide (HYDRODIURIL) 25 MG tablet TAKE 1 TABLET (25 MG TOTAL) BY MOUTH DAILY. Qty: 30 tablet, Refills: 5    levothyroxine (SYNTHROID, LEVOTHROID) 88 MCG tablet Take 88 mcg by mouth daily.      metoprolol tartrate (LOPRESSOR) 25 MG tablet Take 1 tablet (25 mg total) by mouth 2 (two) times daily. Qty: 60 tablet, Refills: 5    nitroGLYCERIN (NITROSTAT) 0.4 MG SL tablet Place 1 tablet (0.4 mg total) under the tongue every 5 (five) minutes as needed. For chest pain. Qty: 25 tablet, Refills: 6       rosuvastatin (CRESTOR) 20 MG tablet Take 20 mg by mouth at bedtime.    valsartan (DIOVAN) 80 MG tablet Take 80 mg by mouth daily.        Disposition   The patient will be discharged in stable condition to home. Discharge Orders    Future Appointments: Provider: Department: Dept Phone: Center:   06/17/2012 10:00 AM Rosalio Macadamia, NP [facility] Main Office [place]) [phone] [id number]     Future Orders Please Complete By Expires   Diet - low sodium heart healthy      Comments:   Diabetic Diet   Increase  activity slowly      Comments:   No driving for 2 days. No lifting over 5 lbs for 1 week. No sexual activity for 1 week. Keep procedure site clean & dry. If you notice increased pain, swelling, bleeding or pus, call/return!  You may shower, but no soaking baths/hot tubs/pools for 1 week.     Follow-up Information    Follow up with Norma Fredrickson, NP. (06/17/12 at 10am)    Contact information:   [address] [place] [address] [phone]            Duration of Discharge Encounter: Greater than 30 minutes including physician and PA time.  Signed, Shaune Spittle, ROGER PA-C 06/08/2012, 1:50 PM   Attending Note:   The patient was seen and examined.  Agree with assessment and plan as noted above.  See my note from 11/8 and Dr. Fabio Bering note from 11/9. Pt has been stable from a cardiac standpoint.  He developed a persistent groin bleed / ooz  the day after his cath.   He was held 1 extra day to ensure that his groin was stable and was discharged in stable condition.  Vesta Mixer, Montez Hageman., MD, [facility] 06/10/2012, 5:39 AM

## 2012-06-07 NOTE — Progress Notes (Signed)
Groin checked, slow subcutaneous ooze present.  Manual pressure applied to rfa site for 20 minutes. No further ooze noted. Gauze with tegaderm dressing applied. Rt dp and pt pulses present .

## 2012-06-07 NOTE — Progress Notes (Signed)
Right groin dressing saturated with dark red blood with small blood clots. Site is soft to touch. Manual pressure applied for 20 min. Pulses are palpable. Pressure  Dressing applied . Continue to monitor. PA made aware and gave an order not to discharge home pt. Today.

## 2012-06-07 NOTE — Progress Notes (Signed)
Inpatient Diabetes Program Recommendations  AACE/ADA: New Consensus Statement on Inpatient Glycemic Control (2013)  Target Ranges:  Prepandial:   less than 140 mg/dL      Peak postprandial:   less than 180 mg/dL (1-2 hours)      Critically ill patients:  140 - 180 mg/dL   Results for Benjamin Harrison, Benjamin Harrison (MRN [id number]) as of 06/07/2012 14:09  Ref. Range 06/06/2012 19:35 06/06/2012 23:07 06/07/2012 08:03 06/07/2012 12:20  Glucose-Capillary Latest Range: 70-99 mg/dL 952 (H) 841 (H) 324 (H) 183 (H)   Inpatient Diabetes Program Recommendations Correction (SSI): restart Novolog sensitive scale TID + HS scale  Thank you  Piedad Climes [facility] Inpatient Diabetes Coordinator [phone]

## 2012-06-08 DIAGNOSIS — D696 Thrombocytopenia, unspecified: Secondary | ICD-10-CM

## 2012-06-08 LAB — CBC
HCT: 37.5 % — ABNORMAL LOW (ref 39.0–52.0)
Hemoglobin: 12.9 g/dL — ABNORMAL LOW (ref 13.0–17.0)
RBC: 4.97 MIL/uL (ref 4.22–5.81)
RDW: 14.8 % (ref 11.5–15.5)
WBC: 5.4 10*3/uL (ref 4.0–10.5)

## 2012-06-08 MED ORDER — BACITRACIN 500 UNIT/GM EX OINT: 1.0000 "application " | TOPICAL_OINTMENT | Freq: Two times a day (BID) | CUTANEOUS | Status: DC

## 2012-06-08 NOTE — Progress Notes (Signed)
Patient ID: Benjamin Harrison, male   DOB: 04-May-1935, 77 y.o.   MRN: [id number]   PROGRESS NOTE  Subjective:   Pt is admitted with 4 days of CP - relieved with NTG.  Enzymes are negative so far Cath revealed no significant changed from previous cath in March.  Stents in RCA are patent.  Mild in stent restenosis of the distal RCA stented segment. Cath sight with small capillary ooze  Objective:    Vital Signs:   Temp:  [97.2 F (36.2 C)-99.1 F (37.3 C)] 98.2 F (36.8 C) (11/09 0814) Pulse Rate:  [65-79] 74  (11/09 1011) Resp:  [17-18] 18  (11/09 0814) BP: (110-120)/(62-71) 110/71 mmHg (11/09 0735) SpO2:  [93 %-98 %] 93 % (11/09 0814)  Last BM Date: 06/06/12   24-hour weight change: Weight change:   Weight trends: Filed Weights   06/05/12 1201 06/05/12 2000 06/06/12 0600  Weight: 184 lb (83.462 kg) 177 lb 5.8 oz (80.45 kg) 177 lb 0.5 oz (80.3 kg)    Intake/Output:  11/08 0701 - 11/09 0700 In: 260 [P.O.:260] Out: 350 [Urine:350]     Physical Exam: BP 110/71  Pulse 74  Temp 98.2 F (36.8 C) (Oral)  Resp 18  Ht 5\' 6"  (1.676 m)  Wt 177 lb 0.5 oz (80.3 kg)  BMI 28.57 kg/m2  SpO2 93%  General: Vital signs reviewed and noted. Well-developed, well-nourished, in no acute distress; alert, appropriate and cooperative . pain free  Head: Normocephalic, atraumatic.  Eyes: conjunctivae/corneas clear.  EOM's intact.   Throat: normal  Neck: Supple. Normal carotids. No JVD  Lungs:  Clear to auscultation  Heart: Regular rate,  With normal  S1 S2. No murmurs, gallops or rubs  Abdomen:  Soft, non-tender, non-distended with normoactive bowel sounds. No hepatomegaly. No rebound/guarding. No abdominal masses.  Extremities: Distal pedal pulses are 2+ .  No edema.  No ooze at cath sight now  Neurologic: A&O X3, CN II - XII are grossly intact. Motor strength is 5/5 in the all 4 extremities.  Psych: Responds to questions appropriately with normal affect.    Labs: BMET:  Basename  06/06/12 0530 06/05/12 1228  NA 137 135  K 3.9 3.8  CL 101 99  CO2 27 26  GLUCOSE 207* 188*  BUN 17 16  CREATININE 1.12 1.15  CALCIUM 8.9 9.2  MG -- --  PHOS -- --    Liver function tests:  Basename 06/05/12 1228  AST 16  ALT 23  ALKPHOS 68  BILITOT 1.5*  PROT 6.7  ALBUMIN 3.2*   No results found for this basename: LIPASE:2,AMYLASE:2 in the last 72 hours  CBC:  Basename 06/08/12 0522 06/07/12 0410 06/05/12 1228  WBC 5.4 4.9 --  NEUTROABS -- -- 4.5  HGB 12.9* 13.0 --  HCT 37.5* 38.3* --  MCV 75.5* 76.9* --  PLT 147* 147* --    Cardiac Enzymes:  Basename 06/06/12 0925 06/06/12 0108 06/05/12 2017  CKTOTAL -- -- --  CKMB -- -- --  TROPONINI <0.30 <0.30 <0.30    Coagulation Studies:  Basename 06/06/12 1146  LABPROT 13.6  INR 1.05      Tele:  NSR 06/08/2012    Medications:    Infusions:    Scheduled Medications:    . aspirin EC  81 mg Oral Daily  . atorvastatin  40 mg Oral q1800  . clopidogrel  75 mg Oral Q breakfast  . famotidine  20 mg Oral Daily  . glimepiride  2 mg Oral  Q breakfast  . hydrochlorothiazide  25 mg Oral Daily  . irbesartan  75 mg Oral Daily  . isosorbide mononitrate  30 mg Oral Daily  . levothyroxine  88 mcg Oral QAC breakfast  . metoprolol tartrate  25 mg Oral BID    Assessment/ Plan:    Intermediate coronary syndrome (06/05/2012) Symptoms are consistent with unstable angina.  Enzymes are negative so far.  Cath showed no significant large vessel disease that would require PCI.  He has several tiny branches that are severely disease and are likely the cause of his angina.  OK to DC to home today. Add Imdur 30  Continue other home medications ( pharmacy is re-entering them).  They were stopped after cath yesterday. We will see him in the office in several weeks.  Lori to see in several weeks and I will see him in several months - sooner if needed.  Hypertension Stable  Hypothyroidism  Diabetes  Mellitus.  Dyslipidemia   Disposition: DC to home today.  He has chronically low PLT;s that may have contributed to capillary skin ooze.  Bacitracin and bandage for rest of weekend F/U [name] as indicated  Length of Stay: 3  [employer]

## 2012-06-08 NOTE — Progress Notes (Signed)
Discharge instructions reviewed with patient and wife, who both appeared to have excellent understanding of medications, side effects, new medications, prescriptions, and F/U care.  Teach-back method eimployed.  Pt. Discharged to home accompanied by wife.  Escorted to exit via wheelchair by nurse tech.

## 2012-06-17 ENCOUNTER — Encounter: Payer: Self-pay | Admitting: Nurse Practitioner

## 2012-06-17 ENCOUNTER — Ambulatory Visit (INDEPENDENT_AMBULATORY_CARE_PROVIDER_SITE_OTHER): Payer: Medicare Other | Admitting: Nurse Practitioner

## 2012-06-17 VITALS — BP 120/72 | HR 60 | Ht 66.0 in | Wt 182.4 lb

## 2012-06-17 DIAGNOSIS — I208 Other forms of angina pectoris: Secondary | ICD-10-CM

## 2012-06-17 LAB — CBC WITH DIFFERENTIAL/PLATELET
Basophils Absolute: 0 10*3/uL (ref 0.0–0.1)
Basophils Relative: 0.8 % (ref 0.0–3.0)
Eosinophils Absolute: 0.2 10*3/uL (ref 0.0–0.7)
Eosinophils Relative: 3.9 % (ref 0.0–5.0)
HCT: 38.9 % — ABNORMAL LOW (ref 39.0–52.0)
Hemoglobin: 13.1 g/dL (ref 13.0–17.0)
Lymphocytes Relative: 20.3 % (ref 12.0–46.0)
Lymphs Abs: 1.1 10*3/uL (ref 0.7–4.0)
MCHC: 33.7 g/dL (ref 30.0–36.0)
MCV: 77 fl — ABNORMAL LOW (ref 78.0–100.0)
Monocytes Absolute: 0.6 10*3/uL (ref 0.1–1.0)
Monocytes Relative: 11.9 % (ref 3.0–12.0)
Neutro Abs: 3.4 10*3/uL (ref 1.4–7.7)
Neutrophils Relative %: 63.1 % (ref 43.0–77.0)
Platelets: 218 10*3/uL (ref 150.0–400.0)
RBC: 5.05 Mil/uL (ref 4.22–5.81)
RDW: 16.6 % — ABNORMAL HIGH (ref 11.5–14.6)
WBC: 5.3 10*3/uL (ref 4.5–10.5)

## 2012-06-17 LAB — BASIC METABOLIC PANEL
BUN: 23 mg/dL (ref 6–23)
CO2: 28 mEq/L (ref 19–32)
Calcium: 8.9 mg/dL (ref 8.4–10.5)
Chloride: 106 mEq/L (ref 96–112)
Creatinine, Ser: 1.2 mg/dL (ref 0.4–1.5)
GFR: 60.89 mL/min (ref >60.00)
Glucose, Bld: 279 mg/dL — ABNORMAL HIGH (ref 70–99)
Potassium: 4 mEq/L (ref 3.5–5.1)
Sodium: 139 mEq/L (ref 135–145)

## 2012-06-17 NOTE — Progress Notes (Signed)
[name] Date of Birth: Jan 08, 1935 Medical Record #[id number]  History of Present Illness: Benjamin Harrison is seen today for a post hospital visit. He is seen for Dr. Elease Hashimoto. He has known CAD with past CABG in 2001 and early failure of the SVG to the RCA. He has had prior inferior MI after Lexiscan in July of 2010 with stenting of the RCA. He has had DES to the distal RCA and DES to the mid RCA in March of 2012. Other issues include HTN, HLD, DM, OA, tobacco abuse and hypothyroidism.  He was most recently admitted with chest pain/angina. Underwent repeat cath with stable findings but felt to have small vessel disease. Imdur was added. He did develop a late groin ooze after his procedure and was kept an extra day for observation.  He comes in today. He is here alone. He says his chest pain has improved. He still feels weak and is not doing very much. His head hurts from the Imdur. He thinks this might be making him weak. Not dizzy or lightheaded. His groin has improved.   Current Outpatient Prescriptions on File Prior to Visit  Medication Sig Dispense Refill  . aspirin 81 MG tablet Take 1 tablet (81 mg total) by mouth daily.      . clopidogrel (PLAVIX) 75 MG tablet Take 1 tablet (75 mg total) by mouth daily.  30 tablet  5  . famotidine (PEPCID) 20 MG tablet Take 1 tablet (20 mg total) by mouth daily.  30 tablet    . glimepiride (AMARYL) 2 MG tablet Take 2 mg by mouth daily before breakfast.        . hydrochlorothiazide (HYDRODIURIL) 25 MG tablet TAKE 1 TABLET (25 MG TOTAL) BY MOUTH DAILY.  30 tablet  5  . isosorbide mononitrate (IMDUR) 30 MG 24 hr tablet Take 1 tablet (30 mg total) by mouth daily.  30 tablet  6  . levothyroxine (SYNTHROID, LEVOTHROID) 88 MCG tablet Take 88 mcg by mouth daily.        . metoprolol tartrate (LOPRESSOR) 25 MG tablet Take 1 tablet (25 mg total) by mouth 2 (two) times daily.  60 tablet  5  . nitroGLYCERIN (NITROSTAT) 0.4 MG SL tablet Place 1 tablet (0.4 mg total)  under the tongue every 5 (five) minutes as needed. For chest pain.      . rosuvastatin (CRESTOR) 20 MG tablet Take 20 mg by mouth at bedtime.      . valsartan (DIOVAN) 80 MG tablet Take 80 mg by mouth daily.        Allergies  Allergen Reactions  . Codeine     Makes pt Sick    Past Medical History  Diagnosis Date  . Coronary artery disease     a. CABG 2001 with early failure of VG-RCA. b. Inf MI after Lexiscan 01/28/2009 s/p RCA stent. c. Stent to dRCA 02/24/09. d. DES to dRCA & mRCA  2012. e. NSTEMI s/p angioplasty for ISR of RCA 09/2011. f. Severe distal diffuse dz likely cause of angina by cath 05/2012.  Marland Kitchen Hypertension   . Hypothyroidism   . Hyperlipidemia   . Personal history of tobacco use     REMOTE IN THE PAST  . Diabetes mellitus   . Dyslipidemia   . Osteoarthritis     End-stage osteoarthritis, right knee, medial compartment    Past Surgical History  Procedure Date  . Coronary artery bypass graft   . Cardiac catheterization   . Knee arthroscopy  1999 and 2004  . Hernia repair 1985    History  Smoking status  . Former Smoker  . Quit date: 08/01/1975  Smokeless tobacco  . Not on file    History  Alcohol Use No    Family History  Problem Relation Age of Onset  . Hypertension Mother   . Cancer Sister     Throat Cancer  . Heart disease Brother   . Pneumonia Brother   . Heart disease Brother   . Cancer Brother     Prostate Cancer    Review of Systems: The review of systems is per the HPI.  All other systems were reviewed and are negative.  Physical Exam: BP 120/72  Pulse 60  Ht 5\' 6"  (1.676 m)  Wt 182 lb 6.4 oz (82.736 kg)  BMI 29.44 kg/m2 Patient is very pleasant and in no acute distress. He is obese. Skin is warm and dry. Color is normal.  HEENT is unremarkable. Normocephalic/atraumatic. PERRL. Sclera are nonicteric. Neck is supple. No masses. No JVD. Lungs are clear. Cardiac exam shows a regular rate and rhythm. Abdomen is soft. Extremities are  without edema. Gait and ROM are intact. No gross neurologic deficits noted.  LABORATORY DATA: BMET and CBC are pending.  Lab Results  Component Value Date   WBC 5.4 06/08/2012   HGB 12.9* 06/08/2012   HCT 37.5* 06/08/2012   PLT 147* 06/08/2012   GLUCOSE 207* 06/06/2012   CHOL 82 10/31/2011   TRIG 63.0 10/31/2011   HDL 26.80* 10/31/2011   LDLCALC 43 10/31/2011   ALT 23 06/05/2012   AST 16 06/05/2012   NA 137 06/06/2012   K 3.9 06/06/2012   CL 101 06/06/2012   CREATININE 1.12 06/06/2012   BUN 17 06/06/2012   CO2 27 06/06/2012   TSH 3.526 06/05/2012   INR 1.05 06/06/2012   HGBA1C 7.2* 06/05/2012    Coronary Angiography   Left Main: mild irregularities Proximal irregularities 20-30%  Left anterior Descending: occluded proximally  Left Circumflex: mild irregularities. OM 1 is normal. OM 2 has minor luminal irregularities. There is retrograde filling of the OM graft  Right Coronary Artery: mild - moderate diffuse disease. The proximal stent is patent. The distal stented segment is a long segment and is likely 2 adjoining stents. There is a 50% stenosis in the The flow appears to be good through this stent. There are moderate irregularities in the PLSA. The inferior IV septum appears to be supplied by the large RV marginal.  SVG to diag: Normal graft, anastomosis is normal. The distal Diag is normal. There is severe disease in the proximal D1 prior to the insertion of the graft. This vessel is supplied in a retrograde fashion from the graft and may be the source of angina chest pain.  SVG to RCA: Occluded proximally.  SVG to OM: large graft, normal. Anastomosis is normal.  LIMA to LAD: normal   LV Gram: Normal LV systolic function.    Conclusions:   1. Severe native CAD.  2. Patent grafts to the LAD, D1, OM1. The graft to the distal RCA is occluded.  3. Normal LV function.   Vesta Mixer, Montez Hageman., MD, [facility]  06/06/2012, 4:34 PM   Assessment / Plan: 1. CAD - with recent admission for chest  pain/angina - now on Imdur and his symptoms have improved. He is felt to have small vessel disease and there was no indication for repeat PCI or other intervention. I have asked him to try  the Imdur at night. This might help but I explained that with some time, he should develop a tolerance and this resolve.   2. Weakness/fatigue - may be related to the Imdur. I have asked him to try and increase his activities.   I will see him back in about 3 weeks to recheck. Will check some baseline labs today. He will try the Imdur at night. He was reminded that he is not able to use any ED drugs.   Patient is agreeable to this plan and will call if any problems develop in the interim.

## 2012-06-17 NOTE — Patient Instructions (Addendum)
I would like for you to start increasing your activity level  Stay on your current medicines  Try taking the Imdur at night. This might help your headache. The headaches will stop with a little time.   We need to check labs today (BMET/CBC)  I will see you in about 3 weeks to recheck you.  Call the [facility] office at [phone] if you have any questions, problems or concerns.

## 2012-07-03 ENCOUNTER — Other Ambulatory Visit: Payer: Self-pay | Admitting: *Deleted

## 2012-07-03 MED ORDER — METOPROLOL TARTRATE 25 MG PO TABS: 25.0000 mg | ORAL_TABLET | Freq: Two times a day (BID) | ORAL | Status: DC

## 2012-07-03 NOTE — Telephone Encounter (Signed)
Opened in Error.

## 2012-07-03 NOTE — Telephone Encounter (Signed)
Fax Received. Refill Completed. Benjamin Harrison (R.M.A)   

## 2012-07-04 ENCOUNTER — Other Ambulatory Visit: Payer: Self-pay | Admitting: *Deleted

## 2012-07-04 NOTE — Telephone Encounter (Signed)
Opened in Error.

## 2012-07-08 ENCOUNTER — Encounter: Payer: Self-pay | Admitting: Nurse Practitioner

## 2012-07-08 ENCOUNTER — Ambulatory Visit (INDEPENDENT_AMBULATORY_CARE_PROVIDER_SITE_OTHER): Payer: Medicare Other | Admitting: Nurse Practitioner

## 2012-07-08 VITALS — BP 110/78 | HR 64 | Ht 66.0 in | Wt 180.8 lb

## 2012-07-08 DIAGNOSIS — I259 Chronic ischemic heart disease, unspecified: Secondary | ICD-10-CM

## 2012-07-08 NOTE — Patient Instructions (Addendum)
Continue with your current medicines  Stay active.  Dr. Elease Hashimoto will see you back in 4 months  Call the [facility] office at [phone] if you have any questions, problems or concerns.

## 2012-07-08 NOTE — Progress Notes (Signed)
Benjamin Harrison Date of Birth: [birth year] Medical Record #[id number]  History of Present Illness: Benjamin Harrison is seen back today for a 3 week check. He is seen for Dr. [place] Dom CAD with past CABG in 2001 and early failure of the SVG to the RCA. He has had prior inferior MI after Lexiscan with stenting of the RCA in July of 2010. He has had DES to the distal RCA and DES to the mid RCA in March of 2012. Other issues include HTN, HLD, Dm, OA, tobacco abuse and hypothyroidism.   He has had repeat cath last month. Stable findings and felt to have small vessel disease. Imdur was added. He has normal LV function.   He comes back today. He is here alone. He is doing better. Told his wife that he now feels the best he has in a long time. No chest pain. Energy is ok. His headaches have improved. He actually cut his Imdur in half and has worked himself back up to the full dose. He is trying to walk more. Weight is down a couple of pounds.   Current Outpatient Prescriptions on File Prior to Visit  Medication Sig Dispense Refill  . aspirin 81 MG tablet Take 1 tablet (81 mg total) by mouth daily.      . clopidogrel (PLAVIX) 75 MG tablet Take 1 tablet (75 mg total) by mouth daily.  30 tablet  5  . famotidine (PEPCID) 20 MG tablet Take 1 tablet (20 mg total) by mouth daily.  30 tablet    . glimepiride (AMARYL) 2 MG tablet Take 2 mg by mouth daily before breakfast.        . hydrochlorothiazide (HYDRODIURIL) 25 MG tablet TAKE 1 TABLET (25 MG TOTAL) BY MOUTH DAILY.  30 tablet  5  . isosorbide mononitrate (IMDUR) 30 MG 24 hr tablet Take 1 tablet (30 mg total) by mouth daily.  30 tablet  6  . levothyroxine (SYNTHROID, LEVOTHROID) 88 MCG tablet Take 100 mcg by mouth daily.       . metoprolol tartrate (LOPRESSOR) 25 MG tablet Take 1 tablet (25 mg total) by mouth 2 (two) times daily.  60 tablet  5  . nitroGLYCERIN (NITROSTAT) 0.4 MG SL tablet Place 1 tablet (0.4 mg total) under the tongue every 5 (five) minutes as  needed. For chest pain.      . rosuvastatin (CRESTOR) 20 MG tablet Take 20 mg by mouth at bedtime.      . valsartan (DIOVAN) 80 MG tablet Take 80 mg by mouth daily.        Allergies  Allergen Reactions  . Codeine     Makes pt Sick    Past Medical History  Diagnosis Date  . Coronary artery disease     a. CABG 2001 with early failure of VG-RCA. b. Inf MI after Lexiscan 01/28/2009 s/p RCA stent. c. Stent to dRCA 02/24/09. d. DES to dRCA & mRCA  2012. e. NSTEMI s/p angioplasty for ISR of RCA 09/2011. f. Severe distal diffuse dz likely cause of angina by cath 05/2012.  Marland Kitchen Hypertension   . Hypothyroidism   . Hyperlipidemia   . Personal history of tobacco use     REMOTE IN THE PAST  . Diabetes mellitus   . Dyslipidemia   . Osteoarthritis     End-stage osteoarthritis, right knee, medial compartment    Past Surgical History  Procedure Date  . Coronary artery bypass graft   . Cardiac catheterization   . Knee  arthroscopy 1999 and 2004  . Hernia repair 1985    History  Smoking status  . Former Smoker  . Quit date: 08/01/1975  Smokeless tobacco  . Not on file    History  Alcohol Use No    Family History  Problem Relation Age of Onset  . Hypertension Mother   . Cancer Sister     Throat Cancer  . Heart disease Brother   . Pneumonia Brother   . Heart disease Brother   . Cancer Brother     Prostate Cancer    Review of Systems: The review of systems is per the HPI.  All other systems were reviewed and are negative.  Physical Exam: BP 110/78  Pulse 64  Ht 5\' 6"  (1.676 m)  Wt 180 lb 12.8 oz (82.01 kg)  BMI 29.18 kg/m2 Weight is down 2 pounds.  Patient is very pleasant and in no acute distress. Skin is warm and dry. Color is normal.  HEENT is unremarkable. Normocephalic/atraumatic. PERRL. Sclera are nonicteric. Neck is supple. No masses. No JVD. Lungs are clear. Cardiac exam shows a regular rate and rhythm. Abdomen is soft. Extremities are without edema. Gait and ROM are  intact. No gross neurologic deficits noted.   LABORATORY DATA: Lab Results  Component Value Date   WBC 5.3 06/17/2012   HGB 13.1 06/17/2012   HCT 38.9* 06/17/2012   PLT 218.0 06/17/2012   GLUCOSE 279* 06/17/2012   CHOL 82 10/31/2011   TRIG 63.0 10/31/2011   HDL 26.80* 10/31/2011   LDLCALC 43 10/31/2011   ALT 23 06/05/2012   AST 16 06/05/2012   NA 139 06/17/2012   K 4.0 06/17/2012   CL 106 06/17/2012   CREATININE 1.2 06/17/2012   BUN 23 06/17/2012   CO2 28 06/17/2012   TSH 3.526 06/05/2012   INR 1.05 06/06/2012   HGBA1C 7.2* 06/05/2012   Assessment / Plan: 1. Ischemic heart disease - prior CABG and multiple PCIs to the RCA - felt to have small vessel disease. Recently admitted with chest pain and was felt to have small vessel disease with no indication for repeat PCI or other intervention based on cath findings. He is doing well. He is asymptomatic at this time. No change in his medicines. Dr. Elease Hashimoto will see him back in about 3 to 4 months.   2. HLD - on statin therapy.  3. HTN - blood pressure looks good.   Patient is agreeable to this plan and will call if any problems develop in the interim.

## 2012-07-25 ENCOUNTER — Encounter: Payer: Self-pay | Admitting: Cardiovascular Disease

## 2012-09-14 ENCOUNTER — Other Ambulatory Visit: Payer: Self-pay

## 2012-10-07 ENCOUNTER — Ambulatory Visit (INDEPENDENT_AMBULATORY_CARE_PROVIDER_SITE_OTHER): Payer: Medicare Other | Admitting: Cardiovascular Disease

## 2012-10-07 ENCOUNTER — Other Ambulatory Visit: Payer: Self-pay | Admitting: *Deleted

## 2012-10-07 ENCOUNTER — Encounter: Payer: Self-pay | Admitting: Cardiovascular Disease

## 2012-10-07 VITALS — BP 120/72 | HR 64 | Ht 66.0 in | Wt 185.8 lb

## 2012-10-07 DIAGNOSIS — I2510 Atherosclerotic heart disease of native coronary artery without angina pectoris: Secondary | ICD-10-CM

## 2012-10-07 DIAGNOSIS — E785 Hyperlipidemia, unspecified: Secondary | ICD-10-CM

## 2012-10-07 NOTE — Progress Notes (Signed)
Benjamin Harrison Date of Birth  [birth year] [facility]     [place] Office  [address]    [address]   [address] [place], [place]  [address]    [place], [place]  [address] [phone]  Fax  [phone]  [phone]  Fax [phone]   Problem list:  1. Coronary artery disease: Status post CABG in2001. He status post stenting of his mid right coronary artery in 10/17/2010 2. Hypertension 3. Hypothyroidism 4. Hyperlipidemia 5. Diabetes mellitus  History of Present Illness:  Benjamin Harrison is a 78 year old gentleman with a history of coronary artery disease. He status post PTCA and stenting of his right coronary. He had repeat stenting of his right coronary artery as recently as March, 2012.  He had another heart attack and had severe stenosis of his distal right coronary artery stent. Dr. [place] opened up the stent in March, 2013.  Saphenous vein graft to right coronary artery is occluded.  Since that time he's had persistent headaches and generalized weakness.   He's continued to have some episodes of chest pain.  He's also had some episodes of lightheadedness.  He's feeling a bit better since I saw him several months ago. He still is not able to exercise with about 15 or 20 minutes. Is clear that he still eats salty foods. He enjoys peanut butter on Ritz crackers almost every night. He also eats occasional country ham and gravy biscuits.  October 07, 2012  He is doing well. He had a cath in November, 2013.  He has been on Imdur and is feeling better.    He occasionally has a headache.    Current Outpatient Prescriptions on File Prior to Visit  Medication Sig Dispense Refill  . aspirin 81 MG tablet Take 1 tablet (81 mg total) by mouth daily.      . clopidogrel (PLAVIX) 75 MG tablet Take 1 tablet (75 mg total) by mouth daily.  30 tablet  5  . famotidine (PEPCID) 20 MG tablet Take 1 tablet (20 mg total) by mouth daily.  30 tablet    . glimepiride (AMARYL) 2 MG  tablet Take 2 mg by mouth daily before breakfast.        . hydrochlorothiazide (HYDRODIURIL) 25 MG tablet TAKE 1 TABLET (25 MG TOTAL) BY MOUTH DAILY.  30 tablet  5  . isosorbide mononitrate (IMDUR) 30 MG 24 hr tablet Take 1 tablet (30 mg total) by mouth daily.  30 tablet  6  . metoprolol tartrate (LOPRESSOR) 25 MG tablet Take 1 tablet (25 mg total) by mouth 2 (two) times daily.  60 tablet  5  . nitroGLYCERIN (NITROSTAT) 0.4 MG SL tablet Place 1 tablet (0.4 mg total) under the tongue every 5 (five) minutes as needed. For chest pain.      . rosuvastatin (CRESTOR) 20 MG tablet Take 20 mg by mouth at bedtime.      . valsartan (DIOVAN) 80 MG tablet Take 80 mg by mouth daily.       No current facility-administered medications on file prior to visit.    Allergies  Allergen Reactions  . Codeine     Makes pt Sick    Past Medical History  Diagnosis Date  . Coronary artery disease     a. CABG 2001 with early failure of VG-RCA. b. Inf MI after Lexiscan 01/28/2009 s/p RCA stent. c. Stent to dRCA 02/24/09. d. DES to dRCA & mRCA  2012. e. NSTEMI s/p angioplasty for  ISR of RCA 09/2011. f. Severe distal diffuse dz likely cause of angina by cath 05/2012.  Marland Kitchen Hypertension   . Hypothyroidism   . Hyperlipidemia   . Personal history of tobacco use     REMOTE IN THE PAST  . Diabetes mellitus   . Dyslipidemia   . Osteoarthritis     End-stage osteoarthritis, right knee, medial compartment    Past Surgical History  Procedure Laterality Date  . Coronary artery bypass graft    . Cardiac catheterization    . Knee arthroscopy  1999 and 2004  . Hernia repair  1985    History  Smoking status  . Former Smoker  . Quit date: 08/01/1975  Smokeless tobacco  . Not on file    History  Alcohol Use No    Family History  Problem Relation Age of Onset  . Hypertension Mother   . Cancer Sister     Throat Cancer  . Heart disease Brother   . Pneumonia Brother   . Heart disease Brother   . Cancer Brother      Prostate Cancer    Reviw of Systems:  Reviewed in the HPI.  All other systems are negative.  Physical Exam: BP 120/72  Pulse 64  Ht 5\' 6"  (1.676 m)  Wt 185 lb 12.8 oz (84.278 kg)  BMI 30 kg/m2  SpO2 98% The patient is alert and oriented x 3.  The mood and affect are normal.   Skin: warm and dry.  Color is normal.    HEENT:   Normocephalic/atraumatic. His mucous membranes are moist. His neck is supple. Is no JVD. His carotids are normal.  Lungs: Lungs are clear. His back is nontender.   Heart: Regular rate S1-S2. He has no murmurs.    Abdomen: Has good bowel sounds. He is mildly obese. There is no hepatosplenomegaly.  Extremities:  No clubbing cyanosis or edema  Neuro:  Neuro exam is nonfocal. His gait is normal.    ECG:  Assessment / Plan:

## 2012-10-07 NOTE — Assessment & Plan Note (Signed)
I have encouraged him to work on a better diet and exercise program.

## 2012-10-07 NOTE — Patient Instructions (Addendum)
Your physician wants you to follow-up in: 6 months with EKG You will receive a reminder letter in the mail two months in advance. If you don't receive a letter, please call our office to schedule the follow-up appointment.   Your physician recommends that you return for a FASTING lipid profile: 6 months  Your physician recommends that you continue on your current medications as directed. Please refer to the Current Medication list given to you today.

## 2012-10-07 NOTE — Assessment & Plan Note (Signed)
He is doing well.  Imdur was added several months ago and he seems to be tolerating it without problems.  VS are stable.  I will see him in 6 months for OV, ECG, and fasting labs.

## 2012-10-15 ENCOUNTER — Other Ambulatory Visit: Payer: Self-pay | Admitting: *Deleted

## 2012-10-15 MED ORDER — HYDROCHLOROTHIAZIDE 25 MG PO TABS: 25.0000 mg | ORAL_TABLET | Freq: Every day | ORAL | Status: DC

## 2012-11-06 ENCOUNTER — Other Ambulatory Visit: Payer: Self-pay | Admitting: *Deleted

## 2012-11-06 MED ORDER — METOPROLOL TARTRATE 25 MG PO TABS: 25.0000 mg | ORAL_TABLET | Freq: Two times a day (BID) | ORAL | Status: DC

## 2012-11-08 ENCOUNTER — Other Ambulatory Visit: Payer: Self-pay | Admitting: *Deleted

## 2012-11-08 MED ORDER — VALSARTAN 80 MG PO TABS: 80.0000 mg | ORAL_TABLET | Freq: Every day | ORAL | Status: DC

## 2012-11-08 NOTE — Telephone Encounter (Signed)
Pharmacy is request pt Losartan, noticed this medication was not on pt medication list, called pt to verify and pt states that he is taking Diovan, pt states Dr. Elease Hashimoto changed him to this medication due to cost, notes states pt stopped taking this medication due to not taking for more then 30 days. Called pharmacy and she states pt last picked up his Diovan since January 2013 and last picked up his Losartan Jan 2014. Pharmacist expressed that patient seemed a bit confused when requesting which medications he wanted refills. Filled pt Diovan due to losartan being on his record and discontinued 2013. Fax Received. Refill Completed. Sianne Tejada Chowoe (R.M.A)

## 2012-11-11 ENCOUNTER — Other Ambulatory Visit: Payer: Self-pay | Admitting: *Deleted

## 2012-11-11 MED ORDER — LOSARTAN POTASSIUM 100 MG PO TABS: 100.0000 mg | ORAL_TABLET | Freq: Every day | ORAL | Status: DC

## 2012-11-11 NOTE — Telephone Encounter (Signed)
Fax Received. Refill Completed. Benjamin Harrison (R.M.A)   

## 2012-11-25 ENCOUNTER — Other Ambulatory Visit: Payer: Self-pay | Admitting: *Deleted

## 2012-11-25 MED ORDER — HYDROCHLOROTHIAZIDE 25 MG PO TABS: 25.0000 mg | ORAL_TABLET | Freq: Every day | ORAL | Status: DC

## 2012-11-25 NOTE — Telephone Encounter (Signed)
Fax Received. Refill Completed. Benjamin Harrison (R.M.A)   

## 2012-11-29 ENCOUNTER — Other Ambulatory Visit: Payer: Self-pay | Admitting: *Deleted

## 2012-11-29 MED ORDER — CLOPIDOGREL BISULFATE 75 MG PO TABS: 75.0000 mg | ORAL_TABLET | Freq: Every day | ORAL | Status: DC

## 2012-11-29 MED ORDER — ISOSORBIDE MONONITRATE ER 30 MG PO TB24: 30.0000 mg | ORAL_TABLET | Freq: Every day | ORAL | Status: DC

## 2012-12-04 ENCOUNTER — Other Ambulatory Visit: Payer: Self-pay | Admitting: *Deleted

## 2012-12-04 MED ORDER — ISOSORBIDE MONONITRATE ER 30 MG PO TB24: 30.0000 mg | ORAL_TABLET | Freq: Every day | ORAL | Status: DC

## 2012-12-04 MED ORDER — CLOPIDOGREL BISULFATE 75 MG PO TABS: 75.0000 mg | ORAL_TABLET | Freq: Every day | ORAL | Status: DC

## 2012-12-04 NOTE — Telephone Encounter (Signed)
Fax Received. Refill Completed. Benjamin Harrison (R.M.A)   

## 2013-02-03 ENCOUNTER — Other Ambulatory Visit: Payer: Self-pay | Admitting: *Deleted

## 2013-02-03 NOTE — Telephone Encounter (Signed)
Pharmacy is requesting pt Atorvastatin and record states pt is on Rosuvastatin. Called pt to verify and he states he is taking Rosucastatin. Called pharmacy and spoke with Shanda Bumps and she stated she will change it in their system.

## 2013-02-04 ENCOUNTER — Other Ambulatory Visit: Payer: Self-pay | Admitting: *Deleted

## 2013-02-05 ENCOUNTER — Other Ambulatory Visit: Payer: Self-pay | Admitting: Cardiovascular Disease

## 2013-02-18 ENCOUNTER — Telehealth: Payer: Self-pay | Admitting: *Deleted

## 2013-02-18 NOTE — Telephone Encounter (Signed)
Refill not approved and discarded for Atorvastatin.

## 2013-03-03 ENCOUNTER — Other Ambulatory Visit: Payer: Self-pay | Admitting: Family Medicine

## 2013-03-03 DIAGNOSIS — I714 Abdominal aortic aneurysm, without rupture: Secondary | ICD-10-CM

## 2013-03-05 ENCOUNTER — Other Ambulatory Visit: Payer: Self-pay

## 2013-03-07 ENCOUNTER — Ambulatory Visit
Admission: RE | Admit: 2013-03-07 | Discharge: 2013-03-07 | Disposition: A | Discharge status: Home / Self Care | Payer: Medicare Other | Source: Ambulatory Visit | Attending: Family Medicine | Admitting: Family Medicine

## 2013-03-07 DIAGNOSIS — I714 Abdominal aortic aneurysm, without rupture: Secondary | ICD-10-CM

## 2013-05-04 ENCOUNTER — Other Ambulatory Visit: Payer: Self-pay | Admitting: Cardiovascular Disease

## 2013-05-19 ENCOUNTER — Other Ambulatory Visit: Payer: Self-pay | Admitting: Family Medicine

## 2013-05-19 ENCOUNTER — Ambulatory Visit
Admission: RE | Admit: 2013-05-19 | Discharge: 2013-05-19 | Disposition: A | Discharge status: Home / Self Care | Payer: Medicare Other | Source: Ambulatory Visit | Attending: Family Medicine | Admitting: Family Medicine

## 2013-05-19 DIAGNOSIS — M79642 Pain in left hand: Secondary | ICD-10-CM

## 2013-05-19 DIAGNOSIS — R0602 Shortness of breath: Secondary | ICD-10-CM

## 2013-06-03 ENCOUNTER — Other Ambulatory Visit: Payer: Self-pay

## 2013-06-03 MED ORDER — ISOSORBIDE MONONITRATE ER 30 MG PO TB24: 30.0000 mg | ORAL_TABLET | Freq: Every day | ORAL | Status: DC

## 2013-06-03 MED ORDER — METOPROLOL TARTRATE 25 MG PO TABS: 25.0000 mg | ORAL_TABLET | Freq: Two times a day (BID) | ORAL | Status: DC

## 2013-06-05 ENCOUNTER — Other Ambulatory Visit: Payer: Self-pay

## 2013-06-23 ENCOUNTER — Encounter: Payer: Self-pay | Admitting: Cardiovascular Disease

## 2013-06-23 ENCOUNTER — Telehealth: Payer: Self-pay | Admitting: Cardiovascular Disease

## 2013-06-23 ENCOUNTER — Ambulatory Visit (INDEPENDENT_AMBULATORY_CARE_PROVIDER_SITE_OTHER): Payer: Medicare Other | Admitting: Cardiovascular Disease

## 2013-06-23 VITALS — BP 138/84 | HR 67 | Ht 66.0 in | Wt 185.8 lb

## 2013-06-23 DIAGNOSIS — R0989 Other specified symptoms and signs involving the circulatory and respiratory systems: Secondary | ICD-10-CM

## 2013-06-23 DIAGNOSIS — I2510 Atherosclerotic heart disease of native coronary artery without angina pectoris: Secondary | ICD-10-CM

## 2013-06-23 DIAGNOSIS — R0609 Other forms of dyspnea: Secondary | ICD-10-CM

## 2013-06-23 MED ORDER — VALSARTAN 80 MG PO TABS: 80.0000 mg | ORAL_TABLET | Freq: Every day | ORAL | Status: DC

## 2013-06-23 MED ORDER — ATORVASTATIN CALCIUM 40 MG PO TABS: 40.0000 mg | ORAL_TABLET | Freq: Every day | ORAL | Status: DC

## 2013-06-23 NOTE — Patient Instructions (Signed)
Your physician has requested that you have a lexiscan myoview.  Please follow instruction sheet, as given.  Your physician has requested that you have an echocardiogram. Echocardiography is a painless test that uses sound waves to create images of your heart. It provides your doctor with information about the size and shape of your heart and how well your heart's chambers and valves are working. This procedure takes approximately one hour. There are no restrictions for this procedure.  Your physician has recommended you make the following change in your medication:  Restart atorvastatin 40 mg one tablet each evening.  Your physician recommends that you return for lab work in: 3 months bmet lipid liver  Your physician recommends that you schedule a follow-up appointment in: 3 months with Dr Elease Hashimoto

## 2013-06-23 NOTE — Progress Notes (Signed)
Benjamin Harrison Date of Birth  05/10/1935 [facility]     [place] [facility]  [address]    [address]   [address] [place], [place]  [address]    [place], [place]  [address] [phone]  Fax  [phone]  [phone]  Fax [phone]   Problem list:  1. Coronary artery disease: Status post CABG in2001. He status post stenting of his mid right coronary artery in 10/17/2010 2. Hypertension 3. Hypothyroidism 4. Hyperlipidemia 5. Diabetes mellitus  History of Present Illness:  Benjamin Harrison is a 78 year old gentleman with a history of coronary artery disease. He status post PTCA and stenting of his right coronary. He had repeat stenting of his right coronary artery as recently as March, 2012.  He had another heart attack and had severe stenosis of his distal right coronary artery stent. Dr. [place] opened up the stent in March, 2013.  Saphenous vein graft to right coronary artery is occluded.  Since that time he's had persistent headaches and generalized weakness.   He's continued to have some episodes of chest pain.  He's also had some episodes of lightheadedness.  He's feeling a bit better since I saw him several months ago. He still is not able to exercise with about 15 or 20 minutes. Is clear that he still eats salty foods. He enjoys peanut butter on Ritz crackers almost every night. He also eats occasional country ham and gravy biscuits.  October 07, 2012  He is doing well.  He had a cath in November, 2013.  He has been on Imdur and is feeling better.    He occasionally has a headache.    Nov. 24, 2014:    Benjamin Harrison is having more dyspnea with exertion recently - walking outside, taking a shower.  He avoids salt.  He has not had much if any chest pain.    Current Outpatient Prescriptions on File Prior to Visit  Medication Sig Dispense Refill  . aspirin 81 MG tablet Take 1 tablet (81 mg total) by mouth daily.      . clopidogrel (PLAVIX) 75 MG tablet Take 1  tablet (75 mg total) by mouth daily.  30 tablet  5  . famotidine (PEPCID) 20 MG tablet Take 1 tablet (20 mg total) by mouth daily.  30 tablet    . hydrochlorothiazide (HYDRODIURIL) 25 MG tablet Take 1 tablet (25 mg total) by mouth daily.  90 tablet  3  . isosorbide mononitrate (IMDUR) 30 MG 24 hr tablet Take 1 tablet (30 mg total) by mouth daily.  30 tablet  6  . levothyroxine (SYNTHROID, LEVOTHROID) 100 MCG tablet Take 100 mcg by mouth daily.      . metoprolol tartrate (LOPRESSOR) 25 MG tablet Take 1 tablet (25 mg total) by mouth 2 (two) times daily.  60 tablet  5  . nitroGLYCERIN (NITROSTAT) 0.4 MG SL tablet Place 1 tablet (0.4 mg total) under the tongue every 5 (five) minutes as needed. For chest pain.      . valsartan (DIOVAN) 80 MG tablet Take 1 tablet (80 mg total) by mouth daily.  90 tablet  1   No current facility-administered medications on file prior to visit.    Allergies  Allergen Reactions  . Codeine     Makes pt Sick    Past Medical History  Diagnosis Date  . Coronary artery disease     a. CABG 2001 with early failure of VG-RCA. b. Inf MI after [employer]  01/28/2009 s/p RCA stent. c. Stent to dRCA 02/24/09. d. DES to dRCA & mRCA  2012. e. NSTEMI s/p angioplasty for ISR of RCA 09/2011. f. Severe distal diffuse dz likely cause of angina by cath 05/2012.  Marland Kitchen Hypertension   . Hypothyroidism   . Hyperlipidemia   . Personal history of tobacco use     REMOTE IN THE PAST  . Diabetes mellitus   . Dyslipidemia   . Osteoarthritis     End-stage osteoarthritis, right knee, medial compartment    Past Surgical History  Procedure Laterality Date  . Coronary artery bypass graft    . Cardiac catheterization    . Knee arthroscopy  1999 and 2004  . Hernia repair  1985    History  Smoking status  . Former Smoker  . Quit date: 08/01/1975  Smokeless tobacco  . Not on file    History  Alcohol Use No    Family History  Problem Relation Age of Onset  . Hypertension Mother   .  Cancer Sister     Throat Cancer  . Heart disease Brother   . Pneumonia Brother   . Heart disease Brother   . Cancer Brother     Prostate Cancer    Reviw of Systems:  Reviewed in the HPI.  All other systems are negative.  Physical Exam: BP 138/84  Pulse 67  Ht 5\' 6"  (1.676 m)  Wt 185 lb 12.8 oz (84.278 kg)  BMI 30.00 kg/m2 The patient is alert and oriented x 3.  The mood and affect are normal.   Skin: warm and dry.  Color is normal.    HEENT:   Normocephalic/atraumatic. His mucous membranes are moist. His neck is supple. Is no JVD. His carotids are normal.  Lungs: Lungs are clear. His back is nontender.   Heart: Regular rate S1-S2. He has no murmurs.    Abdomen: Has good bowel sounds. He is mildly obese. There is no hepatosplenomegaly.  Extremities:  No clubbing cyanosis or edema  Neuro:  Neuro exam is nonfocal. His gait is normal.    ECG: Nov. 24, 2014: - NSR at 67, low voltage    Assessment / Plan:

## 2013-06-23 NOTE — Assessment & Plan Note (Signed)
Benjamin Harrison has a history of coronary artery disease. He presents today with several months of progressive shortness of breath with exertion. He does not have significant chest pain.  Apply to get an echocardiogram to followup with his left ventricular function. Does have a history of diastolic dysfunction. We'll also get a stress Myoview study to evaluate him for the possibility of ischemia.  He has previously been on Crestor and also atorvastatin. When he went to fill his atorvastatin, the pharmacy said that the nurse did not refill his medication. He's not taken for the past month or so. We'll go ahead and restart his atorvastatin. We'll check labs again in 3 months. I'll see him again for followup office visit at that time.

## 2013-06-23 NOTE — Telephone Encounter (Signed)
PT is not to be on losartan currently.  meds reviewed/ pt verbalized understanding.

## 2013-06-23 NOTE — Telephone Encounter (Signed)
New problem    Pt has would like to take Diovan 80 mg he was also taking Losartan...  Pt needs a call back please about what he is and is not taking.

## 2013-07-08 ENCOUNTER — Encounter: Payer: Self-pay | Admitting: Cardiology

## 2013-07-08 ENCOUNTER — Ambulatory Visit (HOSPITAL_COMMUNITY): Payer: Medicare Other | Attending: Cardiology | Admitting: Cardiology

## 2013-07-08 DIAGNOSIS — E119 Type 2 diabetes mellitus without complications: Secondary | ICD-10-CM | POA: Insufficient documentation

## 2013-07-08 DIAGNOSIS — I10 Essential (primary) hypertension: Secondary | ICD-10-CM | POA: Insufficient documentation

## 2013-07-08 DIAGNOSIS — R0602 Shortness of breath: Secondary | ICD-10-CM

## 2013-07-08 DIAGNOSIS — R0609 Other forms of dyspnea: Secondary | ICD-10-CM | POA: Insufficient documentation

## 2013-07-08 DIAGNOSIS — I359 Nonrheumatic aortic valve disorder, unspecified: Secondary | ICD-10-CM | POA: Insufficient documentation

## 2013-07-08 DIAGNOSIS — I2510 Atherosclerotic heart disease of native coronary artery without angina pectoris: Secondary | ICD-10-CM | POA: Insufficient documentation

## 2013-07-08 DIAGNOSIS — I379 Nonrheumatic pulmonary valve disorder, unspecified: Secondary | ICD-10-CM | POA: Insufficient documentation

## 2013-07-08 DIAGNOSIS — I079 Rheumatic tricuspid valve disease, unspecified: Secondary | ICD-10-CM | POA: Insufficient documentation

## 2013-07-08 DIAGNOSIS — R0989 Other specified symptoms and signs involving the circulatory and respiratory systems: Secondary | ICD-10-CM | POA: Insufficient documentation

## 2013-07-08 DIAGNOSIS — Z87891 Personal history of nicotine dependence: Secondary | ICD-10-CM | POA: Insufficient documentation

## 2013-07-08 DIAGNOSIS — E785 Hyperlipidemia, unspecified: Secondary | ICD-10-CM | POA: Insufficient documentation

## 2013-07-08 NOTE — Progress Notes (Signed)
Echo performed. 

## 2013-07-09 ENCOUNTER — Other Ambulatory Visit: Payer: Self-pay | Admitting: Cardiovascular Disease

## 2013-07-11 ENCOUNTER — Other Ambulatory Visit: Payer: Self-pay | Admitting: Cardiovascular Disease

## 2013-07-11 ENCOUNTER — Ambulatory Visit (HOSPITAL_COMMUNITY): Payer: Medicare Other | Attending: Internal Medicine | Admitting: Radiology

## 2013-07-11 ENCOUNTER — Encounter: Payer: Self-pay | Admitting: Internal Medicine

## 2013-07-11 VITALS — BP 147/52 | HR 65 | Ht 66.0 in | Wt 184.0 lb

## 2013-07-11 DIAGNOSIS — I2510 Atherosclerotic heart disease of native coronary artery without angina pectoris: Secondary | ICD-10-CM | POA: Insufficient documentation

## 2013-07-11 DIAGNOSIS — R079 Chest pain, unspecified: Secondary | ICD-10-CM

## 2013-07-11 DIAGNOSIS — R0609 Other forms of dyspnea: Secondary | ICD-10-CM | POA: Insufficient documentation

## 2013-07-11 DIAGNOSIS — R0989 Other specified symptoms and signs involving the circulatory and respiratory systems: Secondary | ICD-10-CM | POA: Insufficient documentation

## 2013-07-11 MED ORDER — REGADENOSON 0.4 MG/5ML IV SOLN
0.4000 mg | Freq: Once | INTRAVENOUS | Status: AC
  Administered 2013-07-11: 0.4 mg via INTRAVENOUS

## 2013-07-11 MED ORDER — TECHNETIUM TC 99M SESTAMIBI GENERIC - CARDIOLITE
33.0000 | Freq: Once | INTRAVENOUS | Status: AC | PRN
  Administered 2013-07-11: 33 via INTRAVENOUS

## 2013-07-11 MED ORDER — TECHNETIUM TC 99M SESTAMIBI GENERIC - CARDIOLITE
11.0000 | Freq: Once | INTRAVENOUS | Status: AC | PRN
  Administered 2013-07-11: 11 via INTRAVENOUS

## 2013-07-11 NOTE — Progress Notes (Signed)
[facility] SITE 3 NUCLEAR MED [address] [place], [place] [address] [phone]    Cardiology Nuclear Med Study  Benjamin Harrison is a 78 y.o. male     MRN : [id number]     DOB: [birth year]  Procedure Date: 07/11/2013  Nuclear Med Background Indication for Stress Test:  Evaluation for Ischemia, Graft Patency and Stent Patency History:  CAD, MI, Cath 2013, Stent RCA, PTCA RCA 2013, Echo 2013 EF 50-55%, MPI 2010 (defect in inf. wall)  Cardiac Risk Factors: Family History - CAD, History of Smoking, Hypertension, Lipids and NIDDM  Symptoms:  Chest Pain (last date of chest discomfort was earlier today), DOE, Fatigue and Light-Headedness   Nuclear Pre-Procedure Caffeine/Decaff Intake:  None > 12 hrs NPO After: 5:30am   Lungs:  clear O2 Sat: 95% on room air. IV 0.9% NS with Angio Cath:  20g  IV Site: R Antecubital x 1, tolerated well IV Started by:  Irean Hong, RN  Chest Size (in):  44 Cup Size: n/a  Height: 5\' 6"  (1.676 m)  Weight:  184 lb (83.462 kg)  BMI:  Body mass index is 29.71 kg/(m^2). 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    Nuclear Med Study 1 or 2 day study: 1 day  Stress Test Type:  Lexiscan  Reading MD: Dietrich Pates, MD  Order Authorizing Provider:  Kristeen Miss, MD  Resting Radionuclide: Technetium 72m Sestamibi  Resting Radionuclide Dose: 11.0 mCi   Stress Radionuclide:  Technetium 26m Sestamibi  Stress Radionuclide Dose: 33.0 mCi           Stress Protocol Rest HR: 65 Stress HR: 100  Rest BP: 147/52 Stress BP: 170/79  Exercise Time (min): n/a METS: n/a           Dose of Adenosine (mg):  n/a Dose of Lexiscan: 0.4 mg  Dose of Atropine (mg): n/a Dose of Dobutamine: n/a mcg/kg/min (at max HR)  Stress Test Technologist: Nelson Chimes, BS-ES  Nuclear Technologist:  Dario Guardian, CNMT     Rest Procedure:  Myocardial perfusion imaging was performed at rest 45 minutes following the intravenous administration of  Technetium 49m Sestamibi. Rest ECG: NSR with non-specific ST-T wave changes  Stress Procedure:  The patient received IV Lexiscan 0.4 mg over 15-seconds.  Technetium 14m Sestamibi injected at 30-seconds.  Quantitative spect images were obtained after a 45 minute delay.  During the infusion of Lexiscan, the patient complained of SOB and feeling woozy.  These symptoms began to resolve in recovery.  Stress ECG: No significant change from baseline ECG  QPS Raw Data Images:  Stress images were motion corrected.  Soft tissue (diaphragm, bowel activity) underlie heart.   Stress Images: Thinning with decreased tracer activity in the inferior/inferolateral base and apex.  Otherwise normal perfusion.   Rest Images:  Comparison with the stress images reveals no significant change. Subtraction (SDS):  No evidence of ischemia. Transient Ischemic Dilatation (Normal <1.22):  0.80 Lung/Heart Ratio (Normal <0.45):  0.31  Quantitative Gated Spect Images QGS EDV:  68 ml QGS ESV:  29 ml  Impression Exercise Capacity:  Lexiscan with no exercise. BP Response:  Normal blood pressure response. Clinical Symptoms:  No chest pain. ECG Impression:  No significant ST segment change suggestive of ischemia. Comparison with Prior Nuclear Study:  No ischemia on previous scan  Overall Impression: Probable normal perfusion and mild soft tissue attenuation (diahphragm, bowel activity)  No signif ischemia or signif scar.  LV Ejection  Fraction: 58%.  LV Wall Motion:  NL LV Function; NL Wall Motion  Dietrich Pates

## 2013-07-15 ENCOUNTER — Telehealth: Payer: Self-pay | Admitting: Cardiovascular Disease

## 2013-07-15 NOTE — Telephone Encounter (Signed)
Follow Up ° °Pt returned call. Please call  °

## 2013-07-15 NOTE — Telephone Encounter (Signed)
PT WAS CALLED WITH RESULTS.

## 2013-09-17 ENCOUNTER — Other Ambulatory Visit (INDEPENDENT_AMBULATORY_CARE_PROVIDER_SITE_OTHER): Payer: Medicare Other

## 2013-09-17 DIAGNOSIS — R0609 Other forms of dyspnea: Secondary | ICD-10-CM

## 2013-09-17 DIAGNOSIS — R0989 Other specified symptoms and signs involving the circulatory and respiratory systems: Secondary | ICD-10-CM

## 2013-09-17 DIAGNOSIS — I2510 Atherosclerotic heart disease of native coronary artery without angina pectoris: Secondary | ICD-10-CM

## 2013-09-17 LAB — HEPATIC FUNCTION PANEL
ALBUMIN: 3.6 g/dL (ref 3.5–5.2)
ALT: 25 U/L (ref 0–53)
AST: 21 U/L (ref 0–37)
Alkaline Phosphatase: 75 U/L (ref 39–117)
Bilirubin, Direct: 0.1 mg/dL (ref 0.0–0.3)
Total Bilirubin: 1.3 mg/dL — ABNORMAL HIGH (ref 0.3–1.2)
Total Protein: 6.9 g/dL (ref 6.0–8.3)

## 2013-09-17 LAB — LIPID PANEL
Cholesterol: 111 mg/dL (ref 0–200)
HDL: 27.6 mg/dL — AB (ref >39.00)
LDL Cholesterol: 59 mg/dL (ref 0–99)
TRIGLYCERIDES: 122 mg/dL (ref 0.0–149.0)
Total CHOL/HDL Ratio: 4
VLDL: 24.4 mg/dL (ref 0.0–40.0)

## 2013-09-17 LAB — BASIC METABOLIC PANEL
BUN: 17 mg/dL (ref 6–23)
CO2: 30 mEq/L (ref 19–32)
CREATININE: 1.1 mg/dL (ref 0.4–1.5)
Calcium: 9.2 mg/dL (ref 8.4–10.5)
Chloride: 103 mEq/L (ref 96–112)
GFR: 68.32 mL/min (ref >60.00)
Glucose, Bld: 146 mg/dL — ABNORMAL HIGH (ref 70–99)
POTASSIUM: 3.8 meq/L (ref 3.5–5.1)
Sodium: 138 mEq/L (ref 135–145)

## 2013-09-18 ENCOUNTER — Other Ambulatory Visit: Payer: Medicare Other

## 2013-09-23 ENCOUNTER — Ambulatory Visit (INDEPENDENT_AMBULATORY_CARE_PROVIDER_SITE_OTHER): Payer: Medicare Other | Admitting: Cardiovascular Disease

## 2013-09-23 ENCOUNTER — Encounter: Payer: Self-pay | Admitting: Cardiovascular Disease

## 2013-09-23 VITALS — BP 122/82 | HR 64 | Ht 66.0 in | Wt 186.0 lb

## 2013-09-23 DIAGNOSIS — E785 Hyperlipidemia, unspecified: Secondary | ICD-10-CM

## 2013-09-23 DIAGNOSIS — I2510 Atherosclerotic heart disease of native coronary artery without angina pectoris: Secondary | ICD-10-CM

## 2013-09-23 NOTE — Assessment & Plan Note (Addendum)
Doing well.  No angina.  His most recent Myoview study in December was normal. His left ventricular systolic function is normal.  His shortness breath has resolved.  He still has occasional episodes of atypical chest discomfort. These episodes are not related to exercise. They do not feel like his previous episodes of angina.  Have recommended he try taking a nitroglycerin when he has these eighth: Episodes of chest pain.  If The nitroglycerin seems to help with the chest pain then he will call me. We're not entirely sure on what was causing the shortness breath but it seems to have resolved at this point. We will see him again in 6 months.

## 2013-09-23 NOTE — Assessment & Plan Note (Signed)
His lipid levels are well controlled.  His glucose is slightly elevated.   Have encouraged him to try to exercise. He is limited by his arthritis in his knees.

## 2013-09-23 NOTE — Patient Instructions (Signed)
Your physician wants you to follow-up in: 6 months You will receive a reminder letter in the mail two months in advance. If you don't receive a letter, please call our office to schedule the follow-up appointment.  Your physician recommends that you return for a FASTING lipid profile: 6 months   Your physician recommends that you continue on your current medications as directed. Please refer to the Current Medication list given to you today.

## 2013-09-23 NOTE — Progress Notes (Signed)
Benjamin Harrison Date of Birth  1935/06/28 [facility] [address]    [address]   [place], [place]  [address]    [place], [place]  [address] [phone]  Fax  [phone]  Fax [phone]   Problem list:  1. Coronary artery disease: Status post CABG in2001. He status post stenting of his mid right coronary artery in 10/17/2010 2. Hypertension 3. Hypothyroidism 4. Hyperlipidemia 5. Diabetes mellitus  History of Present Illness:  Benjamin Harrison is a 79 year old gentleman with a history of coronary artery disease. He status post PTCA and stenting of his right coronary. He had repeat stenting of his right coronary artery as recently as March, 2012.  He had another heart attack and had severe stenosis of his distal right coronary artery stent. Dr. [place] opened up the stent in March, 2013.  Saphenous vein graft to right coronary artery is occluded.  Since that time he's had persistent headaches and generalized weakness.   He's continued to have some episodes of chest pain.  He's also had some episodes of lightheadedness.  He's feeling a bit better since I saw him several months ago. He still is not able to exercise with about 15 or 20 minutes. Is clear that he still eats salty foods. He enjoys peanut butter on Ritz crackers almost every night. He also eats occasional country ham and gravy biscuits.  October 07, 2012  He is doing well.  He had a cath in November, 2013.  He has been on Imdur and is feeling better.    He occasionally has a headache.    Nov. 24, 2014:    Benjamin Harrison is having more dyspnea with exertion recently - walking outside, taking a shower.  He avoids salt.  He has not had much if any chest pain.    Feb. 24, 2015:  Benjamin Harrison is doing ok.  He was having some dyspnea but this has resolved.    Current Outpatient Prescriptions on File Prior to Visit  Medication Sig Dispense Refill  . aspirin 81 MG tablet Take  1 tablet (81 mg total) by mouth daily.      Marland Kitchen atorvastatin (LIPITOR) 40 MG tablet Take 1 tablet (40 mg total) by mouth daily.  90 tablet  3  . clopidogrel (PLAVIX) 75 MG tablet Take 1 tablet (75 mg total) by mouth daily.  30 tablet  5  . clopidogrel (PLAVIX) 75 MG tablet TAKE 1 TABLET (75 MG TOTAL) BY MOUTH DAILY.  30 tablet  2  . famotidine (PEPCID) 20 MG tablet Take 1 tablet (20 mg total) by mouth daily.  30 tablet    . glimepiride (AMARYL) 4 MG tablet Take 4 mg by mouth daily with breakfast.      . hydrochlorothiazide (HYDRODIURIL) 25 MG tablet Take 1 tablet (25 mg total) by mouth daily.  90 tablet  3  . isosorbide mononitrate (IMDUR) 30 MG 24 hr tablet Take 1 tablet (30 mg total) by mouth daily.  30 tablet  6  . levothyroxine (SYNTHROID, LEVOTHROID) 100 MCG tablet Take 100 mcg by mouth daily.      . metoprolol tartrate (LOPRESSOR) 25 MG tablet Take 1 tablet (25 mg total) by mouth 2 (two) times daily.  60 tablet  5  . nitroGLYCERIN (NITROSTAT) 0.4 MG SL tablet Place 1 tablet (0.4 mg total) under the tongue every 5 (five) minutes as needed. For chest pain.      Marland Kitchen  NITROSTAT 0.4 MG SL tablet PLACE 1 TABLET (0.4 MG TOTAL) UNDER THE TONGUE EVERY 5 (FIVE) MINUTES AS NEEDED. FOR CHEST PAIN.  100 tablet  0  . valsartan (DIOVAN) 80 MG tablet Take 1 tablet (80 mg total) by mouth daily.  90 tablet  1   No current facility-administered medications on file prior to visit.    Allergies  Allergen Reactions  . Codeine     Makes pt Sick    Past Medical History  Diagnosis Date  . Coronary artery disease     a. CABG 2001 with early failure of VG-RCA. b. Inf MI after Lexiscan 01/28/2009 s/p RCA stent. c. Stent to dRCA 02/24/09. d. DES to [place]  2012. e. NSTEMI s/p angioplasty for ISR of RCA 09/2011. f. Severe distal diffuse dz likely cause of angina by cath 05/2012.  Marland Kitchen Hypertension   . Hypothyroidism   . Hyperlipidemia   . Personal history of tobacco use     REMOTE IN THE PAST  . Diabetes mellitus     . Dyslipidemia   . Osteoarthritis     End-stage osteoarthritis, right knee, medial compartment    Past Surgical History  Procedure Laterality Date  . Coronary artery bypass graft    . Cardiac catheterization    . Knee arthroscopy  1999 and 2004  . Hernia repair  1985    History  Smoking status  . Former Smoker  . Quit date: 08/01/1975  Smokeless tobacco  . Not on file    History  Alcohol Use No    Family History  Problem Relation Age of Onset  . Hypertension Mother   . Cancer Sister     Throat Cancer  . Heart disease Brother   . Pneumonia Brother   . Heart disease Brother   . Cancer Brother     Prostate Cancer    Reviw of Systems:  Reviewed in the HPI.  All other systems are negative.  Physical Exam: BP 122/82  Pulse 64  Ht 5\' 6"  (1.676 m)  Wt 186 lb (84.369 kg)  BMI 30.04 kg/m2  SpO2 97% The patient is alert and oriented x 3.  The mood and affect are normal.   Skin: warm and dry.  Color is normal.    HEENT:   Normocephalic/atraumatic. His mucous membranes are moist. His neck is supple. Is no JVD. His carotids are normal.  Lungs: Lungs are clear. His back is nontender.   Heart: Regular rate S1-S2. He has no murmurs.    Abdomen: Has good bowel sounds. He is mildly obese. There is no hepatosplenomegaly.  Extremities:  No clubbing cyanosis or edema  Neuro:  Neuro exam is nonfocal. His gait is normal.    ECG  Assessment / Plan:

## 2013-10-10 ENCOUNTER — Other Ambulatory Visit: Payer: Self-pay | Admitting: Cardiovascular Disease

## 2013-11-03 ENCOUNTER — Encounter (HOSPITAL_COMMUNITY): Payer: Self-pay | Admitting: Emergency Medicine

## 2013-11-03 ENCOUNTER — Emergency Department (HOSPITAL_COMMUNITY)
Admission: EM | Admit: 2013-11-03 | Discharge: 2013-11-03 | Disposition: A | Discharge status: Home / Self Care | Payer: Medicare Other | Source: Home / Self Care | Attending: Family Medicine | Admitting: Family Medicine

## 2013-11-03 ENCOUNTER — Emergency Department (HOSPITAL_COMMUNITY): Payer: Medicare Other

## 2013-11-03 ENCOUNTER — Emergency Department (HOSPITAL_COMMUNITY)
Admission: EM | Admit: 2013-11-03 | Discharge: 2013-11-04 | Disposition: A | Discharge status: Home / Self Care | Payer: Medicare Other | Attending: Emergency Medicine | Admitting: Emergency Medicine

## 2013-11-03 DIAGNOSIS — Z7982 Long term (current) use of aspirin: Secondary | ICD-10-CM | POA: Insufficient documentation

## 2013-11-03 DIAGNOSIS — E039 Hypothyroidism, unspecified: Secondary | ICD-10-CM | POA: Insufficient documentation

## 2013-11-03 DIAGNOSIS — Z951 Presence of aortocoronary bypass graft: Secondary | ICD-10-CM | POA: Insufficient documentation

## 2013-11-03 DIAGNOSIS — IMO0002 Reserved for concepts with insufficient information to code with codable children: Secondary | ICD-10-CM | POA: Insufficient documentation

## 2013-11-03 DIAGNOSIS — I10 Essential (primary) hypertension: Secondary | ICD-10-CM | POA: Insufficient documentation

## 2013-11-03 DIAGNOSIS — K5289 Other specified noninfective gastroenteritis and colitis: Secondary | ICD-10-CM | POA: Insufficient documentation

## 2013-11-03 DIAGNOSIS — Z7902 Long term (current) use of antithrombotics/antiplatelets: Secondary | ICD-10-CM | POA: Insufficient documentation

## 2013-11-03 DIAGNOSIS — I2510 Atherosclerotic heart disease of native coronary artery without angina pectoris: Secondary | ICD-10-CM | POA: Insufficient documentation

## 2013-11-03 DIAGNOSIS — K529 Noninfective gastroenteritis and colitis, unspecified: Secondary | ICD-10-CM

## 2013-11-03 DIAGNOSIS — M1710 Unilateral primary osteoarthritis, unspecified knee: Secondary | ICD-10-CM | POA: Insufficient documentation

## 2013-11-03 DIAGNOSIS — Z9889 Other specified postprocedural states: Secondary | ICD-10-CM | POA: Insufficient documentation

## 2013-11-03 DIAGNOSIS — Z79899 Other long term (current) drug therapy: Secondary | ICD-10-CM | POA: Insufficient documentation

## 2013-11-03 DIAGNOSIS — E860 Dehydration: Secondary | ICD-10-CM

## 2013-11-03 DIAGNOSIS — Z87891 Personal history of nicotine dependence: Secondary | ICD-10-CM | POA: Insufficient documentation

## 2013-11-03 DIAGNOSIS — E119 Type 2 diabetes mellitus without complications: Secondary | ICD-10-CM | POA: Insufficient documentation

## 2013-11-03 DIAGNOSIS — E785 Hyperlipidemia, unspecified: Secondary | ICD-10-CM | POA: Insufficient documentation

## 2013-11-03 LAB — COMPREHENSIVE METABOLIC PANEL
ALT: 22 U/L (ref 0–53)
AST: 16 U/L (ref 0–37)
Albumin: 3.4 g/dL — ABNORMAL LOW (ref 3.5–5.2)
Alkaline Phosphatase: 64 U/L (ref 39–117)
BUN: 16 mg/dL (ref 6–23)
CALCIUM: 8.8 mg/dL (ref 8.4–10.5)
CO2: 26 meq/L (ref 19–32)
CREATININE: 0.88 mg/dL (ref 0.50–1.35)
Chloride: 101 mEq/L (ref 96–112)
GFR, EST NON AFRICAN AMERICAN: 81 mL/min — AB (ref >90)
GLUCOSE: 221 mg/dL — AB (ref 70–99)
Potassium: 3.6 mEq/L — ABNORMAL LOW (ref 3.7–5.3)
Sodium: 141 mEq/L (ref 137–147)
Total Bilirubin: 1.6 mg/dL — ABNORMAL HIGH (ref 0.3–1.2)
Total Protein: 6.7 g/dL (ref 6.0–8.3)

## 2013-11-03 LAB — POCT I-STAT, CHEM 8
BUN: 19 mg/dL (ref 6–23)
CHLORIDE: 99 meq/L (ref 96–112)
CREATININE: 1.1 mg/dL (ref 0.50–1.35)
Calcium, Ion: 1.17 mmol/L (ref 1.13–1.30)
GLUCOSE: 228 mg/dL — AB (ref 70–99)
HCT: 49 % (ref 39.0–52.0)
Hemoglobin: 16.7 g/dL (ref 13.0–17.0)
POTASSIUM: 3.6 meq/L — AB (ref 3.7–5.3)
Sodium: 139 mEq/L (ref 137–147)
TCO2: 26 mmol/L (ref 0–100)

## 2013-11-03 LAB — CBC WITH DIFFERENTIAL/PLATELET
Basophils Absolute: 0 10*3/uL (ref 0.0–0.1)
Basophils Relative: 0 % (ref 0–1)
EOS PCT: 2 % (ref 0–5)
Eosinophils Absolute: 0.1 10*3/uL (ref 0.0–0.7)
HEMATOCRIT: 44.1 % (ref 39.0–52.0)
Hemoglobin: 15.6 g/dL (ref 13.0–17.0)
LYMPHS ABS: 0.9 10*3/uL (ref 0.7–4.0)
LYMPHS PCT: 17 % (ref 12–46)
MCH: 26.8 pg (ref 26.0–34.0)
MCHC: 35.4 g/dL (ref 30.0–36.0)
MCV: 75.8 fL — AB (ref 78.0–100.0)
MONO ABS: 0.9 10*3/uL (ref 0.1–1.0)
Monocytes Relative: 18 % — ABNORMAL HIGH (ref 3–12)
Neutro Abs: 3.3 10*3/uL (ref 1.7–7.7)
Neutrophils Relative %: 63 % (ref 43–77)
Platelets: 186 10*3/uL (ref 150–400)
RBC: 5.82 MIL/uL — AB (ref 4.22–5.81)
RDW: 14.9 % (ref 11.5–15.5)
WBC: 5.2 10*3/uL (ref 4.0–10.5)

## 2013-11-03 LAB — PRO B NATRIURETIC PEPTIDE: Pro B Natriuretic peptide (BNP): 175.4 pg/mL (ref 0–450)

## 2013-11-03 LAB — CBG MONITORING, ED: Glucose-Capillary: 352 mg/dL — ABNORMAL HIGH (ref 70–99)

## 2013-11-03 MED ORDER — ONDANSETRON HCL 4 MG/2ML IJ SOLN
INTRAMUSCULAR | Status: AC
  Filled 2013-11-03: qty 2

## 2013-11-03 MED ORDER — SODIUM CHLORIDE 0.9 % IV BOLUS (SEPSIS)
250.0000 mL | Freq: Once | INTRAVENOUS | Status: AC
  Administered 2013-11-03: 250 mL via INTRAVENOUS

## 2013-11-03 MED ORDER — ONDANSETRON HCL 4 MG/2ML IJ SOLN
4.0000 mg | Freq: Once | INTRAMUSCULAR | Status: AC
  Administered 2013-11-03: 4 mg via INTRAMUSCULAR

## 2013-11-03 MED ORDER — ONDANSETRON HCL 4 MG/2ML IJ SOLN
4.0000 mg | Freq: Once | INTRAMUSCULAR | Status: AC
  Administered 2013-11-03: 4 mg via INTRAVENOUS
  Filled 2013-11-03: qty 2

## 2013-11-03 MED ORDER — SODIUM CHLORIDE 0.9 % IV SOLN
INTRAVENOUS | Status: DC
  Administered 2013-11-03: 20:00:00 via INTRAVENOUS

## 2013-11-03 MED ORDER — DEXTROSE 5 % AND 0.45 % NACL IV BOLUS
1000.0000 mL | Freq: Once | INTRAVENOUS | Status: DC
Start: 2013-11-03 — End: 2013-11-03
  Administered 2013-11-03: 1000 mL via INTRAVENOUS

## 2013-11-03 NOTE — ED Provider Notes (Addendum)
CSN: [id number]     Arrival date & time 11/03/13  1624 History   First MD Initiated Contact with Patient 11/03/13 1727     Chief Complaint  Patient presents with  . Diarrhea  . Emesis     (Consider location/radiation/quality/duration/timing/severity/associated sxs/prior Treatment) Patient is a 79 y.o. male presenting with diarrhea and vomiting. The history is provided by the patient and the spouse.  Diarrhea Associated symptoms: vomiting   Associated symptoms: no abdominal pain, no fever and no headaches   Emesis Associated symptoms: diarrhea   Associated symptoms: no abdominal pain and no headaches    is patient with onset of vomiting and diarrhea illness. Diarrhea started the on Saturday. Said 2 episodes today vomiting started yesterday said 78 episodes of vomiting today. Patient was originally at urgent care and referred over here. No chest pain no shortness of breath. Chest with persistent vomiting.  Past Medical History  Diagnosis Date  . Coronary artery disease     a. CABG 2001 with early failure of VG-RCA. b. Inf MI after Lexiscan 01/28/2009 s/p RCA stent. c. Stent to dRCA 02/24/09. d. DES to [place]  2012. e. NSTEMI s/p angioplasty for ISR of RCA 09/2011. f. Severe distal diffuse dz likely cause of angina by cath 05/2012.  Marland Kitchen Hypertension   . Hypothyroidism   . Hyperlipidemia   . Personal history of tobacco use     REMOTE IN THE PAST  . Diabetes mellitus   . Dyslipidemia   . Osteoarthritis     End-stage osteoarthritis, right knee, medial compartment   Past Surgical History  Procedure Laterality Date  . Coronary artery bypass graft    . Cardiac catheterization    . Knee arthroscopy  1999 and 2004  . Hernia repair  1985   Family History  Problem Relation Age of Onset  . Hypertension Mother   . Cancer Sister     Throat Cancer  . Heart disease Brother   . Pneumonia Brother   . Heart disease Brother   . Cancer Brother     Prostate Cancer   History  Substance Use  Topics  . Smoking status: Former Smoker    Quit date: 08/01/1975  . Smokeless tobacco: Not on file  . Alcohol Use: No    Review of Systems  Constitutional: Positive for fatigue. Negative for fever.  HENT: Negative for congestion.   Eyes: Negative for redness.  Respiratory: Negative for shortness of breath.   Cardiovascular: Negative for chest pain.  Gastrointestinal: Positive for nausea, vomiting and diarrhea. Negative for abdominal pain.  Genitourinary: Negative for dysuria.  Musculoskeletal: Negative for back pain.  Skin: Negative for rash.  Neurological: Negative for headaches.  Hematological: Does not bruise/bleed easily.  Psychiatric/Behavioral: Negative for confusion.      Allergies  Codeine  Home Medications   Current Outpatient Rx  Name  Route  Sig  Dispense  Refill  . aspirin 81 MG tablet   Oral   Take 1 tablet (81 mg total) by mouth daily.         Marland Kitchen atorvastatin (LIPITOR) 40 MG tablet   Oral   Take 1 tablet (40 mg total) by mouth daily.   90 tablet   3   . clopidogrel (PLAVIX) 75 MG tablet   Oral   Take 1 tablet (75 mg total) by mouth daily.   30 tablet   5   . cyanocobalamin 500 MCG tablet   Oral   Take 500 mcg by mouth  daily.         . Esomeprazole Magnesium (NEXIUM PO)   Oral   Take 22.3 mg by mouth daily.         Marland Kitchen glimepiride (AMARYL) 4 MG tablet   Oral   Take 4 mg by mouth daily with breakfast.         . hydrochlorothiazide (HYDRODIURIL) 25 MG tablet   Oral   Take 1 tablet (25 mg total) by mouth daily.   90 tablet   3   . isosorbide mononitrate (IMDUR) 30 MG 24 hr tablet   Oral   Take 1 tablet (30 mg total) by mouth daily.   30 tablet   6   . levothyroxine (SYNTHROID, LEVOTHROID) 100 MCG tablet   Oral   Take 100 mcg by mouth daily.         . metoprolol tartrate (LOPRESSOR) 25 MG tablet   Oral   Take 1 tablet (25 mg total) by mouth 2 (two) times daily.   60 tablet   5   . nitroGLYCERIN (NITROSTAT) 0.4 MG SL  tablet   Sublingual   Place 1 tablet (0.4 mg total) under the tongue every 5 (five) minutes as needed. For chest pain.         . valsartan (DIOVAN) 80 MG tablet   Oral   Take 1 tablet (80 mg total) by mouth daily.   90 tablet   1   . ondansetron (ZOFRAN ODT) 4 MG disintegrating tablet   Oral   Take 1 tablet (4 mg total) by mouth every 8 (eight) hours as needed for nausea or vomiting.   12 tablet   1    BP 119/64  Pulse 102  Temp(Src) 98.7 F (37.1 C) (Oral)  Resp 20  Ht 5\' 6"  (1.676 m)  Wt 186 lb (84.369 kg)  BMI 30.04 kg/m2  SpO2 95% Physical Exam  Nursing note and vitals reviewed. Constitutional: He is oriented to person, place, and time. He appears well-developed and well-nourished. No distress.  HENT:  Head: Normocephalic and atraumatic.  Mouth/Throat: Oropharynx is clear and moist.  Eyes: Conjunctivae and EOM are normal. Pupils are equal, round, and reactive to light.  Neck: Normal range of motion.  Cardiovascular: Normal rate, regular rhythm and normal heart sounds.   Pulmonary/Chest: Effort normal and breath sounds normal. He exhibits no tenderness.  Abdominal: Soft. Bowel sounds are normal. There is no tenderness.  Musculoskeletal: Normal range of motion. He exhibits no edema.  Neurological: He is alert and oriented to person, place, and time. No cranial nerve deficit. He exhibits normal muscle tone. Coordination normal.  Skin: No rash noted. No erythema.    ED Course  Procedures (including critical care time) Labs Review Labs Reviewed  CBC WITH DIFFERENTIAL - Abnormal; Notable for the following:    RBC 5.82 (*)    MCV 75.8 (*)    Monocytes Relative 18 (*)    All other components within normal limits  COMPREHENSIVE METABOLIC PANEL - Abnormal; Notable for the following:    Potassium 3.6 (*)    Glucose, Bld 221 (*)    Albumin 3.4 (*)    Total Bilirubin 1.6 (*)    GFR calc non Af Amer 81 (*)    All other components within normal limits  CBG MONITORING,  ED - Abnormal; Notable for the following:    Glucose-Capillary 352 (*)    All other components within normal limits  PRO B NATRIURETIC PEPTIDE   Results for orders  placed during the hospital encounter of 11/03/13  PRO B NATRIURETIC PEPTIDE      Result Value Ref Range   Pro B Natriuretic peptide (BNP) 175.4  0 - 450 pg/mL  CBC WITH DIFFERENTIAL      Result Value Ref Range   WBC 5.2  4.0 - 10.5 K/uL   RBC 5.82 (*) 4.22 - 5.81 MIL/uL   Hemoglobin 15.6  13.0 - 17.0 g/dL   HCT 44.1  39.0 - 52.0 %   MCV 75.8 (*) 78.0 - 100.0 fL   MCH 26.8  26.0 - 34.0 pg   MCHC 35.4  30.0 - 36.0 g/dL   RDW 14.9  11.5 - 15.5 %   Platelets 186  150 - 400 K/uL   Neutrophils Relative % 63  43 - 77 %   Neutro Abs 3.3  1.7 - 7.7 K/uL   Lymphocytes Relative 17  12 - 46 %   Lymphs Abs 0.9  0.7 - 4.0 K/uL   Monocytes Relative 18 (*) 3 - 12 %   Monocytes Absolute 0.9  0.1 - 1.0 K/uL   Eosinophils Relative 2  0 - 5 %   Eosinophils Absolute 0.1  0.0 - 0.7 K/uL   Basophils Relative 0  0 - 1 %   Basophils Absolute 0.0  0.0 - 0.1 K/uL  COMPREHENSIVE METABOLIC PANEL      Result Value Ref Range   Sodium 141  137 - 147 mEq/L   Potassium 3.6 (*) 3.7 - 5.3 mEq/L   Chloride 101  96 - 112 mEq/L   CO2 26  19 - 32 mEq/L   Glucose, Bld 221 (*) 70 - 99 mg/dL   BUN 16  6 - 23 mg/dL   Creatinine, Ser 0.88  0.50 - 1.35 mg/dL   Calcium 8.8  8.4 - 10.5 mg/dL   Total Protein 6.7  6.0 - 8.3 g/dL   Albumin 3.4 (*) 3.5 - 5.2 g/dL   AST 16  0 - 37 U/L   ALT 22  0 - 53 U/L   Alkaline Phosphatase 64  39 - 117 U/L   Total Bilirubin 1.6 (*) 0.3 - 1.2 mg/dL   GFR calc non Af Amer 81 (*) >90 mL/min   GFR calc Af Amer >90  >90 mL/min  CBG MONITORING, ED      Result Value Ref Range   Glucose-Capillary 352 (*) 70 - 99 mg/dL    Imaging Review Dg Chest 2 View  11/04/2013   CLINICAL DATA:  Vomiting and diarrhea for 3 days.  EXAM: CHEST  2 VIEW  COMPARISON:  Chest radiograph performed 05/19/2013  FINDINGS: The lungs are well-aerated.  Mild left basilar airspace opacity likely reflects atelectasis. Pulmonary vascularity is at the upper limits of normal. There is no evidence of pleural effusion or pneumothorax.  The heart is borderline normal in size. The patient is status post median sternotomy. No acute osseous abnormalities are seen. No free intra-abdominal air is identified under the diaphragm.  IMPRESSION: Mild left basilar airspace opacity likely reflects atelectasis. Lungs otherwise grossly clear.   Electronically Signed   By: Garald Balding M.D.   On: 11/04/2013 00:02     EKG Interpretation   Date/Time:  Monday November 03 2013 16:33:36 EDT Ventricular Rate:  90 PR Interval:  169 QRS Duration: 83 QT Interval:  430 QTC Calculation: 526 R Axis:   -16 Text Interpretation:  Sinus rhythm Inferior infarct, old Consider anterior  infarct Prolonged QT interval No significant  change since last tracing  Confirmed by Zekiel Torian  MD, [place] [phone]) on 11/03/2013 4:42:57 PM      MDM   Final diagnoses:  Gastroenteritis    Chest x-ray results are still pending. However patient's labs otherwise are quite remarkable considering the duration of nausea vomiting and diarrhea. Patient has had vomiting since just her day and diarrhea since Saturday. No significant electrolyte abnormalities. Patient initially had elevated blood sugar but improved here now down to 200 range. No significant leukocytosis. BMP without significant elevation EKG without acute changes. Patient never had any chest pain. Chest x-ray is pending but if normal patient to be discharged home. No evidence of any acidosis.     Mervin Kung, MD 11/03/13 2334  Chest x-ray without significant findings. We'll discharge patient.  Mervin Kung, MD 11/04/13 0010

## 2013-11-03 NOTE — ED Notes (Signed)
Patient reports needing prescription for glucose test strips, he is out of these at home.

## 2013-11-03 NOTE — ED Notes (Signed)
Patient transported to X-ray 

## 2013-11-03 NOTE — ED Notes (Signed)
Pt reports experiencing diarrhea for 7 days and vomiting for 1 day.  Urgent Care transferred him here for further evaluation due to his cardiac history

## 2013-11-03 NOTE — ED Notes (Signed)
Report to [facility] for transfer

## 2013-11-03 NOTE — ED Provider Notes (Signed)
CSN: [id number]     Arrival date & time 11/03/13  1234 History   First MD Initiated Contact with Patient 11/03/13 1510     Chief Complaint  Patient presents with  . Emesis   (Consider location/radiation/quality/duration/timing/severity/associated sxs/prior Treatment) Patient is a 79 y.o. male presenting with vomiting. The history is provided by the patient and the spouse.  Emesis Severity:  Moderate Timing:  Intermittent Quality:  Stomach contents Progression:  Unchanged Chronicity:  New Recent urination:  Decreased Context comment:  Diarrhea since sat, vomiting yest, episode prior to visit, also sob developed today. no blood from gi  Associated symptoms: diarrhea     Past Medical History  Diagnosis Date  . Coronary artery disease     a. CABG 2001 with early failure of VG-RCA. b. Inf MI after Lexiscan 01/28/2009 s/p RCA stent. c. Stent to dRCA 02/24/09. d. DES to [place]  2012. e. NSTEMI s/p angioplasty for ISR of RCA 09/2011. f. Severe distal diffuse dz likely cause of angina by cath 05/2012.  Marland Kitchen Hypertension   . Hypothyroidism   . Hyperlipidemia   . Personal history of tobacco use     REMOTE IN THE PAST  . Diabetes mellitus   . Dyslipidemia   . Osteoarthritis     End-stage osteoarthritis, right knee, medial compartment   Past Surgical History  Procedure Laterality Date  . Coronary artery bypass graft    . Cardiac catheterization    . Knee arthroscopy  1999 and 2004  . Hernia repair  1985   Family History  Problem Relation Age of Onset  . Hypertension Mother   . Cancer Sister     Throat Cancer  . Heart disease Brother   . Pneumonia Brother   . Heart disease Brother   . Cancer Brother     Prostate Cancer   History  Substance Use Topics  . Smoking status: Former Smoker    Quit date: 08/01/1975  . Smokeless tobacco: Not on file  . Alcohol Use: No    Review of Systems  Constitutional: Positive for activity change and appetite change.  Gastrointestinal:  Positive for nausea, vomiting, diarrhea and abdominal distention. Negative for blood in stool.  Genitourinary: Positive for decreased urine volume.  Neurological: Positive for weakness.    Allergies  Codeine  Home Medications   Current Outpatient Rx  Name  Route  Sig  Dispense  Refill  . aspirin 81 MG tablet   Oral   Take 1 tablet (81 mg total) by mouth daily.         Marland Kitchen atorvastatin (LIPITOR) 40 MG tablet   Oral   Take 1 tablet (40 mg total) by mouth daily.   90 tablet   3   . clopidogrel (PLAVIX) 75 MG tablet   Oral   Take 1 tablet (75 mg total) by mouth daily.   30 tablet   5   . cyanocobalamin 500 MCG tablet   Oral   Take 500 mcg by mouth daily.         . Esomeprazole Magnesium (NEXIUM PO)   Oral   Take 22.3 mg by mouth daily.         Marland Kitchen glimepiride (AMARYL) 4 MG tablet   Oral   Take 4 mg by mouth daily with breakfast.         . hydrochlorothiazide (HYDRODIURIL) 25 MG tablet   Oral   Take 1 tablet (25 mg total) by mouth daily.   90 tablet  3   . isosorbide mononitrate (IMDUR) 30 MG 24 hr tablet   Oral   Take 1 tablet (30 mg total) by mouth daily.   30 tablet   6   . levothyroxine (SYNTHROID, LEVOTHROID) 100 MCG tablet   Oral   Take 100 mcg by mouth daily.         . metoprolol tartrate (LOPRESSOR) 25 MG tablet   Oral   Take 1 tablet (25 mg total) by mouth 2 (two) times daily.   60 tablet   5   . nitroGLYCERIN (NITROSTAT) 0.4 MG SL tablet   Sublingual   Place 1 tablet (0.4 mg total) under the tongue every 5 (five) minutes as needed. For chest pain.         . valsartan (DIOVAN) 80 MG tablet   Oral   Take 1 tablet (80 mg total) by mouth daily.   90 tablet   1    BP 153/81  Pulse 108  Temp(Src) 98 F (36.7 C) (Oral)  Resp 20  SpO2 98% Physical Exam  Nursing note and vitals reviewed. Constitutional: He is oriented to person, place, and time. He appears well-developed and well-nourished.  HENT:  Mouth/Throat: Oropharynx is  clear and moist.  Eyes: Pupils are equal, round, and reactive to light.  Neck: Normal range of motion. Neck supple.  Cardiovascular: Normal heart sounds and intact distal pulses.   Pulmonary/Chest: Effort normal and breath sounds normal.  Abdominal: Soft. He exhibits distension. He exhibits no mass. Bowel sounds are decreased. There is no hepatosplenomegaly. There is generalized tenderness. There is no rebound, no guarding and no CVA tenderness.  Lymphadenopathy:    He has no cervical adenopathy.  Neurological: He is alert and oriented to person, place, and time.  Skin: Skin is warm and dry.    ED Course  Procedures (including critical care time) Labs Review Labs Reviewed  POCT I-STAT, CHEM 8 - Abnormal; Notable for the following:    Potassium 3.6 (*)    Glucose, Bld 228 (*)    All other components within normal limits   Imaging Review No results found.   MDM   1. Dehydration, mild    Sent for ivf from gastro but also cardiac hx and sob today and weak with decreased urine output.    Billy Fischer, MD 11/03/13 2115

## 2013-11-03 NOTE — ED Notes (Signed)
Voided for the first time today.

## 2013-11-03 NOTE — ED Notes (Signed)
Vomiting since yesterday, dizzy

## 2013-11-04 MED ORDER — ONDANSETRON HCL 4 MG/2ML IJ SOLN
4.0000 mg | Freq: Once | INTRAMUSCULAR | Status: AC
  Administered 2013-11-04: 4 mg via INTRAVENOUS
  Filled 2013-11-04: qty 2

## 2013-11-04 MED ORDER — ONDANSETRON 4 MG PO TBDP: 4.0000 mg | ORAL_TABLET | Freq: Three times a day (TID) | ORAL | Status: DC | PRN

## 2013-11-04 NOTE — Discharge Instructions (Signed)
Diet for Diarrhea, Adult Frequent, runny stools (diarrhea) may be caused or worsened by food or drink. Diarrhea may be relieved by changing your diet. Since diarrhea can last up to 7 days, it is easy for you to lose too much fluid from the body and become dehydrated. Fluids that are lost need to be replaced. Along with a modified diet, make sure you drink enough fluids to keep your urine clear or pale yellow. DIET INSTRUCTIONS  Ensure adequate fluid intake (hydration): have 1 cup (8 oz) of fluid for each diarrhea episode. Avoid fluids that contain simple sugars or sports drinks, fruit juices, whole milk products, and sodas. Your urine should be clear or pale yellow if you are drinking enough fluids. Hydrate with an oral rehydration solution that you can purchase at pharmacies, retail stores, and online. You can prepare an oral rehydration solution at home by mixing the following ingredients together:    tsp table salt.   tsp baking soda.   tsp salt substitute containing potassium chloride.  1  tablespoons sugar.  1 L (34 oz) of water.  Certain foods and beverages may increase the speed at which food moves through the gastrointestinal (GI) tract. These foods and beverages should be avoided and include:  Caffeinated and alcoholic beverages.  High-fiber foods, such as raw fruits and vegetables, nuts, seeds, and whole grain breads and cereals.  Foods and beverages sweetened with sugar alcohols, such as xylitol, sorbitol, and mannitol.  Some foods may be well tolerated and may help thicken stool including:  Starchy foods, such as rice, toast, pasta, low-sugar cereal, oatmeal, grits, baked potatoes, crackers, and bagels.   Bananas.   Applesauce.  Add probiotic-rich foods to help increase healthy bacteria in the GI tract, such as yogurt and fermented milk products. RECOMMENDED FOODS AND BEVERAGES Starches Choose foods with less than 2 g of fiber per serving.  Recommended:  White,  [place], and pita breads, plain rolls, buns, bagels. Plain muffins, matzo. Soda, saltine, or graham crackers. Pretzels, melba toast, zwieback. Cooked cereals made with water: cornmeal, farina, cream cereals. Dry cereals: refined corn, wheat, rice. Potatoes prepared any way without skins, refined macaroni, spaghetti, noodles, refined rice.  Avoid:  Bread, rolls, or crackers made with whole wheat, multi-grains, rye, bran seeds, nuts, or coconut. Corn tortillas or taco shells. Cereals containing whole grains, multi-grains, bran, coconut, nuts, raisins. Cooked or dry oatmeal. Coarse wheat cereals, granola. Cereals advertised as "high-fiber." Potato skins. Whole grain pasta, wild or brown rice. Popcorn. Sweet potatoes, yams. Sweet rolls, doughnuts, waffles, pancakes, sweet breads. Vegetables  Recommended: Strained tomato and vegetable juices. Most well-cooked and canned vegetables without seeds. Fresh: Tender lettuce, cucumber without the skin, cabbage, spinach, bean sprouts.  Avoid: Fresh, cooked, or canned: Artichokes, baked beans, beet greens, broccoli, Brussels sprouts, corn, kale, legumes, peas, sweet potatoes. Cooked: Green or red cabbage, spinach. Avoid large servings of any vegetables because vegetables shrink when cooked, and they contain more fiber per serving than fresh vegetables. Fruit  Recommended: Cooked or canned: Apricots, applesauce, cantaloupe, cherries, fruit cocktail, grapefruit, grapes, kiwi, mandarin oranges, peaches, pears, plums, watermelon. Fresh: Apples without skin, ripe banana, grapes, cantaloupe, cherries, grapefruit, peaches, oranges, plums. Keep servings limited to  cup or 1 piece.  Avoid: Fresh: Apples with skin, apricots, mangoes, pears, raspberries, strawberries. Prune juice, stewed or dried prunes. Dried fruits, raisins, dates. Large servings of all fresh fruits. Protein  Recommended: Ground or well-cooked tender beef, ham, veal, lamb, pork, or poultry. Eggs. Fish,  oysters, shrimp,  lobster, other seafoods. Liver, organ meats.  Avoid: Tough, fibrous meats with gristle. Peanut butter, smooth or chunky. Cheese, nuts, seeds, legumes, dried peas, beans, lentils. Dairy  Recommended: Yogurt, lactose-free milk, kefir, drinkable yogurt, buttermilk, soy milk, or plain hard cheese.  Avoid: Milk, chocolate milk, beverages made with milk, such as milkshakes. Soups  Recommended: Bouillon, broth, or soups made from allowed foods. Any strained soup.  Avoid: Soups made from vegetables that are not allowed, cream or milk-based soups. Desserts and Sweets  Recommended: Sugar-free gelatin, sugar-free frozen ice pops made without sugar alcohol.  Avoid: Plain cakes and cookies, pie made with fruit, pudding, custard, cream pie. Gelatin, fruit, ice, sherbet, frozen ice pops. Ice cream, ice milk without nuts. Plain hard candy, honey, jelly, molasses, syrup, sugar, chocolate syrup, gumdrops, marshmallows. Fats and Oils  Recommended: Limit fats to less than 8 tsp per day.  Avoid: Seeds, nuts, olives, avocados. Margarine, butter, cream, mayonnaise, salad oils, plain salad dressings. Plain gravy, crisp bacon without rind. Beverages  Recommended: Water, decaffeinated teas, oral rehydration solutions, sugar-free beverages not sweetened with sugar alcohols.  Avoid: Fruit juices, caffeinated beverages (coffee, tea, soda), alcohol, sports drinks, or lemon-lime soda. Condiments  Recommended: Ketchup, mustard, horseradish, vinegar, cocoa powder. Spices in moderation: allspice, basil, bay leaves, celery powder or leaves, cinnamon, cumin powder, curry powder, ginger, mace, marjoram, onion or garlic powder, oregano, paprika, parsley flakes, ground pepper, rosemary, sage, savory, tarragon, thyme, turmeric.  Avoid: Coconut, honey. Document Released: 10/07/2003 Document Revised: 04/10/2012 Document Reviewed: 12/01/2011 [facility] Patient Information 2014 [place].  If vomiting  persists for another day or 2 you have need to followup to have labs rechecked. Return for any newer worse symptoms. Your blood sugar improved here significantly came down to 221. Take Zofran as needed for nausea and vomiting. Special diet for the diarrhea detailed above. Followup with your doctors later this week if not completely improved. Return here for any newer worse symptoms. Definitely need to get seen at the vomiting persist for one to 2 days.

## 2013-12-19 ENCOUNTER — Other Ambulatory Visit: Payer: Self-pay

## 2013-12-19 DIAGNOSIS — R0609 Other forms of dyspnea: Secondary | ICD-10-CM

## 2013-12-19 DIAGNOSIS — I2510 Atherosclerotic heart disease of native coronary artery without angina pectoris: Secondary | ICD-10-CM

## 2013-12-19 MED ORDER — ISOSORBIDE MONONITRATE ER 30 MG PO TB24: 30.0000 mg | ORAL_TABLET | Freq: Every day | ORAL | Status: DC

## 2013-12-19 MED ORDER — VALSARTAN 80 MG PO TABS: 80.0000 mg | ORAL_TABLET | Freq: Every day | ORAL | Status: DC

## 2013-12-19 MED ORDER — HYDROCHLOROTHIAZIDE 25 MG PO TABS: 25.0000 mg | ORAL_TABLET | Freq: Every day | ORAL | Status: DC

## 2013-12-19 MED ORDER — CLOPIDOGREL BISULFATE 75 MG PO TABS: 75.0000 mg | ORAL_TABLET | Freq: Every day | ORAL | Status: DC

## 2013-12-19 MED ORDER — ATORVASTATIN CALCIUM 40 MG PO TABS: 40.0000 mg | ORAL_TABLET | Freq: Every day | ORAL | Status: DC

## 2013-12-19 MED ORDER — METOPROLOL TARTRATE 25 MG PO TABS: 25.0000 mg | ORAL_TABLET | Freq: Two times a day (BID) | ORAL | Status: DC

## 2013-12-25 ENCOUNTER — Other Ambulatory Visit: Payer: Self-pay

## 2013-12-25 DIAGNOSIS — R0609 Other forms of dyspnea: Secondary | ICD-10-CM

## 2013-12-25 DIAGNOSIS — I2510 Atherosclerotic heart disease of native coronary artery without angina pectoris: Secondary | ICD-10-CM

## 2013-12-25 MED ORDER — METOPROLOL TARTRATE 25 MG PO TABS: 25.0000 mg | ORAL_TABLET | Freq: Two times a day (BID) | ORAL | Status: DC

## 2013-12-25 MED ORDER — ATORVASTATIN CALCIUM 40 MG PO TABS: 40.0000 mg | ORAL_TABLET | Freq: Every day | ORAL | Status: DC

## 2013-12-25 MED ORDER — CLOPIDOGREL BISULFATE 75 MG PO TABS: 75.0000 mg | ORAL_TABLET | Freq: Every day | ORAL | Status: DC

## 2014-03-23 ENCOUNTER — Ambulatory Visit (INDEPENDENT_AMBULATORY_CARE_PROVIDER_SITE_OTHER): Payer: Medicare Other | Admitting: Cardiovascular Disease

## 2014-03-23 ENCOUNTER — Encounter: Payer: Self-pay | Admitting: Cardiovascular Disease

## 2014-03-23 ENCOUNTER — Other Ambulatory Visit: Payer: Medicare Other

## 2014-03-23 VITALS — BP 110/80 | HR 64 | Ht 66.0 in | Wt 180.8 lb

## 2014-03-23 DIAGNOSIS — I2510 Atherosclerotic heart disease of native coronary artery without angina pectoris: Secondary | ICD-10-CM

## 2014-03-23 DIAGNOSIS — E785 Hyperlipidemia, unspecified: Secondary | ICD-10-CM

## 2014-03-23 MED ORDER — EZETIMIBE 10 MG PO TABS: 10.0000 mg | ORAL_TABLET | Freq: Every day | ORAL | Status: DC

## 2014-03-23 NOTE — Assessment & Plan Note (Signed)
He's doing well from a cardiac standpoint. Not having episodes of chest pain. He is not tolerating the atorvastatin at this point due to muscle aches. Will try him on Zetia   Check lipids, liver, BMP in 3 months.  Will see him in 6 months.

## 2014-03-23 NOTE — Progress Notes (Signed)
Benjamin Harrison Date of Birth  [birth year] [facility] [address]    [address]   [place], [place]  [address]    [place], [place]  [address] [phone]  Fax  [phone]  Fax [phone]   Problem list:  1. Coronary artery disease: Status post CABG in2001. He status post stenting of his mid right coronary artery in 10/17/2010 2. Hypertension 3. Hypothyroidism 4. Hyperlipidemia 5. Diabetes mellitus  History of Present Illness:  Benjamin Harrison is a 79 year old gentleman with a history of coronary artery disease. He status post PTCA and stenting of his right coronary. He had repeat stenting of his right coronary artery as recently as March, 2012.  He had another heart attack and had severe stenosis of his distal right coronary artery stent. Dr. Martinique opened up the stent in March, 2013.  Saphenous vein graft to right coronary artery is occluded.  Since that time he's had persistent headaches and generalized weakness.   He's continued to have some episodes of chest pain.  He's also had some episodes of lightheadedness.  He's feeling a bit better since I saw him several months ago. He still is not able to exercise with about 15 or 20 minutes. Is clear that he still eats salty foods. He enjoys peanut butter on Ritz crackers almost every night. He also eats occasional country ham and gravy biscuits.  October 07, 2012  He is doing well.  He had a cath in November, 2013.  He has been on Imdur and is feeling better.    He occasionally has a headache.    Nov. 24, 2014:    Benjamin Harrison is having more dyspnea with exertion recently - walking outside, taking a shower.  He avoids salt.  He has not had much if any chest pain.    Feb. 24, 2015:  Benjamin Harrison is doing ok.  He was having some dyspnea but this has resolved.    March 23, 2014:  He is doing ok.  He stopped taking his atorvastatin due to leg aches. His aches and pains are  better since in the atorvastatin.  He has lots of problems with arthritis and has taken steroids.  Staying active except as limited by his joints   Current Outpatient Prescriptions on File Prior to Visit  Medication Sig Dispense Refill  . aspirin 81 MG tablet Take 1 tablet (81 mg total) by mouth daily.      Marland Kitchen atorvastatin (LIPITOR) 40 MG tablet Take 1 tablet (40 mg total) by mouth daily.  90 tablet  1  . clopidogrel (PLAVIX) 75 MG tablet Take 1 tablet (75 mg total) by mouth daily.  90 tablet  1  . Esomeprazole Magnesium (NEXIUM PO) Take 22.3 mg by mouth daily.      Marland Kitchen glimepiride (AMARYL) 4 MG tablet Take 4 mg by mouth daily with breakfast.      . hydrochlorothiazide (HYDRODIURIL) 25 MG tablet Take 1 tablet (25 mg total) by mouth daily.  90 tablet  3  . isosorbide mononitrate (IMDUR) 30 MG 24 hr tablet Take 1 tablet (30 mg total) by mouth daily.  90 tablet  3  . levothyroxine (SYNTHROID, LEVOTHROID) 100 MCG tablet Take 100 mcg by mouth daily.      . metoprolol tartrate (LOPRESSOR) 25 MG tablet Take 1 tablet (25 mg total) by mouth 2 (two) times daily.  180 tablet  1  .  nitroGLYCERIN (NITROSTAT) 0.4 MG SL tablet Place 1 tablet (0.4 mg total) under the tongue every 5 (five) minutes as needed. For chest pain.      . valsartan (DIOVAN) 80 MG tablet Take 1 tablet (80 mg total) by mouth daily.  90 tablet  3   No current facility-administered medications on file prior to visit.    Allergies  Allergen Reactions  . Codeine     Makes pt Sick    Past Medical History  Diagnosis Date  . Coronary artery disease     a. CABG 2001 with early failure of VG-RCA. b. Inf MI after Lexiscan 01/28/2009 s/p RCA stent. c. Stent to dRCA 02/24/09. d. DES to [place]  2012. e. NSTEMI s/p angioplasty for ISR of RCA 09/2011. f. Severe distal diffuse dz likely cause of angina by cath 05/2012.  Marland Kitchen Hypertension   . Hypothyroidism   . Hyperlipidemia   . Personal history of tobacco use     REMOTE IN THE PAST  . Diabetes  mellitus   . Dyslipidemia   . Osteoarthritis     End-stage osteoarthritis, right knee, medial compartment    Past Surgical History  Procedure Laterality Date  . Coronary artery bypass graft    . Cardiac catheterization    . Knee arthroscopy  1999 and 2004  . Hernia repair  1985    History  Smoking status  . Former Smoker  . Quit date: 08/01/1975  Smokeless tobacco  . Not on file    History  Alcohol Use No    Family History  Problem Relation Age of Onset  . Hypertension Mother   . Cancer Sister     Throat Cancer  . Heart disease Brother   . Pneumonia Brother   . Heart disease Brother   . Cancer Brother     Prostate Cancer    Reviw of Systems:  Reviewed in the HPI.  All other systems are negative.  Physical Exam: BP 110/80  Pulse 64  Ht 5\' 6"  (1.676 m)  Wt 180 lb 12.8 oz (82.01 kg)  BMI 29.20 kg/m2 The patient is alert and oriented x 3.  The mood and affect are normal.   Skin: warm and dry.  Color is normal.    HEENT:   Normocephalic/atraumatic. His mucous membranes are moist. His neck is supple. Is no JVD. His carotids are normal.  Lungs: Lungs are clear. His back is nontender.   Heart: Regular rate S1-S2. He has a soft sysotlic murmur   Abdomen: Has good bowel sounds. He is mildly obese. There is no hepatosplenomegaly.  Extremities:  No clubbing cyanosis or edema  Neuro:  Neuro exam is nonfocal. His gait is normal.    ECG  Assessment / Plan:

## 2014-03-23 NOTE — Patient Instructions (Signed)
Your physician has recommended you make the following change in your medication:  STOP Atorvastatin START Zetia 10 mg once daily   Your physician recommends that you return for lab work in: 3 months on Tuesday Dec. 1 at [place] will need to FAST for this appointment - nothing to eat or drink after midnight the night before except water.  Your physician wants you to follow-up in: 6 months with Dr. Acie Fredrickson.  You will receive a reminder letter in the mail two months in advance. If you don't receive a letter, please call our office to schedule the follow-up appointment.

## 2014-03-23 NOTE — Assessment & Plan Note (Signed)
BP is well controlled 

## 2014-05-13 ENCOUNTER — Other Ambulatory Visit: Payer: Self-pay | Admitting: *Deleted

## 2014-05-13 MED ORDER — CLOPIDOGREL BISULFATE 75 MG PO TABS: 75.0000 mg | ORAL_TABLET | Freq: Every day | ORAL | Status: DC

## 2014-05-13 MED ORDER — METOPROLOL TARTRATE 25 MG PO TABS: 25.0000 mg | ORAL_TABLET | Freq: Two times a day (BID) | ORAL | Status: DC

## 2014-05-31 ENCOUNTER — Other Ambulatory Visit: Payer: Self-pay

## 2014-05-31 MED ORDER — EZETIMIBE 10 MG PO TABS: 10.0000 mg | ORAL_TABLET | Freq: Every day | ORAL | Status: DC

## 2014-06-30 ENCOUNTER — Other Ambulatory Visit (INDEPENDENT_AMBULATORY_CARE_PROVIDER_SITE_OTHER): Payer: Medicare Other | Admitting: *Deleted

## 2014-06-30 DIAGNOSIS — E785 Hyperlipidemia, unspecified: Secondary | ICD-10-CM

## 2014-06-30 DIAGNOSIS — I2510 Atherosclerotic heart disease of native coronary artery without angina pectoris: Secondary | ICD-10-CM

## 2014-06-30 LAB — HEPATIC FUNCTION PANEL
ALBUMIN: 3.6 g/dL (ref 3.5–5.2)
ALK PHOS: 67 U/L (ref 39–117)
ALT: 35 U/L (ref 0–53)
AST: 26 U/L (ref 0–37)
BILIRUBIN DIRECT: 0 mg/dL (ref 0.0–0.3)
Total Bilirubin: 0.8 mg/dL (ref 0.2–1.2)
Total Protein: 6.5 g/dL (ref 6.0–8.3)

## 2014-06-30 LAB — BASIC METABOLIC PANEL
BUN: 20 mg/dL (ref 6–23)
CALCIUM: 9.1 mg/dL (ref 8.4–10.5)
CO2: 26 meq/L (ref 19–32)
CREATININE: 1.2 mg/dL (ref 0.4–1.5)
Chloride: 106 mEq/L (ref 96–112)
GFR: 60 mL/min — ABNORMAL LOW (ref >60.00)
Glucose, Bld: 127 mg/dL — ABNORMAL HIGH (ref 70–99)
Potassium: 4.3 mEq/L (ref 3.5–5.1)
Sodium: 141 mEq/L (ref 135–145)

## 2014-06-30 LAB — LIPID PANEL
Cholesterol: 146 mg/dL (ref 0–200)
HDL: 27.8 mg/dL — AB (ref >39.00)
LDL Cholesterol: 86 mg/dL (ref 0–99)
NonHDL: 118.2
TRIGLYCERIDES: 160 mg/dL — AB (ref 0.0–149.0)
Total CHOL/HDL Ratio: 5
VLDL: 32 mg/dL (ref 0.0–40.0)

## 2014-07-09 ENCOUNTER — Encounter (HOSPITAL_COMMUNITY): Payer: Self-pay | Admitting: Cardiology

## 2014-09-10 ENCOUNTER — Other Ambulatory Visit: Payer: Self-pay

## 2014-09-10 MED ORDER — VALSARTAN 80 MG PO TABS: 80.0000 mg | ORAL_TABLET | Freq: Every day | ORAL | Status: DC

## 2014-09-11 ENCOUNTER — Other Ambulatory Visit: Payer: Self-pay | Admitting: *Deleted

## 2014-09-11 MED ORDER — CLOPIDOGREL BISULFATE 75 MG PO TABS: 75.0000 mg | ORAL_TABLET | Freq: Every day | ORAL | Status: DC

## 2014-09-21 ENCOUNTER — Other Ambulatory Visit: Payer: Self-pay | Admitting: *Deleted

## 2014-09-21 ENCOUNTER — Encounter: Payer: Self-pay | Admitting: Cardiovascular Disease

## 2014-09-21 ENCOUNTER — Ambulatory Visit (INDEPENDENT_AMBULATORY_CARE_PROVIDER_SITE_OTHER): Payer: Medicare Other | Admitting: Cardiovascular Disease

## 2014-09-21 VITALS — BP 120/82 | HR 66 | Ht 66.0 in | Wt 176.8 lb

## 2014-09-21 DIAGNOSIS — I2510 Atherosclerotic heart disease of native coronary artery without angina pectoris: Secondary | ICD-10-CM

## 2014-09-21 DIAGNOSIS — I10 Essential (primary) hypertension: Secondary | ICD-10-CM

## 2014-09-21 DIAGNOSIS — R0789 Other chest pain: Secondary | ICD-10-CM

## 2014-09-21 MED ORDER — ISOSORBIDE MONONITRATE ER 30 MG PO TB24: 30.0000 mg | ORAL_TABLET | Freq: Every day | ORAL | Status: DC

## 2014-09-21 MED ORDER — NITROGLYCERIN 0.4 MG SL SUBL: 0.4000 mg | SUBLINGUAL_TABLET | SUBLINGUAL | Status: DC | PRN

## 2014-09-21 MED ORDER — METOPROLOL TARTRATE 25 MG PO TABS: 25.0000 mg | ORAL_TABLET | Freq: Two times a day (BID) | ORAL | Status: DC

## 2014-09-21 MED ORDER — VALSARTAN 80 MG PO TABS: 80.0000 mg | ORAL_TABLET | Freq: Every day | ORAL | Status: DC

## 2014-09-21 MED ORDER — ATORVASTATIN CALCIUM 40 MG PO TABS: 40.0000 mg | ORAL_TABLET | Freq: Every day | ORAL | Status: DC

## 2014-09-21 NOTE — Progress Notes (Signed)
Cardiology Office Note   Date:  09/21/2014   ID:  Benjamin Harrison, DOB Feb 05, 1935, MRN [id number]  PCP:  Leonard Downing, MD  Cardiologist:   Thayer Headings, MD   Chief Complaint  Patient presents with  . Follow-up    CAD   1. Coronary artery disease: Status post CABG in2001. He status post stenting of his mid right coronary artery in 10/17/2010 2. Hypertension 3. Hypothyroidism 4. Hyperlipidemia 5. Diabetes mellitus  History of Present Illness:  Benjamin Harrison is a 80 year old gentleman with a history of coronary artery disease. He status post PTCA and stenting of his right coronary. He had repeat stenting of his right coronary artery as recently as March, 2012. He had another heart attack and had severe stenosis of his distal right coronary artery stent. Dr. Martinique opened up the stent in March, 2013. Saphenous vein graft to right coronary artery is occluded.  Since that time he's had persistent headaches and generalized weakness. He's continued to have some episodes of chest pain. He's also had some episodes of lightheadedness.  He's feeling a bit better since I saw him several months ago. He still is not able to exercise with about 15 or 20 minutes. Is clear that he still eats salty foods. He enjoys peanut butter on Ritz crackers almost every night. He also eats occasional country ham and gravy biscuits.  October 07, 2012  He is doing well. He had a cath in November, 2013. He has been on Imdur and is feeling better. He occasionally has a headache.   Nov. 24, 2014:  [place] is having more dyspnea with exertion recently - walking outside, taking a shower.  He avoids salt.  He has not had much if any chest pain.   Feb. 24, 2015: [name] is doing ok. He was having some dyspnea but this has resolved.   March 23, 2014: He is doing ok. He stopped taking his atorvastatin due to leg aches. His aches and pains are better since in the atorvastatin. He has  lots of problems with arthritis and has taken steroids. Staying active except as limited by his joints    Feb. 22, 2016: Benjamin Harrison is a 80 y.o. male who presents for follow up of his CAD. He has been having some chest tightness and pain.  Occurred at night.  Was very weak for 3 days. Was just getting ready for bed.   Radiated out to his shoulder.  Occasionally has exertional CP / last for several minutes.  No palpitations,  No dizziness  ,  Does have some orthostatic hypotension.    Past Medical History  Diagnosis Date  . Coronary artery disease     a. CABG 2001 with early failure of VG-RCA. b. Inf MI after Lexiscan 01/28/2009 s/p RCA stent. c. Stent to dRCA 02/24/09. d. DES to [place]  2012. e. NSTEMI s/p angioplasty for ISR of RCA 09/2011. f. Severe distal diffuse dz likely cause of angina by cath 05/2012.  Marland Kitchen Hypertension   . Hypothyroidism   . Hyperlipidemia   . Personal history of tobacco use     REMOTE IN THE PAST  . Diabetes mellitus   . Dyslipidemia   . Osteoarthritis     End-stage osteoarthritis, right knee, medial compartment    Past Surgical History  Procedure Laterality Date  . Coronary artery bypass graft    . Cardiac catheterization    . Knee arthroscopy  1999 and 2004  . Hernia repair  1985  . Left heart catheterization with coronary angiogram N/A 10/09/2011    Procedure: LEFT HEART CATHETERIZATION WITH CORONARY ANGIOGRAM;  Surgeon: Peter M Martinique, MD;  Location: [facility] CATH LAB;  Service: Cardiovascular;  Laterality: N/A;  . Left heart catheterization with coronary/graft angiogram N/A 06/06/2012    Procedure: LEFT HEART CATHETERIZATION WITH Beatrix Fetters;  Surgeon: Peter M Martinique, MD;  Location: [facility] CATH LAB;  Service: Cardiovascular;  Laterality: N/A;     Current Outpatient Prescriptions  Medication Sig Dispense Refill  . aspirin 81 MG tablet Take 1 tablet (81 mg total) by mouth daily.    . clopidogrel (PLAVIX) 75 MG tablet Take 1 tablet (75 mg  total) by mouth daily. 90 tablet 3  . Esomeprazole Magnesium (NEXIUM PO) Take 22.3 mg by mouth daily.    Marland Kitchen glimepiride (AMARYL) 4 MG tablet Take 4 mg by mouth daily with breakfast.    . hydrochlorothiazide (HYDRODIURIL) 25 MG tablet Take 1 tablet (25 mg total) by mouth daily. 90 tablet 3  . isosorbide mononitrate (IMDUR) 30 MG 24 hr tablet Take 1 tablet (30 mg total) by mouth daily. 90 tablet 3  . levothyroxine (SYNTHROID, LEVOTHROID) 100 MCG tablet Take 100 mcg by mouth daily.    . metFORMIN (GLUCOPHAGE) 1000 MG tablet Take by mouth daily with breakfast.     . metoprolol tartrate (LOPRESSOR) 25 MG tablet Take 1 tablet (25 mg total) by mouth 2 (two) times daily. 180 tablet 3  . nitroGLYCERIN (NITROSTAT) 0.4 MG SL tablet Place 1 tablet (0.4 mg total) under the tongue every 5 (five) minutes as needed. For chest pain.    . valsartan (DIOVAN) 80 MG tablet Take 1 tablet (80 mg total) by mouth daily. 90 tablet 4  . ezetimibe (ZETIA) 10 MG tablet Take 1 tablet (10 mg total) by mouth daily. (Patient not taking: Reported on 09/21/2014) 90 tablet 3   No current facility-administered medications for this visit.    Allergies:   Codeine    Social History:  The patient  reports that he quit smoking about 39 years ago. He does not have any smokeless tobacco history on file. He reports that he does not drink alcohol or use illicit drugs.   Family History:  The patient's family history includes Cancer in his brother and sister; Heart disease in his brother and brother; Hypertension in his mother; Pneumonia in his brother.    ROS:  Please see the history of present illness.    Review of Systems: Constitutional:  denies fever, chills, diaphoresis, appetite change and fatigue.  HEENT: denies photophobia, eye pain, redness, hearing loss, ear pain, congestion, sore throat, rhinorrhea, sneezing, neck pain, neck stiffness and tinnitus.  Respiratory: denies SOB, DOE, cough, chest tightness, and wheezing.    Cardiovascular: admits to chest pain,  , exertional CP   Gastrointestinal: denies nausea, vomiting, abdominal pain, diarrhea, constipation, blood in stool.  Genitourinary: denies dysuria, urgency, frequency, hematuria, flank pain and difficulty urinating.  Musculoskeletal: denies  myalgias, back pain, joint swelling, arthralgias and gait problem.   Skin: denies pallor, rash and wound.  Neurological: denies dizziness, seizures, syncope, weakness, light-headedness, numbness and headaches.   Hematological: denies adenopathy, easy bruising, personal or family bleeding history.  Psychiatric/ Behavioral: denies suicidal ideation, mood changes, confusion, nervousness, sleep disturbance and agitation.       All other systems are reviewed and negative.    PHYSICAL EXAM: VS:  BP 120/82 mmHg  Pulse 66  Ht 5\' 6"  (1.676 m)  Wt 176  lb 12.8 oz (80.196 kg)  BMI 28.55 kg/m2 , BMI Body mass index is 28.55 kg/(m^2). GEN: Well nourished, well developed, in no acute distress HEENT: normal Neck: no JVD, carotid bruits, or masses Cardiac: RRR; no murmurs, rubs, or gallops,no edema  Respiratory:  clear to auscultation bilaterally, normal work of breathing GI: soft, nontender, nondistended, + BS MS: no deformity or atrophy Skin: warm and dry, no rash Neuro:  Strength and sensation are intact Psych: normal   EKG:  EKG is ordered today. The ekg ordered today demonstrates :  Normal sinus rhythm at 66. He has an old anterior wall myocardial infarction.   Recent Labs: 11/03/2013: Hemoglobin 15.6; Platelets 186; Pro B Natriuretic peptide (BNP) 175.4 06/30/2014: ALT 35; BUN 20; Creatinine 1.2; Potassium 4.3; Sodium 141    Lipid Panel    Component Value Date/Time   CHOL 146 06/30/2014 0903   TRIG 160.0* 06/30/2014 0903   HDL 27.80* 06/30/2014 0903   CHOLHDL 5 06/30/2014 0903   VLDL 32.0 06/30/2014 0903   LDLCALC 86 06/30/2014 0903      Wt Readings from Last 3 Encounters:  09/21/14 176 lb 12.8 oz  (80.196 kg)  03/23/14 180 lb 12.8 oz (82.01 kg)  11/03/13 186 lb (84.369 kg)      Other studies Reviewed: Additional studies/ records that were reviewed today include: . Review of the above records demonstrates:    ASSESSMENT AND PLAN:  1. Coronary artery disease: Status post CABG in2001. He status post stenting of his mid right coronary artery in 10/17/2010.  He he's having some symptoms that are concerning for unstable angina. We'll schedule him for a [employer] study. We will also schedule him for an echocardiogram. We'll refill his nitroglycerin 2. Hypertension 3. Hypothyroidism 4. Hyperlipidemia 5. Diabetes mellitus   Current medicines are reviewed at length with the patient today.  The patient does not have concerns regarding medicines.  The following changes have been made:  See above.    Disposition:   FU with me in 3 months    Signed, Jady Braggs, [name], MD  09/21/2014 10:01 AM    [facility] [place], [place], [place]  [address] Phone: [phone]; Fax: [phone]

## 2014-09-21 NOTE — Patient Instructions (Addendum)
Your physician has recommended you make the following change in your medication:  STOP Zetia START Atorvastatin 40 mg once daily in the evening  Your physician has requested that you have an echocardiogram. Echocardiography is a painless test that uses sound waves to create images of your heart. It provides your doctor with information about the size and shape of your heart and how well your heart's chambers and valves are working. This procedure takes approximately one hour. There are no restrictions for this procedure.  Your physician has requested that you have a lexiscan myoview. For further information please visit [email or web address]. Please follow instruction sheet, as given.  Your physician recommends that you schedule a follow-up appointment in: 3 months with  Dr. Acie Fredrickson.  Your physician recommends that you return for lab work in: 3 months on the day of or a few days before your office visit with Dr. Acie Fredrickson.  You will need to FAST for this appointment - nothing to eat or drink after midnight the night before except water.

## 2014-09-30 ENCOUNTER — Ambulatory Visit (HOSPITAL_COMMUNITY): Payer: Medicare Other | Attending: Cardiovascular Disease | Admitting: Radiology

## 2014-09-30 ENCOUNTER — Ambulatory Visit (HOSPITAL_BASED_OUTPATIENT_CLINIC_OR_DEPARTMENT_OTHER): Payer: Medicare Other | Admitting: Cardiology

## 2014-09-30 DIAGNOSIS — E119 Type 2 diabetes mellitus without complications: Secondary | ICD-10-CM | POA: Diagnosis not present

## 2014-09-30 DIAGNOSIS — R079 Chest pain, unspecified: Secondary | ICD-10-CM | POA: Insufficient documentation

## 2014-09-30 DIAGNOSIS — I10 Essential (primary) hypertension: Secondary | ICD-10-CM | POA: Diagnosis not present

## 2014-09-30 DIAGNOSIS — I2510 Atherosclerotic heart disease of native coronary artery without angina pectoris: Secondary | ICD-10-CM

## 2014-09-30 DIAGNOSIS — R0789 Other chest pain: Secondary | ICD-10-CM

## 2014-09-30 DIAGNOSIS — R42 Dizziness and giddiness: Secondary | ICD-10-CM | POA: Diagnosis not present

## 2014-09-30 MED ORDER — TECHNETIUM TC 99M SESTAMIBI GENERIC - CARDIOLITE
33.0000 | Freq: Once | INTRAVENOUS | Status: AC | PRN
  Administered 2014-09-30: 33 via INTRAVENOUS

## 2014-09-30 MED ORDER — TECHNETIUM TC 99M SESTAMIBI GENERIC - CARDIOLITE
11.0000 | Freq: Once | INTRAVENOUS | Status: AC | PRN
  Administered 2014-09-30: 11 via INTRAVENOUS

## 2014-09-30 MED ORDER — REGADENOSON 0.4 MG/5ML IV SOLN
0.4000 mg | Freq: Once | INTRAVENOUS | Status: AC
  Administered 2014-09-30: 0.4 mg via INTRAVENOUS

## 2014-09-30 NOTE — Progress Notes (Signed)
Echo performed. 

## 2014-09-30 NOTE — Progress Notes (Signed)
[name] [address] [place], [place] [address] [phone]    Cardiology Nuclear Med Study  Benjamin Harrison is a 80 y.o. male     MRN : [id number]     DOB: [birth year]  Procedure Date: 09/30/2014  Nuclear Med Background Indication for Stress Test:  Evaluation for Ischemia History:  CAD, MPI: 2014 EF: 58% NL, Cardiac Risk Factors: Hypertension and NIDDM  Symptoms:  Chest Pain and Light-Headedness   Nuclear Pre-Procedure Caffeine/Decaff Intake:  None NPO After: 8:00pm   Lungs:  clear O2 Sat: 95% on room air. IV 0.9% NS with Angio Cath:  22g  IV Site: R Hand  IV Started by:  Crissie Figures, RN  Chest Size (in):  48 Cup Size: n/a  Height: 5\' 6"  (1.676 m)  Weight:  178 lb (80.74 kg)  BMI:  Body mass index is 28.74 kg/(m^2). Tech Comments:  N/A    Nuclear Med Study 1 or 2 day study: 1 day  Stress Test Type:  Lexiscan  Reading MD: N/A  Order Authorizing Provider:  Mertie Moores, MD  Resting Radionuclide: Technetium 40m Sestamibi  Resting Radionuclide Dose: 11.0 mCi   Stress Radionuclide:  Technetium 33m Sestamibi  Stress Radionuclide Dose: 33.0 mCi           Stress Protocol Rest HR: 62 Stress HR: 86  Rest BP: 148/88 Stress BP: 155/98  Exercise Time (min): n/a METS: n/a   Predicted Max HR: 143 bpm % Max HR: 60.14 bpm Rate Pressure Product: 13330   Dose of Adenosine (mg):  n/a Dose of Lexiscan: 0.4 mg  Dose of Atropine (mg): n/a Dose of Dobutamine: n/a mcg/kg/min (at max HR)  Stress Test Technologist: Perrin Maltese, EMT-P  Nuclear Technologist:  Earl Many, CNMT     Rest Procedure:  Myocardial perfusion imaging was performed at rest 45 minutes following the intravenous administration of Technetium 25m Sestamibi. Rest ECG: Normal sinus rhythm. Normal EKG.  Stress Procedure:  The patient received IV Lexiscan 0.4 mg over 15-seconds.  Technetium 69m Sestamibi injected at 30-seconds. This patient had sob and chest tightness with the  Lexiscan injection. Quantitative spect images were obtained after a 45 minute delay. Stress ECG:   QPS Raw Data Images:  There is no significant motion. Stress Images:  Small area of moderate decreased uptake affecting the apical lateral segment and the apical cap. Rest Images:  Rest images are the same as stress images. Subtraction (SDS):  There is no ischemia. Transient Ischemic Dilatation (Normal <1.22):  0.99 Lung/Heart Ratio (Normal <0.45):  0.33  Quantitative Gated Spect Images QGS EDV:  78 ml QGS ESV:  32 ml  Impression Exercise Capacity:  Lexiscan without exercise BP Response:  Normal blood pressure response Clinical Symptoms:  Shortness of breath and chest tightness ECG Impression:  No significant EKG change Comparison with Prior Nuclear Study:  The study is compared with the report and the hard copy images of the study from December, 2014.  Overall Impression:  There is a small area of moderate scar affecting the apical cap and the apical lateral segment. There is no ischemia. This is a low risk scan. The description of the study from December, 2014 is slightly different from this study. However, after careful review, I am not convinced that there has been any change. There is question of an area of insignificant scar near the apex. There is no ischemia.  LV Ejection Fraction: 58%.  LV Wall Motion:  Normal Wall Motion  Daryel November, MD

## 2014-10-01 ENCOUNTER — Telehealth: Payer: Self-pay | Admitting: Cardiovascular Disease

## 2014-10-01 NOTE — Telephone Encounter (Signed)
Results of echocardiogram and lexiscan myoview reviewed with patient who verbalized understanding

## 2014-10-01 NOTE — Telephone Encounter (Signed)
New problem   Pt returning your call please call pt on house phone.

## 2014-10-19 ENCOUNTER — Telehealth: Payer: Self-pay | Admitting: Cardiovascular Disease

## 2014-10-19 NOTE — Telephone Encounter (Signed)
New Msg        Pt states he is returning call from Friday.    Please return pt call.

## 2014-10-19 NOTE — Telephone Encounter (Signed)
Spoke with patient and advised him that I am not aware of a call from our office to him.  Patient states he is also not aware of any outstanding results or any reason that we would need to call him. I apologized for the confusion and patient verbalized understanding

## 2014-12-04 ENCOUNTER — Other Ambulatory Visit (INDEPENDENT_AMBULATORY_CARE_PROVIDER_SITE_OTHER): Payer: Medicare Other | Admitting: *Deleted

## 2014-12-04 ENCOUNTER — Encounter: Payer: Self-pay | Admitting: Cardiovascular Disease

## 2014-12-04 ENCOUNTER — Ambulatory Visit (INDEPENDENT_AMBULATORY_CARE_PROVIDER_SITE_OTHER): Payer: Medicare Other | Admitting: Cardiovascular Disease

## 2014-12-04 VITALS — BP 144/84 | HR 63 | Ht 66.0 in | Wt 177.0 lb

## 2014-12-04 DIAGNOSIS — E785 Hyperlipidemia, unspecified: Secondary | ICD-10-CM

## 2014-12-04 DIAGNOSIS — I10 Essential (primary) hypertension: Secondary | ICD-10-CM | POA: Diagnosis not present

## 2014-12-04 DIAGNOSIS — I2510 Atherosclerotic heart disease of native coronary artery without angina pectoris: Secondary | ICD-10-CM

## 2014-12-04 LAB — LIPID PANEL
Cholesterol: 119 mg/dL (ref 0–200)
HDL: 30.5 mg/dL — ABNORMAL LOW (ref >39.00)
LDL Cholesterol: 69 mg/dL (ref 0–99)
NonHDL: 88.5
Total CHOL/HDL Ratio: 4
Triglycerides: 96 mg/dL (ref 0.0–149.0)
VLDL: 19.2 mg/dL (ref 0.0–40.0)

## 2014-12-04 LAB — HEPATIC FUNCTION PANEL
ALT: 30 U/L (ref 0–53)
AST: 22 U/L (ref 0–37)
Albumin: 3.7 g/dL (ref 3.5–5.2)
Alkaline Phosphatase: 73 U/L (ref 39–117)
BILIRUBIN DIRECT: 0.2 mg/dL (ref 0.0–0.3)
Total Bilirubin: 1 mg/dL (ref 0.2–1.2)
Total Protein: 7 g/dL (ref 6.0–8.3)

## 2014-12-04 LAB — BASIC METABOLIC PANEL
BUN: 14 mg/dL (ref 6–23)
CALCIUM: 9.3 mg/dL (ref 8.4–10.5)
CO2: 32 meq/L (ref 19–32)
CREATININE: 1.19 mg/dL (ref 0.40–1.50)
Chloride: 102 mEq/L (ref 96–112)
GFR: 62.84 mL/min (ref >60.00)
Glucose, Bld: 151 mg/dL — ABNORMAL HIGH (ref 70–99)
Potassium: 3.8 mEq/L (ref 3.5–5.1)
Sodium: 139 mEq/L (ref 135–145)

## 2014-12-04 MED ORDER — ISOSORBIDE MONONITRATE ER 60 MG PO TB24: 60.0000 mg | ORAL_TABLET | Freq: Every day | ORAL | Status: DC

## 2014-12-04 NOTE — Progress Notes (Signed)
Cardiology Office Note   Date:  12/04/2014   ID:  Benjamin Harrison, DOB 18-Apr-1935, MRN [id number]  PCP:  Leonard Downing, MD  Cardiologist:   Thayer Headings, MD   Chief Complaint  Patient presents with  . Coronary Artery Disease   1. Coronary artery disease: Status post CABG in2001. He status post stenting of his mid right coronary artery in 10/17/2010 2. Hypertension 3. Hypothyroidism 4. Hyperlipidemia 5. Diabetes mellitus  History of Present Illness:  Benjamin Harrison is a 80 year old gentleman with a history of coronary artery disease. He status post PTCA and stenting of his right coronary. He had repeat stenting of his right coronary artery as recently as March, 2012. He had another heart attack and had severe stenosis of his distal right coronary artery stent. Dr. Martinique opened up the stent in March, 2013. Saphenous vein graft to right coronary artery is occluded.  Since that time he's had persistent headaches and generalized weakness. He's continued to have some episodes of chest pain. He's also had some episodes of lightheadedness.  He's feeling a bit better since I saw him several months ago. He still is not able to exercise with about 15 or 20 minutes. Is clear that he still eats salty foods. He enjoys peanut butter on Ritz crackers almost every night. He also eats occasional country ham and gravy biscuits.  October 07, 2012  He is doing well. He had a cath in November, 2013. He has been on Imdur and is feeling better. He occasionally has a headache.   Nov. 24, 2014:  Benjamin Harrison is having more dyspnea with exertion recently - walking outside, taking a shower.  He avoids salt.  He has not had much if any chest pain.   Feb. 24, 2015: Benjamin Harrison is doing ok. He was having some dyspnea but this has resolved.   March 23, 2014: He is doing ok. He stopped taking his atorvastatin due to leg aches. His aches and pains are better since in the atorvastatin. He  has lots of problems with arthritis and has taken steroids. Staying active except as limited by his joints    Feb. 22, 2016: Benjamin Harrison is a 80 y.o. male who presents for follow up of his CAD. He has been having some chest tightness and pain.  Occurred at night.  Was very weak for 3 days. Was just getting ready for bed.   Radiated out to his shoulder.  Occasionally has exertional CP / last for several minutes.  No palpitations,  No dizziness  ,  Does have some orthostatic hypotension.   Dec 04, 2014: Benjamin Harrison was seen recently for some chest pain   Myoview shows: Overall Impression: There is a small area of moderate scar affecting the apical cap and the apical lateral segment. There is no ischemia. This is a low risk scan. The description of the study from December, 2014 is slightly different from this study. However, after careful review, I am not convinced that there has been any change. There is question of an area of insignificant scar near the apex. There is no ischemia.  LV Ejection Fraction: 58%. LV Wall Motion: Normal Wall Motion   Echo shows: Left ventricle: The cavity size was normal. There was mild focal basal hypertrophy of the septum. Systolic function was normal. The estimated ejection fraction was in the range of 55% to 60%. Probable mild hypokinesis of the basal-midinferior myocardium. Left ventricular diastolic function parameters were normal. - Aortic valve: There was trivial  regurgitation.      Past Medical History  Diagnosis Date  . Coronary artery disease     a. CABG 2001 with early failure of VG-RCA. b. Inf MI after Lexiscan 01/28/2009 s/p RCA stent. c. Stent to dRCA 02/24/09. d. DES to [place]  2012. e. NSTEMI s/p angioplasty for ISR of RCA 09/2011. f. Severe distal diffuse dz likely cause of angina by cath 05/2012.  Marland Kitchen Hypertension   . Hypothyroidism   . Hyperlipidemia   . Personal history of tobacco use     REMOTE IN THE PAST  .  Diabetes mellitus   . Dyslipidemia   . Osteoarthritis     End-stage osteoarthritis, right knee, medial compartment    Past Surgical History  Procedure Laterality Date  . Coronary artery bypass graft    . Cardiac catheterization    . Knee arthroscopy  1999 and 2004  . Hernia repair  1985  . Left heart catheterization with coronary angiogram N/A 10/09/2011    Procedure: LEFT HEART CATHETERIZATION WITH CORONARY ANGIOGRAM;  Surgeon: Peter M Martinique, MD;  Location: [facility] CATH LAB;  Service: Cardiovascular;  Laterality: N/A;  . Left heart catheterization with coronary/graft angiogram N/A 06/06/2012    Procedure: LEFT HEART CATHETERIZATION WITH Beatrix Fetters;  Surgeon: Peter M Martinique, MD;  Location: [facility] CATH LAB;  Service: Cardiovascular;  Laterality: N/A;     Current Outpatient Prescriptions  Medication Sig Dispense Refill  . aspirin 81 MG tablet Take 1 tablet (81 mg total) by mouth daily.    Marland Kitchen atorvastatin (LIPITOR) 40 MG tablet Take 1 tablet (40 mg total) by mouth daily. 90 tablet 3  . clopidogrel (PLAVIX) 75 MG tablet Take 1 tablet (75 mg total) by mouth daily. 90 tablet 3  . Esomeprazole Magnesium (NEXIUM PO) Take 22.3 mg by mouth daily.    Marland Kitchen glimepiride (AMARYL) 4 MG tablet Take 4 mg by mouth daily with breakfast.    . hydrochlorothiazide (HYDRODIURIL) 25 MG tablet Take 1 tablet (25 mg total) by mouth daily. 90 tablet 3  . isosorbide mononitrate (IMDUR) 30 MG 24 hr tablet Take 1 tablet (30 mg total) by mouth daily. 90 tablet 3  . levothyroxine (SYNTHROID, LEVOTHROID) 100 MCG tablet Take 100 mcg by mouth daily.    . metFORMIN (GLUCOPHAGE) 1000 MG tablet Take by mouth daily with breakfast.     . metoprolol tartrate (LOPRESSOR) 25 MG tablet Take 1 tablet (25 mg total) by mouth 2 (two) times daily. 180 tablet 3  . nitroGLYCERIN (NITROSTAT) 0.4 MG SL tablet Place 1 tablet (0.4 mg total) under the tongue every 5 (five) minutes as needed. For chest pain. 25 tablet 6  . valsartan (DIOVAN)  80 MG tablet Take 1 tablet (80 mg total) by mouth daily. 90 tablet 4   No current facility-administered medications for this visit.    Allergies:   Codeine    Social History:  The patient  reports that he quit smoking about 39 years ago. He does not have any smokeless tobacco history on file. He reports that he does not drink alcohol or use illicit drugs.   Family History:  The patient's family history includes Cancer in his brother and sister; Heart disease in his brother and brother; Hypertension in his mother; Pneumonia in his brother.    ROS:  Please see the history of present illness.    Review of Systems: Constitutional:  denies fever, chills, diaphoresis, appetite change and fatigue.  HEENT: denies photophobia, eye pain, redness, hearing  loss, ear pain, congestion, sore throat, rhinorrhea, sneezing, neck pain, neck stiffness and tinnitus.  Respiratory: denies SOB, DOE, cough, chest tightness, and wheezing.  Cardiovascular: admits to chest pain,  , exertional CP   Gastrointestinal: denies nausea, vomiting, abdominal pain, diarrhea, constipation, blood in stool.  Genitourinary: denies dysuria, urgency, frequency, hematuria, flank pain and difficulty urinating.  Musculoskeletal: denies  myalgias, back pain, joint swelling, arthralgias and gait problem.   Skin: denies pallor, rash and wound.  Neurological: denies dizziness, seizures, syncope, weakness, light-headedness, numbness and headaches.   Hematological: denies adenopathy, easy bruising, personal or family bleeding history.  Psychiatric/ Behavioral: denies suicidal ideation, mood changes, confusion, nervousness, sleep disturbance and agitation.       All other systems are reviewed and negative.    PHYSICAL EXAM: VS:  There were no vitals taken for this visit. , BMI There is no weight on file to calculate BMI. GEN: Well nourished, well developed, in no acute distress HEENT: normal Neck: no JVD, carotid bruits, or  masses Cardiac: RRR; no murmurs, rubs, or gallops,no edema  Respiratory:  clear to auscultation bilaterally, normal work of breathing GI: soft, nontender, nondistended, + BS MS: no deformity or atrophy Skin: warm and dry, no rash Neuro:  Strength and sensation are intact Psych: normal   EKG:  EKG is not ordered today.    Recent Labs: 06/30/2014: ALT 35; BUN 20; Creatinine 1.2; Potassium 4.3; Sodium 141    Lipid Panel    Component Value Date/Time   CHOL 146 06/30/2014 0903   TRIG 160.0* 06/30/2014 0903   HDL 27.80* 06/30/2014 0903   CHOLHDL 5 06/30/2014 0903   VLDL 32.0 06/30/2014 0903   LDLCALC 86 06/30/2014 0903      Wt Readings from Last 3 Encounters:  09/30/14 178 lb (80.74 kg)  09/21/14 176 lb 12.8 oz (80.196 kg)  03/23/14 180 lb 12.8 oz (82.01 kg)      Other studies Reviewed: Additional studies/ records that were reviewed today include: . Review of the above records demonstrates:    ASSESSMENT AND PLAN:  1. Coronary artery disease: Status post CABG in2001. He status post stenting of his mid right coronary artery in 10/17/2010.  His chest pains are somewhat concerning. His last cardiac catheterization in 2012 revealed a patent IMA and a patent graft to the bag. The graft to the RCA is known to be occluded. It's quite possible that he's having some angina related to residual ischemia in the inferior wall. Crease the isosorbide to 60 mg a day. I've encouraged him to take nitroglycerin if he has any further episodes of chest pain. I've encouraged him to also try Mylanta since he thinks that it might just be a gas pain. I will have her very low threshold to do a cardiac catheter station if he has continued pain. I'll see him again in 3 months.  2. Hypertension 3. Hypothyroidism 4. Hyperlipidemia 5. Diabetes mellitus   Current medicines are reviewed at length with the patient today.  The patient does not have concerns regarding medicines.  The following changes  have been made:  See above.    Disposition:   FU with me in 3 months    Signed, Johnavon Mcclafferty, [name], MD  12/04/2014 8:08 AM    [facility] [place], [place], [place]  [address] Phone: [phone]; Fax: [phone]

## 2014-12-04 NOTE — Patient Instructions (Addendum)
Medication Instructions:  INCREASE Imdur to 30 mg once daily  Labwork: None  Testing/Procedures: None  Follow-Up: Your physician recommends that you schedule a follow-up appointment in: 3 months with Dr. Acie Fredrickson

## 2015-03-03 ENCOUNTER — Ambulatory Visit (INDEPENDENT_AMBULATORY_CARE_PROVIDER_SITE_OTHER): Payer: Medicare Other | Admitting: Cardiovascular Disease

## 2015-03-03 ENCOUNTER — Encounter: Payer: Self-pay | Admitting: Cardiovascular Disease

## 2015-03-03 VITALS — BP 114/70 | HR 60 | Ht 66.0 in | Wt 180.8 lb

## 2015-03-03 DIAGNOSIS — I2510 Atherosclerotic heart disease of native coronary artery without angina pectoris: Secondary | ICD-10-CM | POA: Diagnosis not present

## 2015-03-03 DIAGNOSIS — E785 Hyperlipidemia, unspecified: Secondary | ICD-10-CM | POA: Diagnosis not present

## 2015-03-03 NOTE — Progress Notes (Signed)
Cardiology Office Note   Date:  03/03/2015   ID:  Benjamin Harrison, DOB 16-May-1935, MRN [id number]  PCP:  Leonard Downing, MD  Cardiologist:   Thayer Headings, MD   Chief Complaint  Patient presents with  . Follow-up    CAD   1. Coronary artery disease: Status post CABG in2001. He status post stenting of his mid right coronary artery in 10/17/2010 2. Hypertension 3. Hypothyroidism 4. Hyperlipidemia 5. Diabetes mellitus  History of Present Illness:  Benjamin Harrison is a 80 year old gentleman with a history of coronary artery disease. He status post PTCA and stenting of his right coronary. He had repeat stenting of his right coronary artery as recently as March, 2012. He had another heart attack and had severe stenosis of his distal right coronary artery stent. Dr. Martinique opened up the stent in March, 2013. Saphenous vein graft to right coronary artery is occluded.  Since that time he's had persistent headaches and generalized weakness. He's continued to have some episodes of chest pain. He's also had some episodes of lightheadedness.  He's feeling a bit better since I saw him several months ago. He still is not able to exercise with about 15 or 20 minutes. Is clear that he still eats salty foods. He enjoys peanut butter on Ritz crackers almost every night. He also eats occasional country ham and gravy biscuits.  October 07, 2012  He is doing well. He had a cath in November, 2013. He has been on Imdur and is feeling better. He occasionally has a headache.   Nov. 24, 2014:  Benjamin Harrison is having more dyspnea with exertion recently - walking outside, taking a shower.  He avoids salt.  He has not had much if any chest pain.   Feb. 24, 2015: Benjamin Harrison is doing ok. He was having some dyspnea but this has resolved.   March 23, 2014: He is doing ok. He stopped taking his atorvastatin due to leg aches. His aches and pains are better since in the atorvastatin. He has lots  of problems with arthritis and has taken steroids. Staying active except as limited by his joints    Feb. 22, 2016: Benjamin Harrison is a 80 y.o. male who presents for follow up of his CAD. He has been having some chest tightness and pain.  Occurred at night.  Was very weak for 3 days. Was just getting ready for bed.   Radiated out to his shoulder.  Occasionally has exertional CP / last for several minutes.  No palpitations,  No dizziness  ,  Does have some orthostatic hypotension.   Dec 04, 2014: Benjamin Harrison was seen recently for some chest pain   Myoview shows: Overall Impression: There is a small area of moderate scar affecting the apical cap and the apical lateral segment. There is no ischemia. This is a low risk scan. The description of the study from December, 2014 is slightly different from this study. However, after careful review, I am not convinced that there has been any change. There is question of an area of insignificant scar near the apex. There is no ischemia.  LV Ejection Fraction: 58%. LV Wall Motion: Normal Wall Motion   Echo shows: Left ventricle: The cavity size was normal. There was mild focal basal hypertrophy of the septum. Systolic function was normal. The estimated ejection fraction was in the range of 55% to 60%. Probable mild hypokinesis of the basal-midinferior myocardium. Left ventricular diastolic function parameters were normal. - Aortic valve: There  was trivial regurgitation.  Aug. 3, 2016:  Doing well. No CP.  We added Imdur at his last visit and is feeling much better.   Has DOE still His CP and dyspnea are better on the Imdur     Past Medical History  Diagnosis Date  . Coronary artery disease     a. CABG 2001 with early failure of VG-RCA. b. Inf MI after Lexiscan 01/28/2009 s/p RCA stent. c. Stent to dRCA 02/24/09. d. DES to [place]  2012. e. NSTEMI s/p angioplasty for ISR of RCA 09/2011. f. Severe distal diffuse dz likely cause of  angina by cath 05/2012.  Marland Kitchen Hypertension   . Hypothyroidism   . Hyperlipidemia   . Personal history of tobacco use     REMOTE IN THE PAST  . Diabetes mellitus   . Dyslipidemia   . Osteoarthritis     End-stage osteoarthritis, right knee, medial compartment    Past Surgical History  Procedure Laterality Date  . Coronary artery bypass graft    . Cardiac catheterization    . Knee arthroscopy  1999 and 2004  . Hernia repair  1985  . Left heart catheterization with coronary angiogram N/A 10/09/2011    Procedure: LEFT HEART CATHETERIZATION WITH CORONARY ANGIOGRAM;  Surgeon: Peter M Martinique, MD;  Location: [facility] CATH LAB;  Service: Cardiovascular;  Laterality: N/A;  . Left heart catheterization with coronary/graft angiogram N/A 06/06/2012    Procedure: LEFT HEART CATHETERIZATION WITH Beatrix Fetters;  Surgeon: Peter M Martinique, MD;  Location: [facility] CATH LAB;  Service: Cardiovascular;  Laterality: N/A;     Current Outpatient Prescriptions  Medication Sig Dispense Refill  . aspirin 81 MG tablet Take 1 tablet (81 mg total) by mouth daily.    Marland Kitchen atorvastatin (LIPITOR) 40 MG tablet Take 1 tablet (40 mg total) by mouth daily. 90 tablet 3  . clopidogrel (PLAVIX) 75 MG tablet Take 1 tablet (75 mg total) by mouth daily. 90 tablet 3  . Esomeprazole Magnesium (NEXIUM PO) Take 22.3 mg by mouth daily.    Marland Kitchen glimepiride (AMARYL) 4 MG tablet Take 4 mg by mouth daily with breakfast.    . hydrochlorothiazide (HYDRODIURIL) 25 MG tablet Take 1 tablet (25 mg total) by mouth daily. 90 tablet 3  . isosorbide mononitrate (IMDUR) 60 MG 24 hr tablet Take 1 tablet (60 mg total) by mouth daily. 90 tablet 3  . levothyroxine (SYNTHROID, LEVOTHROID) 100 MCG tablet Take 100 mcg by mouth daily.    . metFORMIN (GLUCOPHAGE) 1000 MG tablet Take by mouth daily with breakfast.     . metoprolol tartrate (LOPRESSOR) 25 MG tablet Take 1 tablet (25 mg total) by mouth 2 (two) times daily. 180 tablet 3  . nitroGLYCERIN (NITROSTAT) 0.4  MG SL tablet Place 1 tablet (0.4 mg total) under the tongue every 5 (five) minutes as needed. For chest pain. 25 tablet 6  . valsartan (DIOVAN) 80 MG tablet Take 1 tablet (80 mg total) by mouth daily. 90 tablet 4   No current facility-administered medications for this visit.    Allergies:   Codeine    Social History:  The patient  reports that he quit smoking about 39 years ago. He does not have any smokeless tobacco history on file. He reports that he does not drink alcohol or use illicit drugs.   Family History:  The patient's family history includes Cancer in his brother and sister; Heart disease in his brother and brother; Hypertension in his mother; Pneumonia in his  brother.    ROS:  Please see the history of present illness.    Review of Systems: Constitutional:  denies fever, chills, diaphoresis, appetite change and fatigue.  HEENT: denies photophobia, eye pain, redness, hearing loss, ear pain, congestion, sore throat, rhinorrhea, sneezing, neck pain, neck stiffness and tinnitus.  Respiratory: denies SOB, DOE, cough, chest tightness, and wheezing.  Cardiovascular: admits to chest pain,  , exertional CP   Gastrointestinal: denies nausea, vomiting, abdominal pain, diarrhea, constipation, blood in stool.  Genitourinary: denies dysuria, urgency, frequency, hematuria, flank pain and difficulty urinating.  Musculoskeletal: denies  myalgias, back pain, joint swelling, arthralgias and gait problem.   Skin: denies pallor, rash and wound.  Neurological: denies dizziness, seizures, syncope, weakness, light-headedness, numbness and headaches.   Hematological: denies adenopathy, easy bruising, personal or family bleeding history.  Psychiatric/ Behavioral: denies suicidal ideation, mood changes, confusion, nervousness, sleep disturbance and agitation.       All other systems are reviewed and negative.    PHYSICAL EXAM: VS:  BP 114/70 mmHg  Pulse 60  Ht 5\' 6"  (1.676 m)  Wt 82.01 kg  (180 lb 12.8 oz)  BMI 29.20 kg/m2  SpO2 96% , BMI Body mass index is 29.2 kg/(m^2). GEN: Well nourished, well developed, in no acute distress HEENT: normal Neck: no JVD, carotid bruits, or masses Cardiac: RRR; no murmurs, rubs, or gallops,no edema  Respiratory:  clear to auscultation bilaterally, normal work of breathing GI: soft, nontender, nondistended, + BS MS: no deformity or atrophy Skin: warm and dry, no rash Neuro:  Strength and sensation are intact Psych: normal   EKG:  EKG is not ordered today.    Recent Labs: 12/04/2014: ALT 30; BUN 14; Creatinine, Ser 1.19; Potassium 3.8; Sodium 139    Lipid Panel    Component Value Date/Time   CHOL 119 12/04/2014 0738   TRIG 96.0 12/04/2014 0738   HDL 30.50* 12/04/2014 0738   CHOLHDL 4 12/04/2014 0738   VLDL 19.2 12/04/2014 0738   LDLCALC 69 12/04/2014 0738      Wt Readings from Last 3 Encounters:  03/03/15 82.01 kg (180 lb 12.8 oz)  12/04/14 80.287 kg (177 lb)  09/30/14 80.74 kg (178 lb)      Other studies Reviewed: Additional studies/ records that were reviewed today include: . Review of the above records demonstrates:    ASSESSMENT AND PLAN:  1. Coronary artery disease: Status post CABG in2001. He status post stenting of his mid right coronary artery in 10/17/2010.  His chest pains are somewhat concerning. His last cardiac catheterization in 2012 revealed a patent IMA and a patent graft to the bag. The graft to the RCA is known to be occluded. It's quite possible that he's having some angina related to residual ischemia in the inferior wall. He is better on the Imdur.  Continue current meds  Will see in 6 months   2. Hypertension - BP is well controlled.   3. Hypothyroidism -  Followed by his medical doctor   4. Hyperlipidemia - will check lipids in 6 months .  5. Diabetes mellitus   Current medicines are reviewed at length with the patient today.  The patient does not have concerns regarding  medicines.  The following changes have been made:  See above.    Disposition:   FU with me in 6 months    Signed, [name], [name], MD  03/03/2015 8:03 AM    [place], [place]  [address] Phone: [phone]; Fax: [phone]

## 2015-03-03 NOTE — Patient Instructions (Signed)

## 2015-05-31 ENCOUNTER — Other Ambulatory Visit: Payer: Self-pay | Admitting: Cardiovascular Disease

## 2015-08-30 ENCOUNTER — Other Ambulatory Visit (INDEPENDENT_AMBULATORY_CARE_PROVIDER_SITE_OTHER): Payer: Medicare Other | Admitting: *Deleted

## 2015-08-30 ENCOUNTER — Ambulatory Visit (INDEPENDENT_AMBULATORY_CARE_PROVIDER_SITE_OTHER): Payer: Medicare Other | Admitting: Cardiovascular Disease

## 2015-08-30 ENCOUNTER — Encounter: Payer: Self-pay | Admitting: Cardiovascular Disease

## 2015-08-30 VITALS — BP 130/78 | HR 60 | Ht 66.0 in | Wt 172.0 lb

## 2015-08-30 DIAGNOSIS — I2510 Atherosclerotic heart disease of native coronary artery without angina pectoris: Secondary | ICD-10-CM | POA: Diagnosis not present

## 2015-08-30 DIAGNOSIS — E785 Hyperlipidemia, unspecified: Secondary | ICD-10-CM

## 2015-08-30 LAB — BASIC METABOLIC PANEL
BUN: 18 mg/dL (ref 7–25)
CHLORIDE: 104 mmol/L (ref 98–110)
CO2: 26 mmol/L (ref 20–31)
Calcium: 8.7 mg/dL (ref 8.6–10.3)
Creat: 1.08 mg/dL (ref 0.70–1.18)
Glucose, Bld: 145 mg/dL — ABNORMAL HIGH (ref 65–99)
POTASSIUM: 3.9 mmol/L (ref 3.5–5.3)
Sodium: 139 mmol/L (ref 135–146)

## 2015-08-30 LAB — LIPID PANEL
CHOL/HDL RATIO: 3.2 ratio (ref <=5.0)
CHOLESTEROL: 79 mg/dL — AB (ref 125–200)
HDL: 25 mg/dL — ABNORMAL LOW (ref >=40)
LDL CALC: 44 mg/dL (ref <130)
TRIGLYCERIDES: 49 mg/dL (ref <150)
VLDL: 10 mg/dL (ref <30)

## 2015-08-30 LAB — HEPATIC FUNCTION PANEL
ALBUMIN: 3.4 g/dL — AB (ref 3.6–5.1)
ALT: 31 U/L (ref 9–46)
AST: 24 U/L (ref 10–35)
Alkaline Phosphatase: 69 U/L (ref 40–115)
Bilirubin, Direct: 0.3 mg/dL — ABNORMAL HIGH (ref <=0.2)
Indirect Bilirubin: 0.8 mg/dL (ref 0.2–1.2)
TOTAL PROTEIN: 6.3 g/dL (ref 6.1–8.1)
Total Bilirubin: 1.1 mg/dL (ref 0.2–1.2)

## 2015-08-30 MED ORDER — NITROGLYCERIN 0.4 MG SL SUBL: 0.4000 mg | SUBLINGUAL_TABLET | SUBLINGUAL | Status: DC | PRN

## 2015-08-30 NOTE — Addendum Note (Signed)
Addended by: Eulis Foster on: 08/30/2015 08:05 AM   Modules accepted: Orders

## 2015-08-30 NOTE — Patient Instructions (Signed)

## 2015-08-30 NOTE — Addendum Note (Signed)
Addended by: Eulis Foster on: 08/30/2015 08:04 AM   Modules accepted: Orders

## 2015-08-30 NOTE — Progress Notes (Signed)
Cardiology Office Note   Date:  08/30/2015   ID:  Benjamin Harrison, DOB 15-Nov-1934, MRN [id number]  PCP:  Leonard Downing, MD  Cardiologist:   Thayer Headings, MD   Chief Complaint  Patient presents with  . Follow-up  . Coronary Artery Disease   1. Coronary artery disease: Status post CABG in2001. He status post stenting of his mid right coronary artery in 10/17/2010 2. Hypertension 3. Hypothyroidism 4. Hyperlipidemia 5. Diabetes mellitus  History of Present Illness:  Benjamin Harrison is a 81 year old gentleman with a history of coronary artery disease. He status post PTCA and stenting of his right coronary. He had repeat stenting of his right coronary artery as recently as March, 2012. He had another heart attack and had severe stenosis of his distal right coronary artery stent. Dr. Martinique opened up the stent in March, 2013. Saphenous vein graft to right coronary artery is occluded.  Since that time he's had persistent headaches and generalized weakness. He's continued to have some episodes of chest pain. He's also had some episodes of lightheadedness.  He's feeling a bit better since I saw him several months ago. He still is not able to exercise with about 15 or 20 minutes. Is clear that he still eats salty foods. He enjoys peanut butter on Ritz crackers almost every night. He also eats occasional country ham and gravy biscuits.  October 07, 2012  He is doing well. He had a cath in November, 2013. He has been on Imdur and is feeling better. He occasionally has a headache.   Nov. 24, 2014:  Benjamin Harrison is having more dyspnea with exertion recently - walking outside, taking a shower.  He avoids salt.  He has not had much if any chest pain.   Feb. 24, 2015: Benjamin Harrison is doing ok. He was having some dyspnea but this has resolved.   March 23, 2014: He is doing ok. He stopped taking his atorvastatin due to leg aches. His aches and pains are better since in the  atorvastatin. He has lots of problems with arthritis and has taken steroids. Staying active except as limited by his joints    Feb. 22, 2016: Benjamin Harrison is a 81 y.o. male who presents for follow up of his CAD. He has been having some chest tightness and pain.  Occurred at night.  Was very weak for 3 days. Was just getting ready for bed.   Radiated out to his shoulder.  Occasionally has exertional CP / last for several minutes.  No palpitations,  No dizziness  ,  Does have some orthostatic hypotension.   Dec 04, 2014: Benjamin Harrison was seen recently for some chest pain   Myoview shows: Overall Impression: There is a small area of moderate scar affecting the apical cap and the apical lateral segment. There is no ischemia. This is a low risk scan. The description of the study from December, 2014 is slightly different from this study. However, after careful review, I am not convinced that there has been any change. There is question of an area of insignificant scar near the apex. There is no ischemia.  LV Ejection Fraction: 58%. LV Wall Motion: Normal Wall Motion   Echo shows: Left ventricle: The cavity size was normal. There was mild focal basal hypertrophy of the septum. Systolic function was normal. The estimated ejection fraction was in the range of 55% to 60%. Probable mild hypokinesis of the basal-midinferior myocardium. Left ventricular diastolic function parameters were normal. - Aortic valve:  There was trivial regurgitation.  Aug. 3, 2016:  Doing well. No CP.  We added Imdur at his last visit and is feeling much better.   Has DOE still His CP and dyspnea are better on the Imdur   Jan. 30, 2017:  Doing well.  Occasional CP .  Takes NTG on occasion .  Perhaps once a month   Past Medical History  Diagnosis Date  . Coronary artery disease     a. CABG 2001 with early failure of VG-RCA. b. Inf MI after Lexiscan 01/28/2009 s/p RCA stent. c. Stent to dRCA 02/24/09. d.  DES to [place]  2012. e. NSTEMI s/p angioplasty for ISR of RCA 09/2011. f. Severe distal diffuse dz likely cause of angina by cath 05/2012.  Marland Kitchen Hypertension   . Hypothyroidism   . Hyperlipidemia   . Personal history of tobacco use     REMOTE IN THE PAST  . Diabetes mellitus   . Dyslipidemia   . Osteoarthritis     End-stage osteoarthritis, right knee, medial compartment    Past Surgical History  Procedure Laterality Date  . Coronary artery bypass graft    . Cardiac catheterization    . Knee arthroscopy  1999 and 2004  . Hernia repair  1985  . Left heart catheterization with coronary angiogram N/A 10/09/2011    Procedure: LEFT HEART CATHETERIZATION WITH CORONARY ANGIOGRAM;  Surgeon: Peter M Martinique, MD;  Location: [facility] CATH LAB;  Service: Cardiovascular;  Laterality: N/A;  . Left heart catheterization with coronary/graft angiogram N/A 06/06/2012    Procedure: LEFT HEART CATHETERIZATION WITH Beatrix Fetters;  Surgeon: Peter M Martinique, MD;  Location: [facility] CATH LAB;  Service: Cardiovascular;  Laterality: N/A;     Current Outpatient Prescriptions  Medication Sig Dispense Refill  . aspirin 81 MG tablet Take 1 tablet (81 mg total) by mouth daily.    Marland Kitchen atorvastatin (LIPITOR) 40 MG tablet Take 1 tablet by mouth  daily 90 tablet 3  . clopidogrel (PLAVIX) 75 MG tablet Take 1 tablet by mouth  daily 90 tablet 3  . Esomeprazole Magnesium (NEXIUM PO) Take 22.3 mg by mouth daily.    Marland Kitchen glimepiride (AMARYL) 4 MG tablet Take 4 mg by mouth daily with breakfast.    . hydrochlorothiazide (HYDRODIURIL) 25 MG tablet Take 1 tablet (25 mg total) by mouth daily. 90 tablet 3  . isosorbide mononitrate (IMDUR) 60 MG 24 hr tablet Take 1 tablet (60 mg total) by mouth daily. 90 tablet 3  . levothyroxine (SYNTHROID, LEVOTHROID) 100 MCG tablet Take 100 mcg by mouth daily.    . metFORMIN (GLUCOPHAGE) 1000 MG tablet Take by mouth daily with breakfast.     . metoprolol tartrate (LOPRESSOR) 25 MG tablet Take 1 tablet  by mouth two  times daily 180 tablet 3  . nitroGLYCERIN (NITROSTAT) 0.4 MG SL tablet Place 1 tablet (0.4 mg total) under the tongue every 5 (five) minutes as needed. For chest pain. 25 tablet 6  . valsartan (DIOVAN) 80 MG tablet Take 1 tablet (80 mg total) by mouth daily. 90 tablet 4   No current facility-administered medications for this visit.    Allergies:   Codeine    Social History:  The patient  reports that he quit smoking about 40 years ago. He does not have any smokeless tobacco history on file. He reports that he does not drink alcohol or use illicit drugs.   Family History:  The patient's family history includes Cancer in his brother and  sister; Heart disease in his brother and brother; Hypertension in his mother; Pneumonia in his brother.    ROS:  Please see the history of present illness.    Review of Systems: Constitutional:  denies fever, chills, diaphoresis, appetite change and fatigue.  HEENT: denies photophobia, eye pain, redness, hearing loss, ear pain, congestion, sore throat, rhinorrhea, sneezing, neck pain, neck stiffness and tinnitus.  Respiratory: denies SOB, DOE, cough, chest tightness, and wheezing.  Cardiovascular: admits to chest pain,  , exertional CP   Gastrointestinal: denies nausea, vomiting, abdominal pain, diarrhea, constipation, blood in stool.  Genitourinary: denies dysuria, urgency, frequency, hematuria, flank pain and difficulty urinating.  Musculoskeletal: denies  myalgias, back pain, joint swelling, arthralgias and gait problem.   Skin: denies pallor, rash and wound.  Neurological: denies dizziness, seizures, syncope, weakness, light-headedness, numbness and headaches.   Hematological: denies adenopathy, easy bruising, personal or family bleeding history.  Psychiatric/ Behavioral: denies suicidal ideation, mood changes, confusion, nervousness, sleep disturbance and agitation.       All other systems are reviewed and negative.    PHYSICAL  EXAM: VS:  BP 130/78 mmHg  Pulse 60  Ht 5\' 6"  (1.676 m)  Wt 172 lb (78.019 kg)  BMI 27.77 kg/m2  SpO2 97% , BMI Body mass index is 27.77 kg/(m^2). GEN: Well nourished, well developed, in no acute distress HEENT: normal Neck: no JVD, carotid bruits, or masses Cardiac: RRR; no murmurs, rubs, or gallops,no edema  Respiratory:  clear to auscultation bilaterally, normal work of breathing GI: soft, nontender, nondistended, + BS MS: no deformity or atrophy Skin: warm and dry, no rash Neuro:  Strength and sensation are intact Psych: normal   EKG:  EKG is ordered today. Sinus brady 56.   Otherwise normal .    Recent Labs: 12/04/2014: ALT 30; BUN 14; Creatinine, Ser 1.19; Potassium 3.8; Sodium 139    Lipid Panel    Component Value Date/Time   CHOL 119 12/04/2014 0738   TRIG 96.0 12/04/2014 0738   HDL 30.50* 12/04/2014 0738   CHOLHDL 4 12/04/2014 0738   VLDL 19.2 12/04/2014 0738   LDLCALC 69 12/04/2014 0738      Wt Readings from Last 3 Encounters:  08/30/15 172 lb (78.019 kg)  03/03/15 180 lb 12.8 oz (82.01 kg)  12/04/14 177 lb (80.287 kg)      Other studies Reviewed: Additional studies/ records that were reviewed today include: . Review of the above records demonstrates:    ASSESSMENT AND PLAN:  1. Coronary artery disease: Status post CABG in2001. He status post stenting of his mid right coronary artery in 10/17/2010.  His chest pains are somewhat concerning. His last cardiac catheterization in 2012 revealed a patent IMA and a patent graft to the bag. The graft to the RCA is known to be occluded. It's quite possible that he's having some angina related to residual ischemia in the inferior wall. He is better on the Imdur.  Continue current meds . He still has occasional CP - seems to be stable .   We discussed the fact that he should call me if this CP gets worse.  Will see in 6 months   2. Hypertension - BP is well controlled.   3. Hypothyroidism -  Followed by his  medical doctor   4. Hyperlipidemia - will check lipids in 6 months .  5. Diabetes mellitus   Current medicines are reviewed at length with the patient today.  The patient does not have concerns regarding medicines.  The following changes have been made:  See above.    Disposition:   FU with me in 6 months    Signed, [name], [name], MD  08/30/2015 8:28 AM    [facility] [place], [place], [place]  [address] Phone: [phone]; Fax: [phone]

## 2015-10-05 ENCOUNTER — Other Ambulatory Visit: Payer: Self-pay | Admitting: Cardiovascular Disease

## 2015-10-26 ENCOUNTER — Other Ambulatory Visit: Payer: Self-pay | Admitting: Family Medicine

## 2015-10-26 DIAGNOSIS — R519 Headache, unspecified: Secondary | ICD-10-CM

## 2015-10-26 DIAGNOSIS — R51 Headache: Principal | ICD-10-CM

## 2015-11-04 ENCOUNTER — Ambulatory Visit
Admission: RE | Admit: 2015-11-04 | Discharge: 2015-11-04 | Disposition: A | Discharge status: Home / Self Care | Payer: Medicare Other | Source: Ambulatory Visit | Attending: Family Medicine | Admitting: Family Medicine

## 2015-11-04 DIAGNOSIS — R519 Headache, unspecified: Secondary | ICD-10-CM

## 2015-11-04 DIAGNOSIS — R51 Headache: Principal | ICD-10-CM

## 2015-12-05 ENCOUNTER — Encounter (HOSPITAL_COMMUNITY): Payer: Self-pay | Admitting: Emergency Medicine

## 2015-12-05 ENCOUNTER — Emergency Department (HOSPITAL_COMMUNITY): Payer: Medicare Other

## 2015-12-05 ENCOUNTER — Observation Stay (HOSPITAL_COMMUNITY): Payer: Medicare Other

## 2015-12-05 ENCOUNTER — Inpatient Hospital Stay (HOSPITAL_COMMUNITY)
Admission: EM | Admit: 2015-12-05 | Discharge: 2015-12-07 | DRG: 251 | Disposition: A | Discharge status: Home / Self Care | Payer: Medicare Other | Attending: Surgery | Admitting: Surgery

## 2015-12-05 DIAGNOSIS — Z9861 Coronary angioplasty status: Secondary | ICD-10-CM

## 2015-12-05 DIAGNOSIS — I25110 Atherosclerotic heart disease of native coronary artery with unstable angina pectoris: Secondary | ICD-10-CM | POA: Diagnosis present

## 2015-12-05 DIAGNOSIS — Z7984 Long term (current) use of oral hypoglycemic drugs: Secondary | ICD-10-CM

## 2015-12-05 DIAGNOSIS — E119 Type 2 diabetes mellitus without complications: Secondary | ICD-10-CM | POA: Insufficient documentation

## 2015-12-05 DIAGNOSIS — K30 Functional dyspepsia: Secondary | ICD-10-CM

## 2015-12-05 DIAGNOSIS — E785 Hyperlipidemia, unspecified: Secondary | ICD-10-CM | POA: Diagnosis present

## 2015-12-05 DIAGNOSIS — I252 Old myocardial infarction: Secondary | ICD-10-CM

## 2015-12-05 DIAGNOSIS — Z951 Presence of aortocoronary bypass graft: Secondary | ICD-10-CM

## 2015-12-05 DIAGNOSIS — Z7982 Long term (current) use of aspirin: Secondary | ICD-10-CM

## 2015-12-05 DIAGNOSIS — Z7902 Long term (current) use of antithrombotics/antiplatelets: Secondary | ICD-10-CM

## 2015-12-05 DIAGNOSIS — Z87891 Personal history of nicotine dependence: Secondary | ICD-10-CM

## 2015-12-05 DIAGNOSIS — E039 Hypothyroidism, unspecified: Secondary | ICD-10-CM | POA: Diagnosis present

## 2015-12-05 DIAGNOSIS — R079 Chest pain, unspecified: Secondary | ICD-10-CM | POA: Diagnosis not present

## 2015-12-05 DIAGNOSIS — T82855A Stenosis of coronary artery stent, initial encounter: Secondary | ICD-10-CM | POA: Diagnosis not present

## 2015-12-05 DIAGNOSIS — I200 Unstable angina: Secondary | ICD-10-CM | POA: Diagnosis present

## 2015-12-05 DIAGNOSIS — R072 Precordial pain: Secondary | ICD-10-CM

## 2015-12-05 DIAGNOSIS — Y832 Surgical operation with anastomosis, bypass or graft as the cause of abnormal reaction of the patient, or of later complication, without mention of misadventure at the time of the procedure: Secondary | ICD-10-CM | POA: Diagnosis present

## 2015-12-05 DIAGNOSIS — M1990 Unspecified osteoarthritis, unspecified site: Secondary | ICD-10-CM | POA: Diagnosis present

## 2015-12-05 DIAGNOSIS — I119 Hypertensive heart disease without heart failure: Secondary | ICD-10-CM | POA: Diagnosis present

## 2015-12-05 DIAGNOSIS — I10 Essential (primary) hypertension: Secondary | ICD-10-CM | POA: Diagnosis present

## 2015-12-05 DIAGNOSIS — E860 Dehydration: Secondary | ICD-10-CM | POA: Diagnosis present

## 2015-12-05 DIAGNOSIS — Z79899 Other long term (current) drug therapy: Secondary | ICD-10-CM

## 2015-12-05 HISTORY — DX: Heart failure, unspecified: I50.9

## 2015-12-05 HISTORY — DX: Hypertensive heart disease without heart failure: I11.9

## 2015-12-05 HISTORY — DX: Type 2 diabetes mellitus without complications: E11.9

## 2015-12-05 HISTORY — DX: Personal history of other medical treatment: Z92.89

## 2015-12-05 LAB — BASIC METABOLIC PANEL
Anion gap: 12 (ref 5–15)
BUN: 28 mg/dL — AB (ref 6–20)
CHLORIDE: 104 mmol/L (ref 101–111)
CO2: 22 mmol/L (ref 22–32)
Calcium: 9.1 mg/dL (ref 8.9–10.3)
Creatinine, Ser: 1.07 mg/dL (ref 0.61–1.24)
GFR calc non Af Amer: >60 mL/min (ref >60)
Glucose, Bld: 183 mg/dL — ABNORMAL HIGH (ref 65–99)
POTASSIUM: 4.1 mmol/L (ref 3.5–5.1)
SODIUM: 138 mmol/L (ref 135–145)

## 2015-12-05 LAB — CBC
HEMATOCRIT: 45.4 % (ref 39.0–52.0)
HEMATOCRIT: 45.4 % (ref 39.0–52.0)
HEMOGLOBIN: 14.8 g/dL (ref 13.0–17.0)
Hemoglobin: 14.9 g/dL (ref 13.0–17.0)
MCH: 24.7 pg — AB (ref 26.0–34.0)
MCH: 25.2 pg — AB (ref 26.0–34.0)
MCHC: 32.8 g/dL (ref 30.0–36.0)
MCHC: 32.6 g/dL (ref 30.0–36.0)
MCV: 75.3 fL — AB (ref 78.0–100.0)
MCV: 77.2 fL — AB (ref 78.0–100.0)
Platelets: 189 10*3/uL (ref 150–400)
Platelets: 211 10*3/uL (ref 150–400)
RBC: 6.03 MIL/uL — AB (ref 4.22–5.81)
RBC: 5.88 MIL/uL — AB (ref 4.22–5.81)
RDW: 15.4 % (ref 11.5–15.5)
RDW: 15.7 % — ABNORMAL HIGH (ref 11.5–15.5)
WBC: 5.3 10*3/uL (ref 4.0–10.5)
WBC: 4.8 10*3/uL (ref 4.0–10.5)

## 2015-12-05 LAB — HEPATIC FUNCTION PANEL
ALBUMIN: 3.5 g/dL (ref 3.5–5.0)
ALK PHOS: 58 U/L (ref 38–126)
ALT: 31 U/L (ref 17–63)
AST: 24 U/L (ref 15–41)
Bilirubin, Direct: 0.3 mg/dL (ref 0.1–0.5)
Indirect Bilirubin: 1.6 mg/dL — ABNORMAL HIGH (ref 0.3–0.9)
TOTAL PROTEIN: 7.1 g/dL (ref 6.5–8.1)
Total Bilirubin: 1.9 mg/dL — ABNORMAL HIGH (ref 0.3–1.2)

## 2015-12-05 LAB — CREATININE, SERUM
Creatinine, Ser: 1.15 mg/dL (ref 0.61–1.24)
GFR calc Af Amer: >60 mL/min (ref >60)
GFR calc non Af Amer: 59 mL/min — ABNORMAL LOW (ref >60)

## 2015-12-05 LAB — I-STAT TROPONIN, ED: Troponin i, poc: 0 ng/mL (ref 0.00–0.08)

## 2015-12-05 LAB — TROPONIN I

## 2015-12-05 LAB — D-DIMER, QUANTITATIVE: D-Dimer, Quant: 1.69 ug/mL-FEU — ABNORMAL HIGH (ref 0.00–0.50)

## 2015-12-05 LAB — GLUCOSE, CAPILLARY: Glucose-Capillary: 154 mg/dL — ABNORMAL HIGH (ref 65–99)

## 2015-12-05 LAB — LIPASE, BLOOD: Lipase: 32 U/L (ref 11–51)

## 2015-12-05 MED ORDER — IOPAMIDOL (ISOVUE-370) INJECTION 76%
INTRAVENOUS | Status: AC
  Administered 2015-12-05: 100 mL
  Filled 2015-12-05: qty 100

## 2015-12-05 MED ORDER — GLIMEPIRIDE 4 MG PO TABS
4.0000 mg | ORAL_TABLET | Freq: Every day | ORAL | Status: DC
  Administered 2015-12-07: 4 mg via ORAL
  Filled 2015-12-05: qty 1

## 2015-12-05 MED ORDER — ASPIRIN 81 MG PO CHEW
324.0000 mg | CHEWABLE_TABLET | Freq: Once | ORAL | Status: AC
  Administered 2015-12-05: 324 mg via ORAL
  Filled 2015-12-05: qty 4

## 2015-12-05 MED ORDER — NITROGLYCERIN 2 % TD OINT
1.0000 [in_us] | TOPICAL_OINTMENT | Freq: Four times a day (QID) | TRANSDERMAL | Status: DC
  Filled 2015-12-05: qty 30

## 2015-12-05 MED ORDER — HEPARIN SODIUM (PORCINE) 5000 UNIT/ML IJ SOLN
5000.0000 [IU] | Freq: Three times a day (TID) | INTRAMUSCULAR | Status: DC
  Administered 2015-12-05: 5000 [IU] via SUBCUTANEOUS
  Filled 2015-12-05 (×2): qty 1

## 2015-12-05 MED ORDER — ACETAMINOPHEN 325 MG PO TABS: 650.0000 mg | ORAL_TABLET | ORAL | Status: DC | PRN

## 2015-12-05 MED ORDER — NITROGLYCERIN 0.4 MG SL SUBL: 0.4000 mg | SUBLINGUAL_TABLET | SUBLINGUAL | Status: DC | PRN

## 2015-12-05 MED ORDER — NITROGLYCERIN 0.4 MG SL SUBL
0.4000 mg | SUBLINGUAL_TABLET | SUBLINGUAL | Status: AC | PRN
  Administered 2015-12-05 (×3): 0.4 mg via SUBLINGUAL
  Filled 2015-12-05: qty 1

## 2015-12-05 MED ORDER — ONDANSETRON HCL 4 MG/2ML IJ SOLN: 4.0000 mg | Freq: Four times a day (QID) | INTRAMUSCULAR | Status: DC | PRN

## 2015-12-05 MED ORDER — LEVOTHYROXINE SODIUM 100 MCG PO TABS
100.0000 ug | ORAL_TABLET | Freq: Every day | ORAL | Status: DC
  Administered 2015-12-06 – 2015-12-07 (×2): 100 ug via ORAL
  Filled 2015-12-05 (×3): qty 1

## 2015-12-05 MED ORDER — METOPROLOL TARTRATE 25 MG PO TABS
25.0000 mg | ORAL_TABLET | Freq: Two times a day (BID) | ORAL | Status: DC
  Administered 2015-12-05 – 2015-12-07 (×4): 25 mg via ORAL
  Filled 2015-12-05 (×4): qty 1

## 2015-12-05 MED ORDER — IRBESARTAN 75 MG PO TABS
75.0000 mg | ORAL_TABLET | Freq: Every day | ORAL | Status: DC
  Administered 2015-12-05 – 2015-12-07 (×2): 75 mg via ORAL
  Filled 2015-12-05 (×2): qty 1

## 2015-12-05 MED ORDER — ATORVASTATIN CALCIUM 40 MG PO TABS
40.0000 mg | ORAL_TABLET | Freq: Every day | ORAL | Status: DC
  Administered 2015-12-06: 21:00:00 40 mg via ORAL
  Filled 2015-12-05: qty 1

## 2015-12-05 MED ORDER — SUCRALFATE 1 GM/10ML PO SUSP
1.0000 g | Freq: Four times a day (QID) | ORAL | Status: DC
  Administered 2015-12-05 – 2015-12-07 (×5): 1 g via ORAL
  Filled 2015-12-05 (×7): qty 10

## 2015-12-05 MED ORDER — PANTOPRAZOLE SODIUM 40 MG PO TBEC
40.0000 mg | DELAYED_RELEASE_TABLET | Freq: Every day | ORAL | Status: DC
  Administered 2015-12-05 – 2015-12-07 (×3): 40 mg via ORAL
  Filled 2015-12-05 (×3): qty 1

## 2015-12-05 MED ORDER — SODIUM CHLORIDE 0.9 % IV BOLUS (SEPSIS)
500.0000 mL | Freq: Once | INTRAVENOUS | Status: AC
  Administered 2015-12-05: 500 mL via INTRAVENOUS

## 2015-12-05 MED ORDER — CLOPIDOGREL BISULFATE 75 MG PO TABS
75.0000 mg | ORAL_TABLET | Freq: Every day | ORAL | Status: DC
  Administered 2015-12-05 – 2015-12-07 (×3): 75 mg via ORAL
  Filled 2015-12-05 (×3): qty 1

## 2015-12-05 MED ORDER — ONDANSETRON HCL 4 MG/2ML IJ SOLN
4.0000 mg | Freq: Once | INTRAMUSCULAR | Status: AC
  Administered 2015-12-05: 4 mg via INTRAVENOUS
  Filled 2015-12-05: qty 2

## 2015-12-05 MED ORDER — INSULIN ASPART 100 UNIT/ML ~~LOC~~ SOLN
0.0000 [IU] | Freq: Three times a day (TID) | SUBCUTANEOUS | Status: DC
  Administered 2015-12-07: 2 [IU] via SUBCUTANEOUS

## 2015-12-05 NOTE — ED Notes (Signed)
Pain in epigastric area; started at 0300 after vomiting at 0200. "Heavy" feeling in center of chest.

## 2015-12-05 NOTE — ED Notes (Signed)
Dr. Regenia Skeeter MD at bedside.

## 2015-12-05 NOTE — ED Provider Notes (Signed)
CSN: [id number]     Arrival date & time 12/05/15  1222 History   First MD Initiated Contact with Patient 12/05/15 1337     Chief Complaint  Patient presents with  . Chest Pain     (Consider location/radiation/quality/duration/timing/severity/associated sxs/prior Treatment) HPI  81 year old male presents with epigastric pain/heartburn as well as chest pressure and mid back pain. States around 2 or 3 in the morning he started having the indigestion feeling in his upper abdomen/lower chest. At around 5 AM he developed the chest pain and back pain. Also short of breath, nauseated, and has had vomiting. Feels similar to multiple prior cardiac issues. Recently had a stent in 2013. Multiple prior stents and a prior CABG. No leg swelling or leg pain. Rates his pain as moderate, 4/10. Did not try nitroglycerin but has been taking his Imdur as prescribed.  Past Medical History  Diagnosis Date  . Coronary artery disease     a. CABG 2001 with early failure of VG-RCA. b. Inf MI after Lexiscan 01/28/2009 s/p RCA stent. c. Stent to dRCA 02/24/09. d. DES to [place]  2012. e. NSTEMI s/p angioplasty for ISR of RCA 09/2011. f. Severe distal diffuse dz likely cause of angina by cath 05/2012.  Marland Kitchen Hypertension   . Hypothyroidism   . Hyperlipidemia   . Personal history of tobacco use     REMOTE IN THE PAST  . Diabetes mellitus   . Dyslipidemia   . Osteoarthritis     End-stage osteoarthritis, right knee, medial compartment   Past Surgical History  Procedure Laterality Date  . Coronary artery bypass graft    . Cardiac catheterization    . Knee arthroscopy  1999 and 2004  . Hernia repair  1985  . Left heart catheterization with coronary angiogram N/A 10/09/2011    Procedure: LEFT HEART CATHETERIZATION WITH CORONARY ANGIOGRAM;  Surgeon: Peter M Martinique, MD;  Location: [facility] CATH LAB;  Service: Cardiovascular;  Laterality: N/A;  . Left heart catheterization with coronary/graft angiogram N/A 06/06/2012    Procedure:  LEFT HEART CATHETERIZATION WITH Beatrix Fetters;  Surgeon: Peter M Martinique, MD;  Location: [facility] CATH LAB;  Service: Cardiovascular;  Laterality: N/A;   Family History  Problem Relation Age of Onset  . Hypertension Mother   . Cancer Sister     Throat Cancer  . Heart disease Brother   . Pneumonia Brother   . Heart disease Brother   . Cancer Brother     Prostate Cancer   Social History  Substance Use Topics  . Smoking status: Former Smoker    Quit date: 08/01/1975  . Smokeless tobacco: None  . Alcohol Use: No    Review of Systems  Respiratory: Positive for shortness of breath.   Cardiovascular: Positive for chest pain. Negative for leg swelling.  Gastrointestinal: Positive for nausea, vomiting and abdominal pain.  Musculoskeletal: Positive for back pain.  All other systems reviewed and are negative.     Allergies  Codeine  Home Medications   Prior to Admission medications   Medication Sig Start Date End Date Taking? Authorizing Provider  aspirin 81 MG tablet Take 1 tablet (81 mg total) by mouth daily. 10/10/11  Yes Rogelia Mire, NP  atorvastatin (LIPITOR) 40 MG tablet Take 1 tablet by mouth  daily 06/01/15  Yes Thayer Headings, MD  clopidogrel (PLAVIX) 75 MG tablet Take 1 tablet by mouth  daily 06/01/15  Yes Thayer Headings, MD  Esomeprazole Magnesium (NEXIUM PO) Take 22.3 mg  by mouth daily.   Yes Historical Provider, MD  glimepiride (AMARYL) 4 MG tablet Take 4 mg by mouth daily with breakfast.   Yes Historical Provider, MD  hydrochlorothiazide (HYDRODIURIL) 25 MG tablet Take 1 tablet (25 mg total) by mouth daily. 12/19/13  Yes Thayer Headings, MD  isosorbide mononitrate (IMDUR) 60 MG 24 hr tablet Take 1 tablet by mouth  daily 10/05/15  Yes Thayer Headings, MD  levothyroxine (SYNTHROID, LEVOTHROID) 100 MCG tablet Take 100 mcg by mouth daily.   Yes Historical Provider, MD  metFORMIN (GLUCOPHAGE) 1000 MG tablet Take by mouth daily with breakfast.  03/13/14  Yes  Historical Provider, MD  metoprolol tartrate (LOPRESSOR) 25 MG tablet Take 1 tablet by mouth two  times daily 06/01/15  Yes Thayer Headings, MD  nitroGLYCERIN (NITROSTAT) 0.4 MG SL tablet Place 1 tablet (0.4 mg total) under the tongue every 5 (five) minutes as needed. For chest pain. 08/30/15  Yes Thayer Headings, MD  valsartan (DIOVAN) 80 MG tablet Take 1 tablet by mouth  daily 10/05/15  Yes Thayer Headings, MD   BP 155/97 mmHg  Pulse 98  Temp(Src) 97.5 F (36.4 C) (Oral)  Resp 18  Ht 5\' 5"  (1.651 m)  SpO2 94% Physical Exam  Constitutional: He is oriented to person, place, and time. He appears well-developed and well-nourished.  HENT:  Head: Normocephalic and atraumatic.  Right Ear: External ear normal.  Left Ear: External ear normal.  Nose: Nose normal.  Eyes: Right eye exhibits no discharge. Left eye exhibits no discharge.  Neck: Neck supple.  Cardiovascular: Regular rhythm, normal heart sounds and intact distal pulses.  Tachycardia present.   Pulses:      Radial pulses are 2+ on the right side, and 2+ on the left side.  Pulmonary/Chest: Effort normal and breath sounds normal.  Abdominal: Soft. There is tenderness (mild) in the epigastric area.  Musculoskeletal: He exhibits no edema.  Neurological: He is alert and oriented to person, place, and time.  Skin: Skin is warm and dry.  Nursing note and vitals reviewed.   ED Course  Procedures (including critical care time) Labs Review Labs Reviewed  BASIC METABOLIC PANEL - Abnormal; Notable for the following:    Glucose, Bld 183 (*)    BUN 28 (*)    All other components within normal limits  CBC - Abnormal; Notable for the following:    RBC 6.03 (*)    MCV 75.3 (*)    MCH 24.7 (*)    All other components within normal limits  I-STAT TROPOININ, ED    Imaging Review Dg Chest 2 View  12/05/2015  CLINICAL DATA:  Onset of vomiting at 0200 hours today, epigastric pain, dizziness, chest heaviness, shortness of breath, history  hypertension, coronary artery disease post MI coronary stenting and CABG, former smoker EXAM: CHEST  2 VIEW COMPARISON:  11/03/2013 FINDINGS: Upper normal heart size post CABG. Atherosclerotic calcification aorta. Mediastinal contours and pulmonary vascularity normal. Eventration of RIGHT diaphragm again noted. Minimal LEFT basilar atelectasis. Remaining lungs clear. No pleural effusion or pneumothorax. Bones demineralized. IMPRESSION: Post CABG. Chronic eventration anterior RIGHT diaphragm. Minimal LEFT basilar atelectasis. Electronically Signed   By: Lavonia Dana M.D.   On: 12/05/2015 14:13   I have personally reviewed and evaluated these images and lab results as part of my medical decision-making.   EKG Interpretation   Date/Time:  Sunday Dec 05 2015 12:27:11 EDT Ventricular Rate:  105 PR Interval:  158 QRS Duration: 78  QT Interval:  348 QTC Calculation: 459 R Axis:   4 Text Interpretation:  Sinus tachycardia Inferior infarct , age  undetermined Anterior infarct , age undetermined Abnormal ECG ST/T waves  with nonspecific changes Confirmed by Acy Orsak MD, Natnael Biederman [phone]) on  12/05/2015 1:19:27 PM      MDM   Final diagnoses:  Chest pain, unspecified chest pain type    Patient's pain is improved with nitroglycerin. Patient is mildly tachycardic but despite this I think the most likely cause is from his heart. He states this is similar to prior angina. I doubt pulmonary embolism or dissection. Initial troponin negative.  Cardiology has been consulted. They will see but likely admit.     Sherwood Gambler, MD 12/05/15 [phone]

## 2015-12-05 NOTE — ED Notes (Signed)
MD at bedside. 

## 2015-12-05 NOTE — ED Notes (Signed)
Admitting provider made aware of D-Dimer 1.69

## 2015-12-05 NOTE — ED Notes (Signed)
Attempted to call report

## 2015-12-05 NOTE — H&P (Signed)
History & Physical    Patient ID: Benjamin Harrison MRN: [id number], DOB/AGE: Dec 29, 1934   Admit date: 12/05/2015   Primary Physician: Leonard Downing, MD Primary Cardiologist: Joaquim Nam, MD   Patient Profile    81 y/o ? with a h/o CAD s/p CABG, HTN, HL, DM II, who presented to the ED today with a 12 hr h/o nausea and indigestion followed by sscp/back pain, and dyspnea.  Past Medical History    Past Medical History  Diagnosis Date  . Coronary artery disease     a. CABG 2001 with early failure of VG-RCA. b. Inf MI after Lexiscan 01/28/2009 s/p RCA stent. c. Stent to dRCA 02/24/09. d. DES to [place]  2012. e. NSTEMI s/p angioplasty for ISR of RCA 09/2011. f. 05/2012 Cath: LM 20-30p, LAD 100p, LCX min irregs, RCA patent prox stent, 29m ISR, VG->Diag nl w/ dzs in D1 prox to graft, VG->RCA 100, VG->OM nl, LIMA->LAD nl, Nl EF->Med Rx; g. 09/2014 low-risk MV.  Marland Kitchen Hypertensive heart disease   . Hypothyroidism   . Hyperlipidemia   . Personal history of tobacco use   . Type II diabetes mellitus ([name])   . Osteoarthritis     a.End-stage osteoarthritis, right knee, medial compartment  . History of echocardiogram     a. 09/2014 Echo: EF 55-60%, mild basal-midinferior HK, triv AI.    Past Surgical History  Procedure Laterality Date  . Coronary artery bypass graft    . Cardiac catheterization    . Knee arthroscopy  1999 and 2004  . Hernia repair  1985  . Left heart catheterization with coronary angiogram N/A 10/09/2011    Procedure: LEFT HEART CATHETERIZATION WITH CORONARY ANGIOGRAM;  Surgeon: Peter M Martinique, MD;  Location: [facility] CATH LAB;  Service: Cardiovascular;  Laterality: N/A;  . Left heart catheterization with coronary/graft angiogram N/A 06/06/2012    Procedure: LEFT HEART CATHETERIZATION WITH Beatrix Fetters;  Surgeon: Peter M Martinique, MD;  Location: [facility] CATH LAB;  Service: Cardiovascular;  Laterality: N/A;     Allergies  Allergies  Allergen Reactions  . Codeine    Makes pt Sick    History of Present Illness    81 y/o ? with the above complex PMH including CAD s/p CABG in [id number], complicated by early graft failure of the VG  RCA.  In that setting, he has undergone multiple interventions within the native RCA between 2010 and 2013.  Last cath in 05/2012 revealed patent prox RCA stent w/ 50% ISR in the mid-RCA stents, along with patent LIMA  LAD, VG  Diag, and VG  OM1.  The native D1 had severe proximal dzs and filled retrograde from the graft.  It was felt that that area may be responsible for his angina @ the time and he has since been managed successfully with imdur.  His last stress test was in 09/2014 and was low-risk.  He was last seen in clinic by Dr. Acie Fredrickson in January @ which time he reported only occasional chest pain requiring SL ntg.  He says that he uses a SL ntg < once/month.  He was in his usual state of health until late on the evening of 5/6, when he began to experience mild nausea.  This progressed and by 2:30 AM, he noted significant indigestion, belching, and vomiting.  These Ss persisted and @ approximately 5 AM, he began to experience substernal chest heaviness radiating through to his back and associated with dyspnea.  He identified these Ss as similar  to prior angina but did not take a ntg @ home.  Ss persisted for an additional 7 hrs prior to presenting to the ED @ ~ noon today.  He did not take his morning meds today.  Upon arrival, he was tachycardic (sinus w/ pac's).  ECG is non-acute.  Initial troponin was nl.  He was treated with sl ntg x3 and zofran with improvement in both sscp and nausea, though nausea still persists at a lower level.  He continues to c/o general malaise and remains tachycardic.  He is about to receive an IVF bolus.  Home Medications    Prior to Admission medications   Medication Sig Start Date End Date Taking? Authorizing Provider  aspirin 81 MG tablet Take 1 tablet (81 mg total) by mouth daily. 10/10/11  Yes Rogelia Mire, NP  atorvastatin (LIPITOR) 40 MG tablet Take 1 tablet by mouth  daily 06/01/15  Yes Thayer Headings, MD  clopidogrel (PLAVIX) 75 MG tablet Take 1 tablet by mouth  daily 06/01/15  Yes Thayer Headings, MD  Esomeprazole Magnesium (NEXIUM PO) Take 22.3 mg by mouth daily.   Yes Historical Provider, MD  glimepiride (AMARYL) 4 MG tablet Take 4 mg by mouth daily with breakfast.   Yes Historical Provider, MD  hydrochlorothiazide (HYDRODIURIL) 25 MG tablet Take 1 tablet (25 mg total) by mouth daily. 12/19/13  Yes Thayer Headings, MD  isosorbide mononitrate (IMDUR) 60 MG 24 hr tablet Take 1 tablet by mouth  daily 10/05/15  Yes Thayer Headings, MD  levothyroxine (SYNTHROID, LEVOTHROID) 100 MCG tablet Take 100 mcg by mouth daily.   Yes Historical Provider, MD  metFORMIN (GLUCOPHAGE) 1000 MG tablet Take by mouth daily with breakfast.  03/13/14  Yes Historical Provider, MD  metoprolol tartrate (LOPRESSOR) 25 MG tablet Take 1 tablet by mouth two  times daily 06/01/15  Yes Thayer Headings, MD  nitroGLYCERIN (NITROSTAT) 0.4 MG SL tablet Place 1 tablet (0.4 mg total) under the tongue every 5 (five) minutes as needed. For chest pain. 08/30/15  Yes Thayer Headings, MD  valsartan (DIOVAN) 80 MG tablet Take 1 tablet by mouth  daily 10/05/15  Yes Thayer Headings, MD    Family History    Family History  Problem Relation Age of Onset  . Hypertension Mother   . Cancer Sister     Throat Cancer  . Heart disease Brother   . Pneumonia Brother   . Heart disease Brother   . Cancer Brother     Prostate Cancer    Social History    Social History   Social History  . Marital Status: Married    Spouse Name: N/A  . Number of Children: N/A  . Years of Education: N/A   Occupational History  . Not on file.   Social History Main Topics  . Smoking status: Former Smoker    Quit date: 08/01/1975  . Smokeless tobacco: Not on file  . Alcohol Use: No  . Drug Use: No  . Sexual Activity: No   Other Topics Concern  .  Not on file   Social History Narrative   Lives locally with wife.     Review of Systems    General:  No chills, fever, night sweats or weight changes.  Cardiovascular:  +++ chest pain, +++ dyspnea, no edema, orthopnea, palpitations, paroxysmal nocturnal dyspnea. Dermatological: No rash, lesions/masses Respiratory: No cough, +++ dyspnea Urologic: No hematuria, dysuria Abdominal:   +++ nausea, vomiting, indigestion.  No diarrhea, bright red blood per rectum, melena, or hematemesis Neurologic:  No visual changes, +++ wkns, no changes in mental status. All other systems reviewed and are otherwise negative except as noted above.  Physical Exam    Blood pressure 110/57, pulse 122, temperature 97.5 F (36.4 C), temperature source Oral, resp. rate 23, height 5\' 5"  (1.651 m), SpO2 93 %.  General: Pleasant, NAD Psych: Flat affect. Neuro: Alert and oriented X 3. Moves all extremities spontaneously. HEENT: Normal  Neck: Supple without bruits or JVD. Lungs:  Resp regular and unlabored, CTA. Heart: RRR, tachy, no s3, s4, or murmurs. Abdomen: Soft, non-tender, non-distended, BS + x 4.  Extremities: No clubbing, cyanosis or edema. DP/PT/Radials 2+ and equal bilaterally.  Labs    Troponin [facility] of Care Test)  Recent Labs  12/05/15 1253  TROPIPOC 0.00    Lab Results  Component Value Date   WBC 5.3 12/05/2015   HGB 14.9 12/05/2015   HCT 45.4 12/05/2015   MCV 75.3* 12/05/2015   PLT 189 12/05/2015     Recent Labs Lab 12/05/15 1300 12/05/15 1409  NA 138  --   K 4.1  --   CL 104  --   CO2 22  --   BUN 28*  --   CREATININE 1.07  --   CALCIUM 9.1  --   PROT  --  7.1  BILITOT  --  1.9*  ALKPHOS  --  58  ALT  --  31  AST  --  24  GLUCOSE 183*  --    Lab Results  Component Value Date   CHOL 79* 08/30/2015   HDL 25* 08/30/2015   LDLCALC 44 08/30/2015   TRIG 49 08/30/2015    Radiology Studies    Dg Chest 2 View  12/05/2015  CLINICAL DATA:  Onset of vomiting at 0200  hours today, epigastric pain, dizziness, chest heaviness, shortness of breath, history hypertension, coronary artery disease post MI coronary stenting and CABG, former smoker EXAM: CHEST  2 VIEW COMPARISON:  11/03/2013 FINDINGS: Upper normal heart size post CABG. Atherosclerotic calcification aorta. Mediastinal contours and pulmonary vascularity normal. Eventration of RIGHT diaphragm again noted. Minimal LEFT basilar atelectasis. Remaining lungs clear. No pleural effusion or pneumothorax. Bones demineralized. IMPRESSION: Post CABG. Chronic eventration anterior RIGHT diaphragm. Minimal LEFT basilar atelectasis. Electronically Signed   By: Lavonia Dana M.D.   On: 12/05/2015 14:13    ECG & Cardiac Imaging    Sinus Tachycardia, 105, inf/ant infarcts  Assessment & Plan    1.  Midsternal Chest Pain/CAD:  Pt presented to the ED this afternoon following development of nausea, vomiting, indigestion, and later sscp radiating through to his upper back, and associated with dyspnea.  Ss improved with sl NTG and zofran, though he remains mildly nauseated and c/o 2/10 chest pain.  ECG is non-acute, though he is tachycardic (missed meds this AM; vomited).  Despite prolonged Ss, initial troponin nl.  LFT's wnl.  No evidence of widened mediastinum on CXR.  He notes that Ss are similar to prior angina.  Plan to admit and cycle CE.  Check lipase, gb u/s.  Add nitrate.  Withhold heparin in setting of multiple vomiting episodes, however will add if he rules in.  Not clear that Ss are cardiac in origin given indigestion, n/v.  Cont PPI and add prn mylanta.  Cont asa, plavix, statin,  blocker.  If he r/i would plan cath.  If CE neg, consider myoview.   2.  Hypertensive  heart Disease:  Hold home diuretic in setting of presumed dehydration with elev BUN and tachycardia.  Cont  blocker, ARB.  3.  HL:  LDL 44 in 08/2015.  Cont statin.  4.  Type II DM:  On metformin and amaryl @ home.  Add SSI.  5.  Hypothyroidism:  Cont  synthroid.  Signed, Murray Hodgkins, NP 12/05/2015, 3:53 PM    Cardiology attending  Patient seen and examined. I have reviewed the findings as documented above and agree. The patient is a 81 year old man who was admitted to the hospital with initial development of nausea and vomiting and then substernal chest pain and back pain. The patient states that his chest pain is similar to that which she experienced during previous acute coronary syndromes. He does note that these prior episodes were not associated with nausea or vomiting. Until 12 hours prior to admission, the patient was otherwise well. His initial examination was unremarkable except for a resting sinus tachycardia. Laboratory evaluation was also unremarkable except for an elevated BUN, normal troponins, and borderline elevated bilirubin. He denies fevers or epigastric pain. His ECG demonstrates normal sinus rhythm and sinus tachycardia with prior inferior MI pattern. Assessment and plan 1. Chest pain associated with nausea and vomiting - the etiology of his symptoms is unclear. He will be ruled out for MI. I doubt coronary ischemia. I suspect the patient had some type of esophageal pain associated with vomiting. He still has mild chest pain. We'll plan to hold out on IV heparin. He appears to be dry so we will give him some intravenous fluids. To evaluate the chest pain, we will obtain an ultrasound of his gallbladder. If cardiac markers remain negative, I would not be inclined to recommend stress testing. We'll continue the treatment the pavement with antinausea medications and give him Carafate. 2. Coronary artery disease - the patient has known disease. He will undergo watchful waiting. We will continue his regular cardiac medications.  Cristopher Peru, M.D.

## 2015-12-05 NOTE — ED Notes (Signed)
Pt's daughter reports pt has n/v intermittently for past 2 months.

## 2015-12-05 NOTE — ED Notes (Signed)
Cardiologist PA at bedside 

## 2015-12-05 NOTE — ED Provider Notes (Signed)
81 y.o. Male received care from Dr. Eula Listen. Cad here with chest pain awoke at 0500, ekg nonspecficic, cardiology is evaluating.  Cardiology to admit.   Pattricia Boss, MD 12/08/15 [phone]

## 2015-12-06 ENCOUNTER — Ambulatory Visit (HOSPITAL_COMMUNITY): Admit: 2015-12-06 | Payer: Self-pay | Admitting: Cardiovascular Disease

## 2015-12-06 ENCOUNTER — Encounter (HOSPITAL_COMMUNITY)
Admission: EM | Disposition: A | Discharge status: Home / Self Care | Payer: Self-pay | Source: Home / Self Care | Attending: Internal Medicine

## 2015-12-06 ENCOUNTER — Observation Stay (HOSPITAL_COMMUNITY): Payer: Medicare Other

## 2015-12-06 DIAGNOSIS — R079 Chest pain, unspecified: Secondary | ICD-10-CM | POA: Diagnosis present

## 2015-12-06 DIAGNOSIS — E1159 Type 2 diabetes mellitus with other circulatory complications: Secondary | ICD-10-CM | POA: Diagnosis not present

## 2015-12-06 DIAGNOSIS — T82855A Stenosis of coronary artery stent, initial encounter: Secondary | ICD-10-CM | POA: Diagnosis present

## 2015-12-06 DIAGNOSIS — Z79899 Other long term (current) drug therapy: Secondary | ICD-10-CM | POA: Diagnosis not present

## 2015-12-06 DIAGNOSIS — I25110 Atherosclerotic heart disease of native coronary artery with unstable angina pectoris: Secondary | ICD-10-CM

## 2015-12-06 DIAGNOSIS — E039 Hypothyroidism, unspecified: Secondary | ICD-10-CM | POA: Diagnosis present

## 2015-12-06 DIAGNOSIS — Z7984 Long term (current) use of oral hypoglycemic drugs: Secondary | ICD-10-CM | POA: Diagnosis not present

## 2015-12-06 DIAGNOSIS — I252 Old myocardial infarction: Secondary | ICD-10-CM | POA: Diagnosis not present

## 2015-12-06 DIAGNOSIS — Z7982 Long term (current) use of aspirin: Secondary | ICD-10-CM | POA: Diagnosis not present

## 2015-12-06 DIAGNOSIS — Y832 Surgical operation with anastomosis, bypass or graft as the cause of abnormal reaction of the patient, or of later complication, without mention of misadventure at the time of the procedure: Secondary | ICD-10-CM | POA: Diagnosis present

## 2015-12-06 DIAGNOSIS — I119 Hypertensive heart disease without heart failure: Secondary | ICD-10-CM | POA: Diagnosis present

## 2015-12-06 DIAGNOSIS — Z951 Presence of aortocoronary bypass graft: Secondary | ICD-10-CM | POA: Diagnosis not present

## 2015-12-06 DIAGNOSIS — I200 Unstable angina: Secondary | ICD-10-CM | POA: Diagnosis not present

## 2015-12-06 DIAGNOSIS — Z7902 Long term (current) use of antithrombotics/antiplatelets: Secondary | ICD-10-CM | POA: Diagnosis not present

## 2015-12-06 DIAGNOSIS — Z87891 Personal history of nicotine dependence: Secondary | ICD-10-CM | POA: Diagnosis not present

## 2015-12-06 DIAGNOSIS — E785 Hyperlipidemia, unspecified: Secondary | ICD-10-CM

## 2015-12-06 DIAGNOSIS — E860 Dehydration: Secondary | ICD-10-CM | POA: Diagnosis present

## 2015-12-06 DIAGNOSIS — E119 Type 2 diabetes mellitus without complications: Secondary | ICD-10-CM | POA: Diagnosis present

## 2015-12-06 DIAGNOSIS — M1990 Unspecified osteoarthritis, unspecified site: Secondary | ICD-10-CM | POA: Diagnosis present

## 2015-12-06 HISTORY — PX: CARDIAC CATHETERIZATION: SHX172

## 2015-12-06 LAB — HEMOGLOBIN A1C
Hgb A1c MFr Bld: 6.5 % — ABNORMAL HIGH (ref 4.8–5.6)
Mean Plasma Glucose: 140 mg/dL

## 2015-12-06 LAB — BASIC METABOLIC PANEL
ANION GAP: 10 (ref 5–15)
BUN: 27 mg/dL — ABNORMAL HIGH (ref 6–20)
CHLORIDE: 102 mmol/L (ref 101–111)
CO2: 25 mmol/L (ref 22–32)
Calcium: 8.2 mg/dL — ABNORMAL LOW (ref 8.9–10.3)
Creatinine, Ser: 1.35 mg/dL — ABNORMAL HIGH (ref 0.61–1.24)
GFR calc Af Amer: 56 mL/min — ABNORMAL LOW (ref >60)
GFR calc non Af Amer: 49 mL/min — ABNORMAL LOW (ref >60)
GLUCOSE: 121 mg/dL — AB (ref 65–99)
Potassium: 3.5 mmol/L (ref 3.5–5.1)
Sodium: 137 mmol/L (ref 135–145)

## 2015-12-06 LAB — GLUCOSE, CAPILLARY
GLUCOSE-CAPILLARY: 109 mg/dL — AB (ref 65–99)
GLUCOSE-CAPILLARY: 106 mg/dL — AB (ref 65–99)
Glucose-Capillary: 60 mg/dL — ABNORMAL LOW (ref 65–99)
Glucose-Capillary: 74 mg/dL (ref 65–99)
Glucose-Capillary: 99 mg/dL (ref 65–99)

## 2015-12-06 LAB — PROTIME-INR
INR: 1.21 (ref 0.00–1.49)
Prothrombin Time: 15.5 seconds — ABNORMAL HIGH (ref 11.6–15.2)

## 2015-12-06 LAB — POCT ACTIVATED CLOTTING TIME: ACTIVATED CLOTTING TIME: 286 s

## 2015-12-06 LAB — TROPONIN I
Troponin I: 0.03 ng/mL (ref <0.031)
Troponin I: <0.03 ng/mL (ref <0.031)

## 2015-12-06 SURGERY — LEFT HEART CATH AND CORS/GRAFTS ANGIOGRAPHY

## 2015-12-06 MED ORDER — FENTANYL CITRATE (PF) 100 MCG/2ML IJ SOLN
INTRAMUSCULAR | Status: DC | PRN
  Administered 2015-12-06: 25 ug via INTRAVENOUS

## 2015-12-06 MED ORDER — HEPARIN (PORCINE) IN NACL 2-0.9 UNIT/ML-% IJ SOLN
INTRAMUSCULAR | Status: DC | PRN
  Administered 2015-12-06: 17:00:00

## 2015-12-06 MED ORDER — SODIUM CHLORIDE 0.9 % IV SOLN: 250.0000 mL | INTRAVENOUS | Status: DC | PRN

## 2015-12-06 MED ORDER — MIDAZOLAM HCL 2 MG/2ML IJ SOLN
INTRAMUSCULAR | Status: DC | PRN
  Administered 2015-12-06: 1 mg via INTRAVENOUS

## 2015-12-06 MED ORDER — IOPAMIDOL (ISOVUE-370) INJECTION 76%
INTRAVENOUS | Status: AC
  Filled 2015-12-06: qty 100

## 2015-12-06 MED ORDER — HEPARIN SODIUM (PORCINE) 1000 UNIT/ML IJ SOLN
INTRAMUSCULAR | Status: AC
  Filled 2015-12-06: qty 1

## 2015-12-06 MED ORDER — ACTIVE PARTNERSHIP FOR HEALTH OF YOUR HEART BOOK
Freq: Once | Status: AC
  Administered 2015-12-06: 21:00:00
  Filled 2015-12-06: qty 1

## 2015-12-06 MED ORDER — IOPAMIDOL (ISOVUE-370) INJECTION 76%
INTRAVENOUS | Status: AC
  Filled 2015-12-06: qty 125

## 2015-12-06 MED ORDER — SODIUM CHLORIDE 0.9 % WEIGHT BASED INFUSION
3.0000 mL/kg/h | INTRAVENOUS | Status: DC
  Administered 2015-12-06: 3 mL/kg/h via INTRAVENOUS

## 2015-12-06 MED ORDER — IOPAMIDOL (ISOVUE-370) INJECTION 76%
INTRAVENOUS | Status: DC | PRN
  Administered 2015-12-06: 155 mL via INTRA_ARTERIAL

## 2015-12-06 MED ORDER — SODIUM CHLORIDE 0.9% FLUSH: 3.0000 mL | INTRAVENOUS | Status: DC | PRN

## 2015-12-06 MED ORDER — SODIUM CHLORIDE 0.9 % WEIGHT BASED INFUSION: 1.0000 mL/kg/h | INTRAVENOUS | Status: DC

## 2015-12-06 MED ORDER — HEPARIN SODIUM (PORCINE) 1000 UNIT/ML IJ SOLN
INTRAMUSCULAR | Status: DC | PRN
  Administered 2015-12-06: 6000 [IU] via INTRAVENOUS
  Administered 2015-12-06: 4000 [IU] via INTRAVENOUS

## 2015-12-06 MED ORDER — MIDAZOLAM HCL 2 MG/2ML IJ SOLN
INTRAMUSCULAR | Status: AC
  Filled 2015-12-06: qty 2

## 2015-12-06 MED ORDER — SODIUM CHLORIDE 0.9% FLUSH
3.0000 mL | Freq: Two times a day (BID) | INTRAVENOUS | Status: DC
  Administered 2015-12-07: 3 mL via INTRAVENOUS

## 2015-12-06 MED ORDER — VERAPAMIL HCL 2.5 MG/ML IV SOLN
INTRAVENOUS | Status: AC
  Filled 2015-12-06: qty 2

## 2015-12-06 MED ORDER — SODIUM CHLORIDE 0.9% FLUSH: 3.0000 mL | Freq: Two times a day (BID) | INTRAVENOUS | Status: DC

## 2015-12-06 MED ORDER — ASPIRIN 81 MG PO CHEW: 81.0000 mg | CHEWABLE_TABLET | ORAL | Status: DC

## 2015-12-06 MED ORDER — VERAPAMIL HCL 2.5 MG/ML IV SOLN
INTRAVENOUS | Status: DC | PRN
  Administered 2015-12-06: 16:00:00 via INTRA_ARTERIAL

## 2015-12-06 MED ORDER — ANGIOPLASTY BOOK
Freq: Once | Status: AC
  Administered 2015-12-06: 21:00:00
  Filled 2015-12-06: qty 1

## 2015-12-06 MED ORDER — LIDOCAINE HCL (PF) 1 % IJ SOLN
INTRAMUSCULAR | Status: DC | PRN
  Administered 2015-12-06: 2 mL

## 2015-12-06 MED ORDER — SODIUM CHLORIDE 0.9 % IV SOLN
INTRAVENOUS | Status: AC
  Administered 2015-12-06: 18:00:00 via INTRAVENOUS

## 2015-12-06 MED ORDER — LIDOCAINE HCL (PF) 1 % IJ SOLN
INTRAMUSCULAR | Status: AC
  Filled 2015-12-06: qty 30

## 2015-12-06 MED ORDER — FENTANYL CITRATE (PF) 100 MCG/2ML IJ SOLN
INTRAMUSCULAR | Status: AC
  Filled 2015-12-06: qty 2

## 2015-12-06 MED ORDER — NITROGLYCERIN 1 MG/10 ML FOR IR/CATH LAB
INTRA_ARTERIAL | Status: AC
  Filled 2015-12-06: qty 10

## 2015-12-06 MED ORDER — HEPARIN (PORCINE) IN NACL 2-0.9 UNIT/ML-% IJ SOLN
INTRAMUSCULAR | Status: AC
  Filled 2015-12-06: qty 1500

## 2015-12-06 SURGICAL SUPPLY — 17 items
BALLN ANGIOSCULPT RX 2.5X15 (BALLOONS) ×3
BALLOON ANGIOSCULPT RX 2.5X15 (BALLOONS) ×1 IMPLANT
CATH INFINITI 5 FR LCB (CATHETERS) ×3 IMPLANT
CATH INFINITI 5FR AL1 (CATHETERS) ×3 IMPLANT
CATH INFINITI 5FR MULTPACK ANG (CATHETERS) ×3 IMPLANT
CATH VISTA GUIDE 6FR JR4 SH (CATHETERS) ×3 IMPLANT
DEVICE RAD COMP TR BAND LRG (VASCULAR PRODUCTS) ×3 IMPLANT
GLIDESHEATH SLEND SS 6F .021 (SHEATH) ×3 IMPLANT
KIT ENCORE 26 ADVANTAGE (KITS) ×3 IMPLANT
KIT HEART LEFT (KITS) ×3 IMPLANT
PACK CARDIAC CATHETERIZATION (CUSTOM PROCEDURE TRAY) ×3 IMPLANT
SYR MEDRAD MARK V 150ML (SYRINGE) IMPLANT
TRANSDUCER W/STOPCOCK (MISCELLANEOUS) ×3 IMPLANT
TUBING CIL FLEX 10 FLL-RA (TUBING) ×3 IMPLANT
WIRE COUGAR XT STRL 190CM (WIRE) ×3 IMPLANT
WIRE HI TORQ VERSACORE-J 145CM (WIRE) ×3 IMPLANT
WIRE SAFE-T 1.5MM-J .035X260CM (WIRE) ×3 IMPLANT

## 2015-12-06 NOTE — Progress Notes (Signed)
Patient Name: Benjamin Harrison Date of Encounter: 12/06/2015     Active Problems:   Unstable angina ([place])    SUBJECTIVE  Feeling much better today. No more CP or vomiting    CURRENT MEDS . atorvastatin  40 mg Oral q1800  . clopidogrel  75 mg Oral Daily  . glimepiride  4 mg Oral Q breakfast  . heparin  5,000 Units Subcutaneous Q8H  . insulin aspart  0-15 Units Subcutaneous TID WC  . irbesartan  75 mg Oral Daily  . levothyroxine  100 mcg Oral Daily  . metoprolol tartrate  25 mg Oral BID  . nitroGLYCERIN  1 inch Topical Q6H  . pantoprazole  40 mg Oral Daily  . sucralfate  1 g Oral Q6H    OBJECTIVE  Filed Vitals:   12/05/15 1843 12/05/15 2120 12/05/15 2300 12/06/15 0515  BP: 135/88 121/81 92/56 95/58   Pulse: 108 100 85 84  Temp: 98.2 F (36.8 C) 98.2 F (36.8 C)  98.1 F (36.7 C)  TempSrc: Oral Oral  Oral  Resp:  16  16  Height: 5\' 6"  (1.676 m)     Weight: 172 lb 8 oz (78.245 kg)   169 lb 4.8 oz (76.794 kg)  SpO2: 97% 95%  96%    Intake/Output Summary (Last 24 hours) at 12/06/15 0841 Last data filed at 12/05/15 2300  Gross per 24 hour  Intake    500 ml  Output    225 ml  Net    275 ml   Filed Weights   12/05/15 1843 12/06/15 0515  Weight: 172 lb 8 oz (78.245 kg) 169 lb 4.8 oz (76.794 kg)    PHYSICAL EXAM  General: Pleasant, NAD. Neuro: Alert and oriented X 3. Moves all extremities spontaneously. Psych: Normal affect. HEENT:  Normal  Neck: Supple without bruits or JVD. Lungs:  Resp regular and unlabored, CTA. Heart: RRR no s3, s4, or murmurs. Abdomen: Soft, non-tender, non-distended, BS + x 4.  Extremities: No clubbing, cyanosis or edema. DP/PT/Radials 2+ and equal bilaterally.  Accessory Clinical Findings  CBC  Recent Labs  12/05/15 1300 12/05/15 1901  WBC 5.3 4.8  HGB 14.9 14.8  HCT 45.4 45.4  MCV 75.3* 77.2*  PLT 189 [id number]   Basic Metabolic Panel  Recent Labs  12/05/15 1300 12/05/15 1901 12/06/15 0538  NA 138  --  137  K 4.1   --  3.5  CL 104  --  102  CO2 22  --  25  GLUCOSE 183*  --  121*  BUN 28*  --  27*  CREATININE 1.07 1.15 1.35*  CALCIUM 9.1  --  8.2*   Liver Function Tests  Recent Labs  12/05/15 1409  AST 24  ALT 31  ALKPHOS 58  BILITOT 1.9*  PROT 7.1  ALBUMIN 3.5    Recent Labs  12/05/15 1409  LIPASE 32   Cardiac Enzymes  Recent Labs  12/05/15 1901 12/06/15 0034 12/06/15 0538  TROPONINI <0.03 0.03 <0.03   BNP Invalid input(s): POCBNP D-Dimer  Recent Labs  12/05/15 1551  DDIMER 1.69*   TELE  NSR with PACs   Radiology/Studies  Dg Chest 2 View  12/05/2015  CLINICAL DATA:  Onset of vomiting at 0200 hours today, epigastric pain, dizziness, chest heaviness, shortness of breath, history hypertension, coronary artery disease post MI coronary stenting and CABG, former smoker EXAM: CHEST  2 VIEW COMPARISON:  11/03/2013 FINDINGS: Upper normal heart size post CABG. Atherosclerotic calcification aorta. Mediastinal contours  and pulmonary vascularity normal. Eventration of RIGHT diaphragm again noted. Minimal LEFT basilar atelectasis. Remaining lungs clear. No pleural effusion or pneumothorax. Bones demineralized. IMPRESSION: Post CABG. Chronic eventration anterior RIGHT diaphragm. Minimal LEFT basilar atelectasis. Electronically Signed   By: Lavonia Dana M.D.   On: 12/05/2015 14:13   Ct Angio Chest Pe W/cm &/or Wo Cm  12/05/2015  CLINICAL DATA:  81 year old male with acute chest pain and shortness of breath for 1 day. Elevated D-dimer. EXAM: CT ANGIOGRAPHY CHEST WITH CONTRAST TECHNIQUE: Multidetector CT imaging of the chest was performed using the standard protocol during bolus administration of intravenous contrast. Multiplanar CT image reconstructions and MIPs were obtained to evaluate the vascular anatomy. CONTRAST:  80 cc intravenous Isovue 300 COMPARISON:  12/05/2015 and prior chest radiographs. 07/04/2011 chest CT FINDINGS: This is a technically adequate study. Mediastinum/Nodes: No  pulmonary emboli are identified. Cardiomegaly and CABG changes noted. There is no evidence of pericardial effusion or enlarged lymph nodes. No thoracic aortic aneurysm identified. Lungs/Pleura: Mild left lower lung scarring again noted. There is no evidence of airspace disease, consolidation, mass, nodule, pleural effusion or pneumothorax. Upper abdomen: No acute abnormality Musculoskeletal: No acute or suspicious focal abnormalities. Review of the MIP images confirms the above findings. IMPRESSION: No evidence of acute abnormality. No evidence of pulmonary emboli or thoracic aortic aneurysm. Cardiomegaly, CABG changes and mild left lower lung scarring. Electronically Signed   By: Margarette Canada M.D.   On: 12/05/2015 20:06   US Abdomen Complete  12/06/2015  CLINICAL DATA:  Indigestion, chest pain EXAM: ABDOMEN ULTRASOUND COMPLETE COMPARISON:  03/07/2013 FINDINGS: Gallbladder: No gallstones or wall thickening visualized. No sonographic Murphy sign noted by sonographer. Common bile duct: Diameter: 3.7 mm in diameter within normal limits. Liver: No focal hepatic mass. There is heterogeneous increased echogenicity of the liver suspicious for fatty infiltration. IVC:  There is  limited visualization due to abundant bowel gas Pancreas: Limited visualization due to abundant bowel gas. Spleen: Spleen measures 8.5 cm in length.  No focal mass. Right Kidney: Length: 10.7 cm in length. Normal echogenicity. No hydronephrosis. There is a midpole cyst measures 1.5 x 1.6 cm. Left Kidney: Length: 11.2 cm in length. Echogenicity within normal limits. No mass or hydronephrosis visualized. Abdominal aorta: There is aneurysmal dilatation of abdominal aorta measures 3.6 x 3.5 cm. On the prior exam measures 3.4 cm. Recommend followup by ultrasound in 2 years. This recommendation follows ACR consensus guidelines: White Paper of the ACR Incidental Findings Committee II on Vascular Findings. J Am Coll Radiol 2013; 10:789-794. Other findings:  None. IMPRESSION: 1. No gallstones are noted within gallbladder.  Normal CBD. 2. Diffuse increased echogenicity of the liver suspicious for fatty infiltration. 3. There is aneurysmal dilatation of abdominal aorta measures 3.6 x 3.5 cm. On the prior exam measures 3.4 cm. Recommend followup by ultrasound in 2 years. This recommendation follows ACR consensus guidelines: White Paper of the ACR Incidental Findings Committee II on Vascular Findings. J Am Coll Radiol 2013; 10:789-794. 4. No hydronephrosis.  Right renal cyst measures 1.6 cm. Electronically Signed   By: Lahoma Crocker M.D.   On: 12/06/2015 08:15    ASSESSMENT AND PLAN 81 y/o ? with a h/o CAD s/p CABG (2001) s/p DES to pRCA (2010), s/p multiple PCIs to RCA in the past, HTN, HLD, DM II, who presented to the ED 12/05/15 with a 12 hr h/o nausea and indigestion followed by sscp/back pain, and dyspnea similar to his sx prior to his MI.  Chest pain: he has ruled out for MI, but chest pain similar to previous sx during heart attack. Now chest pain and nausea have resolved. Bilirubin mildly elevated. Lipase normal. Abdominal US with no acute findings. CTA with no PE (elevated D dimer). Discussed with Dr. Sallyanne Kuster who feels we should proceed with coronary angiography.   I have reviewed the risks, indications, and alternatives to cardiac catheterization and possible angioplasty/stenting with the patient. Risks include but are not limited to bleeding, infection, vascular injury, stroke, myocardial infection, arrhythmia, kidney injury, radiation-related injury in the case of prolonged fluoroscopy use, emergency cardiac surgery, and death. The patient understands the risks of serious complication is low ([id number]).   CAD: extensive hx of CAD: CABG 2001 with early failure of VG-RCA. b. Inf MI after Lexiscan 01/28/2009 s/p RCA stent. c. Stent to dRCA 02/24/09. d. DES to [place] 2012. e. NSTEMI s/p angioplasty for ISR of RCA 09/2011. f. 05/2012 Cath: LM 20-30p, LAD 100p,  LCX min irregs, RCA patent prox stent, 33m ISR, VG->Diag nl w/ dzs in D1 prox to graft, VG->RCA 100, VG->OM nl, LIMA->LAD nl, Nl EF->Med Rx; g. 09/2014 low-risk MV. -- Continue ASA/plavix, statin and BB.  Hypertensive heart Disease:BP is soft. Home diuretic held in setting of presumed dehydration with elev BUN and tachycardia.Cont ? blocker, ARB.  HLD: LDL 44 in 08/2015. Cont statin.  Type II DM: On metformin and amaryl @ home. Add SSI.  Hypothyroidism: Cont synthroid.    Mable Fill R PA-C  Pager [phone]  I have seen and examined the patient along with Angelena Form R PA-C.  I have reviewed the chart, notes and new data.  I agree with PA's note.  Key new complaints: feeling better, vomiting and pain have subsided. His interscapular pain and the intractable vomiting are identical to his presentation with his last MI Key examination changes: no CHF, no arrhythmia Key new findings / data: normal cardiac enzymes and no acute ECG ischemic chamges  PLAN: I recommend proceeding directly to coronary angiography since his symptoms are so typical and he has a history of disease progression and restenosis after his revascularization procedures. This procedure has been fully reviewed with the patient and written informed consent has been obtained.   Sanda Klein, MD, [place] [phone] 12/06/2015, 9:19 AM

## 2015-12-06 NOTE — Care Management Obs Status (Signed)
MEDICARE OBSERVATION STATUS NOTIFICATION   Patient Details  Name: ANDREU PALIN MRN: [id number] Date of Birth: 1935/07/27   Medicare Observation Status Notification Given:  Yes (unstable angina. )    Bethena Roys, RN 12/06/2015, 12:04 PM

## 2015-12-06 NOTE — Interval H&P Note (Signed)
History and Physical Interval Note:  12/06/2015 4:21 PM  Benjamin Harrison  has presented today for cardiac cath  with the diagnosis of unstable angina. The various methods of treatment have been discussed with the patient and family. After consideration of risks, benefits and other options for treatment, the patient has consented to  Procedure(s): Left Heart Cath and Cors/Grafts Angiography (N/A) as a surgical intervention .  The patient's history has been reviewed, patient examined, no change in status, stable for surgery.  I have reviewed the patient's chart and labs.  Questions were answered to the patient's satisfaction.     MCALHANY,CHRISTOPHER

## 2015-12-06 NOTE — Progress Notes (Signed)
TR BAND REMOVAL  LOCATION:    left radial  DEFLATED PER PROTOCOL:    Yes.    TIME BAND OFF / DRESSING APPLIED:    21:30   SITE UPON ARRIVAL:    Level 0  SITE AFTER BAND REMOVAL:    Level 0  CIRCULATION SENSATION AND MOVEMENT:    Within Normal Limits   Yes.    COMMENTS:   Post TR band instructions given. Pt tolerated well. 

## 2015-12-06 NOTE — H&P (View-Only) (Signed)
Patient Name: Benjamin Harrison Date of Encounter: 12/06/2015     Active Problems:   Unstable angina ([place])    SUBJECTIVE  Feeling much better today. No more CP or vomiting    CURRENT MEDS . atorvastatin  40 mg Oral q1800  . clopidogrel  75 mg Oral Daily  . glimepiride  4 mg Oral Q breakfast  . heparin  5,000 Units Subcutaneous Q8H  . insulin aspart  0-15 Units Subcutaneous TID WC  . irbesartan  75 mg Oral Daily  . levothyroxine  100 mcg Oral Daily  . metoprolol tartrate  25 mg Oral BID  . nitroGLYCERIN  1 inch Topical Q6H  . pantoprazole  40 mg Oral Daily  . sucralfate  1 g Oral Q6H    OBJECTIVE  Filed Vitals:   12/05/15 1843 12/05/15 2120 12/05/15 2300 12/06/15 0515  BP: 135/88 121/81 92/56 95/58   Pulse: 108 100 85 84  Temp: 98.2 F (36.8 C) 98.2 F (36.8 C)  98.1 F (36.7 C)  TempSrc: Oral Oral  Oral  Resp:  16  16  Height: 5\' 6"  (1.676 m)     Weight: 172 lb 8 oz (78.245 kg)   169 lb 4.8 oz (76.794 kg)  SpO2: 97% 95%  96%    Intake/Output Summary (Last 24 hours) at 12/06/15 0841 Last data filed at 12/05/15 2300  Gross per 24 hour  Intake    500 ml  Output    225 ml  Net    275 ml   Filed Weights   12/05/15 1843 12/06/15 0515  Weight: 172 lb 8 oz (78.245 kg) 169 lb 4.8 oz (76.794 kg)    PHYSICAL EXAM  General: Pleasant, NAD. Neuro: Alert and oriented X 3. Moves all extremities spontaneously. Psych: Normal affect. HEENT:  Normal  Neck: Supple without bruits or JVD. Lungs:  Resp regular and unlabored, CTA. Heart: RRR no s3, s4, or murmurs. Abdomen: Soft, non-tender, non-distended, BS + x 4.  Extremities: No clubbing, cyanosis or edema. DP/PT/Radials 2+ and equal bilaterally.  Accessory Clinical Findings  CBC  Recent Labs  12/05/15 1300 12/05/15 1901  WBC 5.3 4.8  HGB 14.9 14.8  HCT 45.4 45.4  MCV 75.3* 77.2*  PLT 189 [id number]   Basic Metabolic Panel  Recent Labs  12/05/15 1300 12/05/15 1901 12/06/15 0538  NA 138  --  137  K 4.1   --  3.5  CL 104  --  102  CO2 22  --  25  GLUCOSE 183*  --  121*  BUN 28*  --  27*  CREATININE 1.07 1.15 1.35*  CALCIUM 9.1  --  8.2*   Liver Function Tests  Recent Labs  12/05/15 1409  AST 24  ALT 31  ALKPHOS 58  BILITOT 1.9*  PROT 7.1  ALBUMIN 3.5    Recent Labs  12/05/15 1409  LIPASE 32   Cardiac Enzymes  Recent Labs  12/05/15 1901 12/06/15 0034 12/06/15 0538  TROPONINI <0.03 0.03 <0.03   BNP Invalid input(s): POCBNP D-Dimer  Recent Labs  12/05/15 1551  DDIMER 1.69*   TELE  NSR with PACs   Radiology/Studies  Dg Chest 2 View  12/05/2015  CLINICAL DATA:  Onset of vomiting at 0200 hours today, epigastric pain, dizziness, chest heaviness, shortness of breath, history hypertension, coronary artery disease post MI coronary stenting and CABG, former smoker EXAM: CHEST  2 VIEW COMPARISON:  11/03/2013 FINDINGS: Upper normal heart size post CABG. Atherosclerotic calcification aorta. Mediastinal contours  and pulmonary vascularity normal. Eventration of RIGHT diaphragm again noted. Minimal LEFT basilar atelectasis. Remaining lungs clear. No pleural effusion or pneumothorax. Bones demineralized. IMPRESSION: Post CABG. Chronic eventration anterior RIGHT diaphragm. Minimal LEFT basilar atelectasis. Electronically Signed   By: Lavonia Dana M.D.   On: 12/05/2015 14:13   Ct Angio Chest Pe W/cm &/or Wo Cm  12/05/2015  CLINICAL DATA:  81 year old male with acute chest pain and shortness of breath for 1 day. Elevated D-dimer. EXAM: CT ANGIOGRAPHY CHEST WITH CONTRAST TECHNIQUE: Multidetector CT imaging of the chest was performed using the standard protocol during bolus administration of intravenous contrast. Multiplanar CT image reconstructions and MIPs were obtained to evaluate the vascular anatomy. CONTRAST:  80 cc intravenous Isovue 300 COMPARISON:  12/05/2015 and prior chest radiographs. 07/04/2011 chest CT FINDINGS: This is a technically adequate study. Mediastinum/Nodes: No  pulmonary emboli are identified. Cardiomegaly and CABG changes noted. There is no evidence of pericardial effusion or enlarged lymph nodes. No thoracic aortic aneurysm identified. Lungs/Pleura: Mild left lower lung scarring again noted. There is no evidence of airspace disease, consolidation, mass, nodule, pleural effusion or pneumothorax. Upper abdomen: No acute abnormality Musculoskeletal: No acute or suspicious focal abnormalities. Review of the MIP images confirms the above findings. IMPRESSION: No evidence of acute abnormality. No evidence of pulmonary emboli or thoracic aortic aneurysm. Cardiomegaly, CABG changes and mild left lower lung scarring. Electronically Signed   By: Margarette Canada M.D.   On: 12/05/2015 20:06   US Abdomen Complete  12/06/2015  CLINICAL DATA:  Indigestion, chest pain EXAM: ABDOMEN ULTRASOUND COMPLETE COMPARISON:  03/07/2013 FINDINGS: Gallbladder: No gallstones or wall thickening visualized. No sonographic Murphy sign noted by sonographer. Common bile duct: Diameter: 3.7 mm in diameter within normal limits. Liver: No focal hepatic mass. There is heterogeneous increased echogenicity of the liver suspicious for fatty infiltration. IVC:  There is  limited visualization due to abundant bowel gas Pancreas: Limited visualization due to abundant bowel gas. Spleen: Spleen measures 8.5 cm in length.  No focal mass. Right Kidney: Length: 10.7 cm in length. Normal echogenicity. No hydronephrosis. There is a midpole cyst measures 1.5 x 1.6 cm. Left Kidney: Length: 11.2 cm in length. Echogenicity within normal limits. No mass or hydronephrosis visualized. Abdominal aorta: There is aneurysmal dilatation of abdominal aorta measures 3.6 x 3.5 cm. On the prior exam measures 3.4 cm. Recommend followup by ultrasound in 2 years. This recommendation follows ACR consensus guidelines: White Paper of the ACR Incidental Findings Committee II on Vascular Findings. J Am Coll Radiol 2013; 10:789-794. Other findings:  None. IMPRESSION: 1. No gallstones are noted within gallbladder.  Normal CBD. 2. Diffuse increased echogenicity of the liver suspicious for fatty infiltration. 3. There is aneurysmal dilatation of abdominal aorta measures 3.6 x 3.5 cm. On the prior exam measures 3.4 cm. Recommend followup by ultrasound in 2 years. This recommendation follows ACR consensus guidelines: White Paper of the ACR Incidental Findings Committee II on Vascular Findings. J Am Coll Radiol 2013; 10:789-794. 4. No hydronephrosis.  Right renal cyst measures 1.6 cm. Electronically Signed   By: Lahoma Crocker M.D.   On: 12/06/2015 08:15    ASSESSMENT AND PLAN 81 y/o ? with a h/o CAD s/p CABG (2001) s/p DES to pRCA (2010), s/p multiple PCIs to RCA in the past, HTN, HLD, DM II, who presented to the ED 12/05/15 with a 12 hr h/o nausea and indigestion followed by sscp/back pain, and dyspnea similar to his sx prior to his MI.  Chest pain: he has ruled out for MI, but chest pain similar to previous sx during heart attack. Now chest pain and nausea have resolved. Bilirubin mildly elevated. Lipase normal. Abdominal US with no acute findings. CTA with no PE (elevated D dimer). Discussed with Dr. Sallyanne Kuster who feels we should proceed with coronary angiography.   I have reviewed the risks, indications, and alternatives to cardiac catheterization and possible angioplasty/stenting with the patient. Risks include but are not limited to bleeding, infection, vascular injury, stroke, myocardial infection, arrhythmia, kidney injury, radiation-related injury in the case of prolonged fluoroscopy use, emergency cardiac surgery, and death. The patient understands the risks of serious complication is low ([id number]).   CAD: extensive hx of CAD: CABG 2001 with early failure of VG-RCA. b. Inf MI after Lexiscan 01/28/2009 s/p RCA stent. c. Stent to dRCA 02/24/09. d. DES to [place] 2012. e. NSTEMI s/p angioplasty for ISR of RCA 09/2011. f. 05/2012 Cath: LM 20-30p, LAD 100p,  LCX min irregs, RCA patent prox stent, 93m ISR, VG->Diag nl w/ dzs in D1 prox to graft, VG->RCA 100, VG->OM nl, LIMA->LAD nl, Nl EF->Med Rx; g. 09/2014 low-risk MV. -- Continue ASA/plavix, statin and BB.  Hypertensive heart Disease:BP is soft. Home diuretic held in setting of presumed dehydration with elev BUN and tachycardia.Cont ? blocker, ARB.  HLD: LDL 44 in 08/2015. Cont statin.  Type II DM: On metformin and amaryl @ home. Add SSI.  Hypothyroidism: Cont synthroid.    Mable Fill R PA-C  Pager [phone]  I have seen and examined the patient along with Angelena Form R PA-C.  I have reviewed the chart, notes and new data.  I agree with PA's note.  Key new complaints: feeling better, vomiting and pain have subsided. His interscapular pain and the intractable vomiting are identical to his presentation with his last MI Key examination changes: no CHF, no arrhythmia Key new findings / data: normal cardiac enzymes and no acute ECG ischemic chamges  PLAN: I recommend proceeding directly to coronary angiography since his symptoms are so typical and he has a history of disease progression and restenosis after his revascularization procedures. This procedure has been fully reviewed with the patient and written informed consent has been obtained.   Sanda Klein, MD, [place] [phone] 12/06/2015, 9:19 AM

## 2015-12-07 ENCOUNTER — Encounter (HOSPITAL_COMMUNITY): Payer: Self-pay | Admitting: Cardiovascular Disease

## 2015-12-07 DIAGNOSIS — I200 Unstable angina: Secondary | ICD-10-CM

## 2015-12-07 LAB — BASIC METABOLIC PANEL
ANION GAP: 7 (ref 5–15)
BUN: 21 mg/dL — ABNORMAL HIGH (ref 6–20)
CALCIUM: 8.3 mg/dL — AB (ref 8.9–10.3)
CO2: 25 mmol/L (ref 22–32)
Chloride: 106 mmol/L (ref 101–111)
Creatinine, Ser: 1.19 mg/dL (ref 0.61–1.24)
GFR, EST NON AFRICAN AMERICAN: 57 mL/min — AB (ref >60)
Glucose, Bld: 142 mg/dL — ABNORMAL HIGH (ref 65–99)
Potassium: 4 mmol/L (ref 3.5–5.1)
Sodium: 138 mmol/L (ref 135–145)

## 2015-12-07 LAB — GLUCOSE, CAPILLARY
Glucose-Capillary: 58 mg/dL — ABNORMAL LOW (ref 65–99)
Glucose-Capillary: 132 mg/dL — ABNORMAL HIGH (ref 65–99)
Glucose-Capillary: 148 mg/dL — ABNORMAL HIGH (ref 65–99)

## 2015-12-07 LAB — CBC
HCT: 38.6 % — ABNORMAL LOW (ref 39.0–52.0)
HEMOGLOBIN: 12.3 g/dL — AB (ref 13.0–17.0)
MCH: 24.4 pg — ABNORMAL LOW (ref 26.0–34.0)
MCHC: 31.9 g/dL (ref 30.0–36.0)
MCV: 76.6 fL — ABNORMAL LOW (ref 78.0–100.0)
Platelets: 169 10*3/uL (ref 150–400)
RBC: 5.04 MIL/uL (ref 4.22–5.81)
RDW: 15.7 % — ABNORMAL HIGH (ref 11.5–15.5)
WBC: 5.3 10*3/uL (ref 4.0–10.5)

## 2015-12-07 MED FILL — Nitroglycerin IV Soln 100 MCG/ML in D5W: INTRA_ARTERIAL | Qty: 10 | Status: AC

## 2015-12-07 NOTE — Care Management Note (Signed)
Case Management Note  Patient Details  Name: Benjamin Harrison MRN: [id number] Date of Birth: 12/21/1934  Subjective/Objective:   Patient is from home s/p coronary balloon angioplasty, NCM will cont to follow for dc needs.                 Action/Plan:   Expected Discharge Date:                  Expected Discharge Plan:  Home/Self Care  In-House Referral:     Discharge planning Services  CM Consult  Post Acute Care Choice:    Choice offered to:     DME Arranged:    DME Agency:     HH Arranged:    [place] Agency:     Status of Service:  Completed, signed off  Medicare Important Message Given:    Date Medicare IM Given:    Medicare IM give by:    Date Additional Medicare IM Given:    Additional Medicare Important Message give by:     If discussed at [place] of Stay Meetings, dates discussed:    Additional Comments:  Zenon Mayo, RN 12/07/2015, 11:13 AM

## 2015-12-07 NOTE — Progress Notes (Signed)
Patient Name: Benjamin Harrison Date of Encounter: 12/07/2015  Hospital Problem List     Active Problems:   Unstable angina [facility])    Subjective   Feeling well this morning. No c/o of chest pain/pressure/nausea.   Inpatient Medications    . atorvastatin  40 mg Oral q1800  . clopidogrel  75 mg Oral Daily  . glimepiride  4 mg Oral Q breakfast  . insulin aspart  0-15 Units Subcutaneous TID WC  . irbesartan  75 mg Oral Daily  . levothyroxine  100 mcg Oral Daily  . metoprolol tartrate  25 mg Oral BID  . pantoprazole  40 mg Oral Daily  . sodium chloride flush  3 mL Intravenous Q12H  . sucralfate  1 g Oral Q6H    Vital Signs    Filed Vitals:   12/06/15 2000 12/06/15 2100 12/06/15 2200 12/07/15 0507  BP: 120/63 91/48 96/43  116/76  Pulse: 75 66 62 68  Temp: 98.3 F (36.8 C)   98.1 F (36.7 C)  TempSrc: Oral   Oral  Resp: 21 20 18 19   Height:      Weight:    169 lb 12.1 oz (77 kg)  SpO2: 95% 95% 96% 99%    Intake/Output Summary (Last 24 hours) at 12/07/15 0700 Last data filed at 12/07/15 0509  Gross per 24 hour  Intake 1049.88 ml  Output    400 ml  Net 649.88 ml   Filed Weights   12/05/15 1843 12/06/15 0515 12/07/15 0507  Weight: 172 lb 8 oz (78.245 kg) 169 lb 4.8 oz (76.794 kg) 169 lb 12.1 oz (77 kg)    Physical Exam    General: Pleasant, NAD. Neuro: Alert and oriented X 3. Moves all extremities spontaneously. Psych: Normal affect. HEENT:  Normal  Neck: Supple without bruits or JVD. Lungs:  Resp regular and unlabored, CTA. Heart: RRR no s3, s4, or 1/6 systolic murmur. Abdomen: Soft, non-tender, non-distended, BS + x 4.  Extremities: No clubbing, cyanosis or edema. DP/PT/Radials 2+ and equal bilaterally. Left wrist with minimal bruising, no hematoma.   Labs    CBC  Recent Labs  12/05/15 1901 12/07/15 0404  WBC 4.8 5.3  HGB 14.8 12.3*  HCT 45.4 38.6*  MCV 77.2* 76.6*  PLT 211 [id number]   Basic Metabolic Panel  Recent Labs  12/06/15 0538  12/07/15 0404  NA 137 138  K 3.5 4.0  CL 102 106  CO2 25 25  GLUCOSE 121* 142*  BUN 27* 21*  CREATININE 1.35* 1.19  CALCIUM 8.2* 8.3*   Liver Function Tests  Recent Labs  12/05/15 1409  AST 24  ALT 31  ALKPHOS 58  BILITOT 1.9*  PROT 7.1  ALBUMIN 3.5    Recent Labs  12/05/15 1409  LIPASE 32   Cardiac Enzymes  Recent Labs  12/05/15 1901 12/06/15 0034 12/06/15 0538  TROPONINI <0.03 0.03 <0.03   D-Dimer  Recent Labs  12/05/15 1551  DDIMER 1.69*   Hemoglobin A1C  Recent Labs  12/05/15 1901  HGBA1C 6.5*    Telemetry    SR Rate-70s   ECG    SR Rate-65 no acute changes  Radiology    US Abdomen Complete  12/06/2015  CLINICAL DATA:  Indigestion, chest pain EXAM: ABDOMEN ULTRASOUND COMPLETE COMPARISON:  03/07/2013 FINDINGS: Gallbladder: No gallstones or wall thickening visualized. No sonographic Murphy sign noted by sonographer. Common bile duct: Diameter: 3.7 mm in diameter within normal limits. Liver: No focal hepatic mass. There is heterogeneous  increased echogenicity of the liver suspicious for fatty infiltration. IVC:  There is  limited visualization due to abundant bowel gas Pancreas: Limited visualization due to abundant bowel gas. Spleen: Spleen measures 8.5 cm in length.  No focal mass. Right Kidney: Length: 10.7 cm in length. Normal echogenicity. No hydronephrosis. There is a midpole cyst measures 1.5 x 1.6 cm. Left Kidney: Length: 11.2 cm in length. Echogenicity within normal limits. No mass or hydronephrosis visualized. Abdominal aorta: There is aneurysmal dilatation of abdominal aorta measures 3.6 x 3.5 cm. On the prior exam measures 3.4 cm. Recommend followup by ultrasound in 2 years. This recommendation follows ACR consensus guidelines: White Paper of the ACR Incidental Findings Committee II on Vascular Findings. J Am Coll Radiol 2013; 10:789-794. Other findings: None. IMPRESSION: 1. No gallstones are noted within gallbladder.  Normal CBD. 2. Diffuse  increased echogenicity of the liver suspicious for fatty infiltration. 3. There is aneurysmal dilatation of abdominal aorta measures 3.6 x 3.5 cm. On the prior exam measures 3.4 cm. Recommend followup by ultrasound in 2 years. This recommendation follows ACR consensus guidelines: White Paper of the ACR Incidental Findings Committee II on Vascular Findings. J Am Coll Radiol 2013; 10:789-794. 4. No hydronephrosis.  Right renal cyst measures 1.6 cm. Electronically Signed   By: Natasha Mead M.D.   On: 12/06/2015 08:15    Assessment & Plan    81 y/o ? with a h/o CAD s/p CABG (2001) s/p DES to pRCA (2010), s/p multiple PCIs to RCA in the past, HTN, HLD, DM II, who presented to the ED 12/05/15 with a 12 hr h/o nausea and indigestion followed by sscp/back pain, and dyspnea similar to his sx prior to his MI.   1. Chest pain: he has ruled out for MI, but chest pain was similar to previous sx during heart attack. Now chest pain and nausea have resolved. Bilirubin mildly elevated. Lipase normal. Abdominal US with no acute findings. CTA with no PE (elevated D dimer).  Micah Flesher for [facility] cath yesterday with successful PTCA with angiosculpt cutting balloon in the mid and distal RCA.  Conclusion     Prox RCA lesion, 80% stenosed.  Dist RCA lesion, 70% stenosed.  Mid RCA lesion, 90% stenosed.  Ost RPDA to RPDA lesion, 40% stenosed.  SVG was not injected .  Known to be occluded.  Mid Cx lesion, 99% stenosed.  2nd Mrg lesion, 80% stenosed.  SVG was injected is normal in caliber, and is anatomically normal.  Prox LAD lesion, 100% stenosed.  Ost 1st Diag lesion, 99% stenosed.  SVG was injected is normal in caliber, and is anatomically normal.  LIMA was injected is normal in caliber, and is anatomically normal.  Ost LM lesion, 40% stenosed.  1st Mrg lesion, 30% stenosed.  Ost LAD to Prox LAD lesion, 50% stenosed.  1. 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  2. The graft to the  native RCA is known to be occluded. The RCA is stented in the mid and distal segments. There are three areas of severe restenosis in the mid and distal RCA.  3. Successful PTCA with Angiosculpt cutting balloon in the mid and distal RCA 4. Moderate left main stenosis (this supplies the AV groove Circumflex) 5. Severe stenosis OM1 with patent graft to the OM1.  6. Chronically occluded proximal LAD. The Diagonal fills from the patent vein graft. The mid and distal LAD fills from the patent IMA graft.   Recommendations: Will continue ASA,  Plavix, beta blocker and statin. If he has recurrence of anginal symptoms, may need consider placing new stents but this will be a third layer of stenting in several segments.    -Recommendations for continuation of ASA, plavix, BB and statin. -Cr and Hgb stable this am.  -No c/o chest pain/pressure/nausea.   2. CAD: extensive hx of CAD: CABG 2001 with early failure of VG-RCA. b. Inf MI after Lexiscan 01/28/2009 s/p RCA stent. c. Stent to dRCA 02/24/09. d. DES to [place] 2012. e. NSTEMI s/p angioplasty for ISR of RCA 09/2011. f. 05/2012 Cath: LM 20-30p, LAD 100p, LCX min irregs, RCA patent prox stent, 15m ISR, VG->Diag nl w/ dzs in D1 prox to graft, VG->RCA 100, VG->OM nl, LIMA->LAD nl, Nl EF->Med Rx; g. 09/2014 low-risk MV. -- Continue ASA/plavix, statin and BB.  3. Hypertensive heart Disease:Stable on current medications.   4. HLD: LDL 44 in 08/2015. Cont statin.  5. Type II DM: On metformin and amaryl @ home. Add SSI.  6. Hypothyroidism: Cont synthroid.  Signed, Reino Bellis NP-C Pager [phone]   Patient examined chart reviewed No pain. Left radial cath sight A Exam otherwise remarkable for obesity BP well controlled Ready for d/c  Outpatient f/u Dr Brantley Persons

## 2015-12-07 NOTE — Discharge Summary (Signed)
Discharge Summary    Patient ID: LINDBURG BANNER,  MRN: [id number], DOB/AGE: [birth year] 79 y.o.  Admit date: 12/05/2015 Discharge date: 12/07/2015  Primary Care Provider: Leonard Downing Primary Cardiologist: Dr. Cathie Olden  Discharge Diagnoses    Active Problems:   Unstable angina ([place])   Hypertension   Hyperlipidemia   Diabetes mellitus ([place])   Allergies Allergies  Allergen Reactions  . Codeine     Makes pt Sick    Diagnostic Studies/Procedures    Cath: 12/06/2015:  Conclusion     Prox RCA lesion, 80% stenosed.  Dist RCA lesion, 70% stenosed.  Mid RCA lesion, 90% stenosed.  Ost RPDA to RPDA lesion, 40% stenosed.  SVG was not injected .  Known to be occluded.  Mid Cx lesion, 99% stenosed.  2nd Mrg lesion, 80% stenosed.  SVG was injected is normal in caliber, and is anatomically normal.  Prox LAD lesion, 100% stenosed.  Ost 1st Diag lesion, 99% stenosed.  SVG was injected is normal in caliber, and is anatomically normal.  LIMA was injected is normal in caliber, and is anatomically normal.  Ost LM lesion, 40% stenosed.  1st Mrg lesion, 30% stenosed.  Ost LAD to Prox LAD lesion, 50% stenosed.  1. 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  2. The graft to the native RCA is known to be occluded. The RCA is stented in the mid and distal segments. There are three areas of severe restenosis in the mid and distal RCA.  3. Successful PTCA with Angiosculpt cutting balloon in the mid and distal RCA 4. Moderate left main stenosis (this supplies the AV groove Circumflex) 5. Severe stenosis OM1 with patent graft to the OM1.  6. Chronically occluded proximal LAD. The Diagonal fills from the patent vein graft. The mid and distal LAD fills from the patent IMA graft.   Recommendations: Will continue ASA, Plavix, beta blocker and statin. If he has recurrence of anginal symptoms, may need consider placing new stents but this will be a  third layer of stenting in several segments.      _____________   History of Present Illness     81 y/o male with a h/o CAD s/p CABG, HTN, HL, DM II, who presented to the ED today with a 12 hr h/o nausea and indigestion followed by sscp/back pain, and dyspnea.  Hospital Course    81 y/o male with the above complex PMH including CAD s/p CABG in [id number], complicated by early graft failure of the VG  RCA. In that setting, he has undergone multiple interventions within the native RCA between 2010 and 2013. Last cath in 05/2012 revealed patent prox RCA stent w/ 50% ISR in the mid-RCA stents, along with patent LIMA  LAD, VG  Diag, and VG  OM1. The native D1 had severe proximal dzs and filled retrograde from the graft. It was felt that that area may be responsible for his angina @ the time and he has since been managed successfully with imdur. His last stress test was in 09/2014 and was low-risk. He was last seen in clinic by Dr. Acie Fredrickson in January @ which time he reported only occasional chest pain requiring SL ntg. He says that he uses a SL ntg < once/month.  Reports he was in his usual state of health until 5/6 when he experienced mild nausea that progressed to indigestion, vomiting, and substernal chest heaviness that radiated to his back with dyspnea. This brought him to the  [facility], where he was noted to be tachycardic, treated with SL nitro x3 and zofran. His cardiac troponins were cycled and he was ruled out for MI, but his symptoms were reported as similar to previous MI. On 12/06/2015 it was decided that he would undergo coronary angiography. His cath showed his previous triple vessel CAD s/p CABG with 3/4 patent grafts,RCA is stented in the mid and distal segments, and there are three areas of severe restenosis in the mid and distal RCA. Successful PTCA with angiosculpt cutting balloon in the mid and distal RCA. Chronically occluded prox LAD with patent VG. Recommendations were to continue  ASA, Plavix, BB, and statin therapy.   On 12/07/2015 post cath, he denied any recurrent chest pain/dyspnea/abd pain/nausea. His labs post cath showed Cr 1.19, Hgb 12.3. EKG showed no acute changes. The patient was seen and examined by Dr. Johnsie Cancel and determined stable for discharge.   _____________  Discharge Vitals Blood pressure 124/61, pulse 62, temperature 98 F (36.7 C), temperature source Oral, resp. rate 17, height 5\' 6"  (1.676 m), weight 169 lb 12.1 oz (77 kg), SpO2 96 %.  Filed Weights   12/05/15 1843 12/06/15 0515 12/07/15 0507  Weight: 172 lb 8 oz (78.245 kg) 169 lb 4.8 oz (76.794 kg) 169 lb 12.1 oz (77 kg)    Labs & Radiologic Studies    CBC  Recent Labs  12/05/15 1901 12/07/15 0404  WBC 4.8 5.3  HGB 14.8 12.3*  HCT 45.4 38.6*  MCV 77.2* 76.6*  PLT 211 [id number]   Basic Metabolic Panel  Recent Labs  12/06/15 0538 12/07/15 0404  NA 137 138  K 3.5 4.0  CL 102 106  CO2 25 25  GLUCOSE 121* 142*  BUN 27* 21*  CREATININE 1.35* 1.19  CALCIUM 8.2* 8.3*   Liver Function Tests  Recent Labs  12/05/15 1409  AST 24  ALT 31  ALKPHOS 58  BILITOT 1.9*  PROT 7.1  ALBUMIN 3.5    Recent Labs  12/05/15 1409  LIPASE 32   Cardiac Enzymes  Recent Labs  12/05/15 1901 12/06/15 0034 12/06/15 0538  TROPONINI <0.03 0.03 <0.03   BNP Invalid input(s): POCBNP D-Dimer  Recent Labs  12/05/15 1551  DDIMER 1.69*   Hemoglobin A1C  Recent Labs  12/05/15 1901  HGBA1C 6.5*   _____________  Dg Chest 2 View  12/05/2015  CLINICAL DATA:  Onset of vomiting at 0200 hours today, epigastric pain, dizziness, chest heaviness, shortness of breath, history hypertension, coronary artery disease post MI coronary stenting and CABG, former smoker EXAM: CHEST  2 VIEW COMPARISON:  11/03/2013 FINDINGS: Upper normal heart size post CABG. Atherosclerotic calcification aorta. Mediastinal contours and pulmonary vascularity normal. Eventration of RIGHT diaphragm again noted. Minimal LEFT  basilar atelectasis. Remaining lungs clear. No pleural effusion or pneumothorax. Bones demineralized. IMPRESSION: Post CABG. Chronic eventration anterior RIGHT diaphragm. Minimal LEFT basilar atelectasis. Electronically Signed   By: Lavonia Dana M.D.   On: 12/05/2015 14:13   Ct Angio Chest Pe W/cm &/or Wo Cm  12/05/2015  CLINICAL DATA:  81 year old male with acute chest pain and shortness of breath for 1 day. Elevated D-dimer. EXAM: CT ANGIOGRAPHY CHEST WITH CONTRAST TECHNIQUE: Multidetector CT imaging of the chest was performed using the standard protocol during bolus administration of intravenous contrast. Multiplanar CT image reconstructions and MIPs were obtained to evaluate the vascular anatomy. CONTRAST:  80 cc intravenous Isovue 300 COMPARISON:  12/05/2015 and prior chest radiographs. 07/04/2011 chest CT FINDINGS: This is a  technically adequate study. Mediastinum/Nodes: No pulmonary emboli are identified. Cardiomegaly and CABG changes noted. There is no evidence of pericardial effusion or enlarged lymph nodes. No thoracic aortic aneurysm identified. Lungs/Pleura: Mild left lower lung scarring again noted. There is no evidence of airspace disease, consolidation, mass, nodule, pleural effusion or pneumothorax. Upper abdomen: No acute abnormality Musculoskeletal: No acute or suspicious focal abnormalities. Review of the MIP images confirms the above findings. IMPRESSION: No evidence of acute abnormality. No evidence of pulmonary emboli or thoracic aortic aneurysm. Cardiomegaly, CABG changes and mild left lower lung scarring. Electronically Signed   By: Margarette Canada M.D.   On: 12/05/2015 20:06   US Abdomen Complete  12/06/2015  CLINICAL DATA:  Indigestion, chest pain EXAM: ABDOMEN ULTRASOUND COMPLETE COMPARISON:  03/07/2013 FINDINGS: Gallbladder: No gallstones or wall thickening visualized. No sonographic Murphy sign noted by sonographer. Common bile duct: Diameter: 3.7 mm in diameter within normal limits.  Liver: No focal hepatic mass. There is heterogeneous increased echogenicity of the liver suspicious for fatty infiltration. IVC:  There is  limited visualization due to abundant bowel gas Pancreas: Limited visualization due to abundant bowel gas. Spleen: Spleen measures 8.5 cm in length.  No focal mass. Right Kidney: Length: 10.7 cm in length. Normal echogenicity. No hydronephrosis. There is a midpole cyst measures 1.5 x 1.6 cm. Left Kidney: Length: 11.2 cm in length. Echogenicity within normal limits. No mass or hydronephrosis visualized. Abdominal aorta: There is aneurysmal dilatation of abdominal aorta measures 3.6 x 3.5 cm. On the prior exam measures 3.4 cm. Recommend followup by ultrasound in 2 years. This recommendation follows ACR consensus guidelines: White Paper of the ACR Incidental Findings Committee II on Vascular Findings. J Am Coll Radiol 2013; 10:789-794. Other findings: None. IMPRESSION: 1. No gallstones are noted within gallbladder.  Normal CBD. 2. Diffuse increased echogenicity of the liver suspicious for fatty infiltration. 3. There is aneurysmal dilatation of abdominal aorta measures 3.6 x 3.5 cm. On the prior exam measures 3.4 cm. Recommend followup by ultrasound in 2 years. This recommendation follows ACR consensus guidelines: White Paper of the ACR Incidental Findings Committee II on Vascular Findings. J Am Coll Radiol 2013; 10:789-794. 4. No hydronephrosis.  Right renal cyst measures 1.6 cm. Electronically Signed   By: Lahoma Crocker M.D.   On: 12/06/2015 08:15   Disposition   Pt is being discharged home today in good condition.  Follow-up Plans & Appointments    Follow-up Information    Follow up with Lyda Jester, PA-C On 12/23/2015.   Specialties:  Cardiology, Radiology   Why:  11am with Dr. Julious Payer PA for hospital follow up   Contact information:   [place] [address] [phone]      Discharge Instructions    Amb Referral to Cardiac  Rehabilitation    Complete by:  As directed   Diagnosis:  PTCA     Diet - low sodium heart healthy    Complete by:  As directed      Increase activity slowly    Complete by:  As directed            Discharge Medications   Current Discharge Medication List    CONTINUE these medications which have NOT CHANGED   Details  aspirin 81 MG tablet Take 1 tablet (81 mg total) by mouth daily.    atorvastatin (LIPITOR) 40 MG tablet Take 1 tablet by mouth  daily Qty: 90 tablet, Refills: 3    clopidogrel (PLAVIX)  75 MG tablet Take 1 tablet by mouth  daily Qty: 90 tablet, Refills: 3    Esomeprazole Magnesium (NEXIUM PO) Take 22.3 mg by mouth daily.    glimepiride (AMARYL) 4 MG tablet Take 4 mg by mouth daily with breakfast.    hydrochlorothiazide (HYDRODIURIL) 25 MG tablet Take 1 tablet (25 mg total) by mouth daily. Qty: 90 tablet, Refills: 3    isosorbide mononitrate (IMDUR) 60 MG 24 hr tablet Take 1 tablet by mouth  daily Qty: 90 tablet, Refills: 3    levothyroxine (SYNTHROID, LEVOTHROID) 100 MCG tablet Take 100 mcg by mouth daily.    metFORMIN (GLUCOPHAGE) 1000 MG tablet Take by mouth daily with breakfast.     metoprolol tartrate (LOPRESSOR) 25 MG tablet Take 1 tablet by mouth two  times daily Qty: 180 tablet, Refills: 3    nitroGLYCERIN (NITROSTAT) 0.4 MG SL tablet Place 1 tablet (0.4 mg total) under the tongue every 5 (five) minutes as needed. For chest pain. Qty: 25 tablet, Refills: 6    valsartan (DIOVAN) 80 MG tablet Take 1 tablet by mouth  daily Qty: 90 tablet, Refills: 3         Aspirin prescribed at discharge?  Yes High Intensity Statin Prescribed? (Lipitor 40-80mg  or Crestor 20-40mg ): Yes Beta Blocker Prescribed? Yes For EF <40%, was ACEI/ARB Prescribed? Yes ADP Receptor Inhibitor Prescribed? (i.e. Plavix etc.-Includes Medically Managed Patients): Yes For EF <40%, Aldosterone Inhibitor Prescribed? No:  Was EF assessed during THIS hospitalization? Yes Was Cardiac  Rehab II ordered? (Included Medically managed Patients): yes:    Outstanding Labs/Studies   None  Duration of Discharge Encounter   Greater than 30 minutes including physician time.  Signed, Reino Bellis NP-C 12/07/2015, 11:23 AM

## 2015-12-07 NOTE — Discharge Instructions (Signed)

## 2015-12-07 NOTE — Progress Notes (Signed)
CARDIAC REHAB PHASE I   PRE:  Rate/Rhythm: 68 SR  BP:  Supine: 126/76  Sitting:   Standing:    SaO2:   MODE:  Ambulation: 200 ft   POST:  Rate/Rhythm: 83 SR  BP:  Supine: 151/68  Sitting:   Standing:    SaO2:  0740-0840 Pt walked 200 ft with asst x 1 with steady gait. No CP. C/o knee bothering him from arthritis and not being up. Education completed with pt who voiced understanding. Gave diabetic and heart healthy diets and discussed carb counting. Discussed CRP 2 and referring to [facility] program. Reviewed NTG use.   Graylon Good, RN BSN  12/07/2015 8:34 AM

## 2015-12-07 NOTE — Progress Notes (Signed)
Inpatient Diabetes Program Recommendations  AACE/ADA: New Consensus Statement on Inpatient Glycemic Control (2015)  Target Ranges:  Prepandial:   less than 140 mg/dL      Peak postprandial:   less than 180 mg/dL (1-2 hours)      Critically ill patients:  140 - 180 mg/dL   Review of Glycemic Control Results for IBIN, CIPRES (MRN [id number]) as of 12/07/2015 10:21  Ref. Range 12/06/2015 17:46 12/06/2015 18:12 12/06/2015 18:43 12/06/2015 21:32 12/07/2015 06:49  Glucose-Capillary Latest Ref Range: 65-99 mg/dL 58 (L) 60 (L) 74 99 132 (H)   Diabetes history: DM Type 2 Outpatient Diabetes medications: Amaryl 4 mg bid + Metformin 1 gm bid (per patient) Current orders for Inpatient glycemic control: Amaryl 4 mg + Novolog correction 0-15 units tid  Inpatient Diabetes Program Recommendations:  Spoke with patient and wife. Patient states his medications were recently doubled due to A1c >7%. Reviewed hypoglycemia protocol with patient and wife. Patient checks fasting CBGs but plans to check in the pm as needed also.  Thank you, Nani Gasser. Ailen Strauch, RN, MSN, CDE Inpatient Glycemic Control Team Team Pager [phone] (8am-5pm) 12/07/2015 10:24 AM

## 2015-12-20 ENCOUNTER — Encounter: Payer: Self-pay | Admitting: *Deleted

## 2015-12-23 ENCOUNTER — Ambulatory Visit (INDEPENDENT_AMBULATORY_CARE_PROVIDER_SITE_OTHER): Payer: Medicare Other | Admitting: Cardiology

## 2015-12-23 ENCOUNTER — Encounter: Payer: Self-pay | Admitting: Cardiology

## 2015-12-23 VITALS — BP 118/80 | HR 69 | Ht 64.0 in | Wt 171.8 lb

## 2015-12-23 DIAGNOSIS — I2510 Atherosclerotic heart disease of native coronary artery without angina pectoris: Secondary | ICD-10-CM | POA: Diagnosis not present

## 2015-12-23 DIAGNOSIS — I10 Essential (primary) hypertension: Secondary | ICD-10-CM | POA: Diagnosis not present

## 2015-12-23 NOTE — Patient Instructions (Signed)
Medication Instructions:  Your physician recommends that you continue on your current medications as directed. Please refer to the Current Medication list given to you today.   Labwork: None ordered  Testing/Procedures: None ordered  Follow-Up: Follow up a planned in August 2017 with Dr.[name]  Any Other Special Instructions Will Be Listed Below (If Applicable).     If you need a refill on your cardiac medications before your next appointment, please call your pharmacy.

## 2015-12-23 NOTE — Progress Notes (Signed)
12/23/2015 CLOUD OBERBROECKLING   28-Mar-1935  [id number]  Primary Physician Leonard Downing, MD Primary Cardiologist: Dr. Acie Fredrickson   Reason for Visit/CC: [facility] F/U for CAD  HPI:  [name] presents to clinic for post hospital follow-up. He is followed by Dr. Acie Fredrickson. He is a 81 y/o male with a h/o CAD s/p CABG, HTN, HL, DM II, who presented to the [facility] ED 12/05/15 with a 12 hr h/o nausea and indigestion followed by sscp/back pain, and dyspnea. He was admitted for unstable angina. His cardiac troponins were cycled and he was ruled out for MI, but his symptoms were reported as similar to previous MI. On 12/06/2015 it was decided that he would undergo coronary angiography. His cath showed his previous triple vessel CAD s/p CABG with 3/4 patent grafts, RCA is stented in the mid and distal segments, and there were three areas of severe restenosis in the mid and distal RCA and a chronically occluded prox LAD with patent VG. He underwent successful PTCA with angiosculpt cutting balloon in the mid and distal RCA. He was continued on DAPT with ASA + Plavix, BB and a satin.   Today in follow-up, he reports that he has done well. He denies any recurrent chest discomfort. No further exertional dyspnea. He has increased exercise tolerance and no more fatigue. He has been fully compliant with his medications including dual antiplatelet therapy with aspirin and Plavix. He denies any abnormal bleeding. He not required any use of sublingual nitroglycerin. His left radial cath site is stable without complication. EKG shows normal sinus rhythm without ischemia. Heart rate is stable at 69 bpm. Blood pressure is well-controlled at 118/80.   Current Outpatient Prescriptions  Medication Sig Dispense Refill  . aspirin 81 MG tablet Take 1 tablet (81 mg total) by mouth daily.    Marland Kitchen atorvastatin (LIPITOR) 40 MG tablet Take 1 tablet by mouth  daily 90 tablet 3  . clopidogrel (PLAVIX) 75 MG tablet Take 1 tablet by mouth   daily 90 tablet 3  . Esomeprazole Magnesium (NEXIUM PO) Take 22.3 mg by mouth daily.    Marland Kitchen glimepiride (AMARYL) 4 MG tablet Take 4 mg by mouth daily with breakfast.    . hydrochlorothiazide (HYDRODIURIL) 25 MG tablet Take 1 tablet (25 mg total) by mouth daily. 90 tablet 3  . isosorbide mononitrate (IMDUR) 60 MG 24 hr tablet Take 1 tablet by mouth  daily 90 tablet 3  . levothyroxine (SYNTHROID, LEVOTHROID) 100 MCG tablet Take 100 mcg by mouth daily.    . metFORMIN (GLUCOPHAGE) 1000 MG tablet Take by mouth daily with breakfast.     . metoprolol tartrate (LOPRESSOR) 25 MG tablet Take 1 tablet by mouth two  times daily 180 tablet 3  . nitroGLYCERIN (NITROSTAT) 0.4 MG SL tablet Place 1 tablet (0.4 mg total) under the tongue every 5 (five) minutes as needed. For chest pain. 25 tablet 6  . valsartan (DIOVAN) 80 MG tablet Take 1 tablet by mouth  daily 90 tablet 3   No current facility-administered medications for this visit.    Allergies  Allergen Reactions  . Codeine     Makes pt Sick    Social History   Social History  . Marital Status: Married    Spouse Name: N/A  . Number of Children: N/A  . Years of Education: N/A   Occupational History  . Not on file.   Social History Main Topics  . Smoking status: Former Smoker    Quit date:  08/01/1975  . Smokeless tobacco: Not on file  . Alcohol Use: No  . Drug Use: No  . Sexual Activity: No   Other Topics Concern  . Not on file   Social History Narrative   Lives locally with wife.     Review of Systems: General: negative for chills, fever, night sweats or weight changes.  Cardiovascular: negative for chest pain, dyspnea on exertion, edema, orthopnea, palpitations, paroxysmal nocturnal dyspnea or shortness of breath Dermatological: negative for rash Respiratory: negative for cough or wheezing Urologic: negative for hematuria Abdominal: negative for nausea, vomiting, diarrhea, bright red blood per rectum, melena, or  hematemesis Neurologic: negative for visual changes, syncope, or dizziness All other systems reviewed and are otherwise negative except as noted above.    Blood pressure 118/80, pulse 69, height 5\' 4"  (1.626 m), weight 171 lb 12.8 oz (77.928 kg).  General appearance: alert, cooperative, moderately obese and elderly  Neck: no carotid bruit and no JVD Lungs: clear to auscultation bilaterally Heart: regular rate and rhythm, S1, S2 normal, no murmur, click, rub or gallop Extremities: no LEE Pulses: 2+ and symmetric Skin: warm and dry Neurologic: Grossly normal  EKG NSR w/o ischemia   ASSESSMENT AND PLAN:   1. CAD: s/p remote CABG. 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 He required stenting to the RCA with DES. He denies any recurrent CP. No dyspnea. Increased exercise tolerance. No further fatigue. EKG w/o ischemia. VSS. Continue DAPT with ASA + Plavix, BB, ARB, statin and LA Nitrate.   2. S/p LHC: left radial cath site is stable w/o complication.    3. HTN: BP is well controlled on current regimen.   3. HLD: continue statin therapy.   4. DM: per PCP.  PLAN  Keep f/u with Dr. Acie Fredrickson 02/2016.   Brittainy Simmons PA-C 12/23/2015 11:20 AM

## 2016-01-25 ENCOUNTER — Telehealth (HOSPITAL_COMMUNITY): Payer: Self-pay | Admitting: Cardiac Rehabilitation

## 2016-01-25 ENCOUNTER — Encounter (HOSPITAL_COMMUNITY)
Admission: RE | Admit: 2016-01-25 | Discharge: 2016-01-25 | Disposition: A | Discharge status: Home / Self Care | Payer: Medicare Other | Source: Ambulatory Visit | Attending: Cardiovascular Disease | Admitting: Cardiovascular Disease

## 2016-01-25 VITALS — BP 130/80 | HR 74 | Ht 64.0 in | Wt 171.1 lb

## 2016-01-25 DIAGNOSIS — Z9861 Coronary angioplasty status: Secondary | ICD-10-CM | POA: Diagnosis present

## 2016-01-25 NOTE — Telephone Encounter (Signed)
-----   Message from Thayer Headings, MD sent at 01/25/2016  1:48 PM EDT ----- Regarding: RE: cardiac rehab  Patient may participate in cardiac rehab.  ----- Message -----    From: Lowell Guitar, RN    Sent: 01/25/2016   9:12 AM      To: Thayer Headings, MD Subject: cardiac rehab                                  Dear Dr. Acie Fredrickson,  Pt is scheduled to begin cardiac rehab.  He reports he has used NTG SL x1 at home and has occasional angina that resolves with rest.  He reports this is not unusual for him and is not concerning to him.    Is this expected for him?  If so, does he have clearance to begin cardiac rehab activities?  Thank you,  Andi Hence, RN, BSN Cardiac Pulmonary Rehab

## 2016-01-25 NOTE — Progress Notes (Signed)
Cardiac Rehab Medication Review by a Pharmacist  Does the patient  feel that his/her medications are working for him/her?  yes  Has the patient been experiencing any side effects to the medications prescribed?  no  Does the patient measure his/her own blood pressure or blood glucose at home?  yes to both  Does the patient have any problems obtaining medications due to transportation or finances?   no  Understanding of regimen: good Understanding of indications: good Potential of compliance: excellent    Pharmacist comments: Pt was a pleasure to speak with today. When asked about his medication he was able to answer all of questions. I wa able to answer all of his medications.    Darl Pikes 01/25/2016 2:20 PM

## 2016-01-26 NOTE — Progress Notes (Addendum)
Cardiac Individual Treatment Plan  Patient Details  Name: Benjamin Harrison MRN: [id number] Date of Birth: 1935/05/07 Referring Provider:        CARDIAC REHAB PHASE II ORIENTATION from 01/25/2016 in [place]   Referring Provider  [name], Arnette Norris, MD      Initial Encounter Date:       CARDIAC REHAB PHASE II ORIENTATION from 01/25/2016 in [place]   Date  01/25/16   Referring Provider  [name], Arnette Norris, MD      Visit Diagnosis: S/P PTCA (percutaneous transluminal coronary angioplasty)  Patient's Home Medications on Admission:  Current outpatient prescriptions:  .  aspirin 81 MG tablet, Take 1 tablet (81 mg total) by mouth daily., Disp: , Rfl:  .  atorvastatin (LIPITOR) 40 MG tablet, Take 1 tablet by mouth  daily, Disp: 90 tablet, Rfl: 3 .  clopidogrel (PLAVIX) 75 MG tablet, Take 1 tablet by mouth  daily, Disp: 90 tablet, Rfl: 3 .  Esomeprazole Magnesium (NEXIUM PO), Take 22.3 mg by mouth daily., Disp: , Rfl:  .  glimepiride (AMARYL) 4 MG tablet, Take 4 mg by mouth 2 (two) times daily. , Disp: , Rfl:  .  hydrochlorothiazide (HYDRODIURIL) 25 MG tablet, Take 1 tablet (25 mg total) by mouth daily., Disp: 90 tablet, Rfl: 3 .  isosorbide mononitrate (IMDUR) 60 MG 24 hr tablet, Take 1 tablet by mouth  daily, Disp: 90 tablet, Rfl: 3 .  levothyroxine (SYNTHROID, LEVOTHROID) 100 MCG tablet, Take 100 mcg by mouth daily., Disp: , Rfl:  .  metFORMIN (GLUCOPHAGE) 1000 MG tablet, Take by mouth daily with breakfast. Pt takes 1000mg  in the morning and 500mg  in the evening, Disp: , Rfl:  .  metFORMIN (GLUCOPHAGE) 500 MG tablet, Take 500 mg by mouth every evening. Pt takes 1000mg  in the morning and 500mg  in the evening, Disp: , Rfl:  .  metoprolol tartrate (LOPRESSOR) 25 MG tablet, Take 1 tablet by mouth two  times daily, Disp: 180 tablet, Rfl: 3 .  nitroGLYCERIN (NITROSTAT) 0.4 MG SL tablet, Place 1 tablet (0.4 mg total) under the tongue  every 5 (five) minutes as needed. For chest pain., Disp: 25 tablet, Rfl: 6 .  valsartan (DIOVAN) 80 MG tablet, Take 1 tablet by mouth  daily, Disp: 90 tablet, Rfl: 3  Past Medical History: Past Medical History  Diagnosis Date  . Coronary artery disease     a. CABG 2001 with early failure of VG-RCA. b. Inf MI after Lexiscan 01/28/2009 s/p RCA stent. c. Stent to dRCA 02/24/09. d. DES to [place]  2012. e. NSTEMI s/p angioplasty for ISR of RCA 09/2011. f. 05/2012 Cath: LM 20-30p, LAD 100p, LCX min irregs, RCA patent prox stent, 52m ISR, VG->Diag nl w/ dzs in D1 prox to graft, VG->RCA 100, VG->OM nl, LIMA->LAD nl, Nl EF->Med Rx; g. 09/2014 low-risk MV.  Marland Kitchen Hypertensive heart disease   . Hypothyroidism   . Hyperlipidemia   . Personal history of tobacco use   . Type II diabetes mellitus ([place])   . Osteoarthritis     a.End-stage osteoarthritis, right knee, medial compartment  . History of echocardiogram     a. 09/2014 Echo: EF 55-60%, mild basal-midinferior HK, triv AI.  Marland Kitchen CHF (congestive heart failure) ([place])   . Hypertension     Tobacco Use: History  Smoking status  . Former Smoker  . Quit date: 08/01/1975  Smokeless tobacco  . Not on file    Labs: Recent  Review Flowsheet Data    Labs for ITP Cardiac and Pulmonary Rehab Latest Ref Rng 11/03/2013 06/30/2014 12/04/2014 08/30/2015 12/05/2015   Cholestrol 125 - 200 mg/dL - 146 119 79(L) -   LDLCALC <130 mg/dL - 86 69 44 -   HDL >=40 mg/dL - 27.80(L) 30.50(L) 25(L) -   Trlycerides <150 mg/dL - 160.0(H) 96.0 49 -   Hemoglobin A1c 4.8 - 5.6 % - - - - 6.5(H)   TCO2 0 - 100 mmol/L 26 - - - -      Capillary Blood Glucose: Lab Results  Component Value Date   GLUCAP 148* 12/07/2015   GLUCAP 132* 12/07/2015   GLUCAP 99 12/06/2015   GLUCAP 74 12/06/2015   GLUCAP 60* 12/06/2015     Exercise Target Goals: Date: 01/25/16  Exercise Program Goal: Individual exercise prescription set with THRR, safety & activity barriers. Participant demonstrates  ability to understand and report RPE using BORG scale, to self-measure pulse accurately, and to acknowledge the importance of the exercise prescription.  Exercise Prescription Goal: Starting with aerobic activity 30 plus minutes a day, 3 days per week for initial exercise prescription. Provide home exercise prescription and guidelines that participant acknowledges understanding prior to discharge.  Activity Barriers & Risk Stratification:     Activity Barriers & Cardiac Risk Stratification - 01/25/16 1454    Activity Barriers & Cardiac Risk Stratification   Activity Barriers Right Knee Replacement;Other (comment)   Comments Lt knee arthroscopy   Cardiac Risk Stratification High      6 Minute Walk:     6 Minute Walk      01/25/16 1644       6 Minute Walk   Phase Initial     Distance 1325 feet     Walk Time 6 minutes     # of Rest Breaks 0     MPH 2.51     METS 2.09     RPE 13     VO2 Peak 7.32     Symptoms Yes (comment)     Comments c/o right knee stiffness     Resting HR 74 bpm     Resting BP 130/80 mmHg     Max Ex. HR 86 bpm     Max Ex. BP 120/80 mmHg     2 Minute Post BP 138/68 mmHg        Initial Exercise Prescription:     Initial Exercise Prescription - 01/26/16 0800    Date of Initial Exercise RX and Referring Provider   Date 01/25/16   Referring Provider [name], Arnette Norris, MD   Bike   Level 0.4   Minutes 10   METs 1.98   NuStep   Level 1   Minutes 10   METs 2   Track   Laps 8   Minutes 10   METs 2.39   Prescription Details   Frequency (times per week) 3   Duration Progress to 30 minutes of continuous aerobic without signs/symptoms of physical distress   Intensity   THRR 40-80% of Max Heartrate 57-114   Ratings of Perceived Exertion 11-13   Perceived Dyspnea 0-4   Progression   Progression Continue to progress workloads to maintain intensity without signs/symptoms of physical distress.   Resistance Training   Training Prescription Yes   Weight  1-2 lbs   Reps 10-12      Perform Capillary Blood Glucose checks as needed.  Exercise Prescription Changes:   Exercise Comments:   Discharge Exercise Prescription (Final  Exercise Prescription Changes):   Nutrition:  Target Goals: Understanding of nutrition guidelines, daily intake of sodium 1500mg , cholesterol 200mg , calories 30% from fat and 7% or less from saturated fats, daily to have 5 or more servings of fruits and vegetables.  Biometrics:     Pre Biometrics - 01/25/16 1643    Pre Biometrics   Height 5\' 4"  (1.626 m)   Weight 171 lb 1.2 oz (77.6 kg)   Waist Circumference 40 inches   Hip Circumference 38.25 inches   Waist to Hip Ratio 1.05 %   BMI (Calculated) 29.4   Triceps Skinfold 13 mm   % Body Fat 28.5 %   Grip Strength 34.5 kg   Flexibility 11 in   Single Leg Stand 3.75 seconds       Nutrition Therapy Plan and Nutrition Goals:   Nutrition Discharge: Nutrition Scores:   Nutrition Goals Re-Evaluation:   Psychosocial: Target Goals: Acknowledge presence or absence of depression, maximize coping skills, provide positive support system. Participant is able to verbalize types and ability to use techniques and skills needed for reducing stress and depression.  Initial Review & Psychosocial Screening:   Quality of Life Scores:     Quality of Life - 01/26/16 0824    Quality of Life Scores   Health/Function Pre 23.46 %   Socioeconomic Pre 28.33 %   Psych/Spiritual Pre 27.5 %   Family Pre 24 %   GLOBAL Pre 25.34 %      PHQ-9:     Recent Review Flowsheet Data    There is no flowsheet data to display.      Psychosocial Evaluation and Intervention:   Psychosocial Re-Evaluation:   Vocational Rehabilitation: Provide vocational rehab assistance to qualifying candidates.   Vocational Rehab Evaluation & Intervention:     Vocational Rehab - 01/25/16 1431    Initial Vocational Rehab Evaluation & Intervention   Assessment shows need for  Vocational Rehabilitation No      Education: Education Goals: Education classes will be provided on a weekly basis, covering required topics. Participant will state understanding/return demonstration of topics presented.  Learning Barriers/Preferences:     Learning Barriers/Preferences - 01/25/16 1431    Learning Barriers/Preferences   Learning Barriers Sight;Hearing   Learning Preferences Verbal Instruction;Written Material      Education Topics: Count Your Pulse:  -Group instruction provided by verbal instruction, demonstration, patient participation and written materials to support subject.  Instructors address importance of being able to find your pulse and how to count your pulse when at home without a heart monitor.  Patients get hands on experience counting their pulse with staff help and individually.   Heart Attack, Angina, and Risk Factor Modification:  -Group instruction provided by verbal instruction, video, and written materials to support subject.  Instructors address signs and symptoms of angina and heart attacks.    Also discuss risk factors for heart disease and how to make changes to improve heart health risk factors.   Functional Fitness:  -Group instruction provided by verbal instruction, demonstration, patient participation, and written materials to support subject.  Instructors address safety measures for doing things around the house.  Discuss how to get up and down off the floor, how to pick things up properly, how to safely get out of a chair without assistance, and balance training.   Meditation and Mindfulness:  -Group instruction provided by verbal instruction, patient participation, and written materials to support subject.  Instructor addresses importance of mindfulness and meditation practice to help  reduce stress and improve awareness.  Instructor also leads participants through a meditation exercise.    Stretching for Flexibility and Mobility:   -Group instruction provided by verbal instruction, patient participation, and written materials to support subject.  Instructors lead participants through series of stretches that are designed to increase flexibility thus improving mobility.  These stretches are additional exercise for major muscle groups that are typically performed during regular warm up and cool down.   Hands Only CPR Anytime:  -Group instruction provided by verbal instruction, video, patient participation and written materials to support subject.  Instructors co-teach with AHA video for hands only CPR.  Participants get hands on experience with mannequins.   Nutrition I class: Heart Healthy Eating:  -Group instruction provided by PowerPoint slides, verbal discussion, and written materials to support subject matter. The instructor gives an explanation and review of the Therapeutic Lifestyle Changes diet recommendations, which includes a discussion on lipid goals, dietary fat, sodium, fiber, plant stanol/sterol esters, sugar, and the components of a well-balanced, healthy diet.   Nutrition II class: Lifestyle Skills:  -Group instruction provided by PowerPoint slides, verbal discussion, and written materials to support subject matter. The instructor gives an explanation and review of label reading, grocery shopping for heart health, heart healthy recipe modifications, and ways to make healthier choices when eating out.   Diabetes Question & Answer:  -Group instruction provided by PowerPoint slides, verbal discussion, and written materials to support subject matter. The instructor gives an explanation and review of diabetes co-morbidities, pre- and post-prandial blood glucose goals, pre-exercise blood glucose goals, signs, symptoms, and treatment of hypoglycemia and hyperglycemia, and foot care basics.   Diabetes Blitz:  -Group instruction provided by PowerPoint slides, verbal discussion, and written materials to support  subject matter. The instructor gives an explanation and review of the physiology behind type 1 and type 2 diabetes, diabetes medications and rational behind using different medications, pre- and post-prandial blood glucose recommendations and Hemoglobin A1c goals, diabetes diet, and exercise including blood glucose guidelines for exercising safely.    Portion Distortion:  -Group instruction provided by PowerPoint slides, verbal discussion, written materials, and food models to support subject matter. The instructor gives an explanation of serving size versus portion size, changes in portions sizes over the last 20 years, and what consists of a serving from each food group.   Stress Management:  -Group instruction provided by verbal instruction, video, and written materials to support subject matter.  Instructors review role of stress in heart disease and how to cope with stress positively.     Exercising on Your Own:  -Group instruction provided by verbal instruction, power point, and written materials to support subject.  Instructors discuss benefits of exercise, components of exercise, frequency and intensity of exercise, and end points for exercise.  Also discuss use of nitroglycerin and activating EMS.  Review options of places to exercise outside of rehab.  Review guidelines for sex with heart disease.   Cardiac Drugs I:  -Group instruction provided by verbal instruction and written materials to support subject.  Instructor reviews cardiac drug classes: antiplatelets, anticoagulants, beta blockers, and statins.  Instructor discusses reasons, side effects, and lifestyle considerations for each drug class.   Cardiac Drugs II:  -Group instruction provided by verbal instruction and written materials to support subject.  Instructor reviews cardiac drug classes: angiotensin converting enzyme inhibitors (ACE-I), angiotensin II receptor blockers (ARBs), nitrates, and calcium channel blockers.   Instructor discusses reasons, side effects, and lifestyle considerations for each  drug class.   Anatomy and Physiology of the Circulatory System:  -Group instruction provided by verbal instruction, video, and written materials to support subject.  Reviews functional anatomy of heart, how it relates to various diagnoses, and what role the heart plays in the overall system.   Knowledge Questionnaire Score:     Knowledge Questionnaire Score - 01/26/16 0824    Knowledge Questionnaire Score   Pre Score 22/24      Core Components/Risk Factors/Patient Goals at Admission:     Personal Goals and Risk Factors at Admission - 01/25/16 1455    Core Components/Risk Factors/Patient Goals on Admission   Admit Weight 171 lb 1.2 oz (77.6 kg)   Goal Weight: Short Term 165 lb (74.844 kg)   Goal Weight: Long Term 150 lb (68.04 kg)   Personal Goal Other Yes   Personal Goal Be able to walk/ get around better.   Intervention Provide individualized exercise guidelines to improve mobility and endurance.   Expected Outcomes Maintain exercise program at home including regular walk as part of exercise plan.      Core Components/Risk Factors/Patient Goals Review:    Core Components/Risk Factors/Patient Goals at Discharge (Final Review):    ITP Comments:     ITP Comments      01/25/16 1351           ITP Comments Medical Director - Dr. Fransico Him          Comments: Patient attended orientation from 1330 to 1530 to review rules and guidelines for program. Completed 6 minute walk test, Intitial ITP, and exercise prescription.  VSS. Telemetry-sinus rhythm, occasional PVC.    Asymptomatic.

## 2016-02-02 ENCOUNTER — Encounter (HOSPITAL_COMMUNITY)
Admission: RE | Admit: 2016-02-02 | Discharge: 2016-02-02 | Disposition: A | Discharge status: Home / Self Care | Payer: Medicare Other | Source: Ambulatory Visit | Attending: Cardiovascular Disease | Admitting: Cardiovascular Disease

## 2016-02-02 DIAGNOSIS — Z9861 Coronary angioplasty status: Secondary | ICD-10-CM | POA: Insufficient documentation

## 2016-02-02 LAB — GLUCOSE, CAPILLARY
Glucose-Capillary: 138 mg/dL — ABNORMAL HIGH (ref 65–99)
Glucose-Capillary: 104 mg/dL — ABNORMAL HIGH (ref 65–99)

## 2016-02-02 NOTE — Progress Notes (Signed)
Daily Session Note  Patient Details  Name: Benjamin Harrison MRN: [id number] Date of Birth: June 14, 1935 Referring Provider:        CARDIAC REHAB PHASE II ORIENTATION from 01/25/2016 in [place]   Referring Provider  Mertie Moores, MD      Encounter Date: 02/02/2016  Check In:     Session Check In - 02/02/16 1201    Check-In   Location [facility]   Staff Present Andi Hence, RN, BSN;Amber Fair, MS, ACSM RCEP, Exercise Physiologist;Lizeth Bencosme Wilber Oliphant, RN, Deland Pretty, MS, ACSM CEP, Exercise Physiologist   Supervising physician immediately available to respond to emergencies [facility] immediately available   Physician(s) Dr. Eulas Post    Medication changes reported     No   Fall or balance concerns reported    No   Warm-up and Cool-down Performed as group-led instruction   Resistance Training Performed No   VAD Patient? No   Pain Assessment   Currently in Pain? No/denies      Capillary Blood Glucose: No results found for this or any previous visit (from the past 24 hour(s)).   Goals Met:  Exercise tolerated well Personal goals reviewed  Goals Unmet:  Not Applicable  Comments:  Pt started full exercise at Phase II cardiac rehab program.  Pt tolerated light exercise without difficulty. VSS, telemetry-SR, asymptomatic.  Medication list reconciled. Pt denies barriers to medicaiton compliance.  PSYCHOSOCIAL ASSESSMENT:  PHQ-0. Pt exhibits positive coping skills, hopeful outlook with supportive family. No psychosocial needs identified at this time, no psychosocial interventions necessary. Pt desires loses weight, be able to walk and get around bette, breath better - less shortness of breath.  Pt long term goal weight is 145-150 pounds   Pt oriented to exercise equipment and routine.    Understanding verbalized.Maurice Small RN, BSN     Dr. Fransico Him is Medical Director for Cardiac Rehab at [facility].

## 2016-02-04 ENCOUNTER — Encounter (HOSPITAL_COMMUNITY)
Admission: RE | Admit: 2016-02-04 | Discharge: 2016-02-04 | Disposition: A | Discharge status: Home / Self Care | Payer: Medicare Other | Source: Ambulatory Visit | Attending: Cardiovascular Disease | Admitting: Cardiovascular Disease

## 2016-02-04 DIAGNOSIS — Z9861 Coronary angioplasty status: Secondary | ICD-10-CM

## 2016-02-04 LAB — GLUCOSE, CAPILLARY
GLUCOSE-CAPILLARY: 96 mg/dL (ref 65–99)
Glucose-Capillary: 107 mg/dL — ABNORMAL HIGH (ref 65–99)

## 2016-02-04 NOTE — Progress Notes (Signed)
Daily Session Note  Patient Details  Name: KERRINGTON GREENHALGH MRN: [id number] Date of Birth: 01/19/1935 Referring Provider:        CARDIAC REHAB PHASE II ORIENTATION from 01/25/2016 in [place]   Referring Provider  Mertie Moores, MD      Encounter Date: 02/04/2016  Check In:     Session Check In - 02/04/16 1113    Check-In   Location [facility]   Staff Present Andi Hence, RN, BSN;Amber Fair, MS, ACSM RCEP, Exercise Physiologist;Carlette Wilber Oliphant, RN, Deland Pretty, MS, ACSM CEP, Exercise Physiologist   Supervising physician immediately available to respond to emergencies [facility] immediately available   Physician(s) Dr. Marthenia Rolling   Medication changes reported     No   Fall or balance concerns reported    No   Warm-up and Cool-down Performed as group-led instruction   Resistance Training Performed Yes   VAD Patient? No   Pain Assessment   Currently in Pain? No/denies   Multiple Pain Sites No      Capillary Blood Glucose: Results for orders placed or performed during the hospital encounter of 02/04/16 (from the past 24 hour(s))  Glucose, capillary     Status: Abnormal   Collection Time: 02/04/16 11:30 AM  Result Value Ref Range   Glucose-Capillary 107 (H) 65 - 99 mg/dL  Glucose, capillary     Status: None   Collection Time: 02/04/16 12:24 PM  Result Value Ref Range   Glucose-Capillary 96 65 - 99 mg/dL     Goals Met:  Exercise tolerated well  Goals Unmet:  Not Applicable  Comments:  QUALITY OF LIFE SCORE REVIEW  Pt completed Quality of Life survey as a participant in Cardiac Rehab. Scores 21.0 or below are considered low. Pt scored the following     Quality of Life - 01/26/16 0824    Quality of Life Scores   Health/Function Pre 23.46 %   Socioeconomic Pre 28.33 %   Psych/Spiritual Pre 27.5 %   Family Pre 24 %   GLOBAL Pre 25.34 %     Pt  scored well above the low threshold.  Pt demonstrates  positive and heathy coping skills with good support systems in place. Pt denies any psychosocial needs at this time.   Will continue to monitor and intervene as necessary. Maurice Small RN, BSN        Dr. Fransico Him is Medical Director for Cardiac Rehab at [facility].

## 2016-02-07 ENCOUNTER — Encounter (HOSPITAL_COMMUNITY)
Admission: RE | Admit: 2016-02-07 | Discharge: 2016-02-07 | Disposition: A | Discharge status: Home / Self Care | Payer: Medicare Other | Source: Ambulatory Visit | Attending: Cardiovascular Disease | Admitting: Cardiovascular Disease

## 2016-02-07 DIAGNOSIS — Z9861 Coronary angioplasty status: Secondary | ICD-10-CM | POA: Diagnosis not present

## 2016-02-07 LAB — GLUCOSE, CAPILLARY
GLUCOSE-CAPILLARY: 142 mg/dL — AB (ref 65–99)
GLUCOSE-CAPILLARY: 151 mg/dL — AB (ref 65–99)

## 2016-02-09 ENCOUNTER — Encounter (HOSPITAL_COMMUNITY)
Admission: RE | Admit: 2016-02-09 | Discharge: 2016-02-09 | Disposition: A | Discharge status: Home / Self Care | Payer: Medicare Other | Source: Ambulatory Visit | Attending: Cardiovascular Disease | Admitting: Cardiovascular Disease

## 2016-02-09 DIAGNOSIS — Z9861 Coronary angioplasty status: Secondary | ICD-10-CM

## 2016-02-11 ENCOUNTER — Encounter (HOSPITAL_COMMUNITY)
Admission: RE | Admit: 2016-02-11 | Discharge: 2016-02-11 | Disposition: A | Discharge status: Home / Self Care | Payer: Medicare Other | Source: Ambulatory Visit | Attending: Cardiovascular Disease | Admitting: Cardiovascular Disease

## 2016-02-11 DIAGNOSIS — Z9861 Coronary angioplasty status: Secondary | ICD-10-CM

## 2016-02-16 ENCOUNTER — Encounter (HOSPITAL_COMMUNITY): Payer: Medicare Other

## 2016-02-16 ENCOUNTER — Telehealth (HOSPITAL_COMMUNITY): Payer: Self-pay | Admitting: Family Medicine

## 2016-02-17 ENCOUNTER — Encounter (HOSPITAL_COMMUNITY): Payer: Self-pay | Admitting: *Deleted

## 2016-02-17 DIAGNOSIS — Z9861 Coronary angioplasty status: Secondary | ICD-10-CM

## 2016-02-17 NOTE — Progress Notes (Signed)
Cardiac Individual Treatment Plan  Patient Details  Name: Benjamin Harrison MRN: [id number] Date of Birth: 1934/08/03 Referring Provider:        CARDIAC REHAB PHASE II ORIENTATION from 01/25/2016 in [facility]   Referring Provider  [name], Arnette Norris, MD      Initial Encounter Date:       CARDIAC REHAB PHASE II ORIENTATION from 01/25/2016 in [facility]   Date  01/25/16   Referring Provider  [name], Arnette Norris, MD      Visit Diagnosis: S/P PTCA (percutaneous transluminal coronary angioplasty)  Patient's Home Medications on Admission:  Current outpatient prescriptions:  .  aspirin 81 MG tablet, Take 1 tablet (81 mg total) by mouth daily., Disp: , Rfl:  .  atorvastatin (LIPITOR) 40 MG tablet, Take 1 tablet by mouth  daily, Disp: 90 tablet, Rfl: 3 .  clopidogrel (PLAVIX) 75 MG tablet, Take 1 tablet by mouth  daily, Disp: 90 tablet, Rfl: 3 .  Esomeprazole Magnesium (NEXIUM PO), Take 22.3 mg by mouth daily., Disp: , Rfl:  .  glimepiride (AMARYL) 4 MG tablet, Take 4 mg by mouth 2 (two) times daily. , Disp: , Rfl:  .  hydrochlorothiazide (HYDRODIURIL) 25 MG tablet, Take 1 tablet (25 mg total) by mouth daily., Disp: 90 tablet, Rfl: 3 .  isosorbide mononitrate (IMDUR) 60 MG 24 hr tablet, Take 1 tablet by mouth  daily, Disp: 90 tablet, Rfl: 3 .  levothyroxine (SYNTHROID, LEVOTHROID) 100 MCG tablet, Take 100 mcg by mouth daily., Disp: , Rfl:  .  metFORMIN (GLUCOPHAGE) 1000 MG tablet, Take by mouth daily with breakfast. Pt takes 1000mg  in the morning and 500mg  in the evening, Disp: , Rfl:  .  metFORMIN (GLUCOPHAGE) 500 MG tablet, Take 500 mg by mouth every evening. Pt takes 1000mg  in the morning and 500mg  in the evening, Disp: , Rfl:  .  metoprolol tartrate (LOPRESSOR) 25 MG tablet, Take 1 tablet by mouth two  times daily, Disp: 180 tablet, Rfl: 3 .  nitroGLYCERIN (NITROSTAT) 0.4 MG SL tablet, Place 1 tablet (0.4 mg total) under the tongue  every 5 (five) minutes as needed. For chest pain., Disp: 25 tablet, Rfl: 6 .  valsartan (DIOVAN) 80 MG tablet, Take 1 tablet by mouth  daily, Disp: 90 tablet, Rfl: 3  Past Medical History: Past Medical History  Diagnosis Date  . Coronary artery disease     a. CABG 2001 with early failure of VG-RCA. b. Inf MI after Lexiscan 01/28/2009 s/p RCA stent. c. Stent to dRCA 02/24/09. d. DES to [place]  2012. e. NSTEMI s/p angioplasty for ISR of RCA 09/2011. f. 05/2012 Cath: LM 20-30p, LAD 100p, LCX min irregs, RCA patent prox stent, 17m ISR, VG->Diag nl w/ dzs in D1 prox to graft, VG->RCA 100, VG->OM nl, LIMA->LAD nl, Nl EF->Med Rx; g. 09/2014 low-risk MV.  Marland Kitchen Hypertensive heart disease   . Hypothyroidism   . Hyperlipidemia   . Personal history of tobacco use   . Type II diabetes mellitus ([place])   . Osteoarthritis     a.End-stage osteoarthritis, right knee, medial compartment  . History of echocardiogram     a. 09/2014 Echo: EF 55-60%, mild basal-midinferior HK, triv AI.  Marland Kitchen CHF (congestive heart failure) ([place])   . Hypertension     Tobacco Use: History  Smoking status  . Former Smoker  . Quit date: 08/01/1975  Smokeless tobacco  . Not on file    Labs: Recent  Review Flowsheet Data    Labs for ITP Cardiac and Pulmonary Rehab Latest Ref Rng 11/03/2013 06/30/2014 12/04/2014 08/30/2015 12/05/2015   Cholestrol 125 - 200 mg/dL - 146 119 79(L) -   LDLCALC <130 mg/dL - 86 69 44 -   HDL >=40 mg/dL - 27.80(L) 30.50(L) 25(L) -   Trlycerides <150 mg/dL - 160.0(H) 96.0 49 -   Hemoglobin A1c 4.8 - 5.6 % - - - - 6.5(H)   TCO2 0 - 100 mmol/L 26 - - - -      Capillary Blood Glucose: Lab Results  Component Value Date   GLUCAP 151* 02/07/2016   GLUCAP 142* 02/07/2016   GLUCAP 96 02/04/2016   GLUCAP 107* 02/04/2016   GLUCAP 104* 02/02/2016     Exercise Target Goals:    Exercise Program Goal: Individual exercise prescription set with THRR, safety & activity barriers. Participant demonstrates ability to  understand and report RPE using BORG scale, to self-measure pulse accurately, and to acknowledge the importance of the exercise prescription.  Exercise Prescription Goal: Starting with aerobic activity 30 plus minutes a day, 3 days per week for initial exercise prescription. Provide home exercise prescription and guidelines that participant acknowledges understanding prior to discharge.  Activity Barriers & Risk Stratification:     Activity Barriers & Cardiac Risk Stratification - 01/25/16 1454    Activity Barriers & Cardiac Risk Stratification   Activity Barriers Right Knee Replacement;Other (comment)   Comments Lt knee arthroscopy   Cardiac Risk Stratification High      6 Minute Walk:     6 Minute Walk      01/25/16 1644       6 Minute Walk   Phase Initial     Distance 1325 feet     Walk Time 6 minutes     # of Rest Breaks 0     MPH 2.51     METS 2.09     RPE 13     VO2 Peak 7.32     Symptoms Yes (comment)     Comments c/o right knee stiffness     Resting HR 74 bpm     Resting BP 130/80 mmHg     Max Ex. HR 86 bpm     Max Ex. BP 120/80 mmHg     2 Minute Post BP 138/68 mmHg        Initial Exercise Prescription:     Initial Exercise Prescription - 01/26/16 0800    Date of Initial Exercise RX and Referring Provider   Date 01/25/16   Referring Provider [name], Arnette Norris, MD   Bike   Level 0.4   Minutes 10   METs 1.98   NuStep   Level 1   Minutes 10   METs 2   Track   Laps 8   Minutes 10   METs 2.39   Prescription Details   Frequency (times per week) 3   Duration Progress to 30 minutes of continuous aerobic without signs/symptoms of physical distress   Intensity   THRR 40-80% of Max Heartrate 57-114   Ratings of Perceived Exertion 11-13   Perceived Dyspnea 0-4   Progression   Progression Continue to progress workloads to maintain intensity without signs/symptoms of physical distress.   Resistance Training   Training Prescription Yes   Weight 1-2 lbs    Reps 10-12      Perform Capillary Blood Glucose checks as needed.  Exercise Prescription Changes:     Exercise Prescription Changes  02/16/16 1700           Exercise Review   Progression Yes       Response to Exercise   Blood Pressure (Admit) 122/80 mmHg       Blood Pressure (Exercise) 120/70 mmHg       Blood Pressure (Exit) 120/80 mmHg       Heart Rate (Admit) 65 bpm       Heart Rate (Exercise) 89 bpm       Heart Rate (Exit) 65 bpm       Rating of Perceived Exertion (Exercise) 10       Symptoms none       Duration Progress to 30 minutes of continuous aerobic without signs/symptoms of physical distress       Intensity THRR unchanged       Progression   Progression Continue to progress workloads to maintain intensity without signs/symptoms of physical distress.       Average METs 2.4       Resistance Training   Training Prescription Yes       Weight 2lbs       Reps 10-12       Interval Training   Interval Training No       Bike   Level 0.4       Minutes 10       METs 1.98       NuStep   Level 3       Minutes 10       METs 2.4       Arm Ergometer   Level 1.5       Minutes 10       Track   Laps 10       Minutes 10       METs 2.74          Exercise Comments:     Exercise Comments      02/16/16 1719           Exercise Comments Off to a good start with exercise.          Discharge Exercise Prescription (Final Exercise Prescription Changes):     Exercise Prescription Changes - 02/16/16 1700    Exercise Review   Progression Yes   Response to Exercise   Blood Pressure (Admit) 122/80 mmHg   Blood Pressure (Exercise) 120/70 mmHg   Blood Pressure (Exit) 120/80 mmHg   Heart Rate (Admit) 65 bpm   Heart Rate (Exercise) 89 bpm   Heart Rate (Exit) 65 bpm   Rating of Perceived Exertion (Exercise) 10   Symptoms none   Duration Progress to 30 minutes of continuous aerobic without signs/symptoms of physical distress   Intensity THRR unchanged    Progression   Progression Continue to progress workloads to maintain intensity without signs/symptoms of physical distress.   Average METs 2.4   Resistance Training   Training Prescription Yes   Weight 2lbs   Reps 10-12   Interval Training   Interval Training No   Bike   Level 0.4   Minutes 10   METs 1.98   NuStep   Level 3   Minutes 10   METs 2.4   Arm Ergometer   Level 1.5   Minutes 10   Track   Laps 10   Minutes 10   METs 2.74      Nutrition:  Target Goals: Understanding of nutrition guidelines, daily intake of sodium 1500mg , cholesterol 200mg , calories 30% from fat and  7% or less from saturated fats, daily to have 5 or more servings of fruits and vegetables.  Biometrics:     Pre Biometrics - 01/25/16 1643    Pre Biometrics   Height 5\' 4"  (1.626 m)   Weight 171 lb 1.2 oz (77.6 kg)   Waist Circumference 40 inches   Hip Circumference 38.25 inches   Waist to Hip Ratio 1.05 %   BMI (Calculated) 29.4   Triceps Skinfold 13 mm   % Body Fat 28.5 %   Grip Strength 34.5 kg   Flexibility 11 in   Single Leg Stand 3.75 seconds       Nutrition Therapy Plan and Nutrition Goals:     Nutrition Therapy & Goals - 02/07/16 1409    Nutrition Therapy   Diet Carb Modified, Therapeutic Lifestyle Changes   Personal Nutrition Goals   Personal Goal #1 1-2 lb wt loss per week to a goal wt loss of 6-24 lb at graduation from [facility], educate and counsel regarding individualized specific dietary modifications aiming towards targeted core components such as weight, hypertension, lipid management, diabetes, heart failure and other comorbidities.   Expected Outcomes Short Term Goal: Understand basic principles of dietary content, such as calories, fat, sodium, cholesterol and nutrients.;Long Term Goal: Adherence to prescribed nutrition plan.      Nutrition Discharge: Nutrition Scores:     Nutrition Assessments - 02/07/16 1525     MEDFICTS Scores   Pre Score 71      Nutrition Goals Re-Evaluation:   Psychosocial: Target Goals: Acknowledge presence or absence of depression, maximize coping skills, provide positive support system. Participant is able to verbalize types and ability to use techniques and skills needed for reducing stress and depression.  Initial Review & Psychosocial Screening:   Quality of Life Scores:     Quality of Life - 01/26/16 0824    Quality of Life Scores   Health/Function Pre 23.46 %   Socioeconomic Pre 28.33 %   Psych/Spiritual Pre 27.5 %   Family Pre 24 %   GLOBAL Pre 25.34 %      PHQ-9:     Recent Review Flowsheet Data    Depression screen [facility] 2/9 02/02/2016   Decreased Interest 0   PHQ - 2 Score 0      Psychosocial Evaluation and Intervention:   Psychosocial Re-Evaluation:   Vocational Rehabilitation: Provide vocational rehab assistance to qualifying candidates.   Vocational Rehab Evaluation & Intervention:     Vocational Rehab - 01/25/16 1431    Initial Vocational Rehab Evaluation & Intervention   Assessment shows need for Vocational Rehabilitation No      Education: Education Goals: Education classes will be provided on a weekly basis, covering required topics. Participant will state understanding/return demonstration of topics presented.  Learning Barriers/Preferences:     Learning Barriers/Preferences - 01/25/16 1431    Learning Barriers/Preferences   Learning Barriers Sight;Hearing   Learning Preferences Verbal Instruction;Written Material      Education Topics: Count Your Pulse:  -Group instruction provided by verbal instruction, demonstration, patient participation and written materials to support subject.  Instructors address importance of being able to find your pulse and how to count your pulse when at home without a heart monitor.  Patients get hands on experience counting their pulse with staff help and individually.           CARDIAC REHAB PHASE II EXERCISE from 02/09/2016 in [facility]  [facility]   Date  02/04/16   Instruction Review Code  2- meets goals/outcomes      Heart Attack, Angina, and Risk Factor Modification:  -Group instruction provided by verbal instruction, video, and written materials to support subject.  Instructors address signs and symptoms of angina and heart attacks.    Also discuss risk factors for heart disease and how to make changes to improve heart health risk factors.   Functional Fitness:  -Group instruction provided by verbal instruction, demonstration, patient participation, and written materials to support subject.  Instructors address safety measures for doing things around the house.  Discuss how to get up and down off the floor, how to pick things up properly, how to safely get out of a chair without assistance, and balance training.   Meditation and Mindfulness:  -Group instruction provided by verbal instruction, patient participation, and written materials to support subject.  Instructor addresses importance of mindfulness and meditation practice to help reduce stress and improve awareness.  Instructor also leads participants through a meditation exercise.       CARDIAC REHAB PHASE II EXERCISE from 02/09/2016 in [facility]   Date  02/02/16   Instruction Review Code  2- meets goals/outcomes      Stretching for Flexibility and Mobility:  -Group instruction provided by verbal instruction, patient participation, and written materials to support subject.  Instructors lead participants through series of stretches that are designed to increase flexibility thus improving mobility.  These stretches are additional exercise for major muscle groups that are typically performed during regular warm up and cool down.   Hands Only CPR Anytime:  -Group instruction provided by verbal instruction, video, patient participation and written  materials to support subject.  Instructors co-teach with AHA video for hands only CPR.  Participants get hands on experience with mannequins.   Nutrition I class: Heart Healthy Eating:  -Group instruction provided by PowerPoint slides, verbal discussion, and written materials to support subject matter. The instructor gives an explanation and review of the Therapeutic Lifestyle Changes diet recommendations, which includes a discussion on lipid goals, dietary fat, sodium, fiber, plant stanol/sterol esters, sugar, and the components of a well-balanced, healthy diet.      CARDIAC REHAB PHASE II EXERCISE from 02/09/2016 in [facility]   Date  02/08/16   Educator  RD   Instruction Review Code  2- meets goals/outcomes      Nutrition II class: Lifestyle Skills:  -Group instruction provided by PowerPoint slides, verbal discussion, and written materials to support subject matter. The instructor gives an explanation and review of label reading, grocery shopping for heart health, heart healthy recipe modifications, and ways to make healthier choices when eating out.   Diabetes Question & Answer:  -Group instruction provided by PowerPoint slides, verbal discussion, and written materials to support subject matter. The instructor gives an explanation and review of diabetes co-morbidities, pre- and post-prandial blood glucose goals, pre-exercise blood glucose goals, signs, symptoms, and treatment of hypoglycemia and hyperglycemia, and foot care basics.   Diabetes Blitz:  -Group instruction provided by PowerPoint slides, verbal discussion, and written materials to support subject matter. The instructor gives an explanation and review of the physiology behind type 1 and type 2 diabetes, diabetes medications and rational behind using different medications, pre- and post-prandial blood glucose recommendations and Hemoglobin A1c goals, diabetes diet, and exercise including blood glucose  guidelines for exercising safely.    Portion Distortion:  -Group instruction provided  by PowerPoint slides, verbal discussion, written materials, and food models to support subject matter. The instructor gives an explanation of serving size versus portion size, changes in portions sizes over the last 20 years, and what consists of a serving from each food group.   Stress Management:  -Group instruction provided by verbal instruction, video, and written materials to support subject matter.  Instructors review role of stress in heart disease and how to cope with stress positively.     Exercising on Your Own:  -Group instruction provided by verbal instruction, power point, and written materials to support subject.  Instructors discuss benefits of exercise, components of exercise, frequency and intensity of exercise, and end points for exercise.  Also discuss use of nitroglycerin and activating EMS.  Review options of places to exercise outside of rehab.  Review guidelines for sex with heart disease.   Cardiac Drugs I:  -Group instruction provided by verbal instruction and written materials to support subject.  Instructor reviews cardiac drug classes: antiplatelets, anticoagulants, beta blockers, and statins.  Instructor discusses reasons, side effects, and lifestyle considerations for each drug class.   Cardiac Drugs II:  -Group instruction provided by verbal instruction and written materials to support subject.  Instructor reviews cardiac drug classes: angiotensin converting enzyme inhibitors (ACE-I), angiotensin II receptor blockers (ARBs), nitrates, and calcium channel blockers.  Instructor discusses reasons, side effects, and lifestyle considerations for each drug class.      CARDIAC REHAB PHASE II EXERCISE from 02/09/2016 in [facility]   Date  02/09/16   Instruction Review Code  2- meets goals/outcomes      Anatomy and Physiology of the Circulatory System:   -Group instruction provided by verbal instruction, video, and written materials to support subject.  Reviews functional anatomy of heart, how it relates to various diagnoses, and what role the heart plays in the overall system.   Knowledge Questionnaire Score:     Knowledge Questionnaire Score - 02/07/16 1525    Knowledge Questionnaire Score   Pre Score 22/24          DM 11/15      Core Components/Risk Factors/Patient Goals at Admission:     Personal Goals and Risk Factors at Admission - 01/25/16 1455    Core Components/Risk Factors/Patient Goals on Admission   Admit Weight 171 lb 1.2 oz (77.6 kg)   Goal Weight: Short Term 165 lb (74.844 kg)   Goal Weight: Long Term 150 lb (68.04 kg)   Personal Goal Other Yes   Personal Goal Be able to walk/ get around better.   Intervention Provide individualized exercise guidelines to improve mobility and endurance.   Expected Outcomes Maintain exercise program at home including regular walk as part of exercise plan.      Core Components/Risk Factors/Patient Goals Review:      Goals and Risk Factor Review      02/16/16 1723           Core Components/Risk Factors/Patient Goals Review   Personal Goals Review Weight Management/Obesity;Increase Strength and Stamina       Review Maintaining weight at this time. Current weight is 77.2 kg.        Expected Outcomes Establish regular exercise program including aerobic and resistance training to help achieve weight loss goals and increase strength and stamina.          Core Components/Risk Factors/Patient Goals at Discharge (Final Review):      Goals and Risk Factor Review - 02/16/16 1723  Core Components/Risk Factors/Patient Goals Review   Personal Goals Review Weight Management/Obesity;Increase Strength and Stamina   Review Maintaining weight at this time. Current weight is 77.2 kg.    Expected Outcomes Establish regular exercise program including aerobic and resistance training to  help achieve weight loss goals and increase strength and stamina.      ITP Comments:     ITP Comments      01/25/16 1351           ITP Comments Medical Director - Dr. Fransico Him          Comments:  Pt is making expected progress toward personal goals after completing 6 sessions. Pt out this week due to gi distress.  Recommend continued Pt continues to demonstrate positive and healthy coping skills. Pt has an adequate support system. Pt with no psychosocial needs.  No further psychosocial interventions is required.exercise and life style modification education including  stress management and relaxation techniques to decrease cardiac risk profile. Cherre Huger, BSN

## 2016-02-18 ENCOUNTER — Encounter (HOSPITAL_COMMUNITY)
Admission: RE | Admit: 2016-02-18 | Discharge: 2016-02-18 | Disposition: A | Discharge status: Home / Self Care | Payer: Medicare Other | Source: Ambulatory Visit | Attending: Cardiovascular Disease | Admitting: Cardiovascular Disease

## 2016-02-18 NOTE — Progress Notes (Signed)
Incomplete Session Note  Patient Details  Name: Benjamin Harrison MRN: [id number] Date of Birth: Jan 12, 1935 Referring Provider:        CARDIAC REHAB PHASE II ORIENTATION from 01/25/2016 in [facility]   Referring Provider  [name], Arnette Norris, MD      [name] did not complete his rehab session.  [name] reported he has been out with a GI illness and had his last episode of diarrhea on 02/16/2015. I recognized per our policy that [name] not exercise today. [name] says he does not plan to return to exercise at cardiac rehab. [name] plans to continue exercise at the church and wants to join the gym near his house with [employer].

## 2016-02-18 NOTE — Progress Notes (Signed)
Discharge Summary  Patient Details  Name: Benjamin Harrison MRN: [id number] Date of Birth: 07-Dec-1934 Referring Provider:   Flowsheet Row CARDIAC REHAB PHASE II ORIENTATION from 01/25/2016 in [place]  Referring Provider  [name], Arnette Norris, MD       Number of Visits: 6  Reason for Discharge:  Early Exit:  Insurance  Smoking History:  History  Smoking Status  . Former Smoker  . Quit date: 08/01/1975  Smokeless Tobacco  . Not on file    Diagnosis:  No diagnosis found.  ADL UCSD:   Initial Exercise Prescription:     Initial Exercise Prescription - 01/26/16 0800      Date of Initial Exercise RX and Referring Provider   Date 01/25/16   Referring Provider [name], Arnette Norris, MD     Bike   Level 0.4   Minutes 10   METs 1.98     NuStep   Level 1   Minutes 10   METs 2     Track   Laps 8   Minutes 10   METs 2.39     Prescription Details   Frequency (times per week) 3   Duration Progress to 30 minutes of continuous aerobic without signs/symptoms of physical distress     Intensity   THRR 40-80% of Max Heartrate 57-114   Ratings of Perceived Exertion 11-13   Perceived Dyspnea 0-4     Progression   Progression Continue to progress workloads to maintain intensity without signs/symptoms of physical distress.     Resistance Training   Training Prescription Yes   Weight 1-2 lbs   Reps 10-12      Discharge Exercise Prescription (Final Exercise Prescription Changes):     Exercise Prescription Changes - 02/16/16 1700      Exercise Review   Progression Yes     Response to Exercise   Blood Pressure (Admit) 122/80   Blood Pressure (Exercise) 120/70   Blood Pressure (Exit) 120/80   Heart Rate (Admit) 65 bpm   Heart Rate (Exercise) 89 bpm   Heart Rate (Exit) 65 bpm   Rating of Perceived Exertion (Exercise) 10   Symptoms none   Duration Progress to 30 minutes of continuous aerobic without signs/symptoms of physical distress   Intensity THRR unchanged     Progression   Progression Continue to progress workloads to maintain intensity without signs/symptoms of physical distress.   Average METs 2.4     Resistance Training   Training Prescription Yes   Weight 2lbs   Reps 10-12     Interval Training   Interval Training No     Bike   Level 0.4   Minutes 10   METs 1.98     NuStep   Level 3   Minutes 10   METs 2.4     Arm Ergometer   Level 1.5   Minutes 10     Track   Laps 10   Minutes 10   METs 2.74      Functional Capacity:     6 Minute Walk    Row Name 01/25/16 1644         6 Minute Walk   Phase Initial     Distance 1325 feet     Walk Time 6 minutes     # of Rest Breaks 0     MPH 2.51     METS 2.09     RPE 13     VO2 Peak 7.32  Symptoms Yes (comment)     Comments c/o right knee stiffness     Resting HR 74 bpm     Resting BP 130/80     Max Ex. HR 86 bpm     Max Ex. BP 120/80     2 Minute Post BP 138/68        Psychological, QOL, Others - Outcomes: PHQ 2/9: Depression screen PHQ 2/9 02/02/2016  Decreased Interest 0  PHQ - 2 Score 0    Quality of Life:     Quality of Life - 01/26/16 0824      Quality of Life Scores   Health/Function Pre 23.46 %   Socioeconomic Pre 28.33 %   Psych/Spiritual Pre 27.5 %   Family Pre 24 %   GLOBAL Pre 25.34 %      Personal Goals: Goals established at orientation with interventions provided to work toward goal.     Personal Goals and Risk Factors at Admission - 01/25/16 1455      Core Components/Risk Factors/Patient Goals on Admission   Admit Weight 171 lb 1.2 oz (77.6 kg)   Goal Weight: Short Term 165 lb (74.8 kg)   Goal Weight: Long Term 150 lb (68 kg)   Personal Goal Other Yes   Personal Goal Be able to walk/ get around better.   Intervention Provide individualized exercise guidelines to improve mobility and endurance.   Expected Outcomes Maintain exercise program at home including regular walk as part of exercise plan.        Personal Goals Discharge:     Goals and Risk Factor Review    Row Name 02/16/16 1723 02/18/16 1314           Core Components/Risk Factors/Patient Goals Review   Personal Goals Review Weight Management/Obesity;Increase Strength and Stamina Other      Review Maintaining weight at this time. Current weight is 77.2 kg.  Reviewed home exercise guidleines with patient including target heart rate, rate of perceived exertion, and stop signs for exercise. Pt will exercise at the gym at his church and in the [employer] program.      Expected Outcomes Establish regular exercise program including aerobic and resistance training to help achieve weight loss goals and increase strength and stamina. Exercise 60 minutes, 5 days per week to maintain heart health and help with weight loss goals.         Nutrition & Weight - Outcomes:     Pre Biometrics - 01/25/16 1643      Pre Biometrics   Height 5\' 4"  (1.626 m)   Weight 171 lb 1.2 oz (77.6 kg)   Waist Circumference 40 inches   Hip Circumference 38.25 inches   Waist to Hip Ratio 1.05 %   BMI (Calculated) 29.4   Triceps Skinfold 13 mm   % Body Fat 28.5 %   Grip Strength 34.5 kg   Flexibility 11 in   Single Leg Stand 3.75 seconds         Post Biometrics - 02/18/16 1310       Post  Biometrics   Height 5\' 4"  (1.626 m)   Weight 170 lb 3.1 oz (77.2 kg)   BMI (Calculated) 29.3      Nutrition:     Nutrition Therapy & Goals - 02/07/16 1409      Nutrition Therapy   Diet Carb Modified, Therapeutic Lifestyle Changes     Personal Nutrition Goals   Personal Goal #1 1-2 lb wt loss per week to  a goal wt loss of 6-24 lb at graduation from [facility], educate and counsel regarding individualized specific dietary modifications aiming towards targeted core components such as weight, hypertension, lipid management, diabetes, heart failure and other comorbidities.   Expected Outcomes  Short Term Goal: Understand basic principles of dietary content, such as calories, fat, sodium, cholesterol and nutrients.;Long Term Goal: Adherence to prescribed nutrition plan.      Nutrition Discharge:     Nutrition Assessments - 02/07/16 1525      MEDFICTS Scores   Pre Score 71      Education Questionnaire Score:     Knowledge Questionnaire Score - 02/07/16 1525      Knowledge Questionnaire Score   Pre Score 22/24          DM 11/15      Goals reviewed with patient; copy given to patient. Benjamin Harrison is stopping participating in the program due to cost and will continue exercise at the church and through [employer]. Barnet Pall, RN,BSN 03/07/2016 10:05 AM

## 2016-02-18 NOTE — Progress Notes (Signed)
Reviewed home exercise guidelines with patient including endpoints, temperature precautions, target heart rate and rate of perceived exertion. Pt plans to walk, use gym at his church, and participate in the [employer] program as his mode of home exercise. Pt voices understanding of instructions given. Sol Passer, MS, ACSM CCEP

## 2016-02-23 ENCOUNTER — Encounter (HOSPITAL_COMMUNITY): Payer: Medicare Other

## 2016-02-25 ENCOUNTER — Encounter (HOSPITAL_COMMUNITY): Payer: Medicare Other

## 2016-03-01 ENCOUNTER — Encounter (HOSPITAL_COMMUNITY): Payer: Medicare Other

## 2016-03-03 ENCOUNTER — Encounter (HOSPITAL_COMMUNITY): Payer: Medicare Other

## 2016-03-08 ENCOUNTER — Encounter (HOSPITAL_COMMUNITY): Payer: Medicare Other

## 2016-03-10 ENCOUNTER — Encounter (HOSPITAL_COMMUNITY): Payer: Medicare Other

## 2016-03-15 ENCOUNTER — Encounter (HOSPITAL_COMMUNITY): Payer: Medicare Other

## 2016-03-17 ENCOUNTER — Encounter (HOSPITAL_COMMUNITY): Payer: Medicare Other

## 2016-03-22 ENCOUNTER — Encounter (HOSPITAL_COMMUNITY): Payer: Medicare Other

## 2016-03-22 ENCOUNTER — Other Ambulatory Visit: Payer: Medicare Other | Admitting: *Deleted

## 2016-03-22 DIAGNOSIS — I2583 Coronary atherosclerosis due to lipid rich plaque: Principal | ICD-10-CM

## 2016-03-22 DIAGNOSIS — I10 Essential (primary) hypertension: Secondary | ICD-10-CM

## 2016-03-22 DIAGNOSIS — I2510 Atherosclerotic heart disease of native coronary artery without angina pectoris: Secondary | ICD-10-CM

## 2016-03-22 DIAGNOSIS — E785 Hyperlipidemia, unspecified: Secondary | ICD-10-CM

## 2016-03-22 LAB — COMPREHENSIVE METABOLIC PANEL
ALT: 24 U/L (ref 9–46)
AST: 21 U/L (ref 10–35)
Albumin: 3.7 g/dL (ref 3.6–5.1)
Alkaline Phosphatase: 59 U/L (ref 40–115)
BUN: 18 mg/dL (ref 7–25)
CHLORIDE: 105 mmol/L (ref 98–110)
CO2: 27 mmol/L (ref 20–31)
CREATININE: 1.18 mg/dL (ref 0.70–1.18)
Calcium: 8.7 mg/dL (ref 8.6–10.3)
Glucose, Bld: 90 mg/dL (ref 65–99)
POTASSIUM: 3.5 mmol/L (ref 3.5–5.3)
SODIUM: 142 mmol/L (ref 135–146)
Total Bilirubin: 1.3 mg/dL — ABNORMAL HIGH (ref 0.2–1.2)
Total Protein: 6.5 g/dL (ref 6.1–8.1)

## 2016-03-22 LAB — LIPID PANEL
CHOL/HDL RATIO: 4.1 ratio (ref <=5.0)
Cholesterol: 134 mg/dL (ref 125–200)
HDL: 33 mg/dL — ABNORMAL LOW (ref >=40)
LDL CALC: 78 mg/dL (ref <130)
Triglycerides: 114 mg/dL (ref <150)
VLDL: 23 mg/dL (ref <30)

## 2016-03-22 NOTE — Addendum Note (Signed)
Addended by: Eulis Foster on: 03/22/2016 09:59 AM   Modules accepted: Orders

## 2016-03-24 ENCOUNTER — Telehealth: Payer: Self-pay | Admitting: Nurse Practitioner

## 2016-03-24 ENCOUNTER — Encounter (HOSPITAL_COMMUNITY): Payer: Medicare Other

## 2016-03-24 ENCOUNTER — Other Ambulatory Visit: Payer: Medicare Other

## 2016-03-24 DIAGNOSIS — I10 Essential (primary) hypertension: Secondary | ICD-10-CM

## 2016-03-24 MED ORDER — POTASSIUM CHLORIDE ER 10 MEQ PO TBCR: 10.0000 meq | EXTENDED_RELEASE_TABLET | Freq: Every day | ORAL | 11 refills | Status: DC

## 2016-03-24 NOTE — Telephone Encounter (Signed)
Reviewed results and plan of care with patient who verbalized understanding and agreement to start [place] 10 meq once daily.  He is scheduled for repeat bmet on 9/19.  He is scheduled for office visit on 8/29 and I will again remind him of this appointment.

## 2016-03-24 NOTE — Telephone Encounter (Signed)
-----   Message from Thayer Headings, MD sent at 03/24/2016  3:47 PM EDT ----- Lipids look good Potassium is on the  Low side He is on HCTZ 25 a day and no potassium Lets start Kdur 10 meq a day and recheck in 3 weeks

## 2016-03-28 ENCOUNTER — Encounter: Payer: Self-pay | Admitting: Cardiovascular Disease

## 2016-03-28 ENCOUNTER — Ambulatory Visit (INDEPENDENT_AMBULATORY_CARE_PROVIDER_SITE_OTHER): Payer: Medicare Other | Admitting: Cardiovascular Disease

## 2016-03-28 VITALS — BP 128/70 | HR 58 | Ht 64.0 in | Wt 170.8 lb

## 2016-03-28 DIAGNOSIS — I2510 Atherosclerotic heart disease of native coronary artery without angina pectoris: Secondary | ICD-10-CM

## 2016-03-28 DIAGNOSIS — I10 Essential (primary) hypertension: Secondary | ICD-10-CM | POA: Diagnosis not present

## 2016-03-28 DIAGNOSIS — I2583 Coronary atherosclerosis due to lipid rich plaque: Principal | ICD-10-CM

## 2016-03-28 MED ORDER — FAMOTIDINE 10 MG PO CHEW: 10.0000 mg | CHEWABLE_TABLET | Freq: Two times a day (BID) | ORAL | Status: DC | PRN

## 2016-03-28 NOTE — Progress Notes (Signed)
Cardiology Office Note   Date:  03/28/2016   ID:  KACY MUNIER, DOB [birth year], MRN [id number]  PCP:  Leonard Downing, MD  Cardiologist:   Mertie Moores, MD   Chief Complaint  Patient presents with  . Coronary Artery Disease   1. Coronary artery disease: Status post CABG in2001. He status post stenting of his mid right coronary artery in 10/17/2010 2. Hypertension 3. Hypothyroidism 4. Hyperlipidemia 5. Diabetes mellitus  History of Present Illness:  Altin is a 81 year old gentleman with a history of coronary artery disease. He status post PTCA and stenting of his right coronary. He had repeat stenting of his right coronary artery as recently as March, 2012. He had another heart attack and had severe stenosis of his distal right coronary artery stent. Dr. Martinique opened up the stent in March, 2013. Saphenous vein graft to right coronary artery is occluded.  Since that time he's had persistent headaches and generalized weakness. He's continued to have some episodes of chest pain. He's also had some episodes of lightheadedness.  He's feeling a bit better since I saw him several months ago. He still is not able to exercise with about 15 or 20 minutes. Is clear that he still eats salty foods. He enjoys peanut butter on Ritz crackers almost every night. He also eats occasional country ham and gravy biscuits.  October 07, 2012  He is doing well. He had a cath in November, 2013. He has been on Imdur and is feeling better. He occasionally has a headache.   Nov. 24, 2014:  Landyn is having more dyspnea with exertion recently - walking outside, taking a shower.  He avoids salt.  He has not had much if any chest pain.   Feb. 24, 2015: Korby is doing ok. He was having some dyspnea but this has resolved.   March 23, 2014: He is doing ok. He stopped taking his atorvastatin due to leg aches. His aches and pains are better since in the atorvastatin. He has  lots of problems with arthritis and has taken steroids. Staying active except as limited by his joints    Feb. 22, 2016: DAINE STOIA is a 81 y.o. male who presents for follow up of his CAD. He has been having some chest tightness and pain.  Occurred at night.  Was very weak for 3 days. Was just getting ready for bed.   Radiated out to his shoulder.  Occasionally has exertional CP / last for several minutes.  No palpitations,  No dizziness  ,  Does have some orthostatic hypotension.   Dec 04, 2014: Hazel was seen recently for some chest pain   Myoview shows: Overall Impression: There is a small area of moderate scar affecting the apical cap and the apical lateral segment. There is no ischemia. This is a low risk scan. The description of the study from December, 2014 is slightly different from this study. However, after careful review, I am not convinced that there has been any change. There is question of an area of insignificant scar near the apex. There is no ischemia.  LV Ejection Fraction: 58%. LV Wall Motion: Normal Wall Motion   Echo shows: Left ventricle: The cavity size was normal. There was mild focal basal hypertrophy of the septum. Systolic function was normal. The estimated ejection fraction was in the range of 55% to 60%. Probable mild hypokinesis of the basal-midinferior myocardium. Left ventricular diastolic function parameters were normal. - Aortic valve: There was trivial regurgitation.  Aug. 3, 2016:  Doing well. No CP.  We added Imdur at his last visit and is feeling much better.   Has DOE still His CP and dyspnea are better on the Imdur   Jan. 30, 2017:  Doing well.  Occasional CP .  Takes NTG on occasion .  Perhaps once a month   Aug. 29, 2017:  Izell had PCI ( angiosculp cutting balloon ) of his mid and distal right coronary artery on 12/06/2015. Breathing better.   Has taken 1-2 NTG .   Staying active Works out at [employer] 3 days a  week.    Gets tired quicker than he wants to . Also wants to decrease his Nexium Advised Pepcid complete   Past Medical History:  Diagnosis Date  . CHF (congestive heart failure) ([place])   . Coronary artery disease    a. CABG 2001 with early failure of VG-RCA. b. Inf MI after Lexiscan 01/28/2009 s/p RCA stent. c. Stent to dRCA 02/24/09. d. DES to [place]  2012. e. NSTEMI s/p angioplasty for ISR of RCA 09/2011. f. 05/2012 Cath: LM 20-30p, LAD 100p, LCX min irregs, RCA patent prox stent, 71m ISR, VG->Diag nl w/ dzs in D1 prox to graft, VG->RCA 100, VG->OM nl, LIMA->LAD nl, Nl EF->Med Rx; g. 09/2014 low-risk MV.  Marland Kitchen History of echocardiogram    a. 09/2014 Echo: EF 55-60%, mild basal-midinferior HK, triv AI.  Marland Kitchen Hyperlipidemia   . Hypertension   . Hypertensive heart disease   . Hypothyroidism   . Osteoarthritis    a.End-stage osteoarthritis, right knee, medial compartment  . Personal history of tobacco use   . Type II diabetes mellitus ([place])     Past Surgical History:  Procedure Laterality Date  . CARDIAC CATHETERIZATION    . CARDIAC CATHETERIZATION N/A 12/06/2015   Procedure: Left Heart Cath and Cors/Grafts Angiography;  Surgeon: Burnell Blanks, MD;  Location: [facility];  Service: Cardiovascular;  Laterality: N/A;  . CARDIAC CATHETERIZATION N/A 12/06/2015   Procedure: Coronary Balloon Angioplasty;  Surgeon: Burnell Blanks, MD;  Location: [facility];  Service: Cardiovascular;  Laterality: N/A;  . CORONARY ARTERY BYPASS GRAFT    . HERNIA REPAIR  1985  . KNEE ARTHROSCOPY  1999 and 2004  . LEFT HEART CATHETERIZATION WITH CORONARY ANGIOGRAM N/A 10/09/2011   Procedure: LEFT HEART CATHETERIZATION WITH CORONARY ANGIOGRAM;  Surgeon: Peter M Martinique, MD;  Location: [facility];  Service: Cardiovascular;  Laterality: N/A;  . LEFT HEART CATHETERIZATION WITH CORONARY/GRAFT ANGIOGRAM N/A 06/06/2012   Procedure: LEFT HEART CATHETERIZATION WITH Beatrix Fetters;   Surgeon: Peter M Martinique, MD;  Location: [facility];  Service: Cardiovascular;  Laterality: N/A;     Current Outpatient Prescriptions  Medication Sig Dispense Refill  . aspirin 81 MG tablet Take 1 tablet (81 mg total) by mouth daily.    Marland Kitchen atorvastatin (LIPITOR) 40 MG tablet Take 1 tablet by mouth  daily 90 tablet 3  . clopidogrel (PLAVIX) 75 MG tablet Take 1 tablet by mouth  daily 90 tablet 3  . Esomeprazole Magnesium (NEXIUM PO) Take 22.3 mg by mouth daily.    Marland Kitchen glimepiride (AMARYL) 4 MG tablet Take 4 mg by mouth 2 (two) times daily.     . hydrochlorothiazide (HYDRODIURIL) 25 MG tablet Take 1 tablet (25 mg total) by mouth daily. 90 tablet 3  . isosorbide mononitrate (IMDUR) 60 MG 24 hr tablet Take 1 tablet by mouth  daily 90 tablet 3  .  levothyroxine (SYNTHROID, LEVOTHROID) 100 MCG tablet Take 100 mcg by mouth daily.    . metFORMIN (GLUCOPHAGE) 1000 MG tablet Take by mouth daily with breakfast. Pt takes 1000mg  in the morning and 500mg  in the evening    . metFORMIN (GLUCOPHAGE) 500 MG tablet Take 500 mg by mouth every evening. Pt takes 1000mg  in the morning and 500mg  in the evening    . metoprolol tartrate (LOPRESSOR) 25 MG tablet Take 1 tablet by mouth two  times daily 180 tablet 3  . nitroGLYCERIN (NITROSTAT) 0.4 MG SL tablet Place 1 tablet (0.4 mg total) under the tongue every 5 (five) minutes as needed. For chest pain. 25 tablet 6  . potassium chloride (K-DUR) 10 MEQ tablet Take 1 tablet (10 mEq total) by mouth daily. 30 tablet 11  . valsartan (DIOVAN) 80 MG tablet Take 1 tablet by mouth  daily 90 tablet 3   No current facility-administered medications for this visit.     Allergies:   Codeine    Social History:  The patient  reports that he quit smoking about 40 years ago. He has never used smokeless tobacco. He reports that he does not drink alcohol or use drugs.   Family History:  The patient's family history includes Heart disease in his brother and brother; Hypertension in his  mother; Pneumonia in his brother; Prostate cancer in his brother; Throat cancer in his sister; Unexplained death in his brother, brother, and mother.    ROS:  Please see the history of present illness.    Review of Systems: Constitutional:  denies fever, chills, diaphoresis, appetite change and fatigue.  HEENT: denies photophobia, eye pain, redness, hearing loss, ear pain, congestion, sore throat, rhinorrhea, sneezing, neck pain, neck stiffness and tinnitus.  Respiratory: denies SOB, DOE, cough, chest tightness, and wheezing.  Cardiovascular: admits to chest pain,  , exertional CP   Gastrointestinal: denies nausea, vomiting, abdominal pain, diarrhea, constipation, blood in stool.  Genitourinary: denies dysuria, urgency, frequency, hematuria, flank pain and difficulty urinating.  Musculoskeletal: denies  myalgias, back pain, joint swelling, arthralgias and gait problem.   Skin: denies pallor, rash and wound.  Neurological: denies dizziness, seizures, syncope, weakness, light-headedness, numbness and headaches.   Hematological: denies adenopathy, easy bruising, personal or family bleeding history.  Psychiatric/ Behavioral: denies suicidal ideation, mood changes, confusion, nervousness, sleep disturbance and agitation.       All other systems are reviewed and negative.    PHYSICAL EXAM: VS:  BP 128/70   Pulse (!) 58   Ht 5\' 4"  (1.626 m)   Wt 170 lb 12.8 oz (77.5 kg)   SpO2 98%   BMI 29.32 kg/m  , BMI Body mass index is 29.32 kg/m. GEN: Well nourished, well developed, in no acute distress  HEENT: normal  Neck: no JVD, carotid bruits, or masses Cardiac: RRR; no murmurs, rubs, or gallops,no edema  Respiratory:  clear to auscultation bilaterally, normal work of breathing GI: soft, nontender, nondistended, + BS MS: no deformity or atrophy  Skin: warm and dry, no rash Neuro:  Strength and sensation are intact Psych: normal   EKG:  EKG is ordered today. Sinus brady 56.    Otherwise normal .    Recent Labs: 12/07/2015: Hemoglobin 12.3; Platelets 169 03/22/2016: ALT 24; BUN 18; Creat 1.18; Potassium 3.5; Sodium 142    Lipid Panel    Component Value Date/Time   CHOL 134 03/22/2016 0959   TRIG 114 03/22/2016 0959   HDL 33 (L) 03/22/2016 0959   CHOLHDL  4.1 03/22/2016 0959   VLDL 23 03/22/2016 0959   LDLCALC 78 03/22/2016 0959      Wt Readings from Last 3 Encounters:  03/28/16 170 lb 12.8 oz (77.5 kg)  02/18/16 170 lb 3.1 oz (77.2 kg)  01/25/16 171 lb 1.2 oz (77.6 kg)      Other studies Reviewed: Additional studies/ records that were reviewed today include: . Review of the above records demonstrates:    ASSESSMENT AND PLAN:  1. Coronary artery disease: Status post CABG in2001.  He is s/p cutting balloon of his mid and distal RCA.  Continue plavix. Will DC Nexium and have him take pepcid complete or other H2 blockers.    2. Hypertension - BP is well controlled.   3. Hypothyroidism -  Followed by his medical doctor   4. Hyperlipidemia - will check lipids in 6 months .  5. Diabetes mellitus   Current medicines are reviewed at length with the patient today.  The patient does not have concerns regarding medicines.  The following changes have been made:  See above.    Disposition:   FU with me in 6 months    Signed, Mertie Moores, MD  03/28/2016 9:07 AM    [facility] [address] [place], [place], [place]  [address] Phone: [phone]; Fax: [phone]

## 2016-03-28 NOTE — Patient Instructions (Addendum)
Medication Instructions:  TAKE over the counter Pepcid for your heartburn/indigestion STOP Nexium   Labwork: None Ordered   Testing/Procedures: None Ordered   Follow-Up: Your physician wants you to follow-up in: 6 months with Dr. Acie Fredrickson.  You will receive a reminder letter in the mail two months in advance. If you don't receive a letter, please call our office to schedule the follow-up appointment.   If you need a refill on your cardiac medications before your next appointment, please call your pharmacy.   Thank you for choosing [facility]! Christen Bame, RN [phone]

## 2016-03-29 ENCOUNTER — Encounter (HOSPITAL_COMMUNITY): Payer: Medicare Other

## 2016-03-31 ENCOUNTER — Encounter (HOSPITAL_COMMUNITY): Payer: Medicare Other

## 2016-04-05 ENCOUNTER — Encounter (HOSPITAL_COMMUNITY): Payer: Medicare Other

## 2016-04-07 ENCOUNTER — Encounter (HOSPITAL_COMMUNITY): Payer: Medicare Other

## 2016-04-12 ENCOUNTER — Encounter (HOSPITAL_COMMUNITY): Payer: Medicare Other

## 2016-04-13 ENCOUNTER — Encounter: Payer: Self-pay | Admitting: Cardiovascular Disease

## 2016-04-14 ENCOUNTER — Encounter (HOSPITAL_COMMUNITY): Payer: Medicare Other

## 2016-04-18 ENCOUNTER — Other Ambulatory Visit: Payer: Medicare Other

## 2016-04-19 ENCOUNTER — Encounter (HOSPITAL_COMMUNITY): Payer: Medicare Other

## 2016-04-21 ENCOUNTER — Encounter (HOSPITAL_COMMUNITY): Payer: Medicare Other

## 2016-04-24 ENCOUNTER — Telehealth: Payer: Self-pay | Admitting: Cardiovascular Disease

## 2016-04-24 NOTE — Telephone Encounter (Signed)
New message     Pt c/o medication issue:  1. Name of Medication: potassium   2. How are you currently taking this medication (dosage and times per day)? 10mg  1xday  3. Are you having a reaction (difficulty breathing--STAT)? no  4. What is your medication issue? Do pt still need to take it   Pt verbalized that Dr.Elkins office in [place] took his lab work and he wants to know do Dr.Nasher still want him to take potassium

## 2016-04-24 NOTE — Telephone Encounter (Signed)
Continue current dose of Kdur Labs look good

## 2016-04-24 NOTE — Telephone Encounter (Signed)
Calling stating he had some lab work done at Dr. Arelia Sneddon office on 9/14 and was told his K+ was good.  Wanted to know if needs to continue taking [place] 10 meq daily.  Results from Dr. Arelia Sneddon office has been scanned into Epic and K+ was 4.8. On 8/24 K+ was 3.5.  Advised Dr. Acie Fredrickson not in office today but will forward message to him for recommendations.

## 2016-04-24 NOTE — Telephone Encounter (Signed)
Notified that Dr. Acie Fredrickson wants him to continue on the [place].  He verbalizes understanding and will call for renewal.

## 2016-04-26 ENCOUNTER — Encounter (HOSPITAL_COMMUNITY): Payer: Medicare Other

## 2016-04-28 ENCOUNTER — Other Ambulatory Visit: Payer: Self-pay | Admitting: Cardiovascular Disease

## 2016-04-28 ENCOUNTER — Encounter (HOSPITAL_COMMUNITY): Payer: Medicare Other

## 2016-05-03 ENCOUNTER — Encounter (HOSPITAL_COMMUNITY): Payer: Medicare Other

## 2016-05-04 ENCOUNTER — Other Ambulatory Visit: Payer: Self-pay | Admitting: Gastroenterology

## 2016-05-04 DIAGNOSIS — R1013 Epigastric pain: Secondary | ICD-10-CM

## 2016-05-05 ENCOUNTER — Ambulatory Visit
Admission: RE | Admit: 2016-05-05 | Discharge: 2016-05-05 | Disposition: A | Discharge status: Home / Self Care | Payer: Medicare Other | Source: Ambulatory Visit | Attending: Gastroenterology | Admitting: Gastroenterology

## 2016-05-05 ENCOUNTER — Encounter (HOSPITAL_COMMUNITY): Payer: Medicare Other

## 2016-05-05 DIAGNOSIS — R1013 Epigastric pain: Secondary | ICD-10-CM

## 2016-05-10 ENCOUNTER — Encounter (HOSPITAL_COMMUNITY): Payer: Medicare Other

## 2016-05-12 ENCOUNTER — Encounter (HOSPITAL_COMMUNITY): Payer: Medicare Other

## 2016-05-17 ENCOUNTER — Encounter (HOSPITAL_COMMUNITY): Payer: Medicare Other

## 2016-05-19 ENCOUNTER — Encounter (HOSPITAL_COMMUNITY): Payer: Medicare Other

## 2016-05-24 ENCOUNTER — Encounter (HOSPITAL_COMMUNITY): Payer: Medicare Other

## 2016-05-26 ENCOUNTER — Encounter (HOSPITAL_COMMUNITY): Payer: Medicare Other

## 2016-05-31 ENCOUNTER — Encounter (HOSPITAL_COMMUNITY): Payer: Medicare Other

## 2016-06-02 ENCOUNTER — Encounter (HOSPITAL_COMMUNITY): Payer: Medicare Other

## 2016-06-07 ENCOUNTER — Encounter (HOSPITAL_COMMUNITY): Payer: Medicare Other

## 2016-06-09 ENCOUNTER — Encounter (HOSPITAL_COMMUNITY): Payer: Medicare Other

## 2016-06-14 ENCOUNTER — Encounter (HOSPITAL_COMMUNITY): Payer: Medicare Other

## 2016-06-16 ENCOUNTER — Encounter (HOSPITAL_COMMUNITY): Payer: Medicare Other

## 2016-06-21 ENCOUNTER — Encounter (HOSPITAL_COMMUNITY): Payer: Medicare Other

## 2016-06-23 ENCOUNTER — Encounter (HOSPITAL_COMMUNITY): Payer: Medicare Other

## 2016-08-08 ENCOUNTER — Observation Stay (HOSPITAL_COMMUNITY): Payer: Medicare Other

## 2016-08-08 ENCOUNTER — Encounter (HOSPITAL_COMMUNITY): Payer: Self-pay | Admitting: Emergency Medicine

## 2016-08-08 ENCOUNTER — Observation Stay (HOSPITAL_COMMUNITY)
Admission: EM | Admit: 2016-08-08 | Discharge: 2016-08-10 | Disposition: A | Discharge status: Home / Self Care | Payer: Medicare Other | Attending: Internal Medicine | Admitting: Internal Medicine

## 2016-08-08 ENCOUNTER — Emergency Department (HOSPITAL_COMMUNITY): Payer: Medicare Other

## 2016-08-08 DIAGNOSIS — E872 Acidosis, unspecified: Secondary | ICD-10-CM

## 2016-08-08 DIAGNOSIS — A419 Sepsis, unspecified organism: Secondary | ICD-10-CM | POA: Diagnosis present

## 2016-08-08 DIAGNOSIS — Z79899 Other long term (current) drug therapy: Secondary | ICD-10-CM | POA: Insufficient documentation

## 2016-08-08 DIAGNOSIS — E860 Dehydration: Principal | ICD-10-CM | POA: Insufficient documentation

## 2016-08-08 DIAGNOSIS — I110 Hypertensive heart disease with heart failure: Secondary | ICD-10-CM | POA: Insufficient documentation

## 2016-08-08 DIAGNOSIS — Z87891 Personal history of nicotine dependence: Secondary | ICD-10-CM | POA: Diagnosis not present

## 2016-08-08 DIAGNOSIS — R05 Cough: Secondary | ICD-10-CM | POA: Diagnosis present

## 2016-08-08 DIAGNOSIS — Z7982 Long term (current) use of aspirin: Secondary | ICD-10-CM | POA: Diagnosis not present

## 2016-08-08 DIAGNOSIS — I714 Abdominal aortic aneurysm, without rupture, unspecified: Secondary | ICD-10-CM

## 2016-08-08 DIAGNOSIS — R059 Cough, unspecified: Secondary | ICD-10-CM | POA: Diagnosis present

## 2016-08-08 DIAGNOSIS — Z7902 Long term (current) use of antithrombotics/antiplatelets: Secondary | ICD-10-CM | POA: Insufficient documentation

## 2016-08-08 DIAGNOSIS — E785 Hyperlipidemia, unspecified: Secondary | ICD-10-CM | POA: Diagnosis present

## 2016-08-08 DIAGNOSIS — K529 Noninfective gastroenteritis and colitis, unspecified: Secondary | ICD-10-CM | POA: Diagnosis present

## 2016-08-08 DIAGNOSIS — E118 Type 2 diabetes mellitus with unspecified complications: Secondary | ICD-10-CM

## 2016-08-08 DIAGNOSIS — I2510 Atherosclerotic heart disease of native coronary artery without angina pectoris: Secondary | ICD-10-CM | POA: Insufficient documentation

## 2016-08-08 DIAGNOSIS — R109 Unspecified abdominal pain: Secondary | ICD-10-CM | POA: Insufficient documentation

## 2016-08-08 DIAGNOSIS — I252 Old myocardial infarction: Secondary | ICD-10-CM | POA: Insufficient documentation

## 2016-08-08 DIAGNOSIS — K219 Gastro-esophageal reflux disease without esophagitis: Secondary | ICD-10-CM | POA: Diagnosis not present

## 2016-08-08 DIAGNOSIS — Z7984 Long term (current) use of oral hypoglycemic drugs: Secondary | ICD-10-CM | POA: Diagnosis not present

## 2016-08-08 DIAGNOSIS — I10 Essential (primary) hypertension: Secondary | ICD-10-CM | POA: Diagnosis present

## 2016-08-08 DIAGNOSIS — I509 Heart failure, unspecified: Secondary | ICD-10-CM | POA: Insufficient documentation

## 2016-08-08 DIAGNOSIS — Z951 Presence of aortocoronary bypass graft: Secondary | ICD-10-CM | POA: Diagnosis not present

## 2016-08-08 DIAGNOSIS — E119 Type 2 diabetes mellitus without complications: Secondary | ICD-10-CM | POA: Diagnosis not present

## 2016-08-08 DIAGNOSIS — N179 Acute kidney failure, unspecified: Secondary | ICD-10-CM | POA: Diagnosis present

## 2016-08-08 DIAGNOSIS — N189 Chronic kidney disease, unspecified: Secondary | ICD-10-CM | POA: Diagnosis present

## 2016-08-08 DIAGNOSIS — E039 Hypothyroidism, unspecified: Secondary | ICD-10-CM | POA: Diagnosis present

## 2016-08-08 LAB — GLUCOSE, CAPILLARY: Glucose-Capillary: 118 mg/dL — ABNORMAL HIGH (ref 65–99)

## 2016-08-08 LAB — URINALYSIS, ROUTINE W REFLEX MICROSCOPIC
Bacteria, UA: NONE SEEN
Glucose, UA: NEGATIVE mg/dL
Hgb urine dipstick: NEGATIVE
Ketones, ur: NEGATIVE mg/dL
Leukocytes, UA: NEGATIVE
Nitrite: NEGATIVE
Protein, ur: 30 mg/dL — AB
SPECIFIC GRAVITY, URINE: 1.026 (ref 1.005–1.030)
pH: 5 (ref 5.0–8.0)

## 2016-08-08 LAB — COMPREHENSIVE METABOLIC PANEL
ALT: 25 U/L (ref 17–63)
AST: 25 U/L (ref 15–41)
Albumin: 3.4 g/dL — ABNORMAL LOW (ref 3.5–5.0)
Alkaline Phosphatase: 65 U/L (ref 38–126)
Anion gap: 13 (ref 5–15)
BUN: 25 mg/dL — AB (ref 6–20)
CO2: 25 mmol/L (ref 22–32)
CREATININE: 1.48 mg/dL — AB (ref 0.61–1.24)
Calcium: 8.8 mg/dL — ABNORMAL LOW (ref 8.9–10.3)
Chloride: 98 mmol/L — ABNORMAL LOW (ref 101–111)
GFR calc Af Amer: 50 mL/min — ABNORMAL LOW (ref >60)
GFR calc non Af Amer: 43 mL/min — ABNORMAL LOW (ref >60)
Glucose, Bld: 209 mg/dL — ABNORMAL HIGH (ref 65–99)
Potassium: 4 mmol/L (ref 3.5–5.1)
SODIUM: 136 mmol/L (ref 135–145)
Total Bilirubin: 1.3 mg/dL — ABNORMAL HIGH (ref 0.3–1.2)
Total Protein: 6.8 g/dL (ref 6.5–8.1)

## 2016-08-08 LAB — I-STAT TROPONIN, ED: Troponin i, poc: 0 ng/mL (ref 0.00–0.08)

## 2016-08-08 LAB — PROCALCITONIN: Procalcitonin: 2.43 ng/mL

## 2016-08-08 LAB — CBC
HCT: 44.3 % (ref 39.0–52.0)
Hemoglobin: 14.9 g/dL (ref 13.0–17.0)
MCH: 25.3 pg — AB (ref 26.0–34.0)
MCHC: 33.6 g/dL (ref 30.0–36.0)
MCV: 75.3 fL — AB (ref 78.0–100.0)
PLATELETS: 248 10*3/uL (ref 150–400)
RBC: 5.88 MIL/uL — ABNORMAL HIGH (ref 4.22–5.81)
RDW: 14.9 % (ref 11.5–15.5)
WBC: 11 10*3/uL — ABNORMAL HIGH (ref 4.0–10.5)

## 2016-08-08 LAB — I-STAT CG4 LACTIC ACID, ED
LACTIC ACID, VENOUS: 2.25 mmol/L — AB (ref 0.5–1.9)
LACTIC ACID, VENOUS: 1.44 mmol/L (ref 0.5–1.9)

## 2016-08-08 LAB — LACTIC ACID, PLASMA: Lactic Acid, Venous: 1.3 mmol/L (ref 0.5–1.9)

## 2016-08-08 LAB — LIPASE, BLOOD: LIPASE: 16 U/L (ref 11–51)

## 2016-08-08 LAB — BRAIN NATRIURETIC PEPTIDE: B Natriuretic Peptide: 66 pg/mL (ref 0.0–100.0)

## 2016-08-08 MED ORDER — IOPAMIDOL (ISOVUE-300) INJECTION 61%
INTRAVENOUS | Status: AC
  Administered 2016-08-08: 75 mL
  Filled 2016-08-08: qty 75

## 2016-08-08 MED ORDER — CIPROFLOXACIN IN D5W 400 MG/200ML IV SOLN
400.0000 mg | Freq: Once | INTRAVENOUS | Status: AC
  Administered 2016-08-08: 400 mg via INTRAVENOUS
  Filled 2016-08-08: qty 200

## 2016-08-08 MED ORDER — INSULIN ASPART 100 UNIT/ML ~~LOC~~ SOLN
0.0000 [IU] | Freq: Three times a day (TID) | SUBCUTANEOUS | Status: DC
  Administered 2016-08-09 – 2016-08-10 (×3): 1 [IU] via SUBCUTANEOUS

## 2016-08-08 MED ORDER — ZOLPIDEM TARTRATE 5 MG PO TABS: 5.0000 mg | ORAL_TABLET | Freq: Every evening | ORAL | Status: DC | PRN

## 2016-08-08 MED ORDER — METRONIDAZOLE IN NACL 5-0.79 MG/ML-% IV SOLN
500.0000 mg | Freq: Once | INTRAVENOUS | Status: AC
  Administered 2016-08-08: 500 mg via INTRAVENOUS
  Filled 2016-08-08: qty 100

## 2016-08-08 MED ORDER — SODIUM CHLORIDE 0.9% FLUSH
3.0000 mL | Freq: Two times a day (BID) | INTRAVENOUS | Status: DC
  Administered 2016-08-09: 3 mL via INTRAVENOUS

## 2016-08-08 MED ORDER — ACETAMINOPHEN 650 MG RE SUPP: 650.0000 mg | Freq: Four times a day (QID) | RECTAL | Status: DC | PRN

## 2016-08-08 MED ORDER — ASPIRIN EC 81 MG PO TBEC
81.0000 mg | DELAYED_RELEASE_TABLET | Freq: Every day | ORAL | Status: DC
  Administered 2016-08-09 – 2016-08-10 (×2): 81 mg via ORAL
  Filled 2016-08-08 (×2): qty 1

## 2016-08-08 MED ORDER — LEVOTHYROXINE SODIUM 100 MCG PO TABS
100.0000 ug | ORAL_TABLET | Freq: Every day | ORAL | Status: DC
  Administered 2016-08-09 – 2016-08-10 (×2): 100 ug via ORAL
  Filled 2016-08-08 (×2): qty 1

## 2016-08-08 MED ORDER — NITROGLYCERIN 0.4 MG SL SUBL: 0.4000 mg | SUBLINGUAL_TABLET | SUBLINGUAL | Status: DC | PRN

## 2016-08-08 MED ORDER — METOPROLOL TARTRATE 25 MG PO TABS
25.0000 mg | ORAL_TABLET | Freq: Two times a day (BID) | ORAL | Status: DC
  Administered 2016-08-08 – 2016-08-10 (×3): 25 mg via ORAL
  Filled 2016-08-08 (×4): qty 1

## 2016-08-08 MED ORDER — HYDRALAZINE HCL 20 MG/ML IJ SOLN: 5.0000 mg | INTRAMUSCULAR | Status: DC | PRN

## 2016-08-08 MED ORDER — FAMOTIDINE 10 MG PO TABS: 10.0000 mg | ORAL_TABLET | Freq: Two times a day (BID) | ORAL | Status: DC | PRN

## 2016-08-08 MED ORDER — ACETAMINOPHEN 325 MG PO TABS: 650.0000 mg | ORAL_TABLET | Freq: Four times a day (QID) | ORAL | Status: DC | PRN

## 2016-08-08 MED ORDER — GUAIFENESIN ER 600 MG PO TB12
600.0000 mg | ORAL_TABLET | Freq: Two times a day (BID) | ORAL | Status: DC
  Administered 2016-08-08 – 2016-08-10 (×4): 600 mg via ORAL
  Filled 2016-08-08 (×4): qty 1

## 2016-08-08 MED ORDER — SODIUM CHLORIDE 0.9 % IV BOLUS (SEPSIS)
500.0000 mL | Freq: Once | INTRAVENOUS | Status: AC
  Administered 2016-08-08: 500 mL via INTRAVENOUS

## 2016-08-08 MED ORDER — ALBUTEROL SULFATE (2.5 MG/3ML) 0.083% IN NEBU: 2.5000 mg | INHALATION_SOLUTION | RESPIRATORY_TRACT | Status: DC | PRN

## 2016-08-08 MED ORDER — INSULIN ASPART 100 UNIT/ML ~~LOC~~ SOLN: 0.0000 [IU] | Freq: Every day | SUBCUTANEOUS | Status: DC

## 2016-08-08 MED ORDER — ISOSORBIDE MONONITRATE ER 60 MG PO TB24
60.0000 mg | ORAL_TABLET | Freq: Every day | ORAL | Status: DC
  Administered 2016-08-08 – 2016-08-09 (×2): 60 mg via ORAL
  Filled 2016-08-08 (×2): qty 1

## 2016-08-08 MED ORDER — ONDANSETRON HCL 4 MG/2ML IJ SOLN
4.0000 mg | Freq: Three times a day (TID) | INTRAMUSCULAR | Status: DC | PRN
Start: 2016-08-08 — End: 2016-08-10

## 2016-08-08 MED ORDER — ONDANSETRON HCL 4 MG/2ML IJ SOLN
4.0000 mg | Freq: Once | INTRAMUSCULAR | Status: AC
  Administered 2016-08-08: 4 mg via INTRAVENOUS
  Filled 2016-08-08: qty 2

## 2016-08-08 MED ORDER — SODIUM CHLORIDE 0.9 % IV SOLN
INTRAVENOUS | Status: AC
  Administered 2016-08-08: 20:00:00 via INTRAVENOUS

## 2016-08-08 MED ORDER — CLOPIDOGREL BISULFATE 75 MG PO TABS
75.0000 mg | ORAL_TABLET | Freq: Every day | ORAL | Status: DC
  Administered 2016-08-09 – 2016-08-10 (×2): 75 mg via ORAL
  Filled 2016-08-08 (×2): qty 1

## 2016-08-08 MED ORDER — ENOXAPARIN SODIUM 40 MG/0.4ML ~~LOC~~ SOLN
40.0000 mg | SUBCUTANEOUS | Status: DC
  Administered 2016-08-09 – 2016-08-10 (×2): 40 mg via SUBCUTANEOUS
  Filled 2016-08-08 (×2): qty 0.4

## 2016-08-08 MED ORDER — MORPHINE SULFATE (PF) 4 MG/ML IV SOLN: 2.0000 mg | INTRAVENOUS | Status: DC | PRN

## 2016-08-08 MED ORDER — ATORVASTATIN CALCIUM 40 MG PO TABS
40.0000 mg | ORAL_TABLET | Freq: Every day | ORAL | Status: DC
  Administered 2016-08-09 – 2016-08-10 (×2): 40 mg via ORAL
  Filled 2016-08-08 (×2): qty 1

## 2016-08-08 MED ORDER — AZITHROMYCIN 250 MG PO TABS
250.0000 mg | ORAL_TABLET | Freq: Every day | ORAL | Status: DC
  Administered 2016-08-09: 250 mg via ORAL
  Filled 2016-08-08: qty 1

## 2016-08-08 NOTE — ED Notes (Signed)
Patient transported to X-ray 

## 2016-08-08 NOTE — ED Provider Notes (Signed)
[facility] DEPT Provider Note   CSN: [id number] Arrival date & time: 08/08/16  0954     History   Chief Complaint Chief Complaint  Patient presents with  . Emesis  . Influenza  . Dehydration    HPI Benjamin Harrison is a 82 y.o. male.  The history is provided by the patient and the spouse.  Emesis   This is a new problem. The current episode started more than 1 week ago. Episode frequency: multiple times per day. The problem has been gradually worsening. The emesis has an appearance of stomach contents and bilious material. There has been no fever. Associated symptoms include abdominal pain and diarrhea. Pertinent negatives include no chills, no fever and no headaches.  -Pt has also had mild chest pain that the patient attributes to vomiting for the last week and that it feels different compared to prior cardiac chest pain. -Pt endorses flu like illness 2 saturdays ago but has felt poorly since with continued n/v and diarrhea. Started noticing green vomit last night.   Past Medical History:  Diagnosis Date  . CHF (congestive heart failure) ([place])   . Coronary artery disease    a. CABG 2001 with early failure of VG-RCA. b. Inf MI after Lexiscan 01/28/2009 s/p RCA stent. c. Stent to dRCA 02/24/09. d. DES to [place]  2012. e. NSTEMI s/p angioplasty for ISR of RCA 09/2011. f. 05/2012 Cath: LM 20-30p, LAD 100p, LCX min irregs, RCA patent prox stent, 17m ISR, VG->Diag nl w/ dzs in D1 prox to graft, VG->RCA 100, VG->OM nl, LIMA->LAD nl, Nl EF->Med Rx; g. 09/2014 low-risk MV.  Marland Kitchen History of echocardiogram    a. 09/2014 Echo: EF 55-60%, mild basal-midinferior HK, triv AI.  Marland Kitchen Hyperlipidemia   . Hypertension   . Hypertensive heart disease   . Hypothyroidism   . Osteoarthritis    a.End-stage osteoarthritis, right knee, medial compartment  . Personal history of tobacco use   . Type II diabetes mellitus [facility])     Patient Active Problem List   Diagnosis Date Noted  . Hypertensive heart  disease   . Type II diabetes mellitus ([place])   . Thrombocytopenia ([place]) 06/08/2012  . NSTEMI (non-ST elevated myocardial infarction) ([place]) 10/09/2011  . Unstable angina ([place]) 10/08/2011  . Colitis 07/06/2011  . Diabetes mellitus ([place]) 07/05/2011  . Diarrhea 07/05/2011  . Weakness generalized 07/05/2011  . Fatigue 11/11/2010  . Coronary artery disease   . Hypertension   . Hypothyroidism   . Hyperlipidemia   . SOB (shortness of breath) on exertion   . Personal history of tobacco use     Past Surgical History:  Procedure Laterality Date  . CARDIAC CATHETERIZATION    . CARDIAC CATHETERIZATION N/A 12/06/2015   Procedure: Left Heart Cath and Cors/Grafts Angiography;  Surgeon: Burnell Blanks, MD;  Location: [facility] CV LAB;  Service: Cardiovascular;  Laterality: N/A;  . CARDIAC CATHETERIZATION N/A 12/06/2015   Procedure: Coronary Balloon Angioplasty;  Surgeon: Burnell Blanks, MD;  Location: [place] CV LAB;  Service: Cardiovascular;  Laterality: N/A;  . CORONARY ARTERY BYPASS GRAFT    . HERNIA REPAIR  1985  . KNEE ARTHROSCOPY  1999 and 2004  . LEFT HEART CATHETERIZATION WITH CORONARY ANGIOGRAM N/A 10/09/2011   Procedure: LEFT HEART CATHETERIZATION WITH CORONARY ANGIOGRAM;  Surgeon: Peter M Martinique, MD;  Location: [facility] C[facility] LAB;  Service: Cardiovascular;  Laterality: N/A;  . LEFT HEART CATHETERIZATION WITH CORONARY/GRAFT ANGIOGRAM N/A 06/06/2012   Procedure: LEFT HEART  CATHETERIZATION WITH Beatrix Fetters;  Surgeon: Peter M Martinique, MD;  Location: [facility] CATH LAB;  Service: Cardiovascular;  Laterality: N/A;       Home Medications    Prior to Admission medications   Medication Sig Start Date End Date Taking? Authorizing Provider  aspirin 81 MG tablet Take 1 tablet (81 mg total) by mouth daily. 10/10/11  Yes Rogelia Mire, NP  atorvastatin (LIPITOR) 40 MG tablet TAKE 1 TABLET BY MOUTH  DAILY 04/28/16  Yes Thayer Headings, MD  azithromycin (ZITHROMAX) 250 MG tablet  Take 250 mg by mouth daily. Started on 08-07-16 take 2 tablets by mouth today then take 1 tablet daily for 4 days   Yes Historical Provider, MD  clopidogrel (PLAVIX) 75 MG tablet TAKE 1 TABLET BY MOUTH  DAILY 04/28/16  Yes Thayer Headings, MD  Esomeprazole Magnesium (NEXIUM PO) Take 1 tablet by mouth daily. 22.3mg    Yes Historical Provider, MD  famotidine (PEPCID AC) 10 MG chewable tablet Chew 1 tablet (10 mg total) by mouth 2 (two) times daily as needed for heartburn. 03/28/16  Yes Thayer Headings, MD  glimepiride (AMARYL) 4 MG tablet Take 4 mg by mouth 2 (two) times daily.    Yes Historical Provider, MD  hydrochlorothiazide (HYDRODIURIL) 25 MG tablet Take 1 tablet (25 mg total) by mouth daily. 12/19/13  Yes Thayer Headings, MD  isosorbide mononitrate (IMDUR) 60 MG 24 hr tablet Take 1 tablet by mouth  daily 10/05/15  Yes Thayer Headings, MD  levothyroxine (SYNTHROID, LEVOTHROID) 100 MCG tablet Take 100 mcg by mouth daily.   Yes Historical Provider, MD  metFORMIN (GLUCOPHAGE) 1000 MG tablet Take by mouth daily with breakfast. Pt takes 1000mg  in the morning and 500mg  in the evening 03/13/14  Yes Historical Provider, MD  metoprolol tartrate (LOPRESSOR) 25 MG tablet TAKE 1 TABLET BY MOUTH TWO  TIMES DAILY 04/28/16  Yes Thayer Headings, MD  nitroGLYCERIN (NITROSTAT) 0.4 MG SL tablet Place 1 tablet (0.4 mg total) under the tongue every 5 (five) minutes as needed. For chest pain. 08/30/15  Yes Thayer Headings, MD  ondansetron (ZOFRAN-ODT) 4 MG disintegrating tablet Take 4 mg by mouth every 8 (eight) hours as needed for nausea or vomiting.   Yes Historical Provider, MD  Polyethyl Glycol-Propyl Glycol (SYSTANE FREE OP) Place 1 drop into both eyes daily.   Yes Historical Provider, MD  potassium chloride (K-DUR) 10 MEQ tablet Take 1 tablet (10 mEq total) by mouth daily. 03/24/16 03/19/17 Yes Thayer Headings, MD  valsartan (DIOVAN) 80 MG tablet Take 1 tablet by mouth  daily 10/05/15  Yes Thayer Headings, MD  metFORMIN  (GLUCOPHAGE) 500 MG tablet Take 500 mg by mouth every evening. Pt takes 1000mg  in the morning and 500mg  in the evening    Historical Provider, MD    Family History Family History  Problem Relation Age of Onset  . Hypertension Mother   . Unexplained death Mother   . Throat cancer Sister   . Heart disease Brother   . Pneumonia Brother   . Heart disease Brother   . Prostate cancer Brother   . Unexplained death Brother     at birth  . Unexplained death Brother     Social History Social History  Substance Use Topics  . Smoking status: Former Smoker    Quit date: 08/01/1975  . Smokeless tobacco: Never Used  . Alcohol use No     Allergies   Codeine   Review  of Systems Review of Systems  Constitutional: Positive for appetite change and fatigue. Negative for chills and fever.  Respiratory: Negative for shortness of breath.   Cardiovascular: Positive for chest pain. Negative for leg swelling.  Gastrointestinal: Positive for abdominal distention, abdominal pain, diarrhea, nausea and vomiting. Negative for blood in stool.  Genitourinary: Negative for dysuria and urgency.  Neurological: Negative for light-headedness and headaches.  All other systems reviewed and are negative.    Physical Exam Updated Vital Signs BP 117/84   Pulse 79   Temp 98.1 F (36.7 C) (Oral)   Resp 21   SpO2 95%   Physical Exam  Constitutional: He appears well-developed and well-nourished. No distress.  HENT:  Head: Normocephalic and atraumatic.  Mouth/Throat: Mucous membranes are dry.  Eyes: Conjunctivae are normal. No scleral icterus.  Neck: Neck supple.  Cardiovascular: Normal rate and regular rhythm.   No murmur heard. Pulmonary/Chest: Effort normal and breath sounds normal. No respiratory distress. He has no wheezes. He has no rales.  Abdominal: Soft. He exhibits distension. There is tenderness (mild diffuse tenderness). There is no rebound and no guarding. No hernia.  Musculoskeletal: He  exhibits no edema.  Neurological: He is alert.  Skin: Skin is warm and dry.  Psychiatric: He has a normal mood and affect.  Nursing note and vitals reviewed.    ED Treatments / Results  Labs (all labs ordered are listed, but only abnormal results are displayed) Labs Reviewed  COMPREHENSIVE METABOLIC PANEL - Abnormal; Notable for the following:       Result Value   Chloride 98 (*)    Glucose, Bld 209 (*)    BUN 25 (*)    Creatinine, Ser 1.48 (*)    Calcium 8.8 (*)    Albumin 3.4 (*)    Total Bilirubin 1.3 (*)    GFR calc non Af Amer 43 (*)    GFR calc Af Amer 50 (*)    All other components within normal limits  CBC - Abnormal; Notable for the following:    WBC 11.0 (*)    RBC 5.88 (*)    MCV 75.3 (*)    MCH 25.3 (*)    All other components within normal limits  URINALYSIS, ROUTINE W REFLEX MICROSCOPIC - Abnormal; Notable for the following:    Color, Urine AMBER (*)    APPearance HAZY (*)    Bilirubin Urine SMALL (*)    Protein, ur 30 (*)    Squamous Epithelial / LPF 0-5 (*)    All other components within normal limits  I-STAT CG4 LACTIC ACID, ED - Abnormal; Notable for the following:    Lactic Acid, Venous 2.25 (*)    All other components within normal limits  LIPASE, BLOOD  I-STAT TROPOININ, ED  I-STAT CG4 LACTIC ACID, ED    EKG  EKG Interpretation  Date/Time:  Tuesday August 08 2016 16:59:53 EST Ventricular Rate:  82 PR Interval:    QRS Duration: 97 QT Interval:  379 QTC Calculation: 443 R Axis:   13 Text Interpretation:  Sinus rhythm Atrial premature complex Low voltage, precordial leads Nonspecific T abnormalities, lateral leads Confirmed by DELO  MD, DOUGLAS ([id number]) on 08/08/2016 5:18:08 PM       Radiology Ct Abdomen Pelvis W Contrast  Result Date: 08/08/2016 CLINICAL DATA:  Nausea, vomiting, abdominal pain and shortness of breath. History of abdominal aortic aneurysm, hypertension, hyperlipidemia, diabetes. EXAM: CT ABDOMEN AND PELVIS WITH CONTRAST  TECHNIQUE: Multidetector CT imaging of the abdomen and pelvis  was performed using the standard protocol following bolus administration of intravenous contrast. CONTRAST:  3mL ISOVUE-300 IOPAMIDOL (ISOVUE-300) INJECTION 61% COMPARISON:  CT abdomen and pelvis July 05, 2011 and abdominal aortic ultrasound May 05, 2016 FINDINGS: LOWER CHEST: LEFT lung base atelectasis/scarring. Heart size is normal. Severe coronary artery calcifications and/or stents. No pericardial effusions. Status post median sternotomy. HEPATOBILIARY: Liver and gallbladder are normal. PANCREAS: Normal. SPLEEN: Normal. ADRENALS/URINARY TRACT: Kidneys are orthotopic, demonstrating symmetric enhancement. No nephrolithiasis, hydronephrosis or solid renal masses. 16 mm RIGHT interpolar cyst. The unopacified ureters are normal in course and caliber. Delayed imaging through the kidneys demonstrates symmetric prompt contrast excretion within the proximal urinary collecting system. Urinary bladder is partially distended with mild circumferential wall thickening. STOMACH/BOWEL: Small hiatal hernia. The stomach, small and large bowel are normal in course and caliber without inflammatory changes. Fluid within small and cecum. Severe sigmoid diverticulosis. Additional scattered colonic diverticula. VASCULAR/LYMPHATIC: 3.4 x 3.5 cm infrarenal saccular aortic aneurysm, stable from prior ultrasound. Moderate calcific atherosclerosis. No lymphadenopathy by CT size criteria. REPRODUCTIVE: Prostatomegaly invading the base the urinary bladder. OTHER: No intraperitoneal free fluid or free air. MUSCULOSKELETAL: Nonacute. Small fat containing inguinal hernias. Multilevel severe degenerative disc disease lumbar spine. Moderate to severe L5-S1 neural foraminal narrowing. IMPRESSION: Fluid within small and large bowel suggest enteritis. Colonic diverticulosis without acute diverticulitis. Disproportion of bladder wall thickening, this may be secondary to chronic  bladder outlet obstruction or, cystitis. Stable 3.5 cm infrarenal aortic aneurysm. Recommend followup by ultrasound in 2 years. This recommendation follows ACR consensus guidelines: White Paper of the ACR Incidental Findings Committee II on Vascular Findings. J Am Coll Radiol 2013; 10:789-794. Electronically Signed   By: Elon Alas M.D.   On: 08/08/2016 17:41    Procedures Procedures (including critical care time)  Medications Ordered in ED Medications  0.9 %  sodium chloride infusion (not administered)  sodium chloride 0.9 % bolus 500 mL (0 mLs Intravenous Stopped 08/08/16 1730)  ondansetron (ZOFRAN) injection 4 mg (4 mg Intravenous Given 08/08/16 1653)  iopamidol (ISOVUE-300) 61 % injection (75 mLs  Contrast Given 08/08/16 1712)     Initial Impression / Assessment and Plan / ED Course  I have reviewed the triage vital signs and the nursing notes.  Pertinent labs & imaging results that were available during my care of the patient were reviewed by me and considered in my medical decision making (see chart for details).  Clinical Course    Patient is a 82 year old male with history of CHF, diabetes, hyperlipidemia, hypertension, CAD who presents with 1 week of nausea, vomiting, diarrhea and very poor by mouth intake. Patient has had chills but no documented fever at home.  On exam patient is mildly distended with mild generalized abdominal tenderness. Patient has no peritonitis. Patient is given normal saline bolus and Zofran. Patient states he has noticed green in his vomit and was felt more distended. Concern for possible SBO. CT scan obtained.  CT scan shows no signs of bowel obstruction however shows signs of enteritis. There is also significant diverticulosis w/o diverticulitis. He also has mild leukocytosis 11 K, creatinine of 1.4 which is up from his baseline of 1.1, and a mild lactic acidosis of 2.25.  Patient started on maintenance fluids and will recheck lactic acid as well.  Patient's nausea improved however had another episode of diarrhea in the ED.   Plan to admit to medicine for further hydration and treatment of enteritis. Starting cipro and flagyl in Ed.  Pt seen with  attending Dr. Stark Jock.    Final Clinical Impressions(s) / ED Diagnoses   Final diagnoses:  Enteritis  Dehydration  Lactic acidosis    New Prescriptions New Prescriptions   No medications on file     Tobie Poet, DO 08/08/16 1842    Veryl Speak, MD 08/08/16 1956

## 2016-08-08 NOTE — ED Triage Notes (Signed)
Pt states "i got sick Saturday before last, i'm achy all over, nausea, diarrhea, dry heaves, fevers chills, im vomiting really bad."

## 2016-08-08 NOTE — ED Notes (Signed)
MD at bedside. 

## 2016-08-08 NOTE — H&P (Signed)
History and Physical    BLAZEN GEORGIADIS [id number] DOB: June 04, 1935 DOA: 08/08/2016  Referring MD/NP/PA:   PCP: Leonard Downing, MD   Patient coming from:  The patient is coming from home.  At baseline, pt is independent for most of ADL.   Chief Complaint: Nausea, vomiting, diarrhea, abdominal pain, cough  HPI: Benjamin Harrison is a 82 y.o. male with medical history significant of hypertension, hyperlipidemia, GERD, hypothyroidism, dCHF, CAD, s/p of CABG, CKD-II, who presents with nausea, vomiting, diarrhea and abdominal pain, cough.  Patient states that he has been having intermittent nausea, vomiting, diarrhea and abdominal pain for about 10 days. He vomited once or twice today. He had 10 bowel movements with watery stool today. Patient has mild abdominal pain in central abdomen. He has subjective fever and chills.   He also reports cough and mild shortness of breath with greenish colored sputum production. He was seen in urgent care and started with azithromycin yesterday. Patient denies chest pain, symptoms of UTI, unilateral weakness.  ED Course: pt was found to have WBC 11.0, lactate 2.25 -->1.44, troponin negative, lipase is 16, worsening renal function, negative chest x-ray, temperature normal, oxygen saturation 95% on room air. Patient is placed on telemetry bed for observation.  # CT abdomen/pelvis showed: Possible enteritis. Colonic diverticulosis without acute diverticulitis. Disproportion of bladder wall thickening, this may be secondary to chronic bladder outlet obstruction or, cystitis. Stable 3.5 cm infrarenal aortic aneurysm. Recommend followup by ultrasound in 2 years.   Review of Systems:   General: no fevers, chills, no changes in body weight, has poor appetite, has fatigue HEENT: no blurry vision, hearing changes or sore throat Respiratory: has dyspnea, coughing, no wheezing CV: no chest pain, no palpitations GI: has nausea, vomiting, abdominal pain,  diarrhea, no constipation GU: no dysuria, burning on urination, increased urinary frequency, hematuria  Ext: no leg edema Neuro: no unilateral weakness, numbness, or tingling, no vision change or hearing loss Skin: no rash, no skin tear. MSK: No muscle spasm, no deformity, no limitation of range of movement in spin Heme: No easy bruising.  Travel history: No recent long distant travel.  Allergy:  Allergies  Allergen Reactions  . Codeine Nausea Only    Pt reports this happens off and on    Past Medical History:  Diagnosis Date  . CHF (congestive heart failure) ([place])   . Coronary artery disease    a. CABG 2001 with early failure of VG-RCA. b. Inf MI after Lexiscan 01/28/2009 s/p RCA stent. c. Stent to dRCA 02/24/09. d. DES to [place]  2012. e. NSTEMI s/p angioplasty for ISR of RCA 09/2011. f. 05/2012 Cath: LM 20-30p, LAD 100p, LCX min irregs, RCA patent prox stent, 31m ISR, VG->Diag nl w/ dzs in D1 prox to graft, VG->RCA 100, VG->OM nl, LIMA->LAD nl, Nl EF->Med Rx; g. 09/2014 low-risk MV.  Marland Kitchen History of echocardiogram    a. 09/2014 Echo: EF 55-60%, mild basal-midinferior HK, triv AI.  Marland Kitchen Hyperlipidemia   . Hypertension   . Hypertensive heart disease   . Hypothyroidism   . Osteoarthritis    a.End-stage osteoarthritis, right knee, medial compartment  . Personal history of tobacco use   . Type II diabetes mellitus ([place])     Past Surgical History:  Procedure Laterality Date  . CARDIAC CATHETERIZATION    . CARDIAC CATHETERIZATION N/A 12/06/2015   Procedure: Left Heart Cath and Cors/Grafts Angiography;  Surgeon: Burnell Blanks, MD;  Location: [place] [facility];  Service:  Cardiovascular;  Laterality: N/A;  . CARDIAC CATHETERIZATION N/A 12/06/2015   Procedure: Coronary Balloon Angioplasty;  Surgeon: Burnell Blanks, MD;  Location: [place] [facility];  Service: Cardiovascular;  Laterality: N/A;  . CORONARY ARTERY BYPASS GRAFT    . HERNIA REPAIR  1985  . KNEE ARTHROSCOPY  1999  and 2004  . LEFT HEART CATHETERIZATION WITH CORONARY ANGIOGRAM N/A 10/09/2011   Procedure: LEFT HEART CATHETERIZATION WITH CORONARY ANGIOGRAM;  Surgeon: Peter M Martinique, MD;  Location[facility];  Service: Cardiovascular;  Laterality: N/A;  . LEFT HEART CATHETERIZATION WITH CORONARY/GRAFT ANGIOGRAM N/A 06/06/2012   Procedure: LEFT HEART CATHETERIZATION WITH Beatrix Fetters;  Surgeon: Peter M Martinique, MD;  Location: [facility] CATH LAB;  Service: Cardiovascular;  Laterality: N/A;    Social History:  reports that he quit smoking about 41 years ago. He has never used smokeless tobacco. He reports that he does not drink alcohol or use drugs.  Family History:  Family History  Problem Relation Age of Onset  . Hypertension Mother   . Unexplained death Mother   . Throat cancer Sister   . Heart disease Brother   . Pneumonia Brother   . Heart disease Brother   . Prostate cancer Brother   . Unexplained death Brother     at birth  . Unexplained death Brother      Prior to Admission medications   Medication Sig Start Date End Date Taking? Authorizing Provider  aspirin 81 MG tablet Take 1 tablet (81 mg total) by mouth daily. 10/10/11  Yes Rogelia Mire, NP  atorvastatin (LIPITOR) 40 MG tablet TAKE 1 TABLET BY MOUTH  DAILY 04/28/16  Yes Thayer Headings, MD  azithromycin (ZITHROMAX) 250 MG tablet Take 250 mg by mouth daily. Started on 08-07-16 take 2 tablets by mouth today then take 1 tablet daily for 4 days   Yes Historical Provider, MD  clopidogrel (PLAVIX) 75 MG tablet TAKE 1 TABLET BY MOUTH  DAILY 04/28/16  Yes Thayer Headings, MD  Esomeprazole Magnesium (NEXIUM PO) Take 1 tablet by mouth daily. 22.3mg    Yes Historical Provider, MD  famotidine (PEPCID AC) 10 MG chewable tablet Chew 1 tablet (10 mg total) by mouth 2 (two) times daily as needed for heartburn. 03/28/16  Yes Thayer Headings, MD  glimepiride (AMARYL) 4 MG tablet Take 4 mg by mouth 2 (two) times daily.    Yes Historical Provider, MD    hydrochlorothiazide (HYDRODIURIL) 25 MG tablet Take 1 tablet (25 mg total) by mouth daily. 12/19/13  Yes Thayer Headings, MD  isosorbide mononitrate (IMDUR) 60 MG 24 hr tablet Take 1 tablet by mouth  daily 10/05/15  Yes Thayer Headings, MD  levothyroxine (SYNTHROID, LEVOTHROID) 100 MCG tablet Take 100 mcg by mouth daily.   Yes Historical Provider, MD  metFORMIN (GLUCOPHAGE) 1000 MG tablet Take by mouth daily with breakfast. Pt takes 1000mg  in the morning and 500mg  in the evening 03/13/14  Yes Historical Provider, MD  metoprolol tartrate (LOPRESSOR) 25 MG tablet TAKE 1 TABLET BY MOUTH TWO  TIMES DAILY 04/28/16  Yes Thayer Headings, MD  nitroGLYCERIN (NITROSTAT) 0.4 MG SL tablet Place 1 tablet (0.4 mg total) under the tongue every 5 (five) minutes as needed. For chest pain. 08/30/15  Yes Thayer Headings, MD  ondansetron (ZOFRAN-ODT) 4 MG disintegrating tablet Take 4 mg by mouth every 8 (eight) hours as needed for nausea or vomiting.   Yes Historical Provider, MD  Polyethyl Glycol-Propyl Glycol (SYSTANE FREE  OP) Place 1 drop into both eyes daily.   Yes Historical Provider, MD  potassium chloride (K-DUR) 10 MEQ tablet Take 1 tablet (10 mEq total) by mouth daily. 03/24/16 03/19/17 Yes Thayer Headings, MD  valsartan (DIOVAN) 80 MG tablet Take 1 tablet by mouth  daily 10/05/15  Yes Thayer Headings, MD  metFORMIN (GLUCOPHAGE) 500 MG tablet Take 500 mg by mouth every evening. Pt takes 1000mg  in the morning and 500mg  in the evening    Historical Provider, MD    Physical Exam: Vitals:   08/08/16 1930 08/08/16 2000 08/08/16 2030 08/08/16 2156  BP: 128/65 116/77 141/71 126/72  Pulse: 82 81 78 78  Resp: 14 17 12 16   Temp:    97.8 F (36.6 C)  TempSrc:    Oral  SpO2: 95% 97% 95% 98%  Weight:    74.9 kg (165 lb 3.2 oz)  Height:    5\' 6"  (1.676 m)   General: Not in acute distress HEENT:       Eyes: PERRL, EOMI, no scleral icterus.       ENT: No discharge from the ears and nose, no pharynx injection, no tonsillar  enlargement.        Neck: No JVD, no bruit, no mass felt. Heme: No neck lymph node enlargement. Cardiac: S1/S2, RRR, No murmurs, No gallops or rubs. Respiratory:  No rales, wheezing, rhonchi or rubs. GI: Soft, nondistended, has mild tender in central abdomen, no rebound pain, no organomegaly, BS present. GU: No hematuria Ext: No pitting leg edema bilaterally. 2+DP/PT pulse bilaterally. Musculoskeletal: No joint deformities, No joint redness or warmth, no limitation of ROM in spin. Skin: No rashes.  Neuro: Alert, oriented X3, cranial nerves II-XII grossly intact, moves all extremities normally.   Psych: Patient is not psychotic, no suicidal or hemocidal ideation.  Labs on Admission: I have personally reviewed following labs and imaging studies  CBC:  Recent Labs Lab 08/08/16 1041 08/09/16 0023  WBC 11.0* 9.1  HGB 14.9 12.3*  HCT 44.3 36.5*  MCV 75.3* 74.9*  PLT 248 [id number]   Basic Metabolic Panel:  Recent Labs Lab 08/08/16 1041 08/09/16 0023  NA 136 138  K 4.0 3.0*  CL 98* 102  CO2 25 26  GLUCOSE 209* 95  BUN 25* 23*  CREATININE 1.48* 1.43*  CALCIUM 8.8* 7.8*   GFR: Estimated Creatinine Clearance: 37.8 mL/min (by C-G formula based on SCr of 1.43 mg/dL (H)). Liver Function Tests:  Recent Labs Lab 08/08/16 1041  AST 25  ALT 25  ALKPHOS 65  BILITOT 1.3*  PROT 6.8  ALBUMIN 3.4*    Recent Labs Lab 08/08/16 1041  LIPASE 16   No results for input(s): AMMONIA in the last 168 hours. Coagulation Profile: No results for input(s): INR, PROTIME in the last 168 hours. Cardiac Enzymes: No results for input(s): CKTOTAL, CKMB, CKMBINDEX, TROPONINI in the last 168 hours. BNP (last 3 results) No results for input(s): PROBNP in the last 8760 hours. HbA1C: No results for input(s): HGBA1C in the last 72 hours. CBG:  Recent Labs Lab 08/08/16 2204  GLUCAP 118*   Lipid Profile: No results for input(s): CHOL, HDL, LDLCALC, TRIG, CHOLHDL, LDLDIRECT in the last 72  hours. Thyroid Function Tests: No results for input(s): TSH, T4TOTAL, FREET4, T3FREE, THYROIDAB in the last 72 hours. Anemia Panel: No results for input(s): VITAMINB12, FOLATE, FERRITIN, TIBC, IRON, RETICCTPCT in the last 72 hours. Urine analysis:    Component Value Date/Time   COLORURINE AMBER (A) 08/08/2016  [place] (A) 08/08/2016 1035   LABSPEC 1.026 08/08/2016 1035   PHURINE 5.0 08/08/2016 1035   GLUCOSEU NEGATIVE 08/08/2016 1035   HGBUR NEGATIVE 08/08/2016 1035   BILIRUBINUR SMALL (A) 08/08/2016 1035   KETONESUR NEGATIVE 08/08/2016 1035   PROTEINUR 30 (A) 08/08/2016 1035   NITRITE NEGATIVE 08/08/2016 1035   LEUKOCYTESUR NEGATIVE 08/08/2016 1035   Sepsis Labs: @LABRCNTIP (procalcitonin:4,lacticidven:4) )No results found for this or any previous visit (from the past 240 hour(s)).   Radiological Exams on Admission: Dg Chest 2 View  Result Date: 08/08/2016 CLINICAL DATA:  Cough nausea vomiting and diarrhea EXAM: CHEST  2 VIEW COMPARISON:  12/05/2015 FINDINGS: Median sternotomy changes are again evident. Streaky atelectasis or scar at the left base is unchanged. No acute consolidation or effusion. Stable cardiomediastinal silhouette. No pneumothorax. IMPRESSION: 1. Streaky atelectasis or scarring at the left base 2. No acute consolidation Electronically Signed   By: Donavan Foil M.D.   On: 08/08/2016 21:16   Ct Abdomen Pelvis W Contrast  Result Date: 08/08/2016 CLINICAL DATA:  Nausea, vomiting, abdominal pain and shortness of breath. History of abdominal aortic aneurysm, hypertension, hyperlipidemia, diabetes. EXAM: CT ABDOMEN AND PELVIS WITH CONTRAST TECHNIQUE: Multidetector CT imaging of the abdomen and pelvis was performed using the standard protocol following bolus administration of intravenous contrast. CONTRAST:  59mL ISOVUE-300 IOPAMIDOL (ISOVUE-300) INJECTION 61% COMPARISON:  CT abdomen and pelvis July 05, 2011 and abdominal aortic ultrasound May 05, 2016  FINDINGS: LOWER CHEST: LEFT lung base atelectasis/scarring. Heart size is normal. Severe coronary artery calcifications and/or stents. No pericardial effusions. Status post median sternotomy. HEPATOBILIARY: Liver and gallbladder are normal. PANCREAS: Normal. SPLEEN: Normal. ADRENALS/URINARY TRACT: Kidneys are orthotopic, demonstrating symmetric enhancement. No nephrolithiasis, hydronephrosis or solid renal masses. 16 mm RIGHT interpolar cyst. The unopacified ureters are normal in course and caliber. Delayed imaging through the kidneys demonstrates symmetric prompt contrast excretion within the proximal urinary collecting system. Urinary bladder is partially distended with mild circumferential wall thickening. STOMACH/BOWEL: Small hiatal hernia. The stomach, small and large bowel are normal in course and caliber without inflammatory changes. Fluid within small and cecum. Severe sigmoid diverticulosis. Additional scattered colonic diverticula. VASCULAR/LYMPHATIC: 3.4 x 3.5 cm infrarenal saccular aortic aneurysm, stable from prior ultrasound. Moderate calcific atherosclerosis. No lymphadenopathy by CT size criteria. REPRODUCTIVE: Prostatomegaly invading the base the urinary bladder. OTHER: No intraperitoneal free fluid or free air. MUSCULOSKELETAL: Nonacute. Small fat containing inguinal hernias. Multilevel severe degenerative disc disease lumbar spine. Moderate to severe L5-S1 neural foraminal narrowing. IMPRESSION: Fluid within small and large bowel suggest enteritis. Colonic diverticulosis without acute diverticulitis. Disproportion of bladder wall thickening, this may be secondary to chronic bladder outlet obstruction or, cystitis. Stable 3.5 cm infrarenal aortic aneurysm. Recommend followup by ultrasound in 2 years. This recommendation follows ACR consensus guidelines: White Paper of the ACR Incidental Findings Committee II on Vascular Findings. J Am Coll Radiol 2013; 10:789-794. Electronically Signed   By:  Elon Alas M.D.   On: 08/08/2016 17:41     EKG: Independently reviewed.  Sinus rhythm, QTC 443, PVC, poor R-wave progression, nonspecific T-wave change.  Assessment/Plan Principal Problem:   Gastroenteritis Active Problems:   Hypertension   Hypothyroidism   Hyperlipidemia   Diabetes mellitus (HCC)   GERD (gastroesophageal reflux disease)   Sepsis ([place])   Cough   Acute on chronic kidney failure-II   Gastroenteritis and sepsis: pt likely has a viral gastroenteritis, but cannot rule out other possibilities, such as a C. difficile colitis. Patient meets criteria  for sepsis with leukocytosis, elevated lactate, tachypnea, tachycardia. Hemodynamically stable. Lactic acid has normalized with IV fluid, 2.25-->1.11.   -will place on tele bed for obs -pt received 1 dose of Cipro and Flagyl in ED. -prn zofran  -check c diff pcr and GI path panel -will get Procalcitonin and trend lactic acid levels per sepsis protocol. -IVF: 500 of NS bolus in ED, followed by 100 cc/h   HTN:  -hold HCTZ due to sepsis -hold diovan due to worsening renal function -IV hydralazine when necessary  Hypothyroidism: Last TSH was 3.526 on 06/05/12 -Continue home Synthroid  HLD: Last LDL was 78 on 03/22/16 -Continue home medications:  lipitor  GERD: -Pepcid   DM-II: Last A1c 6.5 on 12/05/15, well controled. Patient is taking metformin and Amaryl at home -SSI  Acute on chronic kidney failure-II: Likely due to prerenal secondary to dehydration and continuation of ARB, diruetics - IVF as above - Check  FeUrea - Follow up renal function by BMP - Hold HCTZ and Diovan  Cough: CXR negative. Flu pcr negative. -Mucinex prn for cough - albuterol nebulizer when necessary     DVT ppx: SQ Lovenox Code Status: Full code Family Communication: None at bed side. Disposition Plan:  Anticipate discharge back to previous home environment Consults called:  none Admission status: Obs / tele   Date of Service  08/09/2016    Ivor Costa [facility] Pager [phone]  If 7PM-7AM, please contact night-coverage [email or web address] Password [email or web address] 08/09/2016, 3:42 AM

## 2016-08-08 NOTE — ED Notes (Signed)
Patient returned from CT

## 2016-08-08 NOTE — ED Notes (Signed)
Patient transported to CT 

## 2016-08-09 DIAGNOSIS — N179 Acute kidney failure, unspecified: Secondary | ICD-10-CM | POA: Diagnosis present

## 2016-08-09 DIAGNOSIS — K529 Noninfective gastroenteritis and colitis, unspecified: Secondary | ICD-10-CM | POA: Diagnosis not present

## 2016-08-09 DIAGNOSIS — K219 Gastro-esophageal reflux disease without esophagitis: Secondary | ICD-10-CM | POA: Diagnosis not present

## 2016-08-09 DIAGNOSIS — N189 Chronic kidney disease, unspecified: Secondary | ICD-10-CM

## 2016-08-09 DIAGNOSIS — I10 Essential (primary) hypertension: Secondary | ICD-10-CM | POA: Diagnosis not present

## 2016-08-09 LAB — INFLUENZA PANEL BY PCR (TYPE A & B)
INFLAPCR: NEGATIVE
INFLBPCR: NEGATIVE

## 2016-08-09 LAB — CBC
HCT: 36.5 % — ABNORMAL LOW (ref 39.0–52.0)
HEMOGLOBIN: 12.3 g/dL — AB (ref 13.0–17.0)
MCH: 25.3 pg — ABNORMAL LOW (ref 26.0–34.0)
MCHC: 33.7 g/dL (ref 30.0–36.0)
MCV: 74.9 fL — ABNORMAL LOW (ref 78.0–100.0)
Platelets: 203 10*3/uL (ref 150–400)
RBC: 4.87 MIL/uL (ref 4.22–5.81)
RDW: 14.6 % (ref 11.5–15.5)
WBC: 9.1 10*3/uL (ref 4.0–10.5)

## 2016-08-09 LAB — BASIC METABOLIC PANEL
ANION GAP: 10 (ref 5–15)
BUN: 23 mg/dL — ABNORMAL HIGH (ref 6–20)
CALCIUM: 7.8 mg/dL — AB (ref 8.9–10.3)
CO2: 26 mmol/L (ref 22–32)
Chloride: 102 mmol/L (ref 101–111)
Creatinine, Ser: 1.43 mg/dL — ABNORMAL HIGH (ref 0.61–1.24)
GFR, EST AFRICAN AMERICAN: 52 mL/min — AB (ref >60)
GFR, EST NON AFRICAN AMERICAN: 45 mL/min — AB (ref >60)
GLUCOSE: 95 mg/dL (ref 65–99)
Potassium: 3 mmol/L — ABNORMAL LOW (ref 3.5–5.1)
Sodium: 138 mmol/L (ref 135–145)

## 2016-08-09 LAB — GLUCOSE, CAPILLARY
GLUCOSE-CAPILLARY: 77 mg/dL (ref 65–99)
Glucose-Capillary: 73 mg/dL (ref 65–99)
Glucose-Capillary: 138 mg/dL — ABNORMAL HIGH (ref 65–99)
Glucose-Capillary: 117 mg/dL — ABNORMAL HIGH (ref 65–99)

## 2016-08-09 LAB — MAGNESIUM: MAGNESIUM: 1.4 mg/dL — AB (ref 1.7–2.4)

## 2016-08-09 LAB — LACTIC ACID, PLASMA: LACTIC ACID, VENOUS: 1.1 mmol/L (ref 0.5–1.9)

## 2016-08-09 LAB — CREATININE, URINE, RANDOM: Creatinine, Urine: 34.27 mg/dL

## 2016-08-09 MED ORDER — METRONIDAZOLE 500 MG PO TABS
500.0000 mg | ORAL_TABLET | Freq: Three times a day (TID) | ORAL | Status: DC
  Administered 2016-08-09 – 2016-08-10 (×4): 500 mg via ORAL
  Filled 2016-08-09 (×4): qty 1

## 2016-08-09 MED ORDER — SODIUM CHLORIDE 0.9 % IV SOLN
INTRAVENOUS | Status: AC
  Administered 2016-08-09: 13:00:00 via INTRAVENOUS

## 2016-08-09 MED ORDER — MAGNESIUM SULFATE 2 GM/50ML IV SOLN
2.0000 g | Freq: Once | INTRAVENOUS | Status: AC
  Administered 2016-08-09: 2 g via INTRAVENOUS
  Filled 2016-08-09: qty 50

## 2016-08-09 MED ORDER — CIPROFLOXACIN IN D5W 400 MG/200ML IV SOLN
400.0000 mg | Freq: Two times a day (BID) | INTRAVENOUS | Status: DC
  Administered 2016-08-09 – 2016-08-10 (×3): 400 mg via INTRAVENOUS
  Filled 2016-08-09 (×4): qty 200

## 2016-08-09 MED ORDER — POTASSIUM CHLORIDE CRYS ER 20 MEQ PO TBCR
40.0000 meq | EXTENDED_RELEASE_TABLET | Freq: Once | ORAL | Status: AC
  Administered 2016-08-09: 40 meq via ORAL
  Filled 2016-08-09: qty 2

## 2016-08-09 NOTE — Progress Notes (Signed)
PROGRESS NOTE    Benjamin Harrison  [id number] DOB: 1934/10/12 DOA: 08/08/2016 PCP: Leonard Downing, MD   Outpatient Specialists:     Brief Narrative:  Benjamin Harrison is a 82 y.o. male with medical history significant of hypertension, hyperlipidemia, GERD, hypothyroidism, dCHF, CAD, s/p of CABG, CKD-II, who presents with nausea, vomiting, diarrhea and abdominal pain, cough.  Patient states that he has been having intermittent nausea, vomiting, diarrhea and abdominal pain for about 10 days. He vomited once or twice today. He had 10 bowel movements with watery stool today. Patient has mild abdominal pain in central abdomen. He has subjective fever and chills.   He also reports cough and mild shortness of breath with greenish colored sputum production. He was seen in urgent care and started with azithromycin yesterday. Patient denies chest pain, symptoms of UTI, unilateral weakness.  ED Course: pt was found to have WBC 11.0, lactate 2.25 -->1.44, troponin negative, lipase is 16, worsening renal function, negative chest x-ray, temperature normal, oxygen saturation 95% on room air. Patient is placed on telemetry bed for observation.  # CT abdomen/pelvis showed: Possible enteritis. Colonic diverticulosis without acute diverticulitis. Disproportion of bladder wall thickening, this may be secondary to chronic bladder outlet obstruction or, cystitis. Stable 3.5 cm infrarenal aortic aneurysm. Recommend followup by ultrasound in 2 years.    Assessment & Plan:   Principal Problem:   Gastroenteritis Active Problems:   Hypertension   Hypothyroidism   Hyperlipidemia   Diabetes mellitus ([place])   GERD (gastroesophageal reflux disease)   Sepsis ([place])   Cough   Acute on chronic kidney failure-II   Gastroenteritis and sepsis:  -pt likely has a viral gastroenteritis, but cannot rule out other possibilities, such as a C. difficile colitis. -check c diff pcr and GI path  panel -clear diet, advance as tolerated -start cipro/flagyl for now  HTN:  -hold HCTZ due to sepsis -hold diovan due to worsening renal function -IV hydralazine when necessary  Hypothyroidism: Last TSH was 3.526 on 06/05/12 -Continue home Synthroid  HLD: Last LDL was 78 on 03/22/16 -Continue home medications:  lipitor  GERD: -Pepcid   DM-II: Last A1c 6.5 on 12/05/15, well controled.  -SSI while in hospital  Acute on chronic kidney failure-II: Likely due to prerenal secondary to dehydration and continuation of ARB, diruetics - IVF as above - Follow up renal function by BMP - Hold HCTZ and Diovan  Cough: CXR negative. Flu pcr negative. -Mucinex prn for cough -albuterol nebulizer when necessary   Hypokalemia -replace PO -check Mg   DVT prophylaxis:  Lovenox   Code Status: Full Code   Family Communication: Patient/wife  Disposition Plan:     Consultants:      Subjective: No stool since yesterday   Objective: Vitals:   08/08/16 2030 08/08/16 2156 08/09/16 0556 08/09/16 1023  BP: 141/71 126/72 110/69 100/63  Pulse: 78 78 76 68  Resp: 12 16 16    Temp:  97.8 F (36.6 C) 98.1 F (36.7 C)   TempSrc:  Oral Oral   SpO2: 95% 98% 96%   Weight:  74.9 kg (165 lb 3.2 oz)    Height:  5\' 6"  (1.676 m)      Intake/Output Summary (Last 24 hours) at 08/09/16 1143 Last data filed at 08/09/16 0900  Gross per 24 hour  Intake              500 ml  Output  425 ml  Net               75 ml   Filed Weights   08/08/16 2156  Weight: 74.9 kg (165 lb 3.2 oz)    Examination:  General exam: Appears calm and comfortable  Respiratory system: Clear to auscultation. Respiratory effort normal. Cardiovascular system: S1 & S2 heard, RRR. No JVD, murmurs, rubs, gallops or clicks. No pedal edema. Gastrointestinal system: tender in epigastric region Central nervous system: Alert and oriented. No focal neurological deficits.     Data Reviewed: I have  personally reviewed following labs and imaging studies  CBC:  Recent Labs Lab 08/08/16 1041 08/09/16 0023  WBC 11.0* 9.1  HGB 14.9 12.3*  HCT 44.3 36.5*  MCV 75.3* 74.9*  PLT 248 123456   Basic Metabolic Panel:  Recent Labs Lab 08/08/16 1041 08/09/16 0023  NA 136 138  K 4.0 3.0*  CL 98* 102  CO2 25 26  GLUCOSE 209* 95  BUN 25* 23*  CREATININE 1.48* 1.43*  CALCIUM 8.8* 7.8*   GFR: Estimated Creatinine Clearance: 37.8 mL/min (by C-G formula based on SCr of 1.43 mg/dL (H)). Liver Function Tests:  Recent Labs Lab 08/08/16 1041  AST 25  ALT 25  ALKPHOS 65  BILITOT 1.3*  PROT 6.8  ALBUMIN 3.4*    Recent Labs Lab 08/08/16 1041  LIPASE 16   No results for input(s): AMMONIA in the last 168 hours. Coagulation Profile: No results for input(s): INR, PROTIME in the last 168 hours. Cardiac Enzymes: No results for input(s): CKTOTAL, CKMB, CKMBINDEX, TROPONINI in the last 168 hours. BNP (last 3 results) No results for input(s): PROBNP in the last 8760 hours. HbA1C: No results for input(s): HGBA1C in the last 72 hours. CBG:  Recent Labs Lab 08/08/16 2204 08/09/16 0816  GLUCAP 118* 77   Lipid Profile: No results for input(s): CHOL, HDL, LDLCALC, TRIG, CHOLHDL, LDLDIRECT in the last 72 hours. Thyroid Function Tests: No results for input(s): TSH, T4TOTAL, FREET4, T3FREE, THYROIDAB in the last 72 hours. Anemia Panel: No results for input(s): VITAMINB12, FOLATE, FERRITIN, TIBC, IRON, RETICCTPCT in the last 72 hours. Urine analysis:    Component Value Date/Time   COLORURINE AMBER (A) 08/08/2016 1035   APPEARANCEUR HAZY (A) 08/08/2016 1035   LABSPEC 1.026 08/08/2016 1035   PHURINE 5.0 08/08/2016 1035   GLUCOSEU NEGATIVE 08/08/2016 1035   HGBUR NEGATIVE 08/08/2016 1035   BILIRUBINUR SMALL (A) 08/08/2016 1035   KETONESUR NEGATIVE 08/08/2016 1035   PROTEINUR 30 (A) 08/08/2016 1035   NITRITE NEGATIVE 08/08/2016 1035   LEUKOCYTESUR NEGATIVE 08/08/2016 1035      )No results found for this or any previous visit (from the past 240 hour(s)).    Anti-infectives    Start     Dose/Rate Route Frequency Ordered Stop   08/09/16 1400  metroNIDAZOLE (FLAGYL) tablet 500 mg     500 mg Oral Every 8 hours 08/09/16 1142     08/09/16 1145  ciprofloxacin (CIPRO) IVPB 400 mg     400 mg 200 mL/hr over 60 Minutes Intravenous Every 12 hours 08/09/16 1142     08/09/16 1000  azithromycin (ZITHROMAX) tablet 250 mg  Status:  Discontinued    Comments:  Started on 08-07-16 take 2 tablets by mouth today then take 1 tablet daily for 4 days     250 mg Oral Daily 08/08/16 2003 08/09/16 1142   08/08/16 1845  ciprofloxacin (CIPRO) IVPB 400 mg     400 mg 200 mL/hr  over 60 Minutes Intravenous  Once 08/08/16 1843 08/08/16 2228   08/08/16 1845  metroNIDAZOLE (FLAGYL) IVPB 500 mg     500 mg 100 mL/hr over 60 Minutes Intravenous  Once 08/08/16 1843 08/08/16 2059       Radiology Studies: Dg Chest 2 View  Result Date: 08/08/2016 CLINICAL DATA:  Cough nausea vomiting and diarrhea EXAM: CHEST  2 VIEW COMPARISON:  12/05/2015 FINDINGS: Median sternotomy changes are again evident. Streaky atelectasis or scar at the left base is unchanged. No acute consolidation or effusion. Stable cardiomediastinal silhouette. No pneumothorax. IMPRESSION: 1. Streaky atelectasis or scarring at the left base 2. No acute consolidation Electronically Signed   By: Donavan Foil M.D.   On: 08/08/2016 21:16   Ct Abdomen Pelvis W Contrast  Result Date: 08/08/2016 CLINICAL DATA:  Nausea, vomiting, abdominal pain and shortness of breath. History of abdominal aortic aneurysm, hypertension, hyperlipidemia, diabetes. EXAM: CT ABDOMEN AND PELVIS WITH CONTRAST TECHNIQUE: Multidetector CT imaging of the abdomen and pelvis was performed using the standard protocol following bolus administration of intravenous contrast. CONTRAST:  82mL ISOVUE-300 IOPAMIDOL (ISOVUE-300) INJECTION 61% COMPARISON:  CT abdomen and pelvis  July 05, 2011 and abdominal aortic ultrasound May 05, 2016 FINDINGS: LOWER CHEST: LEFT lung base atelectasis/scarring. Heart size is normal. Severe coronary artery calcifications and/or stents. No pericardial effusions. Status post median sternotomy. HEPATOBILIARY: Liver and gallbladder are normal. PANCREAS: Normal. SPLEEN: Normal. ADRENALS/URINARY TRACT: Kidneys are orthotopic, demonstrating symmetric enhancement. No nephrolithiasis, hydronephrosis or solid renal masses. 16 mm RIGHT interpolar cyst. The unopacified ureters are normal in course and caliber. Delayed imaging through the kidneys demonstrates symmetric prompt contrast excretion within the proximal urinary collecting system. Urinary bladder is partially distended with mild circumferential wall thickening. STOMACH/BOWEL: Small hiatal hernia. The stomach, small and large bowel are normal in course and caliber without inflammatory changes. Fluid within small and cecum. Severe sigmoid diverticulosis. Additional scattered colonic diverticula. VASCULAR/LYMPHATIC: 3.4 x 3.5 cm infrarenal saccular aortic aneurysm, stable from prior ultrasound. Moderate calcific atherosclerosis. No lymphadenopathy by CT size criteria. REPRODUCTIVE: Prostatomegaly invading the base the urinary bladder. OTHER: No intraperitoneal free fluid or free air. MUSCULOSKELETAL: Nonacute. Small fat containing inguinal hernias. Multilevel severe degenerative disc disease lumbar spine. Moderate to severe L5-S1 neural foraminal narrowing. IMPRESSION: Fluid within small and large bowel suggest enteritis. Colonic diverticulosis without acute diverticulitis. Disproportion of bladder wall thickening, this may be secondary to chronic bladder outlet obstruction or, cystitis. Stable 3.5 cm infrarenal aortic aneurysm. Recommend followup by ultrasound in 2 years. This recommendation follows ACR consensus guidelines: White Paper of the ACR Incidental Findings Committee II on Vascular Findings. J Am  Coll Radiol 2013; 10:789-794. Electronically Signed   By: Elon Alas M.D.   On: 08/08/2016 17:41        Scheduled Meds: . aspirin EC  81 mg Oral Daily  . atorvastatin  40 mg Oral Daily  . ciprofloxacin  400 mg Intravenous Q12H  . clopidogrel  75 mg Oral Daily  . enoxaparin (LOVENOX) injection  40 mg Subcutaneous Q24H  . guaiFENesin  600 mg Oral BID  . insulin aspart  0-5 Units Subcutaneous QHS  . insulin aspart  0-9 Units Subcutaneous TID WC  . isosorbide mononitrate  60 mg Oral Daily  . levothyroxine  100 mcg Oral Q breakfast  . metoprolol tartrate  25 mg Oral BID  . metroNIDAZOLE  500 mg Oral Q8H  . sodium chloride flush  3 mL Intravenous Q12H   Continuous Infusions: . sodium chloride  LOS: 0 days    Time spent: 25 min    [place], DO [facility] Pager [phone]  If 7PM-7AM, please contact night-coverage [email or web address] Password [email or web address] 08/09/2016, 11:43 AM

## 2016-08-10 DIAGNOSIS — I714 Abdominal aortic aneurysm, without rupture, unspecified: Secondary | ICD-10-CM

## 2016-08-10 DIAGNOSIS — R05 Cough: Secondary | ICD-10-CM

## 2016-08-10 DIAGNOSIS — N189 Chronic kidney disease, unspecified: Secondary | ICD-10-CM

## 2016-08-10 DIAGNOSIS — I10 Essential (primary) hypertension: Secondary | ICD-10-CM

## 2016-08-10 DIAGNOSIS — K219 Gastro-esophageal reflux disease without esophagitis: Secondary | ICD-10-CM

## 2016-08-10 DIAGNOSIS — N182 Chronic kidney disease, stage 2 (mild): Secondary | ICD-10-CM

## 2016-08-10 DIAGNOSIS — N179 Acute kidney failure, unspecified: Secondary | ICD-10-CM | POA: Diagnosis not present

## 2016-08-10 DIAGNOSIS — E1122 Type 2 diabetes mellitus with diabetic chronic kidney disease: Secondary | ICD-10-CM

## 2016-08-10 DIAGNOSIS — E785 Hyperlipidemia, unspecified: Secondary | ICD-10-CM

## 2016-08-10 DIAGNOSIS — E039 Hypothyroidism, unspecified: Secondary | ICD-10-CM

## 2016-08-10 DIAGNOSIS — K529 Noninfective gastroenteritis and colitis, unspecified: Secondary | ICD-10-CM

## 2016-08-10 LAB — CBC
HEMATOCRIT: 34.8 % — AB (ref 39.0–52.0)
Hemoglobin: 11.6 g/dL — ABNORMAL LOW (ref 13.0–17.0)
MCH: 24.9 pg — ABNORMAL LOW (ref 26.0–34.0)
MCHC: 33.3 g/dL (ref 30.0–36.0)
MCV: 74.8 fL — ABNORMAL LOW (ref 78.0–100.0)
Platelets: 188 10*3/uL (ref 150–400)
RBC: 4.65 MIL/uL (ref 4.22–5.81)
RDW: 14.6 % (ref 11.5–15.5)
WBC: 8.1 10*3/uL (ref 4.0–10.5)

## 2016-08-10 LAB — BASIC METABOLIC PANEL
ANION GAP: 8 (ref 5–15)
BUN: 12 mg/dL (ref 6–20)
CALCIUM: 8.1 mg/dL — AB (ref 8.9–10.3)
CO2: 24 mmol/L (ref 22–32)
CREATININE: 1.18 mg/dL (ref 0.61–1.24)
Chloride: 106 mmol/L (ref 101–111)
GFR, EST NON AFRICAN AMERICAN: 57 mL/min — AB (ref >60)
Glucose, Bld: 130 mg/dL — ABNORMAL HIGH (ref 65–99)
Potassium: 3.7 mmol/L (ref 3.5–5.1)
SODIUM: 138 mmol/L (ref 135–145)

## 2016-08-10 LAB — GLUCOSE, CAPILLARY
GLUCOSE-CAPILLARY: 125 mg/dL — AB (ref 65–99)
GLUCOSE-CAPILLARY: 148 mg/dL — AB (ref 65–99)
GLUCOSE-CAPILLARY: 113 mg/dL — AB (ref 65–99)

## 2016-08-10 LAB — UREA NITROGEN, URINE: UREA NITROGEN UR: 272 mg/dL

## 2016-08-10 MED ORDER — ACETAMINOPHEN 325 MG PO TABS: 650.0000 mg | ORAL_TABLET | Freq: Four times a day (QID) | ORAL | Status: DC | PRN

## 2016-08-10 MED ORDER — ONDANSETRON 4 MG PO TBDP: 4.0000 mg | ORAL_TABLET | Freq: Three times a day (TID) | ORAL | 0 refills | Status: DC | PRN

## 2016-08-10 MED ORDER — MAGNESIUM SULFATE 2 GM/50ML IV SOLN
2.0000 g | Freq: Once | INTRAVENOUS | Status: AC
  Administered 2016-08-10: 2 g via INTRAVENOUS
  Filled 2016-08-10: qty 50

## 2016-08-10 NOTE — Discharge Summary (Signed)
Physician Discharge Summary  FELIBERTO STOCKLEY EHO:[id number] DOB: May 22, 1935 DOA: 08/08/2016  PCP: Leonard Downing, MD  Admit date: 08/08/2016 Discharge date: 08/10/2016  Admitted From: Home Discharge disposition: Home   Recommendations for Outpatient Follow-Up:   1. Follow-up with PCP for nonresolution of symptoms. 2. Note: Incidentally discovered 3.5 cm AAA discovered. Will need a follow-up ultrasound in 2 years for surveillance.   Discharge Diagnosis:   Principal Problem:    Gastroenteritis with SIRS Active Problems:    Hypertension    Hypothyroidism    Hyperlipidemia    Diabetes mellitus ([facility])    GERD (gastroesophageal reflux disease)    R/O Sepsis ([place])    Cough    Acute on chronic kidney failure-II    AAA (abdominal aortic aneurysm) without rupture ([place])  Discharge Condition: Improved.  Diet recommendation: Low sodium, heart healthy.  Carbohydrate-modified.   History of Present Illness:   Benjamin Harrison is an 82 y.o. male with a PMH of hypertension, hyperlipidemia, GERD, hypothyroidism, diastolic CHF, CAD status post CABG, and stage II chronic kidney disease who was admitted for observation after he presented to the ED with a 10 day history of nausea, vomiting, diarrhea and abdominal pain.  Hospital Course by Problem:   Principal problem:  Gastroenteritis and SIRS, sepsis ruled out Symptoms likely from viral gastroenteritis. Although the patient  met criteria for sepsis with leukocytosis, elevated lactate, tachypnea, tachycardia, symptoms rapidly improved with hydration, so I suspect he had SIRS from dehydration rather than sepsis. He was given empiric Cipro/Flagyl in the ED. Stool studies were ordered but the patient has not had any further diarrhea. At this point, would discharge home and treat symptomatically. Diet advancement tolerated prior to discharge. No need for further antibiotics as this most likely is viral in etiology.  Active  problems:  HTN Diovan and HCTZ were held on admission. Renal function normalized with hydration. He has not had any further diarrhea or vomiting. Can resume his antihypertensives at discharge.  Hypothyroidism Last TSH was 3.526 on 06/05/12. Continue home Synthroid.  HLD Last LDL was 78 on 03/22/16. Continue Lipitor.  GERD Continue Pepcid/PPI.  DM-II Last A1c 6.5 on 12/05/15, well controled. Patient is taking metformin and Amaryl at home. Resume at discharge.  Acute on chronic kidney failure-II:  Creatinine back to baseline values with hydration and holding Diovan/HCTZ.  Cough CXR negative. Flu pcr negative. Stop azithromycin and treat symptomatically.  3.5 cm AAA Follow-up surveillance ultrasound in 2 years recommended.  Medical Consultants:    None.   Discharge Exam:   Vitals:   08/10/16 1119 08/10/16 1311  BP: 138/69 131/75  Pulse: (!) 58 67  Resp:  17  Temp: 97.6 F (36.4 C) 98 F (36.7 C)   Vitals:   08/10/16 0440 08/10/16 0817 08/10/16 1119 08/10/16 1311  BP: 110/66 94/69 138/69 131/75  Pulse: 63 74 (!) 58 67  Resp: 18   17  Temp: 97.9 F (36.6 C) 98.2 F (36.8 C) 97.6 F (36.4 C) 98 F (36.7 C)  TempSrc: Oral Oral Oral Oral  SpO2: 98%  97% 95%  Weight: 74.5 kg (164 lb 4.8 oz)     Height:        General exam: Appears calm and comfortable.  Respiratory system: Clear to auscultation. Respiratory effort normal. Cardiovascular system: S1 & S2 heard, RRR. No JVD,  rubs, gallops or clicks. No murmurs. Gastrointestinal system: Abdomen is nondistended, soft and nontender. No organomegaly or masses felt. Normal bowel sounds heard. Central nervous  system: Alert and oriented. No focal neurological deficits. Extremities: No clubbing,  or cyanosis. No edema. Skin: No rashes, lesions or ulcers. Psychiatry: Judgement and insight appear normal. Mood & affect appropriate.    The results of significant diagnostics from this hospitalization (including imaging,  microbiology, ancillary and laboratory) are listed below for reference.     Procedures and Diagnostic Studies:   Dg Chest 2 View  Result Date: 08/08/2016 CLINICAL DATA:  Cough nausea vomiting and diarrhea EXAM: CHEST  2 VIEW COMPARISON:  12/05/2015 FINDINGS: Median sternotomy changes are again evident. Streaky atelectasis or scar at the left base is unchanged. No acute consolidation or effusion. Stable cardiomediastinal silhouette. No pneumothorax. IMPRESSION: 1. Streaky atelectasis or scarring at the left base 2. No acute consolidation Electronically Signed   By: Donavan Foil M.D.   On: 08/08/2016 21:16   Ct Abdomen Pelvis W Contrast  Result Date: 08/08/2016 CLINICAL DATA:  Nausea, vomiting, abdominal pain and shortness of breath. History of abdominal aortic aneurysm, hypertension, hyperlipidemia, diabetes. EXAM: CT ABDOMEN AND PELVIS WITH CONTRAST TECHNIQUE: Multidetector CT imaging of the abdomen and pelvis was performed using the standard protocol following bolus administration of intravenous contrast. CONTRAST:  57m ISOVUE-300 IOPAMIDOL (ISOVUE-300) INJECTION 61% COMPARISON:  CT abdomen and pelvis July 05, 2011 and abdominal aortic ultrasound May 05, 2016 FINDINGS: LOWER CHEST: LEFT lung base atelectasis/scarring. Heart size is normal. Severe coronary artery calcifications and/or stents. No pericardial effusions. Status post median sternotomy. HEPATOBILIARY: Liver and gallbladder are normal. PANCREAS: Normal. SPLEEN: Normal. ADRENALS/URINARY TRACT: Kidneys are orthotopic, demonstrating symmetric enhancement. No nephrolithiasis, hydronephrosis or solid renal masses. 16 mm RIGHT interpolar cyst. The unopacified ureters are normal in course and caliber. Delayed imaging through the kidneys demonstrates symmetric prompt contrast excretion within the proximal urinary collecting system. Urinary bladder is partially distended with mild circumferential wall thickening. STOMACH/BOWEL: Small hiatal  hernia. The stomach, small and large bowel are normal in course and caliber without inflammatory changes. Fluid within small and cecum. Severe sigmoid diverticulosis. Additional scattered colonic diverticula. VASCULAR/LYMPHATIC: 3.4 x 3.5 cm infrarenal saccular aortic aneurysm, stable from prior ultrasound. Moderate calcific atherosclerosis. No lymphadenopathy by CT size criteria. REPRODUCTIVE: Prostatomegaly invading the base the urinary bladder. OTHER: No intraperitoneal free fluid or free air. MUSCULOSKELETAL: Nonacute. Small fat containing inguinal hernias. Multilevel severe degenerative disc disease lumbar spine. Moderate to severe L5-S1 neural foraminal narrowing. IMPRESSION: Fluid within small and large bowel suggest enteritis. Colonic diverticulosis without acute diverticulitis. Disproportion of bladder wall thickening, this may be secondary to chronic bladder outlet obstruction or, cystitis. Stable 3.5 cm infrarenal aortic aneurysm. Recommend followup by ultrasound in 2 years. This recommendation follows ACR consensus guidelines: White Paper of the ACR Incidental Findings Committee II on Vascular Findings. J Am Coll Radiol 2013; 10:789-794. Electronically Signed   By: CElon AlasM.D.   On: 08/08/2016 17:41     Labs:   Basic Metabolic Panel:  Recent Labs Lab 08/08/16 1041 08/09/16 0023 08/10/16 0429  NA 136 138 138  K 4.0 3.0* 3.7  CL 98* 102 106  CO2 _0 GLUCOSE 209* 95 130*  BUN 25* 23* 12  CREATININE 1.48* 1.43* 1.18  CALCIUM 8.8* 7.8* 8.1*  MG  --  1.4*  --    GFR Estimated Creatinine Clearance: 45.8 mL/min (by C-G formula based on SCr of 1.18 mg/dL). Liver Function Tests:  Recent Labs Lab 08/08/16 1041  AST 25  ALT 25  ALKPHOS 65  BILITOT 1.3*  PROT 6.8  ALBUMIN 3.4*  Recent Labs Lab 08/08/16 1041  LIPASE 16   No results for input(s): AMMONIA in the last 168 hours. Coagulation profile No results for input(s): INR, PROTIME in the last 168  hours.  CBC:  Recent Labs Lab 08/08/16 1041 08/09/16 0023 08/10/16 0429  WBC 11.0* 9.1 8.1  HGB 14.9 12.3* 11.6*  HCT 44.3 36.5* 34.8*  MCV 75.3* 74.9* 74.8*  PLT 248 203 188   Cardiac Enzymes: No results for input(s): CKTOTAL, CKMB, CKMBINDEX, TROPONINI in the last 168 hours. BNP: Invalid input(s): POCBNP CBG:  Recent Labs Lab 08/09/16 1149 08/09/16 1602 08/09/16 2136 08/10/16 0820 08/10/16 1116  GLUCAP 73 138* 117* 125* 148*   D-Dimer No results for input(s): DDIMER in the last 72 hours. Hgb A1c No results for input(s): HGBA1C in the last 72 hours. Lipid Profile No results for input(s): CHOL, HDL, LDLCALC, TRIG, CHOLHDL, LDLDIRECT in the last 72 hours. Thyroid function studies No results for input(s): TSH, T4TOTAL, T3FREE, THYROIDAB in the last 72 hours.  Invalid input(s): FREET3 Anemia work up No results for input(s): VITAMINB12, FOLATE, FERRITIN, TIBC, IRON, RETICCTPCT in the last 72 hours. Microbiology Recent Results (from the past 240 hour(s))  Culture, blood (x 2)     Status: None (Preliminary result)   Collection Time: 08/08/16  8:50 PM  Result Value Ref Range Status   Specimen Description BLOOD LEFT ANTECUBITAL  Final   Special Requests BOTTLES DRAWN AEROBIC AND ANAEROBIC 5CC  Final   Culture NO GROWTH 2 DAYS  Final   Report Status PENDING  Incomplete  Culture, blood (x 2)     Status: None (Preliminary result)   Collection Time: 08/08/16  8:56 PM  Result Value Ref Range Status   Specimen Description BLOOD LEFT HAND  Final   Special Requests BOTTLES DRAWN AEROBIC AND ANAEROBIC 5CC  Final   Culture NO GROWTH 2 DAYS  Final   Report Status PENDING  Incomplete     Discharge Instructions:   Discharge Instructions    Call MD for:  extreme fatigue    Complete by:  As directed    Call MD for:  persistant dizziness or light-headedness    Complete by:  As directed    Call MD for:  persistant nausea and vomiting    Complete by:  As directed    Diet -  low sodium heart healthy    Complete by:  As directed    Diet Carb Modified    Complete by:  As directed    Increase activity slowly    Complete by:  As directed      Allergies as of 08/10/2016      Reactions   Codeine Nausea Only   Pt reports this happens off and on      Medication List    STOP taking these medications   azithromycin 250 MG tablet Commonly known as:  ZITHROMAX     TAKE these medications   acetaminophen 325 MG tablet Commonly known as:  TYLENOL Take 2 tablets (650 mg total) by mouth every 6 (six) hours as needed for mild pain (or Fever >/= 101).   aspirin 81 MG tablet Take 1 tablet (81 mg total) by mouth daily.   atorvastatin 40 MG tablet Commonly known as:  LIPITOR TAKE 1 TABLET BY MOUTH  DAILY   clopidogrel 75 MG tablet Commonly known as:  PLAVIX TAKE 1 TABLET BY MOUTH  DAILY   famotidine 10 MG chewable tablet Commonly known as:  PEPCID AC Chew 1  tablet (10 mg total) by mouth 2 (two) times daily as needed for heartburn.   glimepiride 4 MG tablet Commonly known as:  AMARYL Take 4 mg by mouth 2 (two) times daily.   hydrochlorothiazide 25 MG tablet Commonly known as:  HYDRODIURIL Take 1 tablet (25 mg total) by mouth daily.   isosorbide mononitrate 60 MG 24 hr tablet Commonly known as:  IMDUR Take 1 tablet by mouth  daily   levothyroxine 100 MCG tablet Commonly known as:  SYNTHROID, LEVOTHROID Take 100 mcg by mouth daily.   metFORMIN 500 MG tablet Commonly known as:  GLUCOPHAGE Take 500 mg by mouth every evening. Pt takes 1017m in the morning and 5042min the evening   metFORMIN 1000 MG tablet Commonly known as:  GLUCOPHAGE Take by mouth daily with breakfast. Pt takes 100066mn the morning and 500m24m the evening   metoprolol tartrate 25 MG tablet Commonly known as:  LOPRESSOR TAKE 1 TABLET BY MOUTH TWO  TIMES DAILY   NEXIUM PO Take 1 tablet by mouth daily. 22.3mg 62mitroGLYCERIN 0.4 MG SL tablet Commonly known as:   NITROSTAT Place 1 tablet (0.4 mg total) under the tongue every 5 (five) minutes as needed. For chest pain.   ondansetron 4 MG disintegrating tablet Commonly known as:  ZOFRAN-ODT Take 1 tablet (4 mg total) by mouth every 8 (eight) hours as needed for nausea or vomiting.   potassium chloride 10 MEQ tablet Commonly known as:  K-DUR Take 1 tablet (10 mEq total) by mouth daily.   SYSTANE FREE OP Place 1 drop into both eyes daily.   valsartan 80 MG tablet Commonly known as:  DIOVAN Take 1 tablet by mouth  daily      Follow-up Information    ELKINLeonard Downing Schedule an appointment as soon as possible for a visit in 1 week(s).   Specialty:  Family Medicine Why:  Hospital follow up. Contact information: [phone]       Time coordinating discharge: 35 minutes.  Signed:  ,  Pager [phone] 08/10/2016, 4:39 PM

## 2016-08-10 NOTE — Care Management Obs Status (Signed)
[facility] NOTIFICATION   Patient Details  Name: TERYN WARZECHA MRN: [id number] Date of Birth: Dec 11, 1934   Medicare Observation Status Notification Given:  Yes    Bethena Roys, RN 08/10/2016, 3:06 PM

## 2016-08-10 NOTE — Care Management Note (Signed)
Case Management Note  Patient Details  Name: Benjamin Harrison MRN: [id number] Date of Birth: 1935-06-20  Subjective/Objective:    Pt presented for Gastroenteritis. Pt is from home with wife. Plan to return home 08-10-16 if can tolerate advanced diet. Pt occasionally uses a cane at home.                  Action/Plan: No needs identified by CM at this time.  Expected Discharge Date:                  Expected Discharge Plan:  Home/Self Care  In-House Referral:  NA  Discharge planning Services  NA  Post Acute Care Choice:  NA Choice offered to:  NA  DME Arranged:  N/A DME Agency:  NA  HH Arranged:  NA HH Agency:  NA  Status of Service:  Completed, signed off  If discussed at [place] of Stay Meetings, dates discussed:    Additional Comments:  Bethena Roys, RN 08/10/2016, 3:04 PM

## 2016-08-13 LAB — CULTURE, BLOOD (ROUTINE X 2)
Culture: NO GROWTH
Culture: NO GROWTH

## 2016-09-07 ENCOUNTER — Encounter: Payer: Self-pay | Admitting: Cardiovascular Disease

## 2016-09-08 ENCOUNTER — Other Ambulatory Visit: Payer: Self-pay | Admitting: Cardiovascular Disease

## 2016-09-12 ENCOUNTER — Ambulatory Visit (INDEPENDENT_AMBULATORY_CARE_PROVIDER_SITE_OTHER): Payer: Medicare Other | Admitting: Cardiovascular Disease

## 2016-09-12 ENCOUNTER — Encounter: Payer: Self-pay | Admitting: Cardiovascular Disease

## 2016-09-12 VITALS — BP 132/84 | HR 66 | Ht 66.0 in | Wt 170.0 lb

## 2016-09-12 DIAGNOSIS — I714 Abdominal aortic aneurysm, without rupture, unspecified: Secondary | ICD-10-CM

## 2016-09-12 DIAGNOSIS — I2510 Atherosclerotic heart disease of native coronary artery without angina pectoris: Secondary | ICD-10-CM

## 2016-09-12 NOTE — Patient Instructions (Addendum)
Medication Instructions:  STOP Nexium   Labwork: Your physician recommends that you return for lab work in: 6 months on the day of or a few days before your office visit with Dr. Acie Fredrickson.  You will need to FAST for this appointment - nothing to eat or drink after midnight the night before except water.   Testing/Procedures: None Ordered   Follow-Up: Your physician wants you to follow-up in: 6 months with Dr. Acie Fredrickson.  You will receive a reminder letter in the mail two months in advance. If you don't receive a letter, please call our office to schedule the follow-up appointment.   If you need a refill on your cardiac medications before your next appointment, please call your pharmacy.   Thank you for choosing [facility]! Christen Bame, RN [phone]

## 2016-09-12 NOTE — Progress Notes (Signed)
Cardiology Office Note   Date:  09/12/2016   ID:  Benjamin Harrison, DOB 16-Jul-1935, MRN [id number]  PCP:  Leonard Downing, MD  Cardiologist:   Mertie Moores, MD   No chief complaint on file.  1. Coronary artery disease: Status post CABG in2001. He status post stenting of his mid right coronary artery in 10/17/2010 2. Hypertension 3. Hypothyroidism 4. Hyperlipidemia 5. Diabetes mellitus  History of Present Illness:  Benjamin Harrison is a 82 year old gentleman with a history of coronary artery disease. He status post PTCA and stenting of his right coronary. He had repeat stenting of his right coronary artery as recently as March, 2012. He had another heart attack and had severe stenosis of his distal right coronary artery stent. Dr. Martinique opened up the stent in March, 2013. Saphenous vein graft to right coronary artery is occluded.  Since that time he's had persistent headaches and generalized weakness. He's continued to have some episodes of chest pain. He's also had some episodes of lightheadedness.  He's feeling a bit better since I saw him several months ago. He still is not able to exercise with about 15 or 20 minutes. Is clear that he still eats salty foods. He enjoys peanut butter on Ritz crackers almost every night. He also eats occasional country ham and gravy biscuits.  October 07, 2012  He is doing well. He had a cath in November, 2013. He has been on Imdur and is feeling better. He occasionally has a headache.   Nov. 24, 2014:  Benjamin Harrison is having more dyspnea with exertion recently - walking outside, taking a shower.  He avoids salt.  He has not had much if any chest pain.   Feb. 24, 2015: Benjamin Harrison is doing ok. He was having some dyspnea but this has resolved.   March 23, 2014: He is doing ok. He stopped taking his atorvastatin due to leg aches. His aches and pains are better since in the atorvastatin. He has lots of problems with arthritis and has  taken steroids. Staying active except as limited by his joints    Feb. 22, 2016: Benjamin Harrison is a 82 y.o. male who presents for follow up of his CAD. He has been having some chest tightness and pain.  Occurred at night.  Was very weak for 3 days. Was just getting ready for bed.   Radiated out to his shoulder.  Occasionally has exertional CP / last for several minutes.  No palpitations,  No dizziness  ,  Does have some orthostatic hypotension.   Dec 04, 2014: Benjamin Harrison was seen recently for some chest pain   Myoview shows: Overall Impression: There is a small area of moderate scar affecting the apical cap and the apical lateral segment. There is no ischemia. This is a low risk scan. The description of the study from December, 2014 is slightly different from this study. However, after careful review, I am not convinced that there has been any change. There is question of an area of insignificant scar near the apex. There is no ischemia.  LV Ejection Fraction: 58%. LV Wall Motion: Normal Wall Motion   Echo shows: Left ventricle: The cavity size was normal. There was mild focal basal hypertrophy of the septum. Systolic function was normal. The estimated ejection fraction was in the range of 55% to 60%. Probable mild hypokinesis of the basal-midinferior myocardium. Left ventricular diastolic function parameters were normal. - Aortic valve: There was trivial regurgitation.  Aug. 3, 2016:  Doing well.  No CP.  We added Imdur at his last visit and is feeling much better.   Has DOE still His CP and dyspnea are better on the Imdur   Jan. 30, 2017:  Doing well.  Occasional CP .  Takes NTG on occasion .  Perhaps once a month   Aug. 29, 2017:  Benjamin Harrison had PCI ( angiosculp cutting balloon ) of his mid and distal right coronary artery on 12/06/2015. Breathing better.   Has taken 1-2 NTG .   Staying active Works out at [employer] 3 days a week.    Gets tired quicker than he  wants to . Also wants to decrease his Nexium Advised Pepcid complete  Feb. 13, 2018:  Benjamin Harrison was hospitalized with gastroenteritis with SIRS in January. He has recovered nicely. He was found to have a small 3.5 cm abdominal aortic aneurysm incidentally.  Doing OK from a cardiac standpoint. Has some occasional chest discomfort .   Does not worsen with exertion .   He thinks it may be gas.    Past Medical History:  Diagnosis Date  . CHF (congestive heart failure) ([place])   . Coronary artery disease    a. CABG 2001 with early failure of VG-RCA. b. Inf MI after Lexiscan 01/28/2009 s/p RCA stent. c. Stent to dRCA 02/24/09. d. DES to [place]  2012. e. NSTEMI s/p angioplasty for ISR of RCA 09/2011. f. 05/2012 Cath: LM 20-30p, LAD 100p, LCX min irregs, RCA patent prox stent, 46m ISR, VG->Diag nl w/ dzs in D1 prox to graft, VG->RCA 100, VG->OM nl, LIMA->LAD nl, Nl EF->Med Rx; g. 09/2014 low-risk MV.  Marland Kitchen History of echocardiogram    a. 09/2014 Echo: EF 55-60%, mild basal-midinferior HK, triv AI.  Marland Kitchen Hyperlipidemia   . Hypertension   . Hypertensive heart disease   . Hypothyroidism   . Osteoarthritis    a.End-stage osteoarthritis, right knee, medial compartment  . Personal history of tobacco use   . Type II diabetes mellitus ([employer])     Past Surgical History:  Procedure Laterality Date  . CARDIAC CATHETERIZATION    . CARDIAC CATHETERIZATION N/A 12/06/2015   Procedure: Left Heart Cath and Cors/Grafts Angiography;  Surgeon: Burnell Blanks, MD;  Location: [place] [facility];  Service: Cardiovascular;  Laterality: N/A;  . CARDIAC CATHETERIZATION N/A 12/06/2015   Procedure: Coronary Balloon Angioplasty;  Surgeon: Burnell Blanks, MD;  Location: [place] [facility];  Service: Cardiovascular;  Laterality: N/A;  . CORONARY ARTERY BYPASS GRAFT    . HERNIA REPAIR  1985  . KNEE ARTHROSCOPY  1999 and 2004  . LEFT HEART CATHETERIZATION WITH CORONARY ANGIOGRAM N/A 10/09/2011   Procedure: LEFT HEART  CATHETERIZATION WITH CORONARY ANGIOGRAM;  Surgeon: Peter M Martinique, MD;  Location: [facility];  Service: Cardiovascular;  Laterality: N/A;  . LEFT HEART CATHETERIZATION WITH CORONARY/GRAFT ANGIOGRAM N/A 06/06/2012   Procedure: LEFT HEART CATHETERIZATION WITH Beatrix Fetters;  Surgeon: Peter M Martinique, MD;  Location: [facility] C[facility] LAB;  Service: Cardiovascular;  Laterality: N/A;     Current Outpatient Prescriptions  Medication Sig Dispense Refill  . aspirin 81 MG tablet Take 1 tablet (81 mg total) by mouth daily.    Marland Kitchen atorvastatin (LIPITOR) 40 MG tablet TAKE 1 TABLET BY MOUTH  DAILY 90 tablet 3  . clopidogrel (PLAVIX) 75 MG tablet TAKE 1 TABLET BY MOUTH  DAILY 90 tablet 3  . Esomeprazole Magnesium (NEXIUM PO) Take 1 tablet by mouth daily. 22.3mg     . famotidine (  PEPCID AC) 10 MG chewable tablet Chew 1 tablet (10 mg total) by mouth 2 (two) times daily as needed for heartburn.    Marland Kitchen glimepiride (AMARYL) 4 MG tablet Take 4 mg by mouth 2 (two) times daily.     . hydrochlorothiazide (HYDRODIURIL) 25 MG tablet Take 1 tablet (25 mg total) by mouth daily. 90 tablet 3  . isosorbide mononitrate (IMDUR) 60 MG 24 hr tablet TAKE 1 TABLET BY MOUTH  DAILY 90 tablet 1  . levothyroxine (SYNTHROID, LEVOTHROID) 100 MCG tablet Take 100 mcg by mouth daily.    . metFORMIN (GLUCOPHAGE) 500 MG tablet Take 500 mg by mouth every evening. Pt takes 1000mg  in the morning and 500mg  in the evening    . metoprolol tartrate (LOPRESSOR) 25 MG tablet TAKE 1 TABLET BY MOUTH TWO  TIMES DAILY 180 tablet 3  . nitroGLYCERIN (NITROSTAT) 0.4 MG SL tablet Place 1 tablet (0.4 mg total) under the tongue every 5 (five) minutes as needed. For chest pain. 25 tablet 6  . Polyethyl Glycol-Propyl Glycol (SYSTANE FREE OP) Place 1 drop into both eyes daily.    . potassium chloride (K-DUR) 10 MEQ tablet Take 1 tablet (10 mEq total) by mouth daily. 30 tablet 11  . valsartan (DIOVAN) 80 MG tablet TAKE 1 TABLET BY MOUTH  DAILY 90 tablet 1   No  current facility-administered medications for this visit.     Allergies:   Codeine    Social History:  The patient  reports that he quit smoking about 41 years ago. He has never used smokeless tobacco. He reports that he does not drink alcohol or use drugs.   Family History:  The patient's family history includes Heart disease in his brother and brother; Hypertension in his mother; Pneumonia in his brother; Prostate cancer in his brother; Throat cancer in his sister; Unexplained death in his brother, brother, and mother.    ROS:  Please see the history of present illness.    Review of Systems: Constitutional:  denies fever, chills, diaphoresis, appetite change and fatigue.  HEENT: denies photophobia, eye pain, redness, hearing loss, ear pain, congestion, sore throat, rhinorrhea, sneezing, neck pain, neck stiffness and tinnitus.  Respiratory: denies SOB, DOE, cough, chest tightness, and wheezing.  Cardiovascular: admits to chest pain,  , exertional CP   Gastrointestinal: denies nausea, vomiting, abdominal pain, diarrhea, constipation, blood in stool.  Genitourinary: denies dysuria, urgency, frequency, hematuria, flank pain and difficulty urinating.  Musculoskeletal: denies  myalgias, back pain, joint swelling, arthralgias and gait problem.   Skin: denies pallor, rash and wound.  Neurological: denies dizziness, seizures, syncope, weakness, light-headedness, numbness and headaches.   Hematological: denies adenopathy, easy bruising, personal or family bleeding history.  Psychiatric/ Behavioral: denies suicidal ideation, mood changes, confusion, nervousness, sleep disturbance and agitation.       All other systems are reviewed and negative.    PHYSICAL EXAM: VS:  BP 132/84   Pulse 66   Ht 5\' 6"  (1.676 m)   Wt 170 lb (77.1 kg)   BMI 27.44 kg/m  , BMI Body mass index is 27.44 kg/m. GEN: Well nourished, well developed, in no acute distress  HEENT: normal  Neck: no JVD, carotid  bruits, or masses Cardiac: RRR; no murmurs, rubs, or gallops,no edema  Respiratory:  clear to auscultation bilaterally, normal work of breathing GI: soft, nontender, nondistended, + BS MS: no deformity or atrophy  Skin: warm and dry, no rash Neuro:  Strength and sensation are intact Psych: normal  EKG:  EKG is ordered today. Sinus brady 56.   Otherwise normal .    Recent Labs: 08/08/2016: ALT 25; B Natriuretic Peptide 66.0 08/09/2016: Magnesium 1.4 08/10/2016: BUN 12; Creatinine, Ser 1.18; Hemoglobin 11.6; Platelets 188; Potassium 3.7; Sodium 138    Lipid Panel    Component Value Date/Time   CHOL 134 03/22/2016 0959   TRIG 114 03/22/2016 0959   HDL 33 (L) 03/22/2016 0959   CHOLHDL 4.1 03/22/2016 0959   VLDL 23 03/22/2016 0959   LDLCALC 78 03/22/2016 0959      Wt Readings from Last 3 Encounters:  09/12/16 170 lb (77.1 kg)  08/10/16 164 lb 4.8 oz (74.5 kg)  03/28/16 170 lb 12.8 oz (77.5 kg)      Other studies Reviewed: Additional studies/ records that were reviewed today include: . Review of the above records demonstrates:    ASSESSMENT AND PLAN:  1. Coronary artery disease: Status post CABG in2001.  He is s/p cutting balloon of his mid and distal RCA.  Continue plavix. Will DC Nexium and have him take pepcid complete or other H2 blockers.    2. Hypertension - BP is well controlled.   3. Hypothyroidism -  Followed by his medical doctor   4. Hyperlipidemia - will check lipids in 6 months .  5. Diabetes mellitus   Current medicines are reviewed at length with the patient today.  The patient does not have concerns regarding medicines.  The following changes have been made:  See above.    Disposition:   FU with me in 6 months    Signed, Mertie Moores, MD  09/12/2016 8:27 AM    [facility] [address], [place], [place]  [address] Phone: [phone]; Fax: [phone]

## 2017-02-27 ENCOUNTER — Encounter: Payer: Self-pay | Admitting: Nurse Practitioner

## 2017-02-27 ENCOUNTER — Telehealth: Payer: Self-pay | Admitting: Cardiovascular Disease

## 2017-02-27 MED ORDER — IRBESARTAN 75 MG PO TABS: 75.0000 mg | ORAL_TABLET | Freq: Every day | ORAL | 3 refills | Status: DC

## 2017-02-27 NOTE — Telephone Encounter (Signed)
Spoke with patient's wife and advised her of the recommended medication change for patient to stop valsartan and start irbesartan 75 mg once daily. She asked about the cost of the medication and I advised her to that she will need to call the pharmacy to find out her specific cost. I advised her to call back with questions or concerns. She verbalized understanding and agreement and thanked me for the call.

## 2017-02-27 NOTE — Telephone Encounter (Signed)
°  Pt c/o medication issue:  1. Name of Medication: valstatin 2. How are you currently taking this medication (dosage and times per day)?  80mg  1xday  3. Are you having a reaction (difficulty breathing--STAT)? no  4. What is your medication issue? recall

## 2017-02-27 NOTE — Telephone Encounter (Signed)
Recommend switching pt to irbesartan 75mg  daily and for pt to monitor BP over the next few weeks to ensure they remain stable.

## 2017-03-03 ENCOUNTER — Other Ambulatory Visit: Payer: Self-pay | Admitting: Cardiovascular Disease

## 2017-03-17 ENCOUNTER — Other Ambulatory Visit: Payer: Self-pay | Admitting: Cardiovascular Disease

## 2017-04-11 ENCOUNTER — Other Ambulatory Visit: Payer: Self-pay | Admitting: Cardiovascular Disease

## 2017-04-12 ENCOUNTER — Other Ambulatory Visit: Payer: Self-pay | Admitting: *Deleted

## 2017-04-12 MED ORDER — POTASSIUM CHLORIDE ER 10 MEQ PO TBCR: 10.0000 meq | EXTENDED_RELEASE_TABLET | Freq: Every day | ORAL | 4 refills | Status: DC

## 2017-04-27 ENCOUNTER — Other Ambulatory Visit: Payer: Self-pay | Admitting: Cardiovascular Disease

## 2017-04-27 ENCOUNTER — Encounter: Payer: Self-pay | Admitting: Cardiovascular Disease

## 2017-04-27 ENCOUNTER — Ambulatory Visit (INDEPENDENT_AMBULATORY_CARE_PROVIDER_SITE_OTHER): Payer: Medicare Other | Admitting: Cardiovascular Disease

## 2017-04-27 VITALS — BP 142/78 | HR 73 | Resp 16 | Ht 65.0 in | Wt 171.4 lb

## 2017-04-27 DIAGNOSIS — I10 Essential (primary) hypertension: Secondary | ICD-10-CM

## 2017-04-27 DIAGNOSIS — I2510 Atherosclerotic heart disease of native coronary artery without angina pectoris: Secondary | ICD-10-CM | POA: Diagnosis not present

## 2017-04-27 DIAGNOSIS — I739 Peripheral vascular disease, unspecified: Secondary | ICD-10-CM | POA: Diagnosis not present

## 2017-04-27 LAB — BASIC METABOLIC PANEL
BUN / CREAT RATIO: 16 (ref 10–24)
BUN: 18 mg/dL (ref 8–27)
CHLORIDE: 101 mmol/L (ref 96–106)
CO2: 25 mmol/L (ref 20–29)
Calcium: 9.5 mg/dL (ref 8.6–10.2)
Creatinine, Ser: 1.16 mg/dL (ref 0.76–1.27)
GFR calc Af Amer: 68 mL/min/{1.73_m2} (ref >59)
GFR calc non Af Amer: 59 mL/min/{1.73_m2} — ABNORMAL LOW (ref >59)
GLUCOSE: 122 mg/dL — AB (ref 65–99)
Potassium: 4.5 mmol/L (ref 3.5–5.2)
SODIUM: 141 mmol/L (ref 134–144)

## 2017-04-27 LAB — LIPID PANEL
Chol/HDL Ratio: 4.4 ratio (ref 0.0–5.0)
Cholesterol, Total: 114 mg/dL (ref 100–199)
HDL: 26 mg/dL — AB (ref >39)
LDL CALC: 54 mg/dL (ref 0–99)
Triglycerides: 168 mg/dL — ABNORMAL HIGH (ref 0–149)
VLDL CHOLESTEROL CAL: 34 mg/dL (ref 5–40)

## 2017-04-27 LAB — HEPATIC FUNCTION PANEL
ALK PHOS: 88 IU/L (ref 39–117)
ALT: 23 IU/L (ref 0–44)
AST: 26 IU/L (ref 0–40)
Albumin: 4 g/dL (ref 3.5–4.7)
Bilirubin Total: 0.9 mg/dL (ref 0.0–1.2)
Bilirubin, Direct: 0.21 mg/dL (ref 0.00–0.40)
TOTAL PROTEIN: 6.8 g/dL (ref 6.0–8.5)

## 2017-04-27 MED ORDER — NITROGLYCERIN 0.4 MG SL SUBL: 0.4000 mg | SUBLINGUAL_TABLET | SUBLINGUAL | 6 refills | Status: DC | PRN

## 2017-04-27 NOTE — Progress Notes (Signed)
Cardiology Office Note   Date:  04/27/2017   ID:  JAKSEN FIORELLA, DOB 1934-09-19, MRN [id number]  PCP:  Leonard Downing, MD  Cardiologist:   Mertie Moores, MD   Chief Complaint  Patient presents with  . Coronary Artery Disease   1. Coronary artery disease: Status post CABG in2001. He status post stenting of his mid right coronary artery in 10/17/2010 2. Hypertension 3. Hypothyroidism 4. Hyperlipidemia 5. Diabetes mellitus  History of Present Illness:  Alexiz is a 82 year old gentleman with a history of coronary artery disease. He status post PTCA and stenting of his right coronary. He had repeat stenting of his right coronary artery as recently as March, 2012. He had another heart attack and had severe stenosis of his distal right coronary artery stent. Dr. Martinique opened up the stent in March, 2013. Saphenous vein graft to right coronary artery is occluded.  Since that time he's had persistent headaches and generalized weakness. He's continued to have some episodes of chest pain. He's also had some episodes of lightheadedness.  He's feeling a bit better since I saw him several months ago. He still is not able to exercise with about 15 or 20 minutes. Is clear that he still eats salty foods. He enjoys peanut butter on Ritz crackers almost every night. He also eats occasional country ham and gravy biscuits.  October 07, 2012  He is doing well. He had a cath in November, 2013. He has been on Imdur and is feeling better. He occasionally has a headache.   Nov. 24, 2014:  Priscilla is having more dyspnea with exertion recently - walking outside, taking a shower.  He avoids salt.  He has not had much if any chest pain.   Feb. 24, 2015: Allante is doing ok. He was having some dyspnea but this has resolved.   March 23, 2014: He is doing ok. He stopped taking his atorvastatin due to leg aches. His aches and pains are better since in the atorvastatin. He has  lots of problems with arthritis and has taken steroids. Staying active except as limited by his joints    Feb. 22, 2016: MALONE VANBLARCOM is a 82 y.o. male who presents for follow up of his CAD. He has been having some chest tightness and pain.  Occurred at night.  Was very weak for 3 days. Was just getting ready for bed.   Radiated out to his shoulder.  Occasionally has exertional CP / last for several minutes.  No palpitations,  No dizziness  ,  Does have some orthostatic hypotension.   Dec 04, 2014: Edgel was seen recently for some chest pain   Myoview shows: Overall Impression: There is a small area of moderate scar affecting the apical cap and the apical lateral segment. There is no ischemia. This is a low risk scan. The description of the study from December, 2014 is slightly different from this study. However, after careful review, I am not convinced that there has been any change. There is question of an area of insignificant scar near the apex. There is no ischemia.  LV Ejection Fraction: 58%. LV Wall Motion: Normal Wall Motion   Echo shows: Left ventricle: The cavity size was normal. There was mild focal basal hypertrophy of the septum. Systolic function was normal. The estimated ejection fraction was in the range of 55% to 60%. Probable mild hypokinesis of the basal-midinferior myocardium. Left ventricular diastolic function parameters were normal. - Aortic valve: There was trivial  regurgitation.  Aug. 3, 2016:  Doing well. No CP.  We added Imdur at his last visit and is feeling much better.   Has DOE still His CP and dyspnea are better on the Imdur   Jan. 30, 2017:  Doing well.  Occasional CP .  Takes NTG on occasion .  Perhaps once a month   Aug. 29, 2017:  Herchel had PCI ( angiosculp cutting balloon ) of his mid and distal right coronary artery on 12/06/2015. Breathing better.   Has taken 1-2 NTG .   Staying active Works out at [employer] 3 days a  week.    Gets tired quicker than he wants to . Also wants to decrease his Nexium Advised Pepcid complete  Feb. 13, 2018:  Bransyn was hospitalized with gastroenteritis with SIRS in January. He has recovered nicely. He was found to have a small 3.5 cm abdominal aortic aneurysm incidentally.  Doing OK from a cardiac standpoint. Has some occasional chest discomfort .   Does not worsen with exertion .   He thinks it may be gas.    04/27/2017: Hurts is seen today for follow-up of his coronary artery disease, hypertension, hyperlipidemia.  C/o right leg pain and burning. Feels cold .  Worse with exertion, better with rest. Has been there for 3 months  Hs some chest pain .   Rest and exertion . Takes NTG Has some DOE  Past Medical History:  Diagnosis Date  . CHF (congestive heart failure) ([place])   . Coronary artery disease    a. CABG 2001 with early failure of VG-RCA. b. Inf MI after Lexiscan 01/28/2009 s/p RCA stent. c. Stent to dRCA 02/24/09. d. DES to [place]  2012. e. NSTEMI s/p angioplasty for ISR of RCA 09/2011. f. 05/2012 Cath: LM 20-30p, LAD 100p, LCX min irregs, RCA patent prox stent, 67m ISR, VG->Diag nl w/ dzs in D1 prox to graft, VG->RCA 100, VG->OM nl, LIMA->LAD nl, Nl EF->Med Rx; g. 09/2014 low-risk MV.  Marland Kitchen History of echocardiogram    a. 09/2014 Echo: EF 55-60%, mild basal-midinferior HK, triv AI.  Marland Kitchen Hyperlipidemia   . Hypertension   . Hypertensive heart disease   . Hypothyroidism   . Osteoarthritis    a.End-stage osteoarthritis, right knee, medial compartment  . Personal history of tobacco use   . Type II diabetes mellitus ([place])     Past Surgical History:  Procedure Laterality Date  . CARDIAC CATHETERIZATION    . CARDIAC CATHETERIZATION N/A 12/06/2015   Procedure: Left Heart Cath and Cors/Grafts Angiography;  Surgeon: Burnell Blanks, MD;  Location: [facility];  Service: Cardiovascular;  Laterality: N/A;  . CARDIAC CATHETERIZATION N/A 12/06/2015    Procedure: Coronary Balloon Angioplasty;  Surgeon: Burnell Blanks, MD;  Location: [facility];  Service: Cardiovascular;  Laterality: N/A;  . CORONARY ARTERY BYPASS GRAFT    . HERNIA REPAIR  1985  . KNEE ARTHROSCOPY  1999 and 2004  . LEFT HEART CATHETERIZATION WITH CORONARY ANGIOGRAM N/A 10/09/2011   Procedure: LEFT HEART CATHETERIZATION WITH CORONARY ANGIOGRAM;  Surgeon: Peter M Martinique, MD;  Location: [facility] CATH LAB;  Service: Cardiovascular;  Laterality: N/A;  . LEFT HEART CATHETERIZATION WITH CORONARY/GRAFT ANGIOGRAM N/A 06/06/2012   Procedure: LEFT HEART CATHETERIZATION WITH Beatrix Fetters;  Surgeon: Peter M Martinique, MD;  Location: [facility] CATH LAB;  Service: Cardiovascular;  Laterality: N/A;     Current Outpatient Prescriptions  Medication Sig Dispense Refill  . aspirin 81 MG tablet  Take 1 tablet (81 mg total) by mouth daily.    Marland Kitchen atorvastatin (LIPITOR) 40 MG tablet TAKE 1 TABLET BY MOUTH  DAILY 90 tablet 3  . clopidogrel (PLAVIX) 75 MG tablet TAKE 1 TABLET BY MOUTH  DAILY 90 tablet 3  . famotidine (PEPCID AC) 10 MG chewable tablet Chew 1 tablet (10 mg total) by mouth 2 (two) times daily as needed for heartburn.    Marland Kitchen glimepiride (AMARYL) 4 MG tablet Take 4 mg by mouth 2 (two) times daily.     . hydrochlorothiazide (HYDRODIURIL) 25 MG tablet Take 1 tablet (25 mg total) by mouth daily. 90 tablet 3  . irbesartan (AVAPRO) 75 MG tablet Take 1 tablet (75 mg total) by mouth daily. 90 tablet 3  . isosorbide mononitrate (IMDUR) 60 MG 24 hr tablet TAKE 1 TABLET BY MOUTH  DAILY 90 tablet 1  . levothyroxine (SYNTHROID, LEVOTHROID) 100 MCG tablet Take 100 mcg by mouth daily.    . metFORMIN (GLUCOPHAGE) 500 MG tablet Take 500 mg by mouth every evening. Pt takes 1000mg  in the morning and 500mg  in the evening    . metoprolol tartrate (LOPRESSOR) 25 MG tablet TAKE 1 TABLET BY MOUTH TWO  TIMES DAILY 180 tablet 3  . nitroGLYCERIN (NITROSTAT) 0.4 MG SL tablet Place 1 tablet (0.4 mg total)  under the tongue every 5 (five) minutes as needed. For chest pain. 25 tablet 6  . Polyethyl Glycol-Propyl Glycol (SYSTANE FREE OP) Place 1 drop into both eyes daily.    . potassium chloride (K-DUR) 10 MEQ tablet Take 1 tablet (10 mEq total) by mouth daily. 30 tablet 4   No current facility-administered medications for this visit.     Allergies:   Codeine    Social History:  The patient  reports that he quit smoking about 41 years ago. He has never used smokeless tobacco. He reports that he does not drink alcohol or use drugs.   Family History:  The patient's family history includes Heart disease in his brother and brother; Hypertension in his mother; Pneumonia in his brother; Prostate cancer in his brother; Throat cancer in his sister; Unexplained death in his brother, brother, and mother.    ROS:  Please see the history of present illness.    Review of Systems: Constitutional:  denies fever, chills, diaphoresis, appetite change and fatigue.  HEENT: denies photophobia, eye pain, redness, hearing loss, ear pain, congestion, sore throat, rhinorrhea, sneezing, neck pain, neck stiffness and tinnitus.  Respiratory: denies SOB, DOE, cough, chest tightness, and wheezing.  Cardiovascular: admits to chest pain,  , exertional CP   Gastrointestinal: denies nausea, vomiting, abdominal pain, diarrhea, constipation, blood in stool.  Genitourinary: denies dysuria, urgency, frequency, hematuria, flank pain and difficulty urinating.  Musculoskeletal: denies  myalgias, back pain, joint swelling, arthralgias and gait problem.   Skin: denies pallor, rash and wound.  Neurological: denies dizziness, seizures, syncope, weakness, light-headedness, numbness and headaches.   Hematological: denies adenopathy, easy bruising, personal or family bleeding history.  Psychiatric/ Behavioral: denies suicidal ideation, mood changes, confusion, nervousness, sleep disturbance and agitation.       All other systems are  reviewed and negative.   Physical Exam: Blood pressure (!) 142/78, pulse 73, resp. rate 16, height 5\' 5"  (1.651 m), weight 171 lb 6.4 oz (77.7 kg), SpO2 97 %.  GEN:  Well nourished, well developed in no acute distress HEENT: Normal NECK: No JVD; No carotid bruits LYMPHATICS: No lymphadenopathy CARDIAC: RRR , no murmurs, rubs, gallops RESPIRATORY:  Clear to auscultation without rales, wheezing or rhonchi  ABDOMEN: Soft, non-tender, non-distended MUSCULOSKELETAL:  No edema; No deformity , distal foot pulses are difficult to palpate.  SKIN: Warm and dry NEUROLOGIC:  Alert and oriented x 3    EKG:  EKG is not ordered today.  Recent Labs: 08/08/2016: ALT 25; B Natriuretic Peptide 66.0 08/09/2016: Magnesium 1.4 08/10/2016: BUN 12; Creatinine, Ser 1.18; Hemoglobin 11.6; Platelets 188; Potassium 3.7; Sodium 138    Lipid Panel    Component Value Date/Time   CHOL 134 03/22/2016 0959   TRIG 114 03/22/2016 0959   HDL 33 (L) 03/22/2016 0959   CHOLHDL 4.1 03/22/2016 0959   VLDL 23 03/22/2016 0959   LDLCALC 78 03/22/2016 0959      Wt Readings from Last 3 Encounters:  04/27/17 171 lb 6.4 oz (77.7 kg)  09/12/16 170 lb (77.1 kg)  08/10/16 164 lb 4.8 oz (74.5 kg)      Other studies Reviewed: Additional studies/ records that were reviewed today include: . Review of the above records demonstrates:    ASSESSMENT AND PLAN:  1. Coronary artery disease:  He has an occluded graft to the right coronary artery and the native right coronary artery.  He has occasional episodes of angina and takes nitroglycerin. He's been taking his isosorbide at night. We'll try taking it in the morning or perhaps taking one half tablet twice a day to see if this helps with some of his daytime chest pain.  2. Hypertension - blood pressures well-controlled.  3. Hypothyroidism - followed by his primary medical doctor  4. Hyperlipidemia - check labs today.  5. Diabetes mellitus   Current medicines are  reviewed at length with the patient today.  The patient does not have concerns regarding medicines.  The following changes have been made:  See above.    Disposition:   FU with me in 6 months    Signed, Mertie Moores, MD  04/27/2017 8:17 AM    [facility] [address], [place], [place]  [address] Phone: [phone]; Fax: [phone]

## 2017-04-27 NOTE — Patient Instructions (Signed)
Medication Instructions:  Your physician recommends that you continue on your current medications as directed. Please refer to the Current Medication list given to you today.   Labwork: TODAY - cholesterol, basic metabolic panel, liver panel   Testing/Procedures: Your physician has requested that you have a lower extremity arterial duplex. This test is an ultrasound of the arteries in the legs or arms. It looks at arterial blood flow in the legs and arms. Allow one hour for Lower and Upper Arterial scans. There are no restrictions or special instructions   Follow-Up: Your physician wants you to follow-up in: 6 months with Dr. Acie Fredrickson.  You will receive a reminder letter in the mail two months in advance. If you don't receive a letter, please call our office to schedule the follow-up appointment.   If you need a refill on your cardiac medications before your next appointment, please call your pharmacy.   Thank you for choosing [facility]! Christen Bame, RN [phone]

## 2017-04-30 ENCOUNTER — Other Ambulatory Visit: Payer: Self-pay | Admitting: *Deleted

## 2017-05-02 ENCOUNTER — Other Ambulatory Visit: Payer: Self-pay | Admitting: Cardiovascular Disease

## 2017-05-02 DIAGNOSIS — I739 Peripheral vascular disease, unspecified: Secondary | ICD-10-CM

## 2017-05-18 ENCOUNTER — Ambulatory Visit (HOSPITAL_COMMUNITY)
Admission: RE | Admit: 2017-05-18 | Discharge: 2017-05-18 | Disposition: A | Discharge status: Home / Self Care | Payer: Medicare Other | Source: Ambulatory Visit | Attending: Cardiovascular Disease | Admitting: Cardiovascular Disease

## 2017-05-18 DIAGNOSIS — I2510 Atherosclerotic heart disease of native coronary artery without angina pectoris: Secondary | ICD-10-CM | POA: Insufficient documentation

## 2017-05-18 DIAGNOSIS — E119 Type 2 diabetes mellitus without complications: Secondary | ICD-10-CM | POA: Diagnosis not present

## 2017-05-18 DIAGNOSIS — E785 Hyperlipidemia, unspecified: Secondary | ICD-10-CM | POA: Diagnosis not present

## 2017-05-18 DIAGNOSIS — Z87891 Personal history of nicotine dependence: Secondary | ICD-10-CM | POA: Diagnosis not present

## 2017-05-18 DIAGNOSIS — I739 Peripheral vascular disease, unspecified: Secondary | ICD-10-CM | POA: Diagnosis not present

## 2017-05-18 DIAGNOSIS — I10 Essential (primary) hypertension: Secondary | ICD-10-CM | POA: Insufficient documentation

## 2017-05-28 ENCOUNTER — Other Ambulatory Visit: Payer: Self-pay | Admitting: Cardiovascular Disease

## 2017-08-29 ENCOUNTER — Telehealth: Payer: Self-pay | Admitting: Cardiovascular Disease

## 2017-08-29 MED ORDER — LISINOPRIL 10 MG PO TABS: 10.0000 mg | ORAL_TABLET | Freq: Every day | ORAL | 1 refills | Status: DC

## 2017-08-29 NOTE — Telephone Encounter (Signed)
Many lots of irbesartan, valsartan, and losartan have been recalled by the FDA due to contamination with carcinogens. We have been transitioning patients to ACEi since none of them have been affected by the recalls. Equivalent dose would be lisinopril 10mg  daily.

## 2017-08-29 NOTE — Telephone Encounter (Signed)
Will route to Pharm D and primary MD for recommendations

## 2017-08-29 NOTE — Telephone Encounter (Signed)
Pt notified of med change and will pick at [facility] .  Pt will call in couple of weeks if tolerates for script to be sent to [facility]

## 2017-08-29 NOTE — Telephone Encounter (Signed)
New message     Pt c/o medication issue:  1. Name of Medication: irbesartan (AVAPRO) 75 MG tablet  2. How are you currently taking this medication (dosage and times per day)? As prescribed  3. Are you having a reaction (difficulty breathing--STAT)? No  4. What is your medication issue? Out of stock at [facility]

## 2017-08-30 NOTE — Telephone Encounter (Signed)
Agree with Lisinopril 10 mg a day

## 2017-09-05 ENCOUNTER — Other Ambulatory Visit: Payer: Self-pay | Admitting: Family Medicine

## 2017-09-05 DIAGNOSIS — R1011 Right upper quadrant pain: Secondary | ICD-10-CM

## 2017-09-14 ENCOUNTER — Other Ambulatory Visit: Payer: Medicare Other

## 2017-09-19 ENCOUNTER — Other Ambulatory Visit: Payer: Self-pay | Admitting: Cardiovascular Disease

## 2017-10-09 ENCOUNTER — Other Ambulatory Visit: Payer: Self-pay | Admitting: Cardiovascular Disease

## 2017-10-10 NOTE — H&P (View-Only) (Signed)
Cardiology Office Note    Date:  10/11/2017   ID:  Benjamin Harrison, DOB 01-Oct-1934, MRN [id number]  PCP:  Leonard Downing, MD  Cardiologist:  Dr. Acie Fredrickson  Chief Complaint: Skipped beat  History of Present Illness:   Benjamin Harrison is a 83 y.o. male CAD s/p CABG 2001 & multiple stenting afterwards, HTN, HLD, DM and hypothyroidism presents for discussion of 30-day event monitor.  Last cath 12/06/2015 when he admitted with [place]. His cath showed his previous triple vessel CAD s/p CABG with 3/4 patent grafts, RCA is stented in the mid and distal segments, and there were three areas of severe restenosis in the mid and distal RCA and a chronically occluded prox LAD with patent VG. He underwent successful PTCA with angiosculpt cutting balloon in the mid and distal RCA.   He was doing relatively well on cardiac stand point except intermittent angina episode that relieved with SL nitro when last seen by Dr. Acie Fredrickson 03/2017.  Recently patient dealing with a skipped beat and intermittent palpitation.  Patient here with 30-day event monitor that was prescribed by PCP.  Preliminary review showed frequent PVCs with bigeminy (report given to Dr. Acie Fredrickson for review).  Patient also has worsening anginal episode since last office visit.  He described chest heaviness radiating to his back.  This occurs intermittently.  Sometimes relieves with rest and sometimes required nitroglycerin.  Lately it is happening at rest as well.  He has noted intermittent lower extremity edema.  No orthopnea or PND.   Past Medical History:  Diagnosis Date  . CHF (congestive heart failure) ([place])   . Coronary artery disease    a. CABG 2001 with early failure of VG-RCA. b. Inf MI after Lexiscan 01/28/2009 s/p RCA stent. c. Stent to dRCA 02/24/09. d. DES to [place]  2012. e. NSTEMI s/p angioplasty for ISR of RCA 09/2011. f. 05/2012 Cath: LM 20-30p, LAD 100p, LCX min irregs, RCA patent prox stent, 52m ISR, VG->Diag nl w/ dzs in  D1 prox to graft, VG->RCA 100, VG->OM nl, LIMA->LAD nl, Nl EF->Med Rx; g. 09/2014 low-risk MV.  Marland Kitchen History of echocardiogram    a. 09/2014 Echo: EF 55-60%, mild basal-midinferior HK, triv AI.  Marland Kitchen Hyperlipidemia   . Hypertension   . Hypertensive heart disease   . Hypothyroidism   . Osteoarthritis    a.End-stage osteoarthritis, right knee, medial compartment  . Personal history of tobacco use   . Type II diabetes mellitus ([place])     Past Surgical History:  Procedure Laterality Date  . CARDIAC CATHETERIZATION    . CARDIAC CATHETERIZATION N/A 12/06/2015   Procedure: Left Heart Cath and Cors/Grafts Angiography;  Surgeon: Burnell Blanks, MD;  Location: [place] [facility];  Service: Cardiovascular;  Laterality: N/A;  . CARDIAC CATHETERIZATION N/A 12/06/2015   Procedure: Coronary Balloon Angioplasty;  Surgeon: Burnell Blanks, MD;  Location: [facility];  Service: Cardiovascular;  Laterality: N/A;  . CORONARY ARTERY BYPASS GRAFT    . HERNIA REPAIR  1985  . KNEE ARTHROSCOPY  1999 and 2004  . LEFT HEART CATHETERIZATION WITH CORONARY ANGIOGRAM N/A 10/09/2011   Procedure: LEFT HEART CATHETERIZATION WITH CORONARY ANGIOGRAM;  Surgeon: Peter M Martinique, MD;  Location: [facility] CATH LAB;  Service: Cardiovascular;  Laterality: N/A;  . LEFT HEART CATHETERIZATION WITH CORONARY/GRAFT ANGIOGRAM N/A 06/06/2012   Procedure: LEFT HEART CATHETERIZATION WITH Beatrix Fetters;  Surgeon: Peter M Martinique, MD;  Location: [facility] CATH LAB;  Service: Cardiovascular;  Laterality:  N/A;    Current Medications: Prior to Admission medications   Medication Sig Start Date End Date Taking? Authorizing Provider  aspirin 81 MG tablet Take 1 tablet (81 mg total) by mouth daily. 10/10/11   [name], NP  atorvastatin (LIPITOR) 40 MG tablet TAKE 1 TABLET BY MOUTH  DAILY 05/29/17   [name], [name], MD  clopidogrel (PLAVIX) 75 MG tablet TAKE 1 TABLET BY MOUTH  DAILY 05/29/17   [name], [name], MD    famotidine (PEPCID AC) 10 MG chewable tablet Chew 1 tablet (10 mg total) by mouth 2 (two) times daily as needed for heartburn. 03/28/16   [name], [name], MD  glimepiride (AMARYL) 4 MG tablet Take 4 mg by mouth 2 (two) times daily.     [provider]  hydrochlorothiazide (HYDRODIURIL) 25 MG tablet Take 1 tablet (25 mg total) by mouth daily. 12/19/13   [name], [name], MD  isosorbide mononitrate (IMDUR) 60 MG 24 hr tablet TAKE 1 TABLET BY MOUTH  DAILY 09/19/17   [name], [name], MD  KLOR-CON 10 10 MEQ tablet TAKE 1 TABLET BY MOUTH EVERY DAY 10/09/17   [name], [name], MD  levothyroxine (SYNTHROID, LEVOTHROID) 100 MCG tablet Take 100 mcg by mouth daily.    [provider]  lisinopril (PRINIVIL,ZESTRIL) 10 MG tablet Take 1 tablet (10 mg total) by mouth daily. 08/29/17 11/27/17  [name], [name], MD  metFORMIN (GLUCOPHAGE) 500 MG tablet Take 500 mg by mouth every evening. Pt takes 1000mg  in the morning and 500mg  in the evening    [provider]  metoprolol tartrate (LOPRESSOR) 25 MG tablet TAKE 1 TABLET BY MOUTH TWO  TIMES DAILY 05/29/17   [name], [name], MD  nitroGLYCERIN (NITROSTAT) 0.4 MG SL tablet PLACE 1 TABLET (0.4 MG TOTAL) UNDER THE TONGUE EVERY 5 (FIVE) MINUTES AS NEEDED. FOR CHEST PAIN. 04/30/17   [name], [name], MD  Polyethyl Glycol-Propyl Glycol (SYSTANE FREE OP) Place 1 drop into both eyes daily.    [provider]    Allergies:   Codeine   Social History   Socioeconomic History  . Marital status: Married    Spouse name: None  . Number of children: None  . Years of education: None  . Highest education level: None  Social Needs  . Financial resource strain: None  . Food insecurity - worry: None  . Food insecurity - inability: None  . Transportation needs - medical: None  . Transportation needs - non-medical: None  Occupational History  . None  Tobacco Use  . Smoking status: Former Smoker    Last attempt to quit: 08/01/1975    Years  since quitting: 42.2  . Smokeless tobacco: Never Used  Substance and Sexual Activity  . Alcohol use: No  . Drug use: No  . Sexual activity: No  Other Topics Concern  . None  Social History Narrative   Lives locally with wife.     Family History:  The patient's family history includes Heart disease in his brother and brother; Hypertension in his mother; Pneumonia in his brother; Prostate cancer in his brother; Throat cancer in his sister; Unexplained death in his brother, brother, and mother.   ROS:   Please see the history of present illness.    ROS All other systems reviewed and are negative.   PHYSICAL EXAM:   VS:  BP 138/80 (BP Location: Right Arm, Patient Position: Sitting, Cuff Size: Normal)   Pulse (!) 55   Ht 5'  5" (1.651 m)   Wt 169 lb (76.7 kg)   SpO2 99%   BMI 28.12 kg/m    GEN: Well nourished, well developed, in no acute distress  HEENT: normal  Neck: no JVD, carotid bruits, or masses Cardiac: RRR; no murmurs, rubs, or gallops, Trace  edema  Respiratory:  clear to auscultation bilaterally, normal work of breathing GI: soft, nontender, nondistended, + BS MS: no deformity or atrophy  Skin: warm and dry, no rash Neuro:  Alert and Oriented x 3, Strength and sensation are intact Psych: euthymic mood, full affect  Wt Readings from Last 3 Encounters:  10/11/17 169 lb (76.7 kg)  04/27/17 171 lb 6.4 oz (77.7 kg)  09/12/16 170 lb (77.1 kg)      Studies/Labs Reviewed:   EKG:  EKG is not  ordered today.    Recent Labs: 04/27/2017: ALT 23; BUN 18; Creatinine, Ser 1.16; Potassium 4.5; Sodium 141   Lipid Panel    Component Value Date/Time   CHOL 114 04/27/2017 0851   TRIG 168 (H) 04/27/2017 0851   HDL 26 (L) 04/27/2017 0851   CHOLHDL 4.4 04/27/2017 0851   CHOLHDL 4.1 03/22/2016 0959   VLDL 23 03/22/2016 0959   LDLCALC 54 04/27/2017 0851    Additional studies/ records that were reviewed today include:   Coronary Balloon Angioplasty  Left Heart Cath and  Cors/Grafts Angiography  Conclusion    Prox RCA lesion, 80% stenosed.  Dist RCA lesion, 70% stenosed.  Mid RCA lesion, 90% stenosed.  Ost RPDA to RPDA lesion, 40% stenosed.  SVG was not injected .  Known to be occluded.  Mid Cx lesion, 99% stenosed.  2nd Mrg lesion, 80% stenosed.  SVG was injected is normal in caliber, and is anatomically normal.  Prox LAD lesion, 100% stenosed.  Ost 1st Diag lesion, 99% stenosed.  SVG was injected is normal in caliber, and is anatomically normal.  LIMA was injected is normal in caliber, and is anatomically normal.  Ost LM lesion, 40% stenosed.  1st Mrg lesion, 30% stenosed.  Ost LAD to Prox LAD lesion, 50% stenosed.   1. 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  2. The graft to the native RCA is known to be occluded. The RCA is stented in the mid and distal segments. There are three areas of severe restenosis in the mid and distal RCA.  3. Successful PTCA with Angiosculpt cutting balloon in the mid and distal RCA 4. Moderate left main stenosis (this supplies the AV groove Circumflex) 5. Severe stenosis OM1 with patent graft to the OM1.  6. Chronically occluded proximal LAD. The Diagonal fills from the patent vein graft. The mid and distal LAD fills from the patent IMA graft.   Recommendations: Will continue ASA, Plavix, beta blocker and statin. If he has recurrence of anginal symptoms, may need consider placing new stents but this will be a third layer of stenting in several segments.    Diagnostic Diagram       Post-Intervention Diagram           ASSESSMENT & PLAN:    1. CAD s/p CABG - Last cath 11/2015 as above.  Recently worsened anginal symptoms.  Reviewed with Dr. Acie Fredrickson.  Will proceed with left heart cath.  If elevated BNP>>> consider right heart cath as well.  For lower extremity edema will check BNP.  2. HTN -Stable and controlled on current medication.  3. HLD - 04/27/2017: Cholesterol,  Total 114; HDL 26;  LDL Calculated 54; Triglycerides 168  -Continue statin.  4.  Skipped beat/palpitation -Results given to Dr. Acie Fredrickson for review.  On preliminary review, noted PVC and bigeminy.  Check electrolytes.  No change in medication made today.    Medication Adjustments/Labs and Tests Ordered: Current medicines are reviewed at length with the patient today.  Concerns regarding medicines are outlined above.  Medication changes, Labs and Tests ordered today are listed in the Patient Instructions below. Patient Instructions  Medication Instructions: Your physician recommends that you continue on your current medications as directed. Please refer to the Current Medication list given to you today.  Labwork: Your physician has recommended that you have lab work today: BMET, CBC, PT/INR, and BNP  Procedures/Testing: Your physician has requested that you have a cardiac catheterization. Cardiac catheterization is used to diagnose and/or treat various heart conditions. Doctors may recommend this procedure for a number of different reasons. The most common reason is to evaluate chest pain. Chest pain can be a symptom of coronary artery disease (CAD), and cardiac catheterization can show whether plaque is narrowing or blocking your heart's arteries. This procedure is also used to evaluate the valves, as well as measure the blood flow and oxygen levels in different parts of your heart. For further information please visit [email or web address]. Please follow instruction sheet, as given.   Follow-Up: Your physician recommends that you schedule a follow-up appointment in [place] with Dr. Acie Fredrickson or an APP on Dr. Elmarie Shiley Team     [facility] CARDIOVASCULAR DIVISION [place] [address] [place] [address] Dept: [phone] Loc: [phone]  YERACHMIEL SPINNEY  10/11/2017  You are scheduled for a Cardiac Catheterization on  Monday, March 18 with Dr. Daneen Schick.  1. Please arrive at the [facility] (Main Entrance A) at [facility]: [address] [place], [place] [address] at 8:00 AM (two hours before your procedure to ensure your preparation). Free valet parking service is available.   Special note: Every effort is made to have your procedure done on time. Please understand that emergencies sometimes delay scheduled procedures.  2. Diet: Do not eat or drink anything after midnight prior to your procedure except sips of water to take medications.  3. Labs: You will have labs drawn in office today  4. Medication instructions in preparation for your procedure:  You may take all you regular morning medications the morning of the procedure with enough water to get them down safely.   On the morning of your procedure, take your Aspirin and any morning medicines NOT listed above.  You may use sips of water.  5. Plan for one night stay--bring personal belongings. 6. Bring a current list of your medications and current insurance cards. 7. You MUST have a responsible person to drive you home. 8. Someone MUST be with you the first 24 hours after you arrive home or your discharge will be delayed. 9. Please wear clothes that are easy to get on and off and wear slip-on shoes.  Thank you for allowing [place] to care for you!   -- [facility]   If you need a refill on your cardiac medications before your next appointment, please call your pharmacy.      Jarrett Soho, [place]  10/11/2017 11:12 AM    [address], [place], [place]  [address] Phone: [phone]; Fax: [phone]

## 2017-10-10 NOTE — Progress Notes (Signed)
Cardiology Office Note    Date:  10/11/2017   ID:  Benjamin Harrison, DOB 01-14-1935, MRN [id number]  PCP:  Leonard Downing, MD  Cardiologist:  Dr. Acie Fredrickson  Chief Complaint: Skipped beat  History of Present Illness:   Benjamin Harrison is a 83 y.o. male CAD s/p CABG 2001 & multiple stenting afterwards, HTN, HLD, DM and hypothyroidism presents for discussion of 30-day event monitor.  Last cath 12/06/2015 when he admitted with [place]. His cath showed his previous triple vessel CAD s/p CABG with 3/4 patent grafts, RCA is stented in the mid and distal segments, and there were three areas of severe restenosis in the mid and distal RCA and a chronically occluded prox LAD with patent VG. He underwent successful PTCA with angiosculpt cutting balloon in the mid and distal RCA.   He was doing relatively well on cardiac stand point except intermittent angina episode that relieved with SL nitro when last seen by Dr. Acie Fredrickson 03/2017.  Recently patient dealing with a skipped beat and intermittent palpitation.  Patient here with 30-day event monitor that was prescribed by PCP.  Preliminary review showed frequent PVCs with bigeminy (report given to Dr. Acie Fredrickson for review).  Patient also has worsening anginal episode since last office visit.  He described chest heaviness radiating to his back.  This occurs intermittently.  Sometimes relieves with rest and sometimes required nitroglycerin.  Lately it is happening at rest as well.  He has noted intermittent lower extremity edema.  No orthopnea or PND.   Past Medical History:  Diagnosis Date  . CHF (congestive heart failure) ([place])   . Coronary artery disease    a. CABG 2001 with early failure of VG-RCA. b. Inf MI after Lexiscan 01/28/2009 s/p RCA stent. c. Stent to dRCA 02/24/09. d. DES to [place]  2012. e. NSTEMI s/p angioplasty for ISR of RCA 09/2011. f. 05/2012 Cath: LM 20-30p, LAD 100p, LCX min irregs, RCA patent prox stent, 69m ISR, VG->Diag nl w/ dzs in  D1 prox to graft, VG->RCA 100, VG->OM nl, LIMA->LAD nl, Nl EF->Med Rx; g. 09/2014 low-risk MV.  Marland Kitchen History of echocardiogram    a. 09/2014 Echo: EF 55-60%, mild basal-midinferior HK, triv AI.  Marland Kitchen Hyperlipidemia   . Hypertension   . Hypertensive heart disease   . Hypothyroidism   . Osteoarthritis    a.End-stage osteoarthritis, right knee, medial compartment  . Personal history of tobacco use   . Type II diabetes mellitus ([place])     Past Surgical History:  Procedure Laterality Date  . CARDIAC CATHETERIZATION    . CARDIAC CATHETERIZATION N/A 12/06/2015   Procedure: Left Heart Cath and Cors/Grafts Angiography;  Surgeon: Burnell Blanks, MD;  Location: [facility];  Service: Cardiovascular;  Laterality: N/A;  . CARDIAC CATHETERIZATION N/A 12/06/2015   Procedure: Coronary Balloon Angioplasty;  Surgeon: Burnell Blanks, MD;  Location: [facility];  Service: Cardiovascular;  Laterality: N/A;  . CORONARY ARTERY BYPASS GRAFT    . HERNIA REPAIR  1985  . KNEE ARTHROSCOPY  1999 and 2004  . LEFT HEART CATHETERIZATION WITH CORONARY ANGIOGRAM N/A 10/09/2011   Procedure: LEFT HEART CATHETERIZATION WITH CORONARY ANGIOGRAM;  Surgeon: Peter M Martinique, MD;  Location: [facility] CATH LAB;  Service: Cardiovascular;  Laterality: N/A;  . LEFT HEART CATHETERIZATION WITH CORONARY/GRAFT ANGIOGRAM N/A 06/06/2012   Procedure: LEFT HEART CATHETERIZATION WITH Beatrix Fetters;  Surgeon: Peter M Martinique, MD;  Location: [facility] CATH LAB;  Service: Cardiovascular;  Laterality:  N/A;    Current Medications: Prior to Admission medications   Medication Sig Start Date End Date Taking? Authorizing Provider  aspirin 81 MG tablet Take 1 tablet (81 mg total) by mouth daily. 10/10/11   [name], NP  atorvastatin (LIPITOR) 40 MG tablet TAKE 1 TABLET BY MOUTH  DAILY 05/29/17   [name], [name], MD  clopidogrel (PLAVIX) 75 MG tablet TAKE 1 TABLET BY MOUTH  DAILY 05/29/17   [name], [name], MD    famotidine (PEPCID AC) 10 MG chewable tablet Chew 1 tablet (10 mg total) by mouth 2 (two) times daily as needed for heartburn. 03/28/16   [name], [name], MD  glimepiride (AMARYL) 4 MG tablet Take 4 mg by mouth 2 (two) times daily.     [provider]  hydrochlorothiazide (HYDRODIURIL) 25 MG tablet Take 1 tablet (25 mg total) by mouth daily. 12/19/13   [name], [name], MD  isosorbide mononitrate (IMDUR) 60 MG 24 hr tablet TAKE 1 TABLET BY MOUTH  DAILY 09/19/17   [name], [name], MD  KLOR-CON 10 10 MEQ tablet TAKE 1 TABLET BY MOUTH EVERY DAY 10/09/17   [name], [name], MD  levothyroxine (SYNTHROID, LEVOTHROID) 100 MCG tablet Take 100 mcg by mouth daily.    [provider]  lisinopril (PRINIVIL,ZESTRIL) 10 MG tablet Take 1 tablet (10 mg total) by mouth daily. 08/29/17 11/27/17  [name], [name], MD  metFORMIN (GLUCOPHAGE) 500 MG tablet Take 500 mg by mouth every evening. Pt takes 1000mg  in the morning and 500mg  in the evening    [provider]  metoprolol tartrate (LOPRESSOR) 25 MG tablet TAKE 1 TABLET BY MOUTH TWO  TIMES DAILY 05/29/17   [name], [name], MD  nitroGLYCERIN (NITROSTAT) 0.4 MG SL tablet PLACE 1 TABLET (0.4 MG TOTAL) UNDER THE TONGUE EVERY 5 (FIVE) MINUTES AS NEEDED. FOR CHEST PAIN. 04/30/17   [name], [name], MD  Polyethyl Glycol-Propyl Glycol (SYSTANE FREE OP) Place 1 drop into both eyes daily.    [provider]    Allergies:   Codeine   Social History   Socioeconomic History  . Marital status: Married    Spouse name: None  . Number of children: None  . Years of education: None  . Highest education level: None  Social Needs  . Financial resource strain: None  . Food insecurity - worry: None  . Food insecurity - inability: None  . Transportation needs - medical: None  . Transportation needs - non-medical: None  Occupational History  . None  Tobacco Use  . Smoking status: Former Smoker    Last attempt to quit: 08/01/1975    Years  since quitting: 42.2  . Smokeless tobacco: Never Used  Substance and Sexual Activity  . Alcohol use: No  . Drug use: No  . Sexual activity: No  Other Topics Concern  . None  Social History Narrative   Lives locally with wife.     Family History:  The patient's family history includes Heart disease in his brother and brother; Hypertension in his mother; Pneumonia in his brother; Prostate cancer in his brother; Throat cancer in his sister; Unexplained death in his brother, brother, and mother.   ROS:   Please see the history of present illness.    ROS All other systems reviewed and are negative.   PHYSICAL EXAM:   VS:  BP 138/80 (BP Location: Right Arm, Patient Position: Sitting, Cuff Size: Normal)   Pulse (!) 55   Ht 5'  5" (1.651 m)   Wt 169 lb (76.7 kg)   SpO2 99%   BMI 28.12 kg/m    GEN: Well nourished, well developed, in no acute distress  HEENT: normal  Neck: no JVD, carotid bruits, or masses Cardiac: RRR; no murmurs, rubs, or gallops, Trace  edema  Respiratory:  clear to auscultation bilaterally, normal work of breathing GI: soft, nontender, nondistended, + BS MS: no deformity or atrophy  Skin: warm and dry, no rash Neuro:  Alert and Oriented x 3, Strength and sensation are intact Psych: euthymic mood, full affect  Wt Readings from Last 3 Encounters:  10/11/17 169 lb (76.7 kg)  04/27/17 171 lb 6.4 oz (77.7 kg)  09/12/16 170 lb (77.1 kg)      Studies/Labs Reviewed:   EKG:  EKG is not  ordered today.    Recent Labs: 04/27/2017: ALT 23; BUN 18; Creatinine, Ser 1.16; Potassium 4.5; Sodium 141   Lipid Panel    Component Value Date/Time   CHOL 114 04/27/2017 0851   TRIG 168 (H) 04/27/2017 0851   HDL 26 (L) 04/27/2017 0851   CHOLHDL 4.4 04/27/2017 0851   CHOLHDL 4.1 03/22/2016 0959   VLDL 23 03/22/2016 0959   LDLCALC 54 04/27/2017 0851    Additional studies/ records that were reviewed today include:   Coronary Balloon Angioplasty  Left Heart Cath and  Cors/Grafts Angiography  Conclusion    Prox RCA lesion, 80% stenosed.  Dist RCA lesion, 70% stenosed.  Mid RCA lesion, 90% stenosed.  Ost RPDA to RPDA lesion, 40% stenosed.  SVG was not injected .  Known to be occluded.  Mid Cx lesion, 99% stenosed.  2nd Mrg lesion, 80% stenosed.  SVG was injected is normal in caliber, and is anatomically normal.  Prox LAD lesion, 100% stenosed.  Ost 1st Diag lesion, 99% stenosed.  SVG was injected is normal in caliber, and is anatomically normal.  LIMA was injected is normal in caliber, and is anatomically normal.  Ost LM lesion, 40% stenosed.  1st Mrg lesion, 30% stenosed.  Ost LAD to Prox LAD lesion, 50% stenosed.   1. 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  2. The graft to the native RCA is known to be occluded. The RCA is stented in the mid and distal segments. There are three areas of severe restenosis in the mid and distal RCA.  3. Successful PTCA with Angiosculpt cutting balloon in the mid and distal RCA 4. Moderate left main stenosis (this supplies the AV groove Circumflex) 5. Severe stenosis OM1 with patent graft to the OM1.  6. Chronically occluded proximal LAD. The Diagonal fills from the patent vein graft. The mid and distal LAD fills from the patent IMA graft.   Recommendations: Will continue ASA, Plavix, beta blocker and statin. If he has recurrence of anginal symptoms, may need consider placing new stents but this will be a third layer of stenting in several segments.    Diagnostic Diagram       Post-Intervention Diagram           ASSESSMENT & PLAN:    1. CAD s/p CABG - Last cath 11/2015 as above.  Recently worsened anginal symptoms.  Reviewed with Dr. Acie Fredrickson.  Will proceed with left heart cath.  If elevated BNP>>> consider right heart cath as well.  For lower extremity edema will check BNP.  2. HTN -Stable and controlled on current medication.  3. HLD - 04/27/2017: Cholesterol,  Total 114; HDL 26;  LDL Calculated 54; Triglycerides 168  -Continue statin.  4.  Skipped beat/palpitation -Results given to Dr. Acie Fredrickson for review.  On preliminary review, noted PVC and bigeminy.  Check electrolytes.  No change in medication made today.    Medication Adjustments/Labs and Tests Ordered: Current medicines are reviewed at length with the patient today.  Concerns regarding medicines are outlined above.  Medication changes, Labs and Tests ordered today are listed in the Patient Instructions below. Patient Instructions  Medication Instructions: Your physician recommends that you continue on your current medications as directed. Please refer to the Current Medication list given to you today.  Labwork: Your physician has recommended that you have lab work today: BMET, CBC, PT/INR, and BNP  Procedures/Testing: Your physician has requested that you have a cardiac catheterization. Cardiac catheterization is used to diagnose and/or treat various heart conditions. Doctors may recommend this procedure for a number of different reasons. The most common reason is to evaluate chest pain. Chest pain can be a symptom of coronary artery disease (CAD), and cardiac catheterization can show whether plaque is narrowing or blocking your heart's arteries. This procedure is also used to evaluate the valves, as well as measure the blood flow and oxygen levels in different parts of your heart. For further information please visit HugeFiesta.[email or web address]. Please follow instruction sheet, as given.   Follow-Up: Your physician recommends that you schedule a follow-up appointment in [place] with Dr. Acie Fredrickson or an APP on Dr. Elmarie Shiley Team     [facility] GROUP [facility] CARDIOVASCULAR DIVISION [employer] [address], [place] [address] Dept: [phone] Loc: [phone]  Benjamin Harrison  10/11/2017  You are scheduled for a Cardiac Catheterization on  Monday, March 18 with Dr. Daneen Schick.  1. Please arrive at the [facility] (Main Entrance A) at [facility]: [address] [place], [place] [address] at 8:00 AM (two hours before your procedure to ensure your preparation). Free valet parking service is available.   Special note: Every effort is made to have your procedure done on time. Please understand that emergencies sometimes delay scheduled procedures.  2. Diet: Do not eat or drink anything after midnight prior to your procedure except sips of water to take medications.  3. Labs: You will have labs drawn in office today  4. Medication instructions in preparation for your procedure:  You may take all you regular morning medications the morning of the procedure with enough water to get them down safely.   On the morning of your procedure, take your Aspirin and any morning medicines NOT listed above.  You may use sips of water.  5. Plan for one night stay--bring personal belongings. 6. Bring a current list of your medications and current insurance cards. 7. You MUST have a responsible person to drive you home. 8. Someone MUST be with you the first 24 hours after you arrive home or your discharge will be delayed. 9. Please wear clothes that are easy to get on and off and wear slip-on shoes.  Thank you for allowing [place] to care for you!   -- [place] [facility]   If you need a refill on your cardiac medications before your next appointment, please call your pharmacy.      Benjamin Harrison, [place]  10/11/2017 11:12 AM    Benjamin Harrison, [place], [place]  [address] Phone: [phone]; Fax: [phone]

## 2017-10-11 ENCOUNTER — Telehealth: Payer: Self-pay | Admitting: *Deleted

## 2017-10-11 ENCOUNTER — Ambulatory Visit: Payer: Medicare Other | Admitting: Physician Assistant

## 2017-10-11 ENCOUNTER — Encounter: Payer: Self-pay | Admitting: Physician Assistant

## 2017-10-11 VITALS — BP 138/80 | HR 55 | Ht 65.0 in | Wt 169.0 lb

## 2017-10-11 DIAGNOSIS — E785 Hyperlipidemia, unspecified: Secondary | ICD-10-CM | POA: Diagnosis not present

## 2017-10-11 DIAGNOSIS — Z01812 Encounter for preprocedural laboratory examination: Secondary | ICD-10-CM

## 2017-10-11 DIAGNOSIS — I25110 Atherosclerotic heart disease of native coronary artery with unstable angina pectoris: Secondary | ICD-10-CM | POA: Diagnosis not present

## 2017-10-11 DIAGNOSIS — I10 Essential (primary) hypertension: Secondary | ICD-10-CM

## 2017-10-11 DIAGNOSIS — I2510 Atherosclerotic heart disease of native coronary artery without angina pectoris: Secondary | ICD-10-CM | POA: Diagnosis not present

## 2017-10-11 MED ORDER — LISINOPRIL 10 MG PO TABS: 10.0000 mg | ORAL_TABLET | Freq: Every day | ORAL | 2 refills | Status: DC

## 2017-10-11 NOTE — Telephone Encounter (Addendum)
Catheterization scheduled at [facility] for: Monday October 15, 2017 at 10:30 AM Verified  arrival time and place: Benjamin Harrison at: 8 AM Nothing to eat or drink after midnight prior to cath. Verified allergies in Epic.  Hold: No metformin --pt states 10/10/17 Dr Arelia Sneddon discontinued metformin for 1 month to see if GI symptoms improved.  Pt knows if he was taking metformin he should not take  10/14/17, 10/15/17 and 48 hours post cath No glimepiride AM of cath No hydrochlorothiazide (HCTZ) AM of cath   Except hold medications AM meds can be  taken pre-cath with sip of water including: ASA 81mg   Clopidogrel 75 mg    Confirmed patient has responsible person to drive home post procedure and observe patient for 24 hours: yes

## 2017-10-11 NOTE — Patient Instructions (Signed)
Medication Instructions: Your physician recommends that you continue on your current medications as directed. Please refer to the Current Medication list given to you today.  Labwork: Your physician has recommended that you have lab work today: BMET, CBC, PT/INR, and BNP  Procedures/Testing: Your physician has requested that you have a cardiac catheterization. Cardiac catheterization is used to diagnose and/or treat various heart conditions. Doctors may recommend this procedure for a number of different reasons. The most common reason is to evaluate chest pain. Chest pain can be a symptom of coronary artery disease (CAD), and cardiac catheterization can show whether plaque is narrowing or blocking your heart's arteries. This procedure is also used to evaluate the valves, as well as measure the blood flow and oxygen levels in different parts of your heart. For further information please visit HugeFiesta.[email or web address]. Please follow instruction sheet, as given.   Follow-Up: Your physician recommends that you schedule a follow-up appointment in [place] with Dr. Acie Fredrickson or an APP on Dr. Elmarie Shiley Team     [facility] GROUP [facility] CARDIOVASCULAR DIVISION [place] [address] [place] [address] Dept: [phone] Loc: [phone]  Benjamin Harrison  10/11/2017  You are scheduled for a Cardiac Catheterization on Monday, March 18 with Dr. Daneen Schick.  1. Please arrive at the [facility] (Main Entrance A) at [facility]: [address] [place], [place] [address] at 8:00 AM (two hours before your procedure to ensure your preparation). Free valet parking service is available.   Special note: Every effort is made to have your procedure done on time. Please understand that emergencies sometimes delay scheduled procedures.  2. Diet: Do not eat or drink anything after midnight prior to your procedure except sips of water to take  medications.  3. Labs: You will have labs drawn in office today  4. Medication instructions in preparation for your procedure:  You may take all you regular morning medications the morning of the procedure with enough water to get them down safely.   On the morning of your procedure, take your Aspirin and any morning medicines NOT listed above.  You may use sips of water.  5. Plan for one night stay--bring personal belongings. 6. Bring a current list of your medications and current insurance cards. 7. You MUST have a responsible person to drive you home. 8. Someone MUST be with you the first 24 hours after you arrive home or your discharge will be delayed. 9. Please wear clothes that are easy to get on and off and wear slip-on shoes.  Thank you for allowing [place] to care for you!   -- [place] [facility]   If you need a refill on your cardiac medications before your next appointment, please call your pharmacy.

## 2017-10-12 LAB — CBC WITH DIFFERENTIAL/PLATELET
BASOS ABS: 0 10*3/uL (ref 0.0–0.2)
Basos: 0 %
EOS (ABSOLUTE): 0.2 10*3/uL (ref 0.0–0.4)
EOS: 2 %
HEMATOCRIT: 38.7 % (ref 37.5–51.0)
HEMOGLOBIN: 13.5 g/dL (ref 13.0–17.7)
IMMATURE GRANS (ABS): 0 10*3/uL (ref 0.0–0.1)
IMMATURE GRANULOCYTES: 1 %
LYMPHS: 19 %
Lymphocytes Absolute: 1.2 10*3/uL (ref 0.7–3.1)
MCH: 25.6 pg — ABNORMAL LOW (ref 26.6–33.0)
MCHC: 34.9 g/dL (ref 31.5–35.7)
MCV: 73 fL — ABNORMAL LOW (ref 79–97)
MONOCYTES: 16 %
Monocytes Absolute: 1 10*3/uL — ABNORMAL HIGH (ref 0.1–0.9)
Neutrophils Absolute: 3.9 10*3/uL (ref 1.4–7.0)
Neutrophils: 62 %
Platelets: 191 10*3/uL (ref 150–379)
RBC: 5.27 x10E6/uL (ref 4.14–5.80)
RDW: 16.7 % — ABNORMAL HIGH (ref 12.3–15.4)
WBC: 6.3 10*3/uL (ref 3.4–10.8)

## 2017-10-12 LAB — BASIC METABOLIC PANEL
BUN / CREAT RATIO: 17 (ref 10–24)
BUN: 21 mg/dL (ref 8–27)
CALCIUM: 9.4 mg/dL (ref 8.6–10.2)
CHLORIDE: 102 mmol/L (ref 96–106)
CO2: 26 mmol/L (ref 20–29)
CREATININE: 1.22 mg/dL (ref 0.76–1.27)
GFR calc Af Amer: 64 mL/min/{1.73_m2} (ref >59)
GFR calc non Af Amer: 56 mL/min/{1.73_m2} — ABNORMAL LOW (ref >59)
GLUCOSE: 139 mg/dL — AB (ref 65–99)
Potassium: 4.7 mmol/L (ref 3.5–5.2)
Sodium: 142 mmol/L (ref 134–144)

## 2017-10-12 LAB — PROTIME-INR
INR: 1.1 (ref 0.8–1.2)
Prothrombin Time: 11.3 s (ref 9.1–12.0)

## 2017-10-12 LAB — PRO B NATRIURETIC PEPTIDE: NT-PRO BNP: 210 pg/mL (ref 0–486)

## 2017-10-15 ENCOUNTER — Ambulatory Visit (HOSPITAL_COMMUNITY)
Admission: RE | Disposition: A | Discharge status: Home / Self Care | Payer: Self-pay | Source: Ambulatory Visit | Attending: Interventional Cardiology

## 2017-10-15 ENCOUNTER — Ambulatory Visit (HOSPITAL_COMMUNITY)
Admission: RE | Admit: 2017-10-15 | Discharge: 2017-10-16 | Disposition: A | Discharge status: Home / Self Care | Payer: Medicare Other | Source: Ambulatory Visit | Attending: Interventional Cardiology | Admitting: Interventional Cardiology

## 2017-10-15 DIAGNOSIS — I214 Non-ST elevation (NSTEMI) myocardial infarction: Secondary | ICD-10-CM

## 2017-10-15 DIAGNOSIS — Z7984 Long term (current) use of oral hypoglycemic drugs: Secondary | ICD-10-CM | POA: Insufficient documentation

## 2017-10-15 DIAGNOSIS — T82855A Stenosis of coronary artery stent, initial encounter: Secondary | ICD-10-CM | POA: Diagnosis not present

## 2017-10-15 DIAGNOSIS — M1990 Unspecified osteoarthritis, unspecified site: Secondary | ICD-10-CM | POA: Diagnosis not present

## 2017-10-15 DIAGNOSIS — I252 Old myocardial infarction: Secondary | ICD-10-CM | POA: Diagnosis not present

## 2017-10-15 DIAGNOSIS — I2582 Chronic total occlusion of coronary artery: Secondary | ICD-10-CM | POA: Insufficient documentation

## 2017-10-15 DIAGNOSIS — I493 Ventricular premature depolarization: Secondary | ICD-10-CM | POA: Diagnosis not present

## 2017-10-15 DIAGNOSIS — Z7902 Long term (current) use of antithrombotics/antiplatelets: Secondary | ICD-10-CM | POA: Insufficient documentation

## 2017-10-15 DIAGNOSIS — Z87891 Personal history of nicotine dependence: Secondary | ICD-10-CM | POA: Diagnosis not present

## 2017-10-15 DIAGNOSIS — E119 Type 2 diabetes mellitus without complications: Secondary | ICD-10-CM

## 2017-10-15 DIAGNOSIS — I509 Heart failure, unspecified: Secondary | ICD-10-CM | POA: Insufficient documentation

## 2017-10-15 DIAGNOSIS — I110 Hypertensive heart disease with heart failure: Secondary | ICD-10-CM | POA: Insufficient documentation

## 2017-10-15 DIAGNOSIS — I2510 Atherosclerotic heart disease of native coronary artery without angina pectoris: Secondary | ICD-10-CM

## 2017-10-15 DIAGNOSIS — Z8249 Family history of ischemic heart disease and other diseases of the circulatory system: Secondary | ICD-10-CM | POA: Diagnosis not present

## 2017-10-15 DIAGNOSIS — Z9861 Coronary angioplasty status: Secondary | ICD-10-CM

## 2017-10-15 DIAGNOSIS — I209 Angina pectoris, unspecified: Secondary | ICD-10-CM | POA: Diagnosis present

## 2017-10-15 DIAGNOSIS — E785 Hyperlipidemia, unspecified: Secondary | ICD-10-CM | POA: Diagnosis present

## 2017-10-15 DIAGNOSIS — Z885 Allergy status to narcotic agent status: Secondary | ICD-10-CM | POA: Insufficient documentation

## 2017-10-15 DIAGNOSIS — I714 Abdominal aortic aneurysm, without rupture, unspecified: Secondary | ICD-10-CM | POA: Diagnosis present

## 2017-10-15 DIAGNOSIS — I10 Essential (primary) hypertension: Secondary | ICD-10-CM | POA: Diagnosis present

## 2017-10-15 DIAGNOSIS — E039 Hypothyroidism, unspecified: Secondary | ICD-10-CM | POA: Diagnosis not present

## 2017-10-15 DIAGNOSIS — Z7982 Long term (current) use of aspirin: Secondary | ICD-10-CM | POA: Diagnosis not present

## 2017-10-15 DIAGNOSIS — Y831 Surgical operation with implant of artificial internal device as the cause of abnormal reaction of the patient, or of later complication, without mention of misadventure at the time of the procedure: Secondary | ICD-10-CM | POA: Insufficient documentation

## 2017-10-15 DIAGNOSIS — I25710 Atherosclerosis of autologous vein coronary artery bypass graft(s) with unstable angina pectoris: Secondary | ICD-10-CM | POA: Insufficient documentation

## 2017-10-15 HISTORY — PX: LEFT HEART CATH AND CORS/GRAFTS ANGIOGRAPHY: CATH118250

## 2017-10-15 HISTORY — DX: Acute myocardial infarction, unspecified: I21.9

## 2017-10-15 HISTORY — DX: Gastro-esophageal reflux disease without esophagitis: K21.9

## 2017-10-15 HISTORY — DX: Unspecified malignant neoplasm of skin, unspecified: C44.90

## 2017-10-15 HISTORY — DX: Dyspnea, unspecified: R06.00

## 2017-10-15 HISTORY — PX: INTRAVASCULAR ULTRASOUND/IVUS: CATH118244

## 2017-10-15 HISTORY — DX: Abdominal aortic aneurysm, without rupture: I71.4

## 2017-10-15 HISTORY — DX: Abdominal aortic aneurysm, without rupture, unspecified: I71.40

## 2017-10-15 LAB — GLUCOSE, CAPILLARY
GLUCOSE-CAPILLARY: 116 mg/dL — AB (ref 65–99)
Glucose-Capillary: 77 mg/dL (ref 65–99)
Glucose-Capillary: 148 mg/dL — ABNORMAL HIGH (ref 65–99)

## 2017-10-15 LAB — POCT ACTIVATED CLOTTING TIME
ACTIVATED CLOTTING TIME: 417 s
Activated Clotting Time: 169 seconds

## 2017-10-15 SURGERY — LEFT HEART CATH AND CORS/GRAFTS ANGIOGRAPHY
Anesthesia: LOCAL

## 2017-10-15 MED ORDER — IOPAMIDOL (ISOVUE-370) INJECTION 76%
INTRAVENOUS | Status: AC
  Filled 2017-10-15: qty 100

## 2017-10-15 MED ORDER — MIDAZOLAM HCL 2 MG/2ML IJ SOLN
INTRAMUSCULAR | Status: AC
  Filled 2017-10-15: qty 2

## 2017-10-15 MED ORDER — GLIMEPIRIDE 4 MG PO TABS
4.0000 mg | ORAL_TABLET | Freq: Two times a day (BID) | ORAL | Status: DC
  Administered 2017-10-15 – 2017-10-16 (×2): 4 mg via ORAL
  Filled 2017-10-15 (×2): qty 1

## 2017-10-15 MED ORDER — CLOPIDOGREL BISULFATE 75 MG PO TABS
75.0000 mg | ORAL_TABLET | Freq: Every day | ORAL | Status: DC
  Administered 2017-10-16: 09:00:00 75 mg via ORAL
  Filled 2017-10-15: qty 1

## 2017-10-15 MED ORDER — SODIUM CHLORIDE 0.9 % WEIGHT BASED INFUSION
3.0000 mL/kg/h | INTRAVENOUS | Status: DC
  Administered 2017-10-15: 3 mL/kg/h via INTRAVENOUS

## 2017-10-15 MED ORDER — SODIUM CHLORIDE 0.9% FLUSH: 3.0000 mL | INTRAVENOUS | Status: DC | PRN

## 2017-10-15 MED ORDER — SODIUM CHLORIDE 0.9 % IV SOLN: 250.0000 mL | INTRAVENOUS | Status: DC | PRN

## 2017-10-15 MED ORDER — NITROGLYCERIN 1 MG/10 ML FOR IR/CATH LAB
INTRA_ARTERIAL | Status: AC
  Filled 2017-10-15: qty 10

## 2017-10-15 MED ORDER — FAMOTIDINE 10 MG PO TABS
10.0000 mg | ORAL_TABLET | Freq: Every day | ORAL | Status: DC
  Administered 2017-10-16: 10 mg via ORAL
  Filled 2017-10-15: qty 1

## 2017-10-15 MED ORDER — SODIUM CHLORIDE 0.9 % IV SOLN: INTRAVENOUS | Status: AC

## 2017-10-15 MED ORDER — FENTANYL CITRATE (PF) 100 MCG/2ML IJ SOLN
INTRAMUSCULAR | Status: AC
  Filled 2017-10-15: qty 2

## 2017-10-15 MED ORDER — MIDAZOLAM HCL 2 MG/2ML IJ SOLN
INTRAMUSCULAR | Status: DC | PRN
  Administered 2017-10-15 (×2): 1 mg via INTRAVENOUS

## 2017-10-15 MED ORDER — NITROGLYCERIN 1 MG/10 ML FOR IR/CATH LAB
INTRA_ARTERIAL | Status: DC | PRN
  Administered 2017-10-15: 200 ug via INTRACORONARY

## 2017-10-15 MED ORDER — ISOSORBIDE MONONITRATE ER 60 MG PO TB24
60.0000 mg | ORAL_TABLET | Freq: Every day | ORAL | Status: DC
Start: 2017-10-15 — End: 2017-10-16
  Filled 2017-10-15: qty 1

## 2017-10-15 MED ORDER — NITROGLYCERIN IN D5W 200-5 MCG/ML-% IV SOLN
INTRAVENOUS | Status: AC
  Filled 2017-10-15: qty 250

## 2017-10-15 MED ORDER — LIDOCAINE HCL (PF) 1 % IJ SOLN
INTRAMUSCULAR | Status: AC
  Filled 2017-10-15: qty 30

## 2017-10-15 MED ORDER — CLOPIDOGREL BISULFATE 75 MG PO TABS
ORAL_TABLET | ORAL | Status: AC
  Filled 2017-10-15: qty 3

## 2017-10-15 MED ORDER — PANTOPRAZOLE SODIUM 40 MG PO TBEC
40.0000 mg | DELAYED_RELEASE_TABLET | Freq: Every day | ORAL | Status: DC
  Filled 2017-10-15: qty 1

## 2017-10-15 MED ORDER — HYDROCHLOROTHIAZIDE 25 MG PO TABS
25.0000 mg | ORAL_TABLET | Freq: Every day | ORAL | Status: DC
  Administered 2017-10-16: 25 mg via ORAL
  Filled 2017-10-15: qty 1

## 2017-10-15 MED ORDER — POTASSIUM CHLORIDE ER 10 MEQ PO TBCR
10.0000 meq | EXTENDED_RELEASE_TABLET | Freq: Every day | ORAL | Status: DC
  Administered 2017-10-15: 20:00:00 10 meq via ORAL
  Filled 2017-10-15 (×3): qty 1

## 2017-10-15 MED ORDER — ASPIRIN 81 MG PO CHEW: 81.0000 mg | CHEWABLE_TABLET | ORAL | Status: DC

## 2017-10-15 MED ORDER — BIVALIRUDIN TRIFLUOROACETATE 250 MG IV SOLR
INTRAVENOUS | Status: AC
  Filled 2017-10-15: qty 250

## 2017-10-15 MED ORDER — ASPIRIN 81 MG PO CHEW
81.0000 mg | CHEWABLE_TABLET | Freq: Every day | ORAL | Status: DC
  Administered 2017-10-16: 09:00:00 81 mg via ORAL
  Filled 2017-10-15: qty 1

## 2017-10-15 MED ORDER — ATORVASTATIN CALCIUM 40 MG PO TABS
40.0000 mg | ORAL_TABLET | Freq: Every evening | ORAL | Status: DC
  Administered 2017-10-15: 20:00:00 40 mg via ORAL
  Filled 2017-10-15: qty 1

## 2017-10-15 MED ORDER — LIDOCAINE HCL (PF) 1 % IJ SOLN
INTRAMUSCULAR | Status: DC | PRN
  Administered 2017-10-15: 20 mL

## 2017-10-15 MED ORDER — HEPARIN (PORCINE) IN NACL 2-0.9 UNIT/ML-% IJ SOLN
INTRAMUSCULAR | Status: AC | PRN
  Administered 2017-10-15 (×2): 500 mL via INTRA_ARTERIAL

## 2017-10-15 MED ORDER — FENTANYL CITRATE (PF) 100 MCG/2ML IJ SOLN
INTRAMUSCULAR | Status: DC | PRN
  Administered 2017-10-15: 50 ug via INTRAVENOUS

## 2017-10-15 MED ORDER — SODIUM CHLORIDE 0.9 % WEIGHT BASED INFUSION: 1.0000 mL/kg/h | INTRAVENOUS | Status: DC

## 2017-10-15 MED ORDER — SODIUM CHLORIDE 0.9% FLUSH: 3.0000 mL | Freq: Two times a day (BID) | INTRAVENOUS | Status: DC

## 2017-10-15 MED ORDER — NAPROXEN SODIUM 275 MG PO TABS
275.0000 mg | ORAL_TABLET | Freq: Two times a day (BID) | ORAL | Status: DC | PRN
  Filled 2017-10-15: qty 1

## 2017-10-15 MED ORDER — NITROGLYCERIN IN D5W 200-5 MCG/ML-% IV SOLN: 0.0000 ug/min | INTRAVENOUS | Status: DC

## 2017-10-15 MED ORDER — METOPROLOL TARTRATE 12.5 MG HALF TABLET
25.0000 mg | ORAL_TABLET | Freq: Two times a day (BID) | ORAL | Status: DC
Start: 2017-10-15 — End: 2017-10-16
  Administered 2017-10-15 (×2): 25 mg via ORAL
  Filled 2017-10-15 (×2): qty 2

## 2017-10-15 MED ORDER — LISINOPRIL 10 MG PO TABS
10.0000 mg | ORAL_TABLET | Freq: Every day | ORAL | Status: DC
  Administered 2017-10-15 – 2017-10-16 (×2): 10 mg via ORAL
  Filled 2017-10-15 (×2): qty 1

## 2017-10-15 MED ORDER — HEPARIN (PORCINE) IN NACL 2-0.9 UNIT/ML-% IJ SOLN
INTRAMUSCULAR | Status: AC
  Filled 2017-10-15: qty 1000

## 2017-10-15 MED ORDER — BIVALIRUDIN BOLUS VIA INFUSION - CUPID
INTRAVENOUS | Status: DC | PRN
  Administered 2017-10-15: 57.525 mg via INTRAVENOUS

## 2017-10-15 MED ORDER — IOPAMIDOL (ISOVUE-370) INJECTION 76%
INTRAVENOUS | Status: AC
  Filled 2017-10-15: qty 125

## 2017-10-15 MED ORDER — SODIUM CHLORIDE 0.9 % IV SOLN
INTRAVENOUS | Status: DC | PRN
  Administered 2017-10-15 (×2): 1.75 mg/kg/h via INTRAVENOUS

## 2017-10-15 MED ORDER — ONDANSETRON HCL 4 MG/2ML IJ SOLN: 4.0000 mg | Freq: Four times a day (QID) | INTRAMUSCULAR | Status: DC | PRN

## 2017-10-15 MED ORDER — LEVOTHYROXINE SODIUM 100 MCG PO TABS
100.0000 ug | ORAL_TABLET | Freq: Every day | ORAL | Status: DC
  Administered 2017-10-16: 100 ug via ORAL
  Filled 2017-10-15: qty 1

## 2017-10-15 MED ORDER — ASPIRIN 81 MG PO TABS: 81.0000 mg | ORAL_TABLET | Freq: Every day | ORAL | Status: DC

## 2017-10-15 MED ORDER — ACETAMINOPHEN 325 MG PO TABS: 650.0000 mg | ORAL_TABLET | ORAL | Status: DC | PRN

## 2017-10-15 MED ORDER — CLOPIDOGREL BISULFATE 75 MG PO TABS: 75.0000 mg | ORAL_TABLET | Freq: Every day | ORAL | Status: DC

## 2017-10-15 MED ORDER — IOPAMIDOL (ISOVUE-370) INJECTION 76%
INTRAVENOUS | Status: AC
  Filled 2017-10-15: qty 50

## 2017-10-15 MED ORDER — IOPAMIDOL (ISOVUE-370) INJECTION 76%
INTRAVENOUS | Status: DC | PRN
  Administered 2017-10-15: 235 mL via INTRA_ARTERIAL

## 2017-10-15 SURGICAL SUPPLY — 33 items
BALLN SAPPHIRE ~~LOC~~ 2.0X12 (BALLOONS) ×2 IMPLANT
BALLN SAPPHIRE ~~LOC~~ 2.5X12 (BALLOONS) ×2 IMPLANT
BALLN SAPPHIRE ~~LOC~~ 3.0X15 (BALLOONS) ×2 IMPLANT
BALLN WOLVERINE 2.75X10 (BALLOONS) ×2
BALLN WOLVERINE 2.75X15 (BALLOONS) ×2
BALLN ~~LOC~~ EMERGE MR 2.5X12 (BALLOONS) ×2
BALLOON WOLVERINE 2.75X10 (BALLOONS) ×1 IMPLANT
BALLOON WOLVERINE 2.75X15 (BALLOONS) ×1 IMPLANT
BALLOON ~~LOC~~ EMERGE MR 2.5X12 (BALLOONS) ×1 IMPLANT
CATH INFINITI 5 FR IM (CATHETERS) ×2 IMPLANT
CATH INFINITI 5FR MPB2 (CATHETERS) ×2 IMPLANT
CATH INFINITI 5FR MULTPACK ANG (CATHETERS) ×2 IMPLANT
CATH MICROGUIDE FINCRSS 150 CM (MICROCATHETER) ×1 IMPLANT
CATH OPTICROSS 40MHZ (CATHETERS) ×2 IMPLANT
CATHETER LAUNCHER 6FR JR4 SH (CATHETERS) ×2 IMPLANT
COVER PRB 48X5XTLSCP FOLD TPE (BAG) ×1 IMPLANT
COVER PROBE 5X48 (BAG) ×1
GUIDELINER 6F (CATHETERS) ×2 IMPLANT
GUIDEWIRE INQWIRE 1.5J.035X260 (WIRE) IMPLANT
INQWIRE 1.5J .035X260CM (WIRE)
KIT ENCORE 26 ADVANTAGE (KITS) ×2 IMPLANT
KIT HEART LEFT (KITS) ×2 IMPLANT
KIT HEMO VALVE WATCHDOG (MISCELLANEOUS) ×2 IMPLANT
MICROGUIDE FINECROSS 150 CM (MICROCATHETER) ×2
PACK CARDIAC CATHETERIZATION (CUSTOM PROCEDURE TRAY) ×2 IMPLANT
SHEATH AVANTI 11CM 5FR (SHEATH) ×2 IMPLANT
SHEATH AVANTI 11CM 6FR (SHEATH) ×2 IMPLANT
SHEATH RAIN RADIAL 21G 6FR (SHEATH) IMPLANT
SLED PULL BACK IVUS (MISCELLANEOUS) ×2 IMPLANT
TRANSDUCER W/STOPCOCK (MISCELLANEOUS) ×2 IMPLANT
TUBING CIL FLEX 10 FLL-RA (TUBING) ×2 IMPLANT
WIRE ASAHI PROWATER 180CM (WIRE) ×2 IMPLANT
WIRE EMERALD 3MM-J .035X150CM (WIRE) ×2 IMPLANT

## 2017-10-15 NOTE — Interval H&P Note (Signed)
Cath Lab Visit (complete for each Cath Lab visit)  Clinical Evaluation Leading to the Procedure:   ACS: No.  Non-ACS:    Anginal Classification: CCS II  Anti-ischemic medical therapy: Minimal Therapy (1 class of medications)  Non-Invasive Test Results: No non-invasive testing performed  Prior CABG: Previous CABG      History and Physical Interval Note:  10/15/2017 10:59 AM  Benjamin Harrison  has presented today for surgery, with the diagnosis of unstable angina, Hx of CABG  The various methods of treatment have been discussed with the patient and family. After consideration of risks, benefits and other options for treatment, the patient has consented to  Procedure(s): LEFT HEART CATH AND CORS/GRAFTS ANGIOGRAPHY (N/A) as a surgical intervention .  The patient's history has been reviewed, patient examined, no change in status, stable for surgery.  I have reviewed the patient's chart and labs.  Questions were answered to the patient's satisfaction.     Belva Crome III

## 2017-10-15 NOTE — Progress Notes (Signed)
Site area: right groin  Site Prior to Removal:  Level 0  Pressure Applied For 20 MINUTES    Minutes Beginning at 1525  Manual:   Yes.    Patient Status During Pull:  stable  Post Pull Groin Site:  Level 0  Post Pull Instructions Given:  Yes.    Post Pull Pulses Present:  Yes.    Dressing Applied:  Yes.    Comments:  Bedrest started at 15 until 1945. Bruise noted around sutures, no bruising at sheath site.

## 2017-10-16 ENCOUNTER — Encounter (HOSPITAL_COMMUNITY): Payer: Self-pay | Admitting: Interventional Cardiology

## 2017-10-16 ENCOUNTER — Other Ambulatory Visit: Payer: Self-pay

## 2017-10-16 DIAGNOSIS — I214 Non-ST elevation (NSTEMI) myocardial infarction: Secondary | ICD-10-CM | POA: Diagnosis not present

## 2017-10-16 DIAGNOSIS — I25700 Atherosclerosis of coronary artery bypass graft(s), unspecified, with unstable angina pectoris: Secondary | ICD-10-CM

## 2017-10-16 DIAGNOSIS — E7800 Pure hypercholesterolemia, unspecified: Secondary | ICD-10-CM

## 2017-10-16 DIAGNOSIS — I10 Essential (primary) hypertension: Secondary | ICD-10-CM | POA: Diagnosis not present

## 2017-10-16 DIAGNOSIS — I25710 Atherosclerosis of autologous vein coronary artery bypass graft(s) with unstable angina pectoris: Secondary | ICD-10-CM | POA: Diagnosis not present

## 2017-10-16 LAB — GLUCOSE, CAPILLARY
Glucose-Capillary: 80 mg/dL (ref 65–99)
Glucose-Capillary: 143 mg/dL — ABNORMAL HIGH (ref 65–99)

## 2017-10-16 LAB — BASIC METABOLIC PANEL
Anion gap: 8 (ref 5–15)
BUN: 13 mg/dL (ref 6–20)
CO2: 25 mmol/L (ref 22–32)
CREATININE: 1.01 mg/dL (ref 0.61–1.24)
Calcium: 9 mg/dL (ref 8.9–10.3)
Chloride: 107 mmol/L (ref 101–111)
GFR calc non Af Amer: >60 mL/min (ref >60)
Glucose, Bld: 123 mg/dL — ABNORMAL HIGH (ref 65–99)
POTASSIUM: 4.1 mmol/L (ref 3.5–5.1)
SODIUM: 140 mmol/L (ref 135–145)

## 2017-10-16 LAB — CBC
HCT: 38.2 % — ABNORMAL LOW (ref 39.0–52.0)
Hemoglobin: 12.3 g/dL — ABNORMAL LOW (ref 13.0–17.0)
MCH: 25.3 pg — AB (ref 26.0–34.0)
MCHC: 32.2 g/dL (ref 30.0–36.0)
MCV: 78.4 fL (ref 78.0–100.0)
PLATELETS: 164 10*3/uL (ref 150–400)
RBC: 4.87 MIL/uL (ref 4.22–5.81)
RDW: 16.5 % — ABNORMAL HIGH (ref 11.5–15.5)
WBC: 5.6 10*3/uL (ref 4.0–10.5)

## 2017-10-16 LAB — POCT ACTIVATED CLOTTING TIME: ACTIVATED CLOTTING TIME: 197 s

## 2017-10-16 MED ORDER — ISOSORBIDE MONONITRATE ER 60 MG PO TB24: 120.0000 mg | ORAL_TABLET | Freq: Every day | ORAL | Status: DC

## 2017-10-16 MED ORDER — METOPROLOL TARTRATE 25 MG PO TABS: 25.0000 mg | ORAL_TABLET | Freq: Two times a day (BID) | ORAL | Status: DC

## 2017-10-16 MED ORDER — ANGIOPLASTY BOOK
Freq: Once | Status: AC
  Administered 2017-10-16: 06:00:00
  Filled 2017-10-16: qty 1

## 2017-10-16 MED ORDER — ISOSORBIDE MONONITRATE ER 120 MG PO TB24: 120.0000 mg | ORAL_TABLET | Freq: Every day | ORAL | 0 refills | Status: DC

## 2017-10-16 MED ORDER — HEART ATTACK BOUNCING BOOK
Freq: Once | Status: AC
  Administered 2017-10-16: 06:00:00
  Filled 2017-10-16: qty 1

## 2017-10-16 MED ORDER — PANTOPRAZOLE SODIUM 40 MG PO TBEC: 40.0000 mg | DELAYED_RELEASE_TABLET | Freq: Every day | ORAL | 1 refills | Status: DC

## 2017-10-16 MED ORDER — PANTOPRAZOLE SODIUM 40 MG PO TBEC: 40.0000 mg | DELAYED_RELEASE_TABLET | Freq: Every day | ORAL | 3 refills | Status: DC

## 2017-10-16 MED ORDER — ISOSORBIDE MONONITRATE ER 120 MG PO TB24: 120.0000 mg | ORAL_TABLET | Freq: Every day | ORAL | 3 refills | Status: DC

## 2017-10-16 MED ORDER — METOPROLOL TARTRATE 25 MG PO TABS
50.0000 mg | ORAL_TABLET | Freq: Two times a day (BID) | ORAL | Status: DC
  Administered 2017-10-16: 50 mg via ORAL
  Filled 2017-10-16: qty 2

## 2017-10-16 MED FILL — Nitroglycerin IV Soln 200 MCG/ML in D5W: INTRAVENOUS | Qty: 250 | Status: AC

## 2017-10-16 MED FILL — Heparin Sodium (Porcine) 2 Unit/ML in Sodium Chloride 0.9%: INTRAMUSCULAR | Qty: 1000 | Status: AC

## 2017-10-16 NOTE — Discharge Summary (Signed)
Discharge Summary    Patient ID: Benjamin Harrison,  MRN: [id number], DOB/AGE: 01/02/1935 83 y.o.  Admit date: 10/15/2017 Discharge date: 10/16/2017  Primary Care Provider: Leonard Downing Primary Cardiologist: No primary care provider on file.  Discharge Diagnoses    Principal Problem:   NSTEMI (non-ST elevated myocardial infarction) [facility]) Active Problems:   Coronary artery disease   Hypertension   Hyperlipidemia   Diabetes mellitus ([place])   Abdominal aortic aneurysm (AAA) without rupture (HCC)   Angina pectoris (HCC)   Allergies Allergies  Allergen Reactions  . Codeine Nausea Only    Pt reports this happens off and on    Diagnostic Studies/Procedures    Cardiac catheterization 10/15/2017: Conclusion    Occluded saphenous vein graft to the right coronary.  Patent saphenous vein graft to the diagonal and to the obtuse marginal.  Patent LIMA to LAD.  Total occlusion of proximal to mid LAD.  Heavily stented RCA from proximal to distal vessel with multifocal high-grade in-stent restenosis.  Total occlusion of the circumflex beyond the first obtuse marginal.  PCI performed on the right coronary including Wolverine cutting balloon without any impact on the high-grade distal right coronary but with significant improvement in the 80% proximal stenosis within the stented segment.  Low normal left ventricular systolic function with EF 50-55%.  Mild inferior wall hypokinesis.  RECOMMENDATIONS:   Severe in-stent restenosis within the heavily stented native right coronary with non-dilatable lesions using noncompliant relatively oversize balloons and also scoring balloons.  IV nitroglycerin at 10 mcg/min as the patient is having mild residual discomfort after multiple attempts at right coronary PCI.  Suspect microembolization or atheroemboli as a source.  EKG postprocedure was nonischemic.  Sheath will be pulled later today.  Home in a.m. with only prudent  therapy for right coronary disease being medical therapy.  Should not be reoperated upon because of the small territory and wide patency of the remaining bypass grafts.    _____________   History of Present Illness     Benjamin Harrison is a 83 y.o. male CAD s/p CABG 2001 & multiple stenting afterwards, HTN, HLD, DM and hypothyroidism presents for discussion of 30-day event monitor.  Last cath 12/06/2015 when he admitted with [place]. His cath showed his previous triple vessel CAD s/p CABG with 3/4 patent grafts, RCA is stented in the mid and distal segments, and there were three areas of severe restenosis in the mid and distal RCA and a chronically occluded prox LAD with patent VG. He underwent successful PTCA with angiosculpt cutting balloon in the mid and distal RCA.   He was doing relatively well on cardiac stand point except intermittent angina episode that relieved with SL nitro when last seen by Dr. Acie Fredrickson 03/2017.  Recently patient dealing with a skipped beat and intermittent palpitation.  Patient here with 30-day event monitor that was prescribed by PCP.  Preliminary review showed frequent PVCs with bigeminy (report given to Dr. Acie Fredrickson for review).  Patient also has worsening anginal episode since last office visit.  He described chest heaviness radiating to his back.  This occurs intermittently.  Sometimes relieves with rest and sometimes required nitroglycerin.  Lately it is happening at rest as well.  He has noted intermittent lower extremity edema.  No orthopnea or PND.    Hospital Course     Consultants: None   1. Unstable angina: presented for outpatient cardiac catheterization 3/18. LHC revealed severe in-stent restenosis of RCA with non-dilatable lesions. PCI  of the RCA using wolverine cutting balloon was without impact on the high-grade dRCA but did have improvement in the 80% pRCA stenosis. EF was low normal at 50-55% with mild inferior wall hypokinesis. He was recommended for  ongoing medical therapy.  - Continued ASA, plavix, and statin (LDL at goal of <70) - Will increase imdur to 120mg  daily - can consider adding ranexa outpatient if angina persists  2. Frequent PVCs: patient with recent event monitor revealing frequent PVCs. Telemetry here also with episodes of  PVC every 4th beat.  - Continue metoprolol 25mg  BID   3. HTN: BP elevated on day of cardiac catheterization. Improved on day of discharge.  - Continue HCTZ, lisinopril, metoprolol, and imdur  _____________  Discharge Vitals Blood pressure (!) 161/82, pulse 61, temperature 97.8 F (36.6 C), temperature source Oral, resp. rate 14, height 5\' 4"  (1.626 m), weight 169 lb 12.1 oz (77 kg), SpO2 98 %.  Filed Weights   10/15/17 0759 10/16/17 0230  Weight: 169 lb (76.7 kg) 169 lb 12.1 oz (77 kg)   Physical exam on day of discharge:  GXQ:JJHERD in bed in no acute distress.   Neck:No JVD, no carotid bruits Cardiac: RRR, no murmurs, rubs, or gallops. R groin cath site without ecchymosis or hematoma Respiratory:Clear to auscultation bilaterally, mild expiratory wheeze; no rales/ rhonchi EY:CXKG, Soft, nontender, non-distended  YJ:EHUDJ LE edema; No deformity. Neuro:Nonfocal, moving all extremities spontaneously Psych: Normal affect    Labs & Radiologic Studies    CBC Recent Labs    10/16/17 0604  WBC 5.6  HGB 12.3*  HCT 38.2*  MCV 78.4  PLT 497   Basic Metabolic Panel Recent Labs    10/16/17 0604  NA 140  K 4.1  CL 107  CO2 25  GLUCOSE 123*  BUN 13  CREATININE 1.01  CALCIUM 9.0   Liver Function Tests No results for input(s): AST, ALT, ALKPHOS, BILITOT, PROT, ALBUMIN in the last 72 hours. No results for input(s): LIPASE, AMYLASE in the last 72 hours. Cardiac Enzymes No results for input(s): CKTOTAL, CKMB, CKMBINDEX, TROPONINI in the last 72 hours. BNP Invalid input(s): POCBNP D-Dimer No results for input(s): DDIMER in the last 72 hours. Hemoglobin A1C No results for  input(s): HGBA1C in the last 72 hours. Fasting Lipid Panel No results for input(s): CHOL, HDL, LDLCALC, TRIG, CHOLHDL, LDLDIRECT in the last 72 hours. Thyroid Function Tests No results for input(s): TSH, T4TOTAL, T3FREE, THYROIDAB in the last 72 hours.  Invalid input(s): FREET3 _____________  No results found. Disposition   Patient was seen and examined by Dr. [place] who deemed patient as stable for discharge. Follow-up has been arranged. Discharge medications as listed below.   Follow-up Plans & Appointments    Follow-up Information    Daune Perch, NP Follow up on 11/05/2017.   Specialty:  Nurse Practitioner Why:  Please arrive 15 minutes early for you 10:15am Cardiology appointment Contact information: [address] [place] [address] [phone]            Discharge Medications   Allergies as of 10/16/2017      Reactions   Codeine Nausea Only   Pt reports this happens off and on      Medication List    STOP taking these medications   metFORMIN 500 MG tablet Commonly known as:  GLUCOPHAGE   omeprazole 40 MG capsule Commonly known as:  PRILOSEC     TAKE these medications   ALEVE 220 MG Caps Generic  drug:  Naproxen Sodium Take 440 mg by mouth 2 (two) times daily as needed (for headache/pain.).   aspirin 81 MG tablet Take 1 tablet (81 mg total) by mouth daily.   atorvastatin 40 MG tablet Commonly known as:  LIPITOR TAKE 1 TABLET BY MOUTH  DAILY What changed:    how much to take  how to take this  when to take this   clopidogrel 75 MG tablet Commonly known as:  PLAVIX TAKE 1 TABLET BY MOUTH  DAILY   famotidine 10 MG chewable tablet Commonly known as:  PEPCID AC Chew 1 tablet (10 mg total) by mouth 2 (two) times daily as needed for heartburn. What changed:  when to take this   glimepiride 4 MG tablet Commonly known as:  AMARYL Take 4 mg by mouth 2 (two) times daily.   hydrochlorothiazide 25 MG tablet Commonly known as:   HYDRODIURIL Take 1 tablet (25 mg total) by mouth daily.   isosorbide mononitrate 120 MG 24 hr tablet Commonly known as:  IMDUR Take 1 tablet (120 mg total) by mouth daily. What changed:    medication strength  how much to take   KLOR-CON 10 10 MEQ tablet Generic drug:  potassium chloride TAKE 1 TABLET BY MOUTH EVERY DAY What changed:    how much to take  how to take this  when to take this   levothyroxine 100 MCG tablet Commonly known as:  SYNTHROID, LEVOTHROID Take 100 mcg by mouth daily before breakfast.   lisinopril 10 MG tablet Commonly known as:  PRINIVIL,ZESTRIL Take 1 tablet (10 mg total) by mouth daily.   metoprolol tartrate 25 MG tablet Commonly known as:  LOPRESSOR TAKE 1 TABLET BY MOUTH TWO  TIMES DAILY   pantoprazole 40 MG tablet Commonly known as:  PROTONIX Take 1 tablet (40 mg total) by mouth daily. Start taking on:  10/17/2017   SYSTANE FREE OP Place 1 drop into both eyes 3 (three) times daily as needed (for dry eyes.).        Aspirin prescribed at discharge?  Yes High Intensity Statin Prescribed? (Lipitor 40-80mg  or Crestor 20-40mg ): Yes Beta Blocker Prescribed? Yes For EF <40%, was ACEI/ARB Prescribed? Yes ADP Receptor Inhibitor Prescribed? (i.e. Plavix etc.-Includes Medically Managed Patients): Yes For EF <40%, Aldosterone Inhibitor Prescribed? No: NA - EF 50-55% Was EF assessed during THIS hospitalization? Yes Was Cardiac Rehab II ordered? (Included Medically managed Patients): Yes   Outstanding Labs/Studies   None  Duration of Discharge Encounter   Greater than 30 minutes including physician time.  Signed, Abigail Butts PA-C 10/16/2017, 9:14 AM

## 2017-10-16 NOTE — Progress Notes (Signed)
CARDIAC REHAB PHASE I   PRE:  Rate/Rhythm: 67 SR    BP: sitting 157/84    SaO2: 97 RA  MODE:  Ambulation: 140 ft   POST:  Rate/Rhythm: 67 SR    BP: sitting 190/88     SaO2:   Pt sts he lives with 1/10 chest pressure. He hardly notices this. Used gait belt with pt for ambulation as his right knee was quite unstable at times, better with distance. Rested x2 for short distance due to SOB and fatigue. Denied CP during walk however once sitting in recliner he began to have an increase in CP, up to 3-4/10. He declined NTG and it decreased back to 1/10 after 15 min. Pt sts he usually doesn't take NTG until CP is 6-7/10. Discussed restrictions, NTG, diet, activity as tolerated, and CRPII. Pt prefers to go to his church to exercise. We discussed seeing how he does at home but the safer option is CRPII (if he is able to exercise at all given angina). Will refer to [facility]. Pt would benefit from a rollator given his OA in knee, unsteadiness, and angina/SOB.  [id number]   [place], ACSM 10/16/2017 10:04 AM

## 2017-10-16 NOTE — Discharge Instructions (Signed)
PLEASE REMEMBER TO BRING ALL OF YOUR MEDICATIONS TO EACH OF YOUR FOLLOW-UP OFFICE VISITS.  PLEASE ATTEND ALL SCHEDULED FOLLOW-UP APPOINTMENTS.   Activity: Increase activity slowly as tolerated. You may shower, but no soaking baths (or swimming) for 1 week. No driving for 24 hours. No lifting over 5 lbs for 1 week. No sexual activity for 1 week.   You May Return to Work: in 1 week (if applicable)  Wound Care: You may wash cath site gently with soap and water. Keep cath site clean and dry. If you notice pain, swelling, bleeding or pus at your cath site, please call [phone].   Medication changes: - Please stop taking omeprazole for acid reduction. Omeprazole can decreased the effectiveness of plavix. This medication has been replaced by pantoprazole.  - Your imdur has been increased to120mg  daily to help with your angina

## 2017-10-16 NOTE — Care Management Note (Signed)
Case Management Note  Patient Details  Name: Benjamin Harrison MRN: [id number] Date of Birth: Dec 23, 1934  Subjective/Objective:   Pt presented for Nstemi-Plan for home with DME Rollator. CM did speak with patient and it is ok to utilize [facility] for DME.                  Action/Plan: Pt is aware of the wait for DME to be delivered to the room. Per [facility]- will bring DME to room within 35 to 40 minutes. Family in the room to transport patient home. No further needs from CM at this time.   Expected Discharge Date:  10/16/17               Expected Discharge Plan:  Home/Self Care  In-House Referral:  NA  Discharge planning Services  CM Consult  Post Acute Care Choice:  Durable Medical Equipment Choice offered to:  Patient  DME Arranged:  Walker rolling with seat DME Agency:  [place]:  NA [place] Agency:  NA  Status of Service:  Completed, signed off  If discussed at [place] of Stay Meetings, dates discussed:    Additional Comments:  Bethena Roys, RN 10/16/2017, 2:08 PM

## 2017-10-17 ENCOUNTER — Other Ambulatory Visit: Payer: Self-pay | Admitting: Cardiovascular Disease

## 2017-10-22 ENCOUNTER — Telehealth (HOSPITAL_COMMUNITY): Payer: Self-pay

## 2017-10-22 NOTE — Telephone Encounter (Signed)
Patients insurance is active and benefits verified through [facility] - $20.00 co-pay, no deductible, out of pocket amount of $4,400/$160.00 has been met, no co-insurance, and no pre-authorization is required. Passport/reference [phone]  Patient will be contacted for scheduling upon review by the RN Navigator.

## 2017-10-25 ENCOUNTER — Telehealth (HOSPITAL_COMMUNITY): Payer: Self-pay

## 2017-10-25 NOTE — Telephone Encounter (Signed)
Attempted to call patient in regards to Cardiac Rehab - lm on vm °

## 2017-10-30 ENCOUNTER — Other Ambulatory Visit: Payer: Self-pay | Admitting: Cardiovascular Disease

## 2017-10-30 MED ORDER — POTASSIUM CHLORIDE ER 10 MEQ PO TBCR: EXTENDED_RELEASE_TABLET | ORAL | 3 refills | Status: DC

## 2017-11-04 DIAGNOSIS — Z9861 Coronary angioplasty status: Secondary | ICD-10-CM | POA: Insufficient documentation

## 2017-11-04 NOTE — Progress Notes (Signed)
Cardiology Office Note:    Date:  11/05/2017   ID:  [name], DOB 1934-10-22, MRN [id number]  PCP:  Leonard Downing, MD  Cardiologist:  Mertie Moores, MD  Referring MD: Leonard Downing, *   Chief Complaint  Patient presents with  . Follow-up    chest pain, sob     History of Present Illness:    Benjamin Harrison is a 83 y.o. male with a past medical history significant for CAD S/P CABG 2001 and multiple stents afterwards, hypertension, hyperlipidemia, diabetes and hypothyroidism.  He has a history of cath in 11/2015 that showed 3/4 patent bypass grafts and PTCA with angioscope cutting balloon to mid-distal RCA.  Patient was recently seen on 10/01/17 by Robbie Lis, PA for follow-up of 30-day monitor placed by PCP for skipped beats and intermittent palpitations with finding of frequent PVCs and bigeminy.  At that office visit he was noted to have worsening anginal episodes with chest heaviness radiating to the back.  He was so noted to have lower extremity edema and pro BNP was 210 (within normal range).  Was arranged for cardiac catheterization on 10/16/17 that revealed occluded SVG to RCA.  Patent SVG to diagonal and obtuse marginal, Patent LIMA to LAD, total occlusion of proximal to mid LAD, heavily stented RCA with proximal to distal vessel and multifocal high-grade in-stent stenosis, total occlusion of the circumflex beyond the first OM. PCI performed on the right coronary including Wolverine cutting balloon without any impact on the high-grade distal right coronary but with significant improvement in the 80% proximal stenosis within the stented segment.  There was low normal LV systolic function with EF 50-55% with mild inferior wall hypokinesis.  Prudent therapy for the RCA was recommended to be medical therapy due to the small territory and wide patency of the remaining bypass grafts.  He continues on aspirin 81 mg, Plavix and statin.  Antianginal therapy was intensified  with an increase in imdur.  Heart rate was limiting for further titration of beta-blocker.  Consideration of Ranexa if angina persists. Pt continues to have intermittent chest tightness that occasionally radiates to the left shoulder or right shoulder. Occ occurs with activity and relieved with rest. Associated with mild dyspnea. Occ chest tightness occurs at night. Slightly better since his cath. He is walking at his church about 1 mile or using his stationary bike about 3 times per week with occ exertional symptoms that resolve with rest. He has not uased any SL NTG. He says that his symptoms do not significantly limit his usual activities.   He has no orthopnea or edema. Home BP's per log 110-140's/60's. No lightheadedness or dizziness. Still has occ palpitations. Drinks 2 cups coffee per day.    Past Medical History:  Diagnosis Date  . AAA (abdominal aortic aneurysm) ([place])   . CHF (congestive heart failure) ([place])   . Coronary artery disease    a. CABG 2001 with early failure of VG-RCA. b. Inf MI after Lexiscan 01/28/2009 s/p RCA stent. c. Stent to dRCA 02/24/09. d. DES to [place]  2012. e. NSTEMI s/p angioplasty for ISR of RCA 09/2011. f. 05/2012 Cath: LM 20-30p, LAD 100p, LCX min irregs, RCA patent prox stent, 19m ISR, VG->Diag nl w/ dzs in D1 prox to graft, VG->RCA 100, VG->OM nl, LIMA->LAD nl, Nl EF->Med Rx; g. 09/2014 low-risk MV.  Marland Kitchen Dyspnea   . GERD (gastroesophageal reflux disease)   . History of echocardiogram    a. 09/2014  Echo: EF 55-60%, mild basal-midinferior HK, triv AI.  Marland Kitchen Hyperlipidemia   . Hypertension   . Hypertensive heart disease   . Hypothyroidism   . Myocardial infarction ([place])   . Osteoarthritis    a.End-stage osteoarthritis, right knee, medial compartment  . Personal history of tobacco use   . Skin cancer   . Type II diabetes mellitus ([place])     Past Surgical History:  Procedure Laterality Date  . CARDIAC CATHETERIZATION    . CARDIAC CATHETERIZATION N/A 12/06/2015     Procedure: Left Heart Cath and Cors/Grafts Angiography;  Surgeon: Burnell Blanks, MD;  Location: [facility];  Service: Cardiovascular;  Laterality: N/A;  . CARDIAC CATHETERIZATION N/A 12/06/2015   Procedure: Coronary Balloon Angioplasty;  Surgeon: Burnell Blanks, MD;  Location: [facility];  Service: Cardiovascular;  Laterality: N/A;  . CORONARY ARTERY BYPASS GRAFT    . HERNIA REPAIR  1985  . INTRAVASCULAR ULTRASOUND/IVUS N/A 10/15/2017   Procedure: Intravascular Ultrasound/IVUS;  Surgeon: Belva Crome, MD;  Location: [facility];  Service: Cardiovascular;  Laterality: N/A;  . KNEE ARTHROSCOPY  1999 and 2004  . LEFT HEART CATH AND CORS/GRAFTS ANGIOGRAPHY N/A 10/15/2017   Procedure: LEFT HEART CATH AND CORS/GRAFTS ANGIOGRAPHY;  Surgeon: Belva Crome, MD;  Location: [facility];  Service: Cardiovascular;  Laterality: N/A;  . LEFT HEART CATHETERIZATION WITH CORONARY ANGIOGRAM N/A 10/09/2011   Procedure: LEFT HEART CATHETERIZATION WITH CORONARY ANGIOGRAM;  Surgeon: Peter M Martinique, MD;  Location[facility];  Service: Cardiovascular;  Laterality: N/A;  . LEFT HEART CATHETERIZATION WITH CORONARY/GRAFT ANGIOGRAM N/A 06/06/2012   Procedure: LEFT HEART CATHETERIZATION WITH Beatrix Fetters;  Surgeon: Peter M Martinique, MD;  Location: [facility] CATH LAB;  Service: Cardiovascular;  Laterality: N/A;    Current Medications: Current Meds  Medication Sig  . aspirin 81 MG tablet Take 1 tablet (81 mg total) by mouth daily.  Marland Kitchen atorvastatin (LIPITOR) 40 MG tablet TAKE 1 TABLET BY MOUTH  DAILY (Patient taking differently: TAKE 1 TABLET BY MOUTH  DAILY IN THE EVENING.)  . clopidogrel (PLAVIX) 75 MG tablet TAKE 1 TABLET BY MOUTH  DAILY  . glimepiride (AMARYL) 4 MG tablet Take 4 mg by mouth 2 (two) times daily.   . hydrochlorothiazide (HYDRODIURIL) 25 MG tablet Take 1 tablet (25 mg total) by mouth daily.  . isosorbide mononitrate (IMDUR) 120 MG 24 hr tablet Take 1 tablet  (120 mg total) by mouth daily.  Marland Kitchen levothyroxine (SYNTHROID, LEVOTHROID) 100 MCG tablet Take 100 mcg by mouth daily before breakfast.   . lisinopril (PRINIVIL,ZESTRIL) 10 MG tablet Take 1 tablet (10 mg total) by mouth daily.  . metoprolol tartrate (LOPRESSOR) 25 MG tablet TAKE 1 TABLET BY MOUTH TWO  TIMES DAILY  . Naproxen Sodium (ALEVE) 220 MG CAPS Take 440 mg by mouth 2 (two) times daily as needed (for headache/pain.).  Marland Kitchen pantoprazole (PROTONIX) 40 MG tablet Take 1 tablet (40 mg total) by mouth daily.  Vladimir Faster Glycol-Propyl Glycol (SYSTANE FREE OP) Place 1 drop into both eyes 3 (three) times daily as needed (for dry eyes.).   Marland Kitchen potassium chloride (KLOR-CON 10) 10 MEQ tablet TAKE 1 TABLET BY MOUTH EVERY DAY AT NIGHT     Allergies:   Codeine   Social History   Socioeconomic History  . Marital status: Married    Spouse name: Not on file  . Number of children: Not on file  . Years of education: Not on file  .  Highest education level: Not on file  Occupational History  . Not on file  Social Needs  . Financial resource strain: Not on file  . Food insecurity:    Worry: Not on file    Inability: Not on file  . Transportation needs:    Medical: Not on file    Non-medical: Not on file  Tobacco Use  . Smoking status: Former Smoker    Last attempt to quit: 08/01/1975    Years since quitting: 42.2  . Smokeless tobacco: Never Used  Substance and Sexual Activity  . Alcohol use: No  . Drug use: No  . Sexual activity: Never  Lifestyle  . Physical activity:    Days per week: Not on file    Minutes per session: Not on file  . Stress: Not on file  Relationships  . Social connections:    Talks on phone: Not on file    Gets together: Not on file    Attends religious service: Not on file    Active member of club or organization: Not on file    Attends meetings of clubs or organizations: Not on file    Relationship status: Not on file  Other Topics Concern  . Not on file  Social  History Narrative   Lives locally with wife.     Family History: The patient's family history includes Heart disease in his brother and brother; Hypertension in his mother; Pneumonia in his brother; Prostate cancer in his brother; Throat cancer in his sister; Unexplained death in his brother, brother, and mother. ROS:   Please see the history of present illness.     All other systems reviewed and are negative.  EKGs/Labs/Other Studies Reviewed:    The following studies were reviewed today: Cardiac catheterization 10/15/2017: Conclusion   Occluded saphenous vein graft to the right coronary.  Patent saphenous vein graft to the diagonal and to the obtuse marginal.  Patent LIMA to LAD.  Total occlusion of proximal to mid LAD.  Heavily stented RCA from proximal to distal vessel with multifocal high-grade in-stent restenosis.  Total occlusion of the circumflex beyond the first obtuse marginal.  PCI performed on the right coronary including Wolverine cutting balloon without any impact on the high-grade distal right coronary but with significant improvement in the 80% proximal stenosis within the stented segment.  Low normal left ventricular systolic function with EF 50-55%. Mild inferior wall hypokinesis.  RECOMMENDATIONS:  Severe in-stent restenosis within the heavily stented native right coronary with non-dilatable lesions using noncompliant relatively oversize balloons and also scoring balloons.  IV nitroglycerin at 10 mcg/min as the patient is having mild residual discomfort after multiple attempts at right coronary PCI. Suspect microembolization or atheroemboli as a source. EKG postprocedure was nonischemic.  Sheath will be pulled later today.  Home in a.m. with only prudent therapy for right coronary disease being medical therapy. Should not be reoperated upon because of the small territory and wide patency of the remaining bypass grafts.   Check event monitor 08/2017:  Infrequent PVCs and bigeminy     EKG:  EKG is  ordered today.  The ekg ordered today demonstrates sinus bradycardia, 59 bpm, no significant changes  Recent Labs: 04/27/2017: ALT 23 10/11/2017: NT-Pro BNP 210 10/16/2017: BUN 13; Creatinine, Ser 1.01; Hemoglobin 12.3; Platelets 164; Potassium 4.1; Sodium 140   Recent Lipid Panel    Component Value Date/Time   CHOL 114 04/27/2017 0851   TRIG 168 (H) 04/27/2017 0851   HDL 26 (L)  04/27/2017 0851   CHOLHDL 4.4 04/27/2017 0851   CHOLHDL 4.1 03/22/2016 0959   VLDL 23 03/22/2016 0959   LDLCALC 54 04/27/2017 0851    Physical Exam:    VS:  BP 122/72   Pulse (!) 59   Ht 5\' 4"  (1.626 m)   Wt 165 lb (74.8 kg)   BMI 28.32 kg/m     Wt Readings from Last 3 Encounters:  11/05/17 165 lb (74.8 kg)  10/16/17 169 lb 12.1 oz (77 kg)  10/11/17 169 lb (76.7 kg)     Physical Exam  Constitutional: He is oriented to person, place, and time. He appears well-developed and well-nourished. No distress.  HENT:  Head: Normocephalic and atraumatic.  Neck: Normal range of motion. Neck supple. No JVD present.  Cardiovascular: Normal rate, regular rhythm and normal heart sounds. Exam reveals no gallop and no friction rub.  No murmur heard. Pulmonary/Chest: Effort normal and breath sounds normal. No respiratory distress. He has no wheezes. He has no rales.  Abdominal: Soft. Bowel sounds are normal. He exhibits no distension. There is no tenderness.  Musculoskeletal: Normal range of motion. He exhibits no edema or deformity.  Neurological: He is alert and oriented to person, place, and time.  Skin: Skin is warm and dry.  Psychiatric: He has a normal mood and affect. His behavior is normal. Thought content normal.   Right groin cath site healed.    ASSESSMENT:    1. Coronary artery disease of bypass graft of native heart with stable angina pectoris ([place])   2. S/P PTCA (percutaneous transluminal coronary angioplasty)   3. Essential hypertension   4.  Pure hypercholesterolemia    PLAN:    In order of problems listed above:   CAD S/P CABG(2001): Recent anginal symptoms with increased frequency.  LHC on 10/15/17 revealed severe in-stent restenosis of RCA with nondilated lesions.  PCI of the RCA using Wolverine cutting balloon without impact on the high-grade distal RCA but did have improvement in the 80% proximal RCA stenosis.  Medical therapy is continued with aspirin, ACE-I, HCTZ, beta-blocker, statin.  Imdur was increased to 120 mg daily and tolerating well. Still having intermittent chest tightness with mild dyspnea. Will start Ranexa 500 mg BID. Can increase in a few weeks if not improvement. Pt will call. (Also will be with his wife at her appointment at the end of this month with Dr. Acie Fredrickson).   Hypertension: BP well controlled per home BP log. Continue hydrochlorothiazide, lisinopril, metoprolol and Imdur  Hyperlipidemia: LDL goal <70.  LDL was 54 in 03/2017.  Continue atorvastatin 40 mg daily  Frequent PVCs: None noted on exam. Asymptomatic except for feeling occ palpitation. Continues on metoprolol 25 mg twice daily.  No room to titrate up beta-blocker due to heart rate. Advised to cut back on caffeine intake.   Diabetes type 2: Unable to take metformin due to loose bowel movements.  Recent  A1c <7 at PCP per pt report.   GERD: Improved with switching  Pepcid to protonix. When not on therapy he says that he has significant reflux. Continue protonix.     Medication Adjustments/Labs and Tests Ordered: Current medicines are reviewed at length with the patient today.  Concerns regarding medicines are outlined above. Labs and tests ordered and medication changes are outlined in the patient instructions below:  Patient Instructions  Medication Instructions:  1. START RANEXA 500 MG 1 TABLE TWICE DAILY; YOU HAVE BEEN GIVEN SOME SAMPLES TODAY WITH RX SENT TO [facility]  Labwork: NONE ORDERED TODAY  Testing/Procedures: NONE ORDERED  TODAY  Follow-Up: DR. Acie Fredrickson IN 6 MONTHS   Any Other Special Instructions Will Be Listed Below (If Applicable).     If you need a refill on your cardiac medications before your next appointment, please call your pharmacy.      Signed, Daune Perch, NP  11/05/2017 11:14 AM    [place]

## 2017-11-05 ENCOUNTER — Telehealth (HOSPITAL_COMMUNITY): Payer: Self-pay

## 2017-11-05 ENCOUNTER — Encounter (HOSPITAL_COMMUNITY): Payer: Self-pay

## 2017-11-05 ENCOUNTER — Encounter: Payer: Self-pay | Admitting: Cardiology

## 2017-11-05 ENCOUNTER — Ambulatory Visit: Payer: Medicare Other | Admitting: Cardiology

## 2017-11-05 VITALS — BP 122/72 | HR 59 | Ht 64.0 in | Wt 165.0 lb

## 2017-11-05 DIAGNOSIS — E7800 Pure hypercholesterolemia, unspecified: Secondary | ICD-10-CM | POA: Diagnosis not present

## 2017-11-05 DIAGNOSIS — I25708 Atherosclerosis of coronary artery bypass graft(s), unspecified, with other forms of angina pectoris: Secondary | ICD-10-CM

## 2017-11-05 DIAGNOSIS — Z9861 Coronary angioplasty status: Secondary | ICD-10-CM

## 2017-11-05 DIAGNOSIS — I10 Essential (primary) hypertension: Secondary | ICD-10-CM | POA: Diagnosis not present

## 2017-11-05 NOTE — Telephone Encounter (Signed)
2nd attempt to call patient in regards to Cardiac Rehab - lm on vm. Sending letter. °

## 2017-11-05 NOTE — Patient Instructions (Signed)
Medication Instructions:  1. START RANEXA 500 MG 1 TABLE TWICE DAILY; YOU HAVE BEEN GIVEN SOME SAMPLES TODAY WITH RX SENT TO [facility]  Labwork: NONE ORDERED TODAY  Testing/Procedures: NONE ORDERED TODAY  Follow-Up: DR. Acie Fredrickson IN 6 MONTHS   Any Other Special Instructions Will Be Listed Below (If Applicable).     If you need a refill on your cardiac medications before your next appointment, please call your pharmacy.

## 2017-11-05 NOTE — Addendum Note (Signed)
Addended by: Michae Kava on: 11/05/2017 02:49 PM   Modules accepted: Orders

## 2017-11-12 ENCOUNTER — Telehealth (HOSPITAL_COMMUNITY): Payer: Self-pay

## 2017-11-12 NOTE — Telephone Encounter (Signed)
Called patient to see if he is interested in the Cardiac Rehab Program - Patient stated he is not interested. Closed referral.

## 2017-11-20 ENCOUNTER — Telehealth: Payer: Self-pay

## 2017-11-20 NOTE — Telephone Encounter (Addendum)
**Note De-Identified Lark Runk Obfuscation** The pt states that [facility] advised him that a 90 day supply of Ranexa is going to cost him $125. The pt states that he cannot not afford that amount and is requesting that Ranexa be changed to a less expensive medication. He is requesting a call back today as he is almost out of Ranexa.  Per [facility] (Pts local pharmacy) a PA is not needed through the pts insurance and a tier exception is unlikely as Ranexa is not on the plans formulary.  The pt is aware that I am forwarding this message to Daune Perch, NP for advisement.

## 2017-11-20 NOTE — Telephone Encounter (Signed)
Spoke with Benjamin Harrison , she states the Ranexa is his only option because pt already takes 120 mg of isosorbide, Spoke with pt and he verbalized understanding and decided to stay on the Ranexa, Sent script to pharmacy.

## 2017-11-23 ENCOUNTER — Ambulatory Visit: Payer: Medicare Other | Admitting: Cardiovascular Disease

## 2018-01-13 ENCOUNTER — Other Ambulatory Visit: Payer: Self-pay

## 2018-01-13 ENCOUNTER — Emergency Department (HOSPITAL_COMMUNITY): Payer: Medicare Other

## 2018-01-13 ENCOUNTER — Observation Stay (HOSPITAL_BASED_OUTPATIENT_CLINIC_OR_DEPARTMENT_OTHER): Payer: Medicare Other

## 2018-01-13 ENCOUNTER — Observation Stay (HOSPITAL_COMMUNITY)
Admission: EM | Admit: 2018-01-13 | Discharge: 2018-01-14 | Disposition: A | Discharge status: Home / Self Care | Payer: Medicare Other | Attending: Internal Medicine | Admitting: Internal Medicine

## 2018-01-13 ENCOUNTER — Encounter (HOSPITAL_COMMUNITY): Payer: Self-pay | Admitting: Emergency Medicine

## 2018-01-13 DIAGNOSIS — I130 Hypertensive heart and chronic kidney disease with heart failure and stage 1 through stage 4 chronic kidney disease, or unspecified chronic kidney disease: Secondary | ICD-10-CM | POA: Diagnosis not present

## 2018-01-13 DIAGNOSIS — Z85828 Personal history of other malignant neoplasm of skin: Secondary | ICD-10-CM | POA: Diagnosis not present

## 2018-01-13 DIAGNOSIS — I25119 Atherosclerotic heart disease of native coronary artery with unspecified angina pectoris: Principal | ICD-10-CM | POA: Insufficient documentation

## 2018-01-13 DIAGNOSIS — R079 Chest pain, unspecified: Secondary | ICD-10-CM | POA: Diagnosis present

## 2018-01-13 DIAGNOSIS — I252 Old myocardial infarction: Secondary | ICD-10-CM | POA: Insufficient documentation

## 2018-01-13 DIAGNOSIS — Z87891 Personal history of nicotine dependence: Secondary | ICD-10-CM | POA: Insufficient documentation

## 2018-01-13 DIAGNOSIS — I209 Angina pectoris, unspecified: Secondary | ICD-10-CM | POA: Diagnosis present

## 2018-01-13 DIAGNOSIS — Y831 Surgical operation with implant of artificial internal device as the cause of abnormal reaction of the patient, or of later complication, without mention of misadventure at the time of the procedure: Secondary | ICD-10-CM | POA: Diagnosis not present

## 2018-01-13 DIAGNOSIS — N182 Chronic kidney disease, stage 2 (mild): Secondary | ICD-10-CM | POA: Diagnosis not present

## 2018-01-13 DIAGNOSIS — Z79899 Other long term (current) drug therapy: Secondary | ICD-10-CM | POA: Insufficient documentation

## 2018-01-13 DIAGNOSIS — T82858A Stenosis of vascular prosthetic devices, implants and grafts, initial encounter: Secondary | ICD-10-CM | POA: Diagnosis not present

## 2018-01-13 DIAGNOSIS — I714 Abdominal aortic aneurysm, without rupture, unspecified: Secondary | ICD-10-CM | POA: Diagnosis present

## 2018-01-13 DIAGNOSIS — Z951 Presence of aortocoronary bypass graft: Secondary | ICD-10-CM | POA: Insufficient documentation

## 2018-01-13 DIAGNOSIS — I509 Heart failure, unspecified: Secondary | ICD-10-CM | POA: Insufficient documentation

## 2018-01-13 DIAGNOSIS — Z7902 Long term (current) use of antithrombotics/antiplatelets: Secondary | ICD-10-CM | POA: Insufficient documentation

## 2018-01-13 DIAGNOSIS — E1122 Type 2 diabetes mellitus with diabetic chronic kidney disease: Secondary | ICD-10-CM | POA: Diagnosis not present

## 2018-01-13 DIAGNOSIS — Z7984 Long term (current) use of oral hypoglycemic drugs: Secondary | ICD-10-CM | POA: Insufficient documentation

## 2018-01-13 DIAGNOSIS — Z7982 Long term (current) use of aspirin: Secondary | ICD-10-CM | POA: Insufficient documentation

## 2018-01-13 DIAGNOSIS — N179 Acute kidney failure, unspecified: Secondary | ICD-10-CM | POA: Diagnosis not present

## 2018-01-13 DIAGNOSIS — R112 Nausea with vomiting, unspecified: Secondary | ICD-10-CM | POA: Diagnosis not present

## 2018-01-13 DIAGNOSIS — I352 Nonrheumatic aortic (valve) stenosis with insufficiency: Secondary | ICD-10-CM

## 2018-01-13 DIAGNOSIS — E039 Hypothyroidism, unspecified: Secondary | ICD-10-CM | POA: Insufficient documentation

## 2018-01-13 DIAGNOSIS — E785 Hyperlipidemia, unspecified: Secondary | ICD-10-CM | POA: Insufficient documentation

## 2018-01-13 DIAGNOSIS — K219 Gastro-esophageal reflux disease without esophagitis: Secondary | ICD-10-CM | POA: Diagnosis present

## 2018-01-13 DIAGNOSIS — Z955 Presence of coronary angioplasty implant and graft: Secondary | ICD-10-CM | POA: Diagnosis not present

## 2018-01-13 DIAGNOSIS — R197 Diarrhea, unspecified: Secondary | ICD-10-CM | POA: Insufficient documentation

## 2018-01-13 DIAGNOSIS — I340 Nonrheumatic mitral (valve) insufficiency: Secondary | ICD-10-CM | POA: Diagnosis not present

## 2018-01-13 DIAGNOSIS — R1110 Vomiting, unspecified: Secondary | ICD-10-CM

## 2018-01-13 DIAGNOSIS — Z79890 Hormone replacement therapy (postmenopausal): Secondary | ICD-10-CM | POA: Diagnosis not present

## 2018-01-13 DIAGNOSIS — Z8249 Family history of ischemic heart disease and other diseases of the circulatory system: Secondary | ICD-10-CM | POA: Insufficient documentation

## 2018-01-13 LAB — CBC
HCT: 49.2 % (ref 39.0–52.0)
Hemoglobin: 15.4 g/dL (ref 13.0–17.0)
MCH: 24.3 pg — AB (ref 26.0–34.0)
MCHC: 31.3 g/dL (ref 30.0–36.0)
MCV: 77.6 fL — ABNORMAL LOW (ref 78.0–100.0)
PLATELETS: 243 10*3/uL (ref 150–400)
RBC: 6.34 MIL/uL — ABNORMAL HIGH (ref 4.22–5.81)
RDW: 16.4 % — AB (ref 11.5–15.5)
WBC: 7.4 10*3/uL (ref 4.0–10.5)

## 2018-01-13 LAB — BASIC METABOLIC PANEL
Anion gap: 12 (ref 5–15)
BUN: 26 mg/dL — AB (ref 6–20)
CALCIUM: 8.9 mg/dL (ref 8.9–10.3)
CO2: 24 mmol/L (ref 22–32)
CREATININE: 1.38 mg/dL — AB (ref 0.61–1.24)
Chloride: 101 mmol/L (ref 101–111)
GFR calc Af Amer: 54 mL/min — ABNORMAL LOW (ref >60)
GFR, EST NON AFRICAN AMERICAN: 46 mL/min — AB (ref >60)
GLUCOSE: 228 mg/dL — AB (ref 65–99)
Potassium: 4.4 mmol/L (ref 3.5–5.1)
Sodium: 137 mmol/L (ref 135–145)

## 2018-01-13 LAB — CBG MONITORING, ED: Glucose-Capillary: 164 mg/dL — ABNORMAL HIGH (ref 65–99)

## 2018-01-13 LAB — GLUCOSE, CAPILLARY
GLUCOSE-CAPILLARY: 208 mg/dL — AB (ref 65–99)
GLUCOSE-CAPILLARY: 67 mg/dL (ref 65–99)
GLUCOSE-CAPILLARY: 68 mg/dL (ref 65–99)
Glucose-Capillary: 110 mg/dL — ABNORMAL HIGH (ref 65–99)

## 2018-01-13 LAB — I-STAT TROPONIN, ED: TROPONIN I, POC: 0 ng/mL (ref 0.00–0.08)

## 2018-01-13 LAB — TROPONIN I: Troponin I: <0.03 ng/mL (ref <0.03)

## 2018-01-13 LAB — HEMOGLOBIN A1C
HEMOGLOBIN A1C: 6.7 % — AB (ref 4.8–5.6)
MEAN PLASMA GLUCOSE: 145.59 mg/dL

## 2018-01-13 MED ORDER — METOPROLOL TARTRATE 25 MG PO TABS
25.0000 mg | ORAL_TABLET | Freq: Two times a day (BID) | ORAL | Status: DC
  Administered 2018-01-13 – 2018-01-14 (×2): 25 mg via ORAL
  Filled 2018-01-13 (×3): qty 1

## 2018-01-13 MED ORDER — CLOPIDOGREL BISULFATE 75 MG PO TABS
75.0000 mg | ORAL_TABLET | Freq: Every day | ORAL | Status: DC
  Administered 2018-01-13 – 2018-01-14 (×2): 75 mg via ORAL
  Filled 2018-01-13 (×2): qty 1

## 2018-01-13 MED ORDER — ACETAMINOPHEN 325 MG PO TABS
650.0000 mg | ORAL_TABLET | Freq: Four times a day (QID) | ORAL | Status: DC | PRN
  Administered 2018-01-14 (×2): 650 mg via ORAL
  Filled 2018-01-13 (×2): qty 2

## 2018-01-13 MED ORDER — LEVOTHYROXINE SODIUM 100 MCG PO TABS
100.0000 ug | ORAL_TABLET | Freq: Every day | ORAL | Status: DC
  Administered 2018-01-14: 100 ug via ORAL
  Filled 2018-01-13: qty 1

## 2018-01-13 MED ORDER — ASPIRIN 81 MG PO CHEW
324.0000 mg | CHEWABLE_TABLET | Freq: Once | ORAL | Status: AC
Start: 2018-01-13 — End: 2018-01-13
  Administered 2018-01-13: 324 mg via ORAL
  Filled 2018-01-13: qty 4

## 2018-01-13 MED ORDER — SENNOSIDES-DOCUSATE SODIUM 8.6-50 MG PO TABS: 1.0000 | ORAL_TABLET | Freq: Every evening | ORAL | Status: DC | PRN

## 2018-01-13 MED ORDER — ISOSORBIDE MONONITRATE ER 60 MG PO TB24
120.0000 mg | ORAL_TABLET | Freq: Every day | ORAL | Status: DC
  Administered 2018-01-13 – 2018-01-14 (×2): 120 mg via ORAL
  Filled 2018-01-13: qty 2
  Filled 2018-01-13: qty 4

## 2018-01-13 MED ORDER — ACETAMINOPHEN 500 MG PO TABS
1000.0000 mg | ORAL_TABLET | Freq: Once | ORAL | Status: AC
  Administered 2018-01-13: 1000 mg via ORAL
  Filled 2018-01-13: qty 2

## 2018-01-13 MED ORDER — ONDANSETRON HCL 4 MG/2ML IJ SOLN: 4.0000 mg | Freq: Four times a day (QID) | INTRAMUSCULAR | Status: DC | PRN

## 2018-01-13 MED ORDER — GI COCKTAIL ~~LOC~~: 30.0000 mL | Freq: Three times a day (TID) | ORAL | Status: DC | PRN

## 2018-01-13 MED ORDER — ATORVASTATIN CALCIUM 40 MG PO TABS
40.0000 mg | ORAL_TABLET | Freq: Every evening | ORAL | Status: DC
  Administered 2018-01-13: 40 mg via ORAL
  Filled 2018-01-13: qty 1

## 2018-01-13 MED ORDER — DEXTROSE 5 % IV SOLN
INTRAVENOUS | Status: DC
  Administered 2018-01-13 – 2018-01-14 (×2): via INTRAVENOUS

## 2018-01-13 MED ORDER — ACETAMINOPHEN 650 MG RE SUPP: 650.0000 mg | Freq: Four times a day (QID) | RECTAL | Status: DC | PRN

## 2018-01-13 MED ORDER — GI COCKTAIL ~~LOC~~
30.0000 mL | Freq: Once | ORAL | Status: AC
  Administered 2018-01-13: 30 mL via ORAL
  Filled 2018-01-13: qty 30

## 2018-01-13 MED ORDER — ENOXAPARIN SODIUM 40 MG/0.4ML ~~LOC~~ SOLN: 40.0000 mg | SUBCUTANEOUS | Status: DC

## 2018-01-13 MED ORDER — DEXTROSE 50 % IV SOLN
INTRAVENOUS | Status: AC
  Administered 2018-01-13: 50 mL
  Filled 2018-01-13: qty 50

## 2018-01-13 MED ORDER — SODIUM CHLORIDE 0.9 % IV BOLUS
1000.0000 mL | Freq: Once | INTRAVENOUS | Status: AC
  Administered 2018-01-13: 1000 mL via INTRAVENOUS

## 2018-01-13 MED ORDER — ONDANSETRON HCL 4 MG/2ML IJ SOLN
4.0000 mg | Freq: Once | INTRAMUSCULAR | Status: AC
  Administered 2018-01-13: 4 mg via INTRAVENOUS
  Filled 2018-01-13: qty 2

## 2018-01-13 MED ORDER — INSULIN ASPART 100 UNIT/ML ~~LOC~~ SOLN
0.0000 [IU] | Freq: Three times a day (TID) | SUBCUTANEOUS | Status: DC
  Administered 2018-01-13: 2 [IU] via SUBCUTANEOUS
  Filled 2018-01-13: qty 1

## 2018-01-13 MED ORDER — SODIUM CHLORIDE 0.9 % IV BOLUS: 500.0000 mL | Freq: Once | INTRAVENOUS | Status: DC

## 2018-01-13 MED ORDER — PANTOPRAZOLE SODIUM 40 MG PO TBEC
40.0000 mg | DELAYED_RELEASE_TABLET | Freq: Two times a day (BID) | ORAL | Status: DC
  Administered 2018-01-13 – 2018-01-14 (×3): 40 mg via ORAL
  Filled 2018-01-13 (×3): qty 1

## 2018-01-13 MED ORDER — ENOXAPARIN SODIUM 40 MG/0.4ML ~~LOC~~ SOLN
40.0000 mg | SUBCUTANEOUS | Status: DC
  Administered 2018-01-13: 40 mg via SUBCUTANEOUS
  Filled 2018-01-13: qty 0.4

## 2018-01-13 MED ORDER — ONDANSETRON HCL 4 MG PO TABS: 4.0000 mg | ORAL_TABLET | Freq: Four times a day (QID) | ORAL | Status: DC | PRN

## 2018-01-13 MED ORDER — ASPIRIN 81 MG PO CHEW
81.0000 mg | CHEWABLE_TABLET | Freq: Every day | ORAL | Status: DC
  Administered 2018-01-14: 81 mg via ORAL
  Filled 2018-01-13: qty 1

## 2018-01-13 MED ORDER — DEXTROSE 50 % IV SOLN
INTRAVENOUS | Status: AC
  Administered 2018-01-13: 25 mL via INTRAVENOUS
  Filled 2018-01-13: qty 50

## 2018-01-13 MED ORDER — SODIUM CHLORIDE 0.9 % IV SOLN
INTRAVENOUS | Status: DC
  Administered 2018-01-13 (×2): via INTRAVENOUS

## 2018-01-13 MED ORDER — DEXTROSE 50 % IV SOLN
25.0000 mL | Freq: Once | INTRAVENOUS | Status: AC
  Administered 2018-01-13: 25 mL via INTRAVENOUS

## 2018-01-13 MED ORDER — PANTOPRAZOLE SODIUM 40 MG PO TBEC: 40.0000 mg | DELAYED_RELEASE_TABLET | Freq: Every day | ORAL | Status: DC

## 2018-01-13 NOTE — ED Notes (Signed)
ECHO at bedside, will transport pt to 2W after

## 2018-01-13 NOTE — ED Provider Notes (Signed)
[facility] EMERGENCY DEPARTMENT Provider Note   CSN: [id number] Arrival date & time: 01/13/18  0855     History   Chief Complaint Chief Complaint  Patient presents with  . Chest Pain    HPI Benjamin Harrison is a 83 y.o. male.  HPI   Patient is an 83 y.o.maleCAD s/p CABG 2001 & multiple stenting afterwards, HTN, HLD, DM and hypothyroidism presenting for chest pain, shortness of breath in the setting of acute GI illness of vomiting and diarrhea.  Patient reports that every couple months he experiences an acute diarrheal illness with vomiting, which she is experience for the past 2 days.  Patient denies any melena or hematochezia.  Patient reports feeling like he had a heart attack between 1-2 AM this morning.  Patient reports that ever since he is experience left-sided chest pain rating into the back as well as shortness of breath.  Patient denies being febrile during this illness.  Patient is unable to identify how many times he has vomited or had diarrhea due to numerous episodes.  Patient has been taking Zofran with minimal relief.  Patient denies taking any of his medications this morning.  Patient has not had any hospitalizations or surgeries within the last month.  Past Medical History:  Diagnosis Date  . AAA (abdominal aortic aneurysm) ([place])   . CHF (congestive heart failure) ([place])   . Coronary artery disease    a. CABG 2001 with early failure of VG-RCA. b. Inf MI after Lexiscan 01/28/2009 s/p RCA stent. c. Stent to dRCA 02/24/09. d. DES to [place]  2012. e. NSTEMI s/p angioplasty for ISR of RCA 09/2011. f. 05/2012 Cath: LM 20-30p, LAD 100p, LCX min irregs, RCA patent prox stent, 64m ISR, VG->Diag nl w/ dzs in D1 prox to graft, VG->RCA 100, VG->OM nl, LIMA->LAD nl, Nl EF->Med Rx; g. 09/2014 low-risk MV.  Marland Kitchen Dyspnea   . GERD (gastroesophageal reflux disease)   . History of echocardiogram    a. 09/2014 Echo: EF 55-60%, mild basal-midinferior HK, triv AI.  Marland Kitchen  Hyperlipidemia   . Hypertension   . Hypertensive heart disease   . Hypothyroidism   . Myocardial infarction ([place])   . Osteoarthritis    a.End-stage osteoarthritis, right knee, medial compartment  . Personal history of tobacco use   . Skin cancer   . Type II diabetes mellitus [facility])     Patient Active Problem List   Diagnosis Date Noted  . S/P PTCA (percutaneous transluminal coronary angioplasty) 11/04/2017  . Angina pectoris ([place]) 10/15/2017  . Abdominal aortic aneurysm (AAA) without rupture ([place]) 08/10/2016  . Acute on chronic kidney failure-II 08/09/2016  . GERD (gastroesophageal reflux disease) 08/08/2016  . Gastroenteritis 08/08/2016  . Sepsis ([place]) 08/08/2016  . Cough 08/08/2016  . Hypertensive heart disease   . Type II diabetes mellitus ([place])   . Thrombocytopenia ([place]) 06/08/2012  . NSTEMI (non-ST elevated myocardial infarction) ([place]) 10/09/2011  . Unstable angina ([place]) 10/08/2011  . Colitis 07/06/2011  . Diabetes mellitus ([place]) 07/05/2011  . Diarrhea 07/05/2011  . Weakness generalized 07/05/2011  . Fatigue 11/11/2010  . Coronary artery disease   . Hypertension   . Hypothyroidism   . Hyperlipidemia   . SOB (shortness of breath) on exertion   . Personal history of tobacco use     Past Surgical History:  Procedure Laterality Date  . CARDIAC CATHETERIZATION    . CARDIAC CATHETERIZATION N/A 12/06/2015   Procedure: Left Heart Cath and Cors/Grafts Angiography;  Surgeon: Burnell Blanks, MD;  Location: [facility] CV LAB;  Service: Cardiovascular;  Laterality: N/A;  . CARDIAC CATHETERIZATION N/A 12/06/2015   Procedure: Coronary Balloon Angioplasty;  Surgeon: Burnell Blanks, MD;  Location: [facility] CV LAB;  Service: Cardiovascular;  Laterality: N/A;  . CORONARY ARTERY BYPASS GRAFT    . HERNIA REPAIR  1985  . INTRAVASCULAR ULTRASOUND/IVUS N/A 10/15/2017   Procedure: Intravascular Ultrasound/IVUS;  Surgeon: Belva Crome, MD;  Location: [facility] CV LAB;   Service: Cardiovascular;  Laterality: N/A;  . KNEE ARTHROSCOPY  1999 and 2004  . LEFT HEART CATH AND CORS/GRAFTS ANGIOGRAPHY N/A 10/15/2017   Procedure: LEFT HEART CATH AND CORS/GRAFTS ANGIOGRAPHY;  Surgeon: Belva Crome, MD;  Location: [facility] CV LAB;  Service: Cardiovascular;  Laterality: N/A;  . LEFT HEART CATHETERIZATION WITH CORONARY ANGIOGRAM N/A 10/09/2011   Procedure: LEFT HEART CATHETERIZATION WITH CORONARY ANGIOGRAM;  Surgeon: Peter M Martinique, MD;  Location: [facility] CATH LAB;  Service: Cardiovascular;  Laterality: N/A;  . LEFT HEART CATHETERIZATION WITH CORONARY/GRAFT ANGIOGRAM N/A 06/06/2012   Procedure: LEFT HEART CATHETERIZATION WITH Beatrix Fetters;  Surgeon: Peter M Martinique, MD;  Location: [facility] CATH LAB;  Service: Cardiovascular;  Laterality: N/A;        Home Medications    Prior to Admission medications   Medication Sig Start Date End Date Taking? Authorizing Provider  acetaminophen (TYLENOL) 500 MG tablet Take 1,000 mg by mouth as needed for mild pain.   Yes [provider]  aspirin 81 MG tablet Take 1 tablet (81 mg total) by mouth daily. 10/10/11  Yes [name], NP  atorvastatin (LIPITOR) 40 MG tablet TAKE 1 TABLET BY MOUTH  DAILY Patient taking differently: TAKE 1 TABLET BY MOUTH  DAILY IN THE EVENING. 05/29/17  Yes [name], [name], MD  clopidogrel (PLAVIX) 75 MG tablet TAKE 1 TABLET BY MOUTH  DAILY 05/29/17  Yes [name], [name], MD  fluocinonide (LIDEX) 0.05 % external solution Apply 1 mL topically 2 (two) times daily. 12/24/17  Yes [provider]  glimepiride (AMARYL) 4 MG tablet Take 4 mg by mouth 2 (two) times daily.    Yes [provider]  hydrochlorothiazide (HYDRODIURIL) 25 MG tablet Take 1 tablet (25 mg total) by mouth daily. 12/19/13  Yes [name], [name], MD  isosorbide mononitrate (IMDUR) 120 MG 24 hr tablet Take 1 tablet (120 mg total) by mouth daily. 10/16/17  Yes [name], [name], PA-C  levothyroxine (SYNTHROID,  LEVOTHROID) 100 MCG tablet Take 100 mcg by mouth daily before breakfast.    Yes [provider]  lisinopril (PRINIVIL,ZESTRIL) 10 MG tablet Take 1 tablet (10 mg total) by mouth daily. 10/11/17 07/08/18 Yes Bhagat, Bhavinkumar, PA  metoprolol tartrate (LOPRESSOR) 25 MG tablet TAKE 1 TABLET BY MOUTH TWO  TIMES DAILY 05/29/17  Yes [name], [name], MD  Multiple Vitamins-Minerals (PRESERVISION AREDS PO) Take 1 tablet by mouth 2 (two) times daily.   Yes [provider]  Naproxen Sodium (ALEVE) 220 MG CAPS Take 440 mg by mouth 2 (two) times daily as needed (for headache/pain.).   Yes [provider]  pantoprazole (PROTONIX) 40 MG tablet Take 1 tablet (40 mg total) by mouth daily. 10/17/17  Yes [name], Lorelee Cover., PA-C  Polyethyl Glycol-Propyl Glycol (SYSTANE FREE OP) Place 1 drop into both eyes 3 (three) times daily as needed (for dry eyes.).    Yes [provider]  potassium chloride (KLOR-CON 10) 10 MEQ tablet TAKE 1 TABLET BY MOUTH EVERY  DAY AT NIGHT 10/30/17  Yes [name], [name], MD    Family History Family History  Problem Relation Age of Onset  . Hypertension Mother   . Unexplained death Mother   . Throat cancer Sister   . Heart disease Brother   . Pneumonia Brother   . Heart disease Brother   . Prostate cancer Brother   . Unexplained death Brother        at birth  . Unexplained death Brother     Social History Social History   Tobacco Use  . Smoking status: Former Smoker    Last attempt to quit: 08/01/1975    Years since quitting: 42.4  . Smokeless tobacco: Never Used  Substance Use Topics  . Alcohol use: No  . Drug use: No     Allergies   Codeine   Review of Systems Review of Systems  Constitutional: Negative for chills and fever.  HENT: Negative for congestion and rhinorrhea.   Eyes: Negative for visual disturbance.  Respiratory: Positive for chest tightness and shortness of breath. Negative for cough.   Cardiovascular: Positive for  chest pain. Negative for leg swelling.  Gastrointestinal: Positive for diarrhea, nausea and vomiting. Negative for abdominal pain.  Genitourinary: Negative for dysuria.  Musculoskeletal: Negative for back pain.  All other systems reviewed and are negative.    Physical Exam Updated Vital Signs BP 134/82 (BP Location: Right Arm)   Pulse 100   Temp 99.7 F (37.6 C) (Rectal)   Resp 16   SpO2 98%   Physical Exam  Constitutional: He appears well-developed and well-nourished. No distress.  HENT:  Head: Normocephalic and atraumatic.  Mouth/Throat: Oropharynx is clear and moist.  Eyes: Pupils are equal, round, and reactive to light. Conjunctivae and EOM are normal.  Neck: Normal range of motion. Neck supple.  Cardiovascular: Normal rate, regular rhythm, S1 normal and S2 normal.  No murmur heard. Pulses:      Radial pulses are 2+ on the right side, and 2+ on the left side.       Posterior tibial pulses are 2+ on the right side, and 2+ on the left side.  No lower extremity edema.  No calf tenderness.  Pulmonary/Chest: Effort normal and breath sounds normal. He has no wheezes. He has no rales.  Abdominal: Soft. He exhibits no distension. There is no tenderness. There is no guarding.  Musculoskeletal: Normal range of motion. He exhibits no edema or deformity.  Lymphadenopathy:    He has no cervical adenopathy.  Neurological: He is alert.  Cranial nerves grossly intact. Patient moves extremities symmetrically and with good coordination.  Skin: Skin is warm and dry. No rash noted. No erythema.  Psychiatric: He has a normal mood and affect. His behavior is normal. Judgment and thought content normal.  Nursing note and vitals reviewed.    ED Treatments / Results  Labs (all labs ordered are listed, but only abnormal results are displayed) Labs Reviewed  BASIC METABOLIC PANEL - Abnormal; Notable for the following components:      Result Value   Glucose, Bld 228 (*)    BUN 26 (*)     Creatinine, Ser 1.38 (*)    GFR calc non Af Amer 46 (*)    GFR calc Af Amer 54 (*)    All other components within normal limits  CBC - Abnormal; Notable for the following components:   RBC 6.34 (*)    MCV 77.6 (*)    MCH 24.3 (*)  RDW 16.4 (*)    All other components within normal limits  I-STAT TROPONIN, ED    EKG EKG Interpretation  Date/Time:  Sunday January 13 2018 10:09:38 EDT Ventricular Rate:  98 PR Interval:  152 QRS Duration: 74 QT Interval:  375 QTC Calculation: 479 R Axis:   7 Text Interpretation:  Sinus tachycardia Atrial premature complexes Inferior infarct, old t-wave flattening lateral leads Confirmed by Blanchie Dessert [phone]) on 01/13/2018 10:18:23 AM   Radiology Dg Chest 2 View  Result Date: 01/13/2018 CLINICAL DATA:  Acute onset left chest heaviness radiating to the back today. EXAM: CHEST - 2 VIEW COMPARISON:  CT chest 12/05/2015.  PA and lateral chest 08/08/2016. FINDINGS: The lungs are clear. The patient is status post CABG. No pneumothorax or pleural effusion. No acute bony abnormality. IMPRESSION: No acute disease. Electronically Signed   By: Inge Rise M.D.   On: 01/13/2018 09:48    Procedures Procedures (including critical care time)  Medications Ordered in ED Medications  aspirin chewable tablet 324 mg (has no administration in time range)  ondansetron (ZOFRAN) injection 4 mg (has no administration in time range)  sodium chloride 0.9 % bolus 1,000 mL (has no administration in time range)     Initial Impression / Assessment and Plan / ED Course  I have reviewed the triage vital signs and the nursing notes.  Pertinent labs & imaging results that were available during my care of the patient were reviewed by me and considered in my medical decision making (see chart for details).  Clinical Course as of Jan 13 1630  Sun Jan 13, 2018  1103 Spoke with Dr. Danford Bad (IM resident, attending Dr. Dareen Piano) regarding admission.  Appreciate their  collaboration in the care of this patient.   [AM]  1121 Patient reassessed.  Heart rate is reduced.  Patient is feeling better at this time.  All test results provided to patient.   [AM]    Clinical Course User Index [AM] Albesa Seen, PA-C    Patient is nontoxic-appearing, but does appear uncomfortable.  Patient is slightly tachycardic, likely secondary to volume depletion.  Suspect viral gastroenteritis, however the patient's to be cardiovascular disease, he does not have much cardiovascular reserve.  Feel that patient will require hospitalization for chest pain rule out.  Full dose aspirin was provided.  Initiated fluid repletion.  Zofran for antiemetic. Will seek admission for CP rule out in setting of acute gastroenteritis illness.   Patient's EKG from today shows slight T wave flattening, otherwise no major changes suggestive of acute ischemia.  Initial troponin is negative.  Patient does have evidence of acute kidney injury.  See below for trend.  BUN is elevated suggestive of volume depletion.  Lab Results  Component Value Date   CREATININE 1.38 (H) 01/13/2018   CREATININE 1.01 10/16/2017   CREATININE 1.22 10/11/2017   This is a shared visit with Dr. Blanchie Dessert. Patient was independently evaluated by this attending physician. Attending physician consulted in evaluation and admission management.  Final Clinical Impressions(s) / ED Diagnoses   Final diagnoses:  Left-sided chest pain  Vomiting and diarrhea    ED Discharge Orders    None       Tamala Julian 01/13/18 1632    Blanchie Dessert, MD 01/14/18 2309

## 2018-01-13 NOTE — ED Notes (Signed)
Pt ambulated to the bathroom X1 assist with cane. Pt had steady gait and no complaints.

## 2018-01-13 NOTE — Progress Notes (Signed)
  Echocardiogram 2D Echocardiogram has been performed.  Madelaine Etienne 01/13/2018, 4:09 PM

## 2018-01-13 NOTE — ED Triage Notes (Signed)
Pt reports sudden onset of central to left sided chest heaviness that radiates to his back, states pain awoke him from sleep and was very severe. Pt reports sob, extensive cardiac hx.

## 2018-01-13 NOTE — Progress Notes (Addendum)
**Note Sadia Belfiore-Identified via Obfuscation** Patient alert and oriented x4. Denies chest pain/dizziness or shortness of breath States he feels tired. Patient's blood glucose level frequently low. Blood glucose level 68, apple juice and graham crackers given. Repeat blood glucose level 66; 25 ml of D50 given x1 and blood glucose level reached 110. On-call MD notified of findings, interventions, and concern of blood glucose level dropping overnight (see new orders). Report given to oncoming RN. Fall/safety precautions implemented.

## 2018-01-13 NOTE — H&P (Addendum)
Date: 01/13/2018               Patient Name:  Benjamin Harrison MRN: [id number]  DOB: Feb 05, 1935 Age / Sex: 83 y.o., male   PCP: Leonard Downing, MD         Medical Service: Internal Medicine Teaching Service         Attending Physician: Dr. Aldine Contes, MD    First Contact: Dr. Trilby Drummer Pager: [phone]  Second Contact: Dr. Danford Bad Pager: [phone]       After Hours (After 5p/  First Contact Pager: [phone]  weekends / holidays): Second Contact Pager: [phone]   Chief Complaint: Chest pain, Vomiting, Diarrhea  History of Present Illness: Mr Philbin is an 83 yo M with Hx of CAD (s/p CABG 2001, Stent), AAA, CKDII, DM, GERD, HTN, HLD, Hypothyroid who presented with chest pain, vomiting, and diarrhea. He has been experiencing 2 days of nausea, vomiting, and diarrhea. He describes diarrhea as loose and innumerable. He states his vomiting episodes have been innumerable as well. He  Has been unable to keep much of anything down for 2 days and has not been able to take his home medication yesterday or this morning. He states that at 1am while still awake; he began to experience an 8-10/10, stabbing/pressure-like substernal chest pain, that radiated to in between his shoulder blades and was acossiated with light headedness, SOB, and further nausea. He stated it felt like his previous heart attack and different from his typical agnina which is usually just some pressure. He denied any diaphoresis and states that he did not try to take any medications for this at home. The pain remained intense of and on for over 2 hours, but was improved when seen in the ED. He endorses headaches and chills. He denies melena, hematochezia, or fevers.  In the ED - Vitals: Stable in ED - Labs: CBC WNL, POC Troponin 0.00, BMP showed AKI with Cr 1.38 from baseline of 1.0-1.2 - EKG: Sinus rhythm, PACs, T-wave flattening in lateral leads - CXR: No acute disease - Interventions: 1.5L IVF, Zofran, ASA 324  Patient  to be admitted for further workup and care  Meds: Current Meds  Medication Sig  . acetaminophen (TYLENOL) 500 MG tablet Take 1,000 mg by mouth as needed for mild pain.  Marland Kitchen aspirin 81 MG tablet Take 1 tablet (81 mg total) by mouth daily.  Marland Kitchen atorvastatin (LIPITOR) 40 MG tablet TAKE 1 TABLET BY MOUTH  DAILY (Patient taking differently: TAKE 1 TABLET BY MOUTH  DAILY IN THE EVENING.)  . clopidogrel (PLAVIX) 75 MG tablet TAKE 1 TABLET BY MOUTH  DAILY  . fluocinonide (LIDEX) 0.05 % external solution Apply 1 mL topically 2 (two) times daily.  Marland Kitchen glimepiride (AMARYL) 4 MG tablet Take 4 mg by mouth 2 (two) times daily.   . hydrochlorothiazide (HYDRODIURIL) 25 MG tablet Take 1 tablet (25 mg total) by mouth daily.  . isosorbide mononitrate (IMDUR) 120 MG 24 hr tablet Take 1 tablet (120 mg total) by mouth daily.  Marland Kitchen levothyroxine (SYNTHROID, LEVOTHROID) 100 MCG tablet Take 100 mcg by mouth daily before breakfast.   . lisinopril (PRINIVIL,ZESTRIL) 10 MG tablet Take 1 tablet (10 mg total) by mouth daily.  . metoprolol tartrate (LOPRESSOR) 25 MG tablet TAKE 1 TABLET BY MOUTH TWO  TIMES DAILY  . Multiple Vitamins-Minerals (PRESERVISION AREDS PO) Take 1 tablet by mouth 2 (two) times daily.  . pantoprazole (PROTONIX) 40 MG tablet Take 1 tablet (40 mg  total) by mouth daily.  Vladimir Faster Glycol-Propyl Glycol (SYSTANE FREE OP) Place 1 drop into both eyes 3 (three) times daily as needed (for dry eyes.).   Marland Kitchen potassium chloride (KLOR-CON 10) 10 MEQ tablet TAKE 1 TABLET BY MOUTH EVERY DAY AT NIGHT     Allergies: Allergies as of 01/13/2018 - Review Complete 01/13/2018  Allergen Reaction Noted  . Codeine Nausea Only 10/31/2011   Past Medical History:  Diagnosis Date  . AAA (abdominal aortic aneurysm) ([place])   . CHF (congestive heart failure) ([place])   . Coronary artery disease    a. CABG 2001 with early failure of VG-RCA. b. Inf MI after Lexiscan 01/28/2009 s/p RCA stent. c. Stent to dRCA 02/24/09. d. DES to [place]   2012. e. NSTEMI s/p angioplasty for ISR of RCA 09/2011. f. 05/2012 Cath: LM 20-30p, LAD 100p, LCX min irregs, RCA patent prox stent, 52m ISR, VG->Diag nl w/ dzs in D1 prox to graft, VG->RCA 100, VG->OM nl, LIMA->LAD nl, Nl EF->Med Rx; g. 09/2014 low-risk MV.  Marland Kitchen Dyspnea   . GERD (gastroesophageal reflux disease)   . History of echocardiogram    a. 09/2014 Echo: EF 55-60%, mild basal-midinferior HK, triv AI.  Marland Kitchen Hyperlipidemia   . Hypertension   . Hypertensive heart disease   . Hypothyroidism   . Myocardial infarction ([place])   . Osteoarthritis    a.End-stage osteoarthritis, right knee, medial compartment  . Personal history of tobacco use   . Skin cancer   . Type II diabetes mellitus ([facility])     Family History:  Family History  Problem Relation Age of Onset  . Hypertension Mother   . Unexplained death Mother   . Throat cancer Sister   . Heart disease Brother   . Pneumonia Brother   . Heart disease Brother   . Prostate cancer Brother   . Unexplained death Brother        at birth  . Unexplained death Brother   - Reviewed on admission  Social History:  Social History   Tobacco Use  . Smoking status: Former Smoker    Last attempt to quit: 08/01/1975    Years since quitting: 42.4  . Smokeless tobacco: Never Used  Substance Use Topics  . Alcohol use: No  . Drug use: No  - Reviewed on admission  Review of Systems: A complete ROS was negative except as per HPI.  Physical Exam: Blood pressure (!) 140/97, pulse 95, temperature 99.7 F (37.6 C), temperature source Rectal, resp. rate 14, SpO2 98 %. Physical Exam  Constitutional: He is oriented to person, place, and time. He appears well-developed and well-nourished.  Mildly distressed  HENT:  Head: Normocephalic and atraumatic.  Eyes: EOM are normal. Right eye exhibits no discharge. Left eye exhibits no discharge.  Cardiovascular: Normal rate, regular rhythm and intact distal pulses.  Systolic murmur  Pulmonary/Chest: Effort  normal and breath sounds normal. No respiratory distress.  Abdominal: Soft. Bowel sounds are normal. There is no tenderness.  Mildly distended  Musculoskeletal: He exhibits no edema or deformity.  Neurological: He is alert and oriented to person, place, and time.  Skin: Skin is warm and dry.  Psychiatric: He has a normal mood and affect.     EKG: personally reviewed my interpretation is Sinus rhythm, PACs, T-wave flattening in lateral leads.  CXR: personally reviewed my interpretation is no acute diesease  Assessment & Plan by Problem: 83 yo M with Hx of CAD (s/p CABG 2001, Stent), AAA, CKDII,  DM, GERD, HTN, HLD, Hypothyroid who presented with chest pain, vomiting, and diarrhea.  Anginal Chest pain CAD: S/P CABG in 2001 and Stent. LHC on 10/15/17 showed severe in-stent restenosis of RCA. PCI of the RCA did not improve distal RCA but did improve proximal RCA stenosis. On Aspirin, Lisinopril, Metoprolol, atorvastatin, Imdur 120mg  Daily, and Ranexa 500mg  BID. Has chronic angina with symptoms of intermittent chest tightness with mild dyspnea, started on Ranexa in April 2019.   Presented follow episode of chest pain overnight that felt more line previous heart attack and less like chronic angina per patient in the setting of GI losses. POC Troponin in ED 0.00. Suspect worsening of patients angina in the setting of GI losses possibly combined with GERD.  - Cardiac Monitoring - ASA 81mg  Daily, IMDUR 120mg  Daily, Metoprolol 25mg  BID - Hold Lisinopril in the setting of GI losses - Trend troponin q6h - AM EKG - AM CBC and CMP  GERD Intractable Nausea and vomiting: Patient presenting with nausea, vomiting, and diarrhea for 2 days. He has had intermitted gastroenteritis symptoms about every couple of months for years. He nor family have noticed no associations with foods nor medicines. Outpatient workup has been negative for celiac disease and symptoms have not improved off Metformin. On protonix 40mg   Daily at home. > CT from Jan 2018 showed findings suggestive of small and large bowel enteritis - Clear liquid diet, advance as tolerated - Zofran q6h PRN - GI Cocktail TID PRN - Protonix 40mg  BID  AKI on CKDII: Patient presented with Cr 1.38 from baseline 1.0-1.2 in the setting of recent vomitng and diarrhea. - IV NS @ 150cc/hr - AM CMP  DM: Last A1c <7.  - CBGs - SSI-S  HTN: On hydrochlorothiazide, lisinopril, metoprolol and Imdur - Hold hydrochlorothiazide, lisinopril in the setting of volume loss from gastroenteritis - Continue home metoprolol and Imdur  HLD: Continue home atorvastin 40mg  Daily Hypothyroidism: Continue home Levothyroxine 131mcg Daily  FEN: 150cc/hr, Clear liquid advance as tolerated to HH/CM diet VTE ppx: Lovenox Code Status: FULL  Dispo: Admit patient to Observation with expected length of stay less than 2 midnights.  Signed: Neva Seat, MD 01/13/2018, 12:48 PM  Pager: [phone]

## 2018-01-13 NOTE — ED Notes (Signed)
Patient transported to X-ray 

## 2018-01-14 ENCOUNTER — Observation Stay (HOSPITAL_COMMUNITY): Payer: Medicare Other

## 2018-01-14 DIAGNOSIS — K219 Gastro-esophageal reflux disease without esophagitis: Secondary | ICD-10-CM

## 2018-01-14 DIAGNOSIS — R011 Cardiac murmur, unspecified: Secondary | ICD-10-CM

## 2018-01-14 DIAGNOSIS — Z7984 Long term (current) use of oral hypoglycemic drugs: Secondary | ICD-10-CM

## 2018-01-14 DIAGNOSIS — Z79899 Other long term (current) drug therapy: Secondary | ICD-10-CM

## 2018-01-14 DIAGNOSIS — N179 Acute kidney failure, unspecified: Secondary | ICD-10-CM

## 2018-01-14 DIAGNOSIS — I25119 Atherosclerotic heart disease of native coronary artery with unspecified angina pectoris: Secondary | ICD-10-CM | POA: Diagnosis not present

## 2018-01-14 DIAGNOSIS — I25118 Atherosclerotic heart disease of native coronary artery with other forms of angina pectoris: Secondary | ICD-10-CM | POA: Diagnosis not present

## 2018-01-14 DIAGNOSIS — Z79890 Hormone replacement therapy (postmenopausal): Secondary | ICD-10-CM

## 2018-01-14 DIAGNOSIS — Z951 Presence of aortocoronary bypass graft: Secondary | ICD-10-CM

## 2018-01-14 DIAGNOSIS — I252 Old myocardial infarction: Secondary | ICD-10-CM | POA: Diagnosis not present

## 2018-01-14 DIAGNOSIS — R112 Nausea with vomiting, unspecified: Secondary | ICD-10-CM | POA: Diagnosis not present

## 2018-01-14 DIAGNOSIS — E039 Hypothyroidism, unspecified: Secondary | ICD-10-CM | POA: Diagnosis not present

## 2018-01-14 DIAGNOSIS — N182 Chronic kidney disease, stage 2 (mild): Secondary | ICD-10-CM

## 2018-01-14 DIAGNOSIS — Z885 Allergy status to narcotic agent status: Secondary | ICD-10-CM

## 2018-01-14 DIAGNOSIS — I129 Hypertensive chronic kidney disease with stage 1 through stage 4 chronic kidney disease, or unspecified chronic kidney disease: Secondary | ICD-10-CM | POA: Diagnosis not present

## 2018-01-14 DIAGNOSIS — E1122 Type 2 diabetes mellitus with diabetic chronic kidney disease: Secondary | ICD-10-CM

## 2018-01-14 DIAGNOSIS — Z7982 Long term (current) use of aspirin: Secondary | ICD-10-CM

## 2018-01-14 DIAGNOSIS — E785 Hyperlipidemia, unspecified: Secondary | ICD-10-CM

## 2018-01-14 DIAGNOSIS — Z7902 Long term (current) use of antithrombotics/antiplatelets: Secondary | ICD-10-CM

## 2018-01-14 DIAGNOSIS — Z955 Presence of coronary angioplasty implant and graft: Secondary | ICD-10-CM

## 2018-01-14 DIAGNOSIS — R197 Diarrhea, unspecified: Secondary | ICD-10-CM | POA: Diagnosis not present

## 2018-01-14 DIAGNOSIS — I714 Abdominal aortic aneurysm, without rupture: Secondary | ICD-10-CM | POA: Diagnosis not present

## 2018-01-14 LAB — COMPREHENSIVE METABOLIC PANEL
ALBUMIN: 2.5 g/dL — AB (ref 3.5–5.0)
ALK PHOS: 47 U/L (ref 38–126)
ALT: 15 U/L — AB (ref 17–63)
AST: 15 U/L (ref 15–41)
Anion gap: 7 (ref 5–15)
BILIRUBIN TOTAL: 1.3 mg/dL — AB (ref 0.3–1.2)
BUN: 18 mg/dL (ref 6–20)
CO2: 23 mmol/L (ref 22–32)
Calcium: 7.5 mg/dL — ABNORMAL LOW (ref 8.9–10.3)
Chloride: 107 mmol/L (ref 101–111)
Creatinine, Ser: 1.15 mg/dL (ref 0.61–1.24)
GFR calc Af Amer: >60 mL/min (ref >60)
GFR, EST NON AFRICAN AMERICAN: 58 mL/min — AB (ref >60)
GLUCOSE: 120 mg/dL — AB (ref 65–99)
POTASSIUM: 3.8 mmol/L (ref 3.5–5.1)
Sodium: 137 mmol/L (ref 135–145)
TOTAL PROTEIN: 5.2 g/dL — AB (ref 6.5–8.1)

## 2018-01-14 LAB — CBC
HEMATOCRIT: 36.6 % — AB (ref 39.0–52.0)
Hemoglobin: 11.5 g/dL — ABNORMAL LOW (ref 13.0–17.0)
MCH: 24.5 pg — ABNORMAL LOW (ref 26.0–34.0)
MCHC: 31.4 g/dL (ref 30.0–36.0)
MCV: 77.9 fL — AB (ref 78.0–100.0)
PLATELETS: 144 10*3/uL — AB (ref 150–400)
RBC: 4.7 MIL/uL (ref 4.22–5.81)
RDW: 15.3 % (ref 11.5–15.5)
WBC: 5.2 10*3/uL (ref 4.0–10.5)

## 2018-01-14 LAB — GLUCOSE, CAPILLARY
GLUCOSE-CAPILLARY: 66 mg/dL (ref 65–99)
GLUCOSE-CAPILLARY: 132 mg/dL — AB (ref 65–99)
GLUCOSE-CAPILLARY: 146 mg/dL — AB (ref 65–99)
Glucose-Capillary: 101 mg/dL — ABNORMAL HIGH (ref 65–99)
Glucose-Capillary: 116 mg/dL — ABNORMAL HIGH (ref 65–99)

## 2018-01-14 LAB — ECHOCARDIOGRAM COMPLETE

## 2018-01-14 LAB — TROPONIN I

## 2018-01-14 MED ORDER — PANTOPRAZOLE SODIUM 40 MG PO TBEC: 40.0000 mg | DELAYED_RELEASE_TABLET | Freq: Two times a day (BID) | ORAL | 0 refills | Status: DC

## 2018-01-14 NOTE — Progress Notes (Signed)
  Date: 01/14/2018  Patient name: Benjamin Harrison  Medical record number: [id number]  Date of birth: [birth year]   I have seen and evaluated Benjamin Harrison and discussed their care with the Residency Team.  In brief, patient is an 83 year old male with past medical history of CAD status post CABG in 2001, AAA, CKD stage II, diabetes, GERD, hypertension, hyperlipidemia, hypothyroidism who presented to the ED with recurrent nausea and vomiting with associated chest pain.  Patient states that approximately 2 days prior to admission he noted intermittent nausea and vomiting with associated diarrhea.  Patient states that he had multiple episodes of vomiting and is unable to keep anything down over the last 2 to 3 days.  He states he is also not been able to take his home medications over the last 2 days.  He also complained of associated diarrhea which was watery and had multiple episodes of this.  He denies any melena or hematochezia.  No fevers, no palpitations, no lightheadedness, no syncope, no focal weakness, no headache, no blurry vision, no hematemesis.  Patient states that on the day of admission he developed sudden onset of substernal chest pain which was 10 out of 10 intensity, pressure-like and radiated to his shoulder blades.  Patient also complained of associated shortness of breath and states that the pain was similar to his previous heart attack.  Patient states the pain lasted approximately 2 hours but then resolved.  Today patient feels well with no new complaints.  No further nausea or vomiting and no diarrhea.  He is also not had any further episodes of chest pain.  PMHx, Fam Hx, and/or Soc Hx : As per resident admit note  Vitals:   01/14/18 0006 01/14/18 0725  BP: 113/70 116/76  Pulse:  88  Resp: 18 18  Temp: (!) 97.5 F (36.4 C) 97.9 F (36.6 C)  SpO2: 97% 98%   General: Awake, alert, oriented x3, NAD CVS: Regular rate and rhythm, systolic murmur noted, mild tenderness over  palpation of his chest wall Lungs: CTA bilaterally Abdomen: Soft, nontender, nondistended, normoactive bowel sounds Extremities: No edema noted  Assessment and Plan: I have seen and evaluated the patient as outlined above. I agree with the formulated Assessment and Plan as detailed in the residents' note, with the following changes:   1.  Chest pain: -Patient presented to the ED with substernal chest pain in the setting of multiple episodes of nausea and vomiting and history of coronary artery disease status post CABG.  Initial concern was for ACS.  EKG showed no acute ST or T wave changes and his troponins have been negative x3 set.  I suspect that his chest pain was likely secondary to a combination of musculoskeletal pain from straining his costochondral muscles, reflux from his nausea and vomiting as well as a component of angina from his chronic CAD. -Patient feels well today with no new complaints and his symptoms have currently resolved -We will continue with his aspirin, clopidogrel, Imdur and metoprolol as well as a high intensity statin -2D echo results noted.  Patient had a normal EF with no wall motion abnormalities -No further work-up at this time. -Patient is tolerating an oral diet with no further episodes of nausea vomiting or diarrhea and has not required Zofran.  If he has recurrent nausea vomiting he will need outpatient follow-up with his PCP for further work-up. -Patient is stable for discharge home today  Aldine Contes, MD 6/17/20191:09 PM

## 2018-01-14 NOTE — Progress Notes (Signed)
Patient discharged to home with wife, VSS, all belongings returned. Discharge instructions reviewed with patient and wife with teachback displayed. They have no questions or concerns at this time.

## 2018-01-14 NOTE — Discharge Summary (Signed)
Name: Benjamin Harrison MRN: [id number] DOB: 24-Jul-1935 83 y.o. PCP: Benjamin Downing, MD  Date of Admission: 01/13/2018  9:00 AM Date of Discharge: 01/14/2018 Attending Physician: Benjamin Contes, MD  Discharge Diagnosis:  1. Intractable Nausea and Vomiting 2. Angina 3. GERD 4. AAA  Discharge Medications: Allergies as of 01/14/2018      Reactions   Codeine Nausea Only   Pt reports this happens off and on      Medication List    TAKE these medications   acetaminophen 500 MG tablet Commonly known as:  TYLENOL Take 1,000 mg by mouth as needed for mild pain.   ALEVE 220 MG Caps Generic drug:  Naproxen Sodium Take 440 mg by mouth 2 (two) times daily as needed (for headache/pain.).   aspirin 81 MG tablet Take 1 tablet (81 mg total) by mouth daily.   atorvastatin 40 MG tablet Commonly known as:  LIPITOR TAKE 1 TABLET BY MOUTH  DAILY What changed:    how much to take  how to take this  when to take this   clopidogrel 75 MG tablet Commonly known as:  PLAVIX TAKE 1 TABLET BY MOUTH  DAILY   fluocinonide 0.05 % external solution Commonly known as:  LIDEX Apply 1 mL topically 2 (two) times daily.   glimepiride 4 MG tablet Commonly known as:  AMARYL Take 4 mg by mouth 2 (two) times daily.   hydrochlorothiazide 25 MG tablet Commonly known as:  HYDRODIURIL Take 1 tablet (25 mg total) by mouth daily.   isosorbide mononitrate 120 MG 24 hr tablet Commonly known as:  IMDUR Take 1 tablet (120 mg total) by mouth daily.   levothyroxine 100 MCG tablet Commonly known as:  SYNTHROID, LEVOTHROID Take 100 mcg by mouth daily before breakfast.   lisinopril 10 MG tablet Commonly known as:  PRINIVIL,ZESTRIL Take 1 tablet (10 mg total) by mouth daily.   metoprolol tartrate 25 MG tablet Commonly known as:  LOPRESSOR TAKE 1 TABLET BY MOUTH TWO  TIMES DAILY   pantoprazole 40 MG tablet Commonly known as:  PROTONIX Take 1 tablet (40 mg total) by mouth 2 (two) times  daily. What changed:  when to take this   potassium chloride 10 MEQ tablet Commonly known as:  KLOR-CON 10 TAKE 1 TABLET BY MOUTH EVERY DAY AT NIGHT   PRESERVISION AREDS PO Take 1 tablet by mouth 2 (two) times daily.   SYSTANE FREE OP Place 1 drop into both eyes 3 (three) times daily as needed (for dry eyes.).       Disposition and follow-up:   Mr.Benjamin Harrison was discharged from Benjamin Harrison in Stable condition.  At the hospital follow up visit please address:  GERD Intractable Nausea and vomiting - Evaluate for recurrence of symptoms - Continue outpatient work as appropriate - Protonix dose increased to 40mg  BID  Chest pain CAD:  - Troponin negative - Pain likely anginal (2/2 volume depletion) combined with GERD pain - Home Regimen resumed at discharge  AKI on CKDII: - BMET to ensure stability of renal function  Other:  AAA: Patient had US performed for AAA monitoring, which showed increase in AAA size from 3.4 to 4.3 in 18 months. (detailed below) - Referral placed to Vascular surgery on discharge - Ensure referral is being processed appropriately  HTN - Home HCTZ and Lisinopril were held due to normotension while off these medications in the setting of GI losses. These were resumed at discharge - Ensure BP  stable  Hyperbilirubinemia: - T.bili noted  To be chronically mildly elevated to 1.3 - No abnormality on abdominal ultrasound to explain this - Further workup outpatient as appropriate  2.  Labs / imaging needed at time of follow-up: BMET  3.  Pending labs/ test needing follow-up: None  Follow-up Appointments:   Hospital Course by problem list:  GERD Intractable Nausea and vomiting: Patient presenting with nausea, vomiting, and diarrhea for 2 days.He has had intermitted gastroenteritis symptoms aboutevery couple of months for years.He nor family have noticed any associations with foods or medicines.Outpatient workup has been  negative for celiac disease and symptoms have not improved off Metformin. On protonix 40mg  Daily at home. A CT from Jan 2018 showed findings suggestive of small and large bowel enteritis. GI symptoms resolved overnight with supportive care, Zofran, GI cocktail, and increasing his Protonix to 40mg  BID. Patient discharged on this increased dose of protonix as he appeared to have been having some breakthrough GERD pain.  Chest pain CAD: Patient with significant cardiac history and chronic angina presented following and episode of chest pain overnight that felt more like previous heart attack and less like chronic angina per patient. This was in the setting of significant GI losses from chronic recurrent enteritis with vomiting and diarrhea. His POC Troponin in ED was 0.00 and Troponin was negative x3 while inpatient. Suspected worsening of patients anginal symtpoms in the setting of GI losses possibly combined with GERD and esophageal irritation from repeated vomiting. Patient improved by the time of admission and received supportive treatment without recurrence of chest pain.  AKI on CKDII: Patient presented with Cr 1.38 )from baseline 1.0-1.2) in the setting of recent vomitng and diarrhea. Cr improved to 1.15 the following morning with IVF.  Discharge Vitals:   BP 116/76 (BP Location: Left Arm)   Pulse 88   Temp 97.9 F (36.6 C) (Oral)   Resp 18   Ht 5\' 5"  (5.993 m)   Wt 176 lb 5.9 oz (80 kg)   SpO2 98%   BMI 29.35 kg/m   Pertinent Labs, Studies, and Procedures:  CBC Latest Ref Rng & Units 01/14/2018 01/13/2018 10/16/2017  WBC 4.0 - 10.5 K/uL 5.2 7.4 5.6  Hemoglobin 13.0 - 17.0 g/dL 11.5(L) 15.4 12.3(L)  Hematocrit 39.0 - 52.0 % 36.6(L) 49.2 38.2(L)  Platelets 150 - 400 K/uL 144(L) 243 164   CMP Latest Ref Rng & Units 01/14/2018 01/13/2018 10/16/2017  Glucose 65 - 99 mg/dL 120(H) 228(H) 123(H)  BUN 6 - 20 mg/dL 18 26(H) 13  Creatinine 0.61 - 1.24 mg/dL 1.15 1.38(H) 1.01  Sodium 135 - 145  mmol/L 137 137 140  Potassium 3.5 - 5.1 mmol/L 3.8 4.4 4.1  Chloride 101 - 111 mmol/L 107 101 107  CO2 22 - 32 mmol/L 23 24 25   Calcium 8.9 - 10.3 mg/dL 7.5(L) 8.9 9.0  Total Protein 6.5 - 8.1 g/dL 5.2(L) - -  Total Bilirubin 0.3 - 1.2 mg/dL 1.3(H) - -  Alkaline Phos 38 - 126 U/L 47 - -  AST 15 - 41 U/L 15 - -  ALT 17 - 63 U/L 15(L) - -   Admission EKG:  EKG Interpretation  Date/Time:  Sunday January 13 2018 10:09:38 EDT Ventricular Rate:  98 PR Interval:  152 QRS Duration: 74 QT Interval:  375 QTC Calculation: 479 R Axis:   7 Text Interpretation:  Sinus tachycardia Atrial premature complexes Inferior infarct, old t-wave flattening lateral leads Confirmed by Blanchie Dessert [phone]) on 01/13/2018 10:18:23 AM  Echocardiogram: Study Conclusions - Left ventricle: The cavity size normal; Mild LVH; EF 60% to 65%;   Wall motion was normal - Aortic valve: Mild stenosis; Mild regurgitation - Mitral valve: There was mild regurgitation. - Right ventricle: The cavity size was moderately dilated. Wall   thickness was normal. - Right atrium: The atrium was moderately dilated. - Atrial septum: No defect or patent foramen ovale was identified.  US Abdomen Complete: IMPRESSION: - Aortic Atherosclerosis - Abdominal aortic aneurysm with transverse dimension of 3.8 x 4.3 cm (previously measuring 3.7 x 3.4cm on 08/08/2016 CT). This aneurysm extends over 3.8 cm in length. Given the change since prior examination, recommend vascular surgery referral/consultation if not already obtained. - Common iliac arteries appear dilated measuring up to 2.8 cm on the right and 2.5 cm on the left. - Cause for bilirubinemia not identified on the current exam. - Pancreas not visualized secondary to bowel gas. - 2.5 cm cyst right kidney incidentally noted.  Discharge Instructions: Discharge Instructions    Ambulatory referral to Vascular Surgery   Complete by:  As directed    Increasing AAA   Diet - low  sodium heart healthy   Complete by:  As directed    Discharge instructions   Complete by:  As directed    Thank you for allowing [place] to care for you  Your symptoms were likely due to a combination of Angina and GERD in the setting of dehydration from vomiting and diarrhea. - You had negative evaluation for heart attack - Your Protonix was increased to twice a day  Please follow up with your primary care provider in the next week.  Contact a medical professional if you experience a return of severe symptoms   Increase activity slowly   Complete by:  As directed       Signed: Neva Seat, MD 01/14/2018, 2:06 PM   Pager: [phone]

## 2018-01-14 NOTE — Progress Notes (Signed)
Subjective: Benjamin Harrison state he is feeling better this morning. He denies any further nausea, vomiting, diarrhea, or shortness of breath. His chest pain is greatly improved; he endorse some epigastric/central chest pain and chest wall pain. He states he feels back to normal and is ready to go home today if cleared. He was told we are getting an echo to evaluate his heart as it was unclear if his murmur was new or old initially and we are getting an ultrasound for interval evaluation of his AAA. He has no further questions or concerns this morning  Patient's relative expressed concern because she new someone who had melanoma who came down with GI symptoms ans was found to have metastasis after having the melanoma removed and she was concerned the patient could have something similar going on because he had a melanoma remove several months ago. Shew as informed that this was unlikely as patient had no other symptoms of malignancy and had his GI symptoms on going for some time prior to his melanoma being removed with a CT scan for the symptoms in early 2018 with no findings of malignancy. Patient has follow up scheduled with dermatology who removed melanoma.  Objective:  Vital signs in last 24 hours: Vitals:   01/13/18 2041 01/14/18 0006 01/14/18 0315 01/14/18 0725  BP: (!) 102/56 113/70  116/76  Pulse: 94   88  Resp: 17 18  18   Temp: 98.3 F (36.8 C) (!) 97.5 F (36.4 C)  97.9 F (36.6 C)  TempSrc: Oral Oral  Oral  SpO2: 96% 97%  98%  Weight:   176 lb 5.9 oz (80 kg)   Height:       Physical Exam  Constitutional: He is oriented to person, place, and time. He appears well-developed and well-nourished. No distress.  HENT:  Head: Normocephalic and atraumatic.  Eyes: EOM are normal. Right eye exhibits no discharge. Left eye exhibits no discharge.  Cardiovascular: Normal rate, regular rhythm and intact distal pulses.  Murmur heard. Pulmonary/Chest: Effort normal and breath sounds normal. No  respiratory distress.  Mild chest wall tenderness  Abdominal: Soft. Bowel sounds are normal. He exhibits no distension. There is no tenderness.  Musculoskeletal: He exhibits no edema or deformity.  Neurological: He is alert and oriented to person, place, and time.  Skin: Skin is warm and dry.  Psychiatric: He has a normal mood and affect. His behavior is normal.   Assessment/Plan: 83 yo M with Hx of CAD (s/p CABG 2001, Stent), AAA, CKDII, DM, GERD, HTN, HLD, Hypothyroid who presented with chest pain, vomiting, and diarrhea  Chest pain CAD: S/P CABG in 2001 and Stent. LHC on 10/15/17 showed severe in-stent restenosis of RCA.PCI of the RCA did not improve distal RCA but did improve proximal RCA stenosis. On Aspirin, Lisinopril,Metoprolol, atorvastatin,Imdur 120mg  Daily, and Ranexa 500mg  BID. Has chronic angina with symptoms of intermittent chest tightness with mild dyspnea, started on Ranexa in April 2019.              Presented following and episode of chest pain overnight that felt more line previous heart attack and less like chronic angina per patient in the setting of GI losses. POC Troponin in ED 0.00. Suspected worsening of patients angina in the setting of GI losses possibly combined with GERD.  > Troponin negative x3 > AM EKG non ischemic > Dilutional reduction in Hgb, PLT, and WBC on AM labs - Cardiac Monitoring - ASA 81mg  Daily, IMDUR 120mg  Daily, Metoprolol 25mg   BID - Hold Lisinopril as patient remains normotensive, resume on discharge  GERD Intractable Nausea and vomiting: Patient presenting with nausea, vomiting, and diarrhea for 2 days. He has had intermitted gastroenteritis symptoms about every couple of months for years. He nor family have noticed no associations with foods nor medicines. Outpatient workup has been negative for celiac disease and symptoms have not improved off Metformin. On protonix 40mg  Daily at home. > CT from Jan 2018 showed findings suggestive of small and  large bowel enteritis > Patient GI symptoms have resolved this morning, able to tolerate some breakfast, no further vomiting nor diarrhea > Patient continue to endorse pain consistent with lower chest/epigastrium poorly controlled GERD may be contributing exacerbated by vomiting. - CM/HH Diet - Zofran q6h PRN - GI Cocktail TID PRN - Protonix 40mg  BID  AKI on CKDII: Patient presented with Cr 1.38 from baseline 1.0-1.2 in the setting of recent vomitng and diarrhea. > Cr improved to 1.15 on Am labs - D/C IVF  DM: Last A1c <7.  > Patient did have hypoglycemic episodes follow 2U of SSI in the setting of decreased oral intake in a patient who does not take insulin at home. Was place on D5 and SSI was discontinued. Glucose improved this AM, patient eating now - CBGs  HTN: On hydrochlorothiazide, lisinopril, metoprolol and Imdur - Will hold hydrochlorothiazide, lisinopril as patient remains normotensive; will resume on discharge as patient is tolerating PO now - Continue home metoprolol and Imdur  HLD: Continue home atorvastin 40mg  Daily Hypothyroidism: Continue home Levothyroxine 170mcg Daily  FEN: HH/CM diet VTE ppx: Lovenox Code Status: FULL  Dispo: Anticipated discharge later today.   Benjamin Seat, MD 01/14/2018, 10:31 AM Pager: [phone]

## 2018-01-16 ENCOUNTER — Encounter: Payer: Self-pay | Admitting: Vascular Surgery

## 2018-01-16 ENCOUNTER — Ambulatory Visit: Payer: Medicare Other | Admitting: Vascular Surgery

## 2018-01-16 ENCOUNTER — Other Ambulatory Visit: Payer: Self-pay

## 2018-01-16 VITALS — BP 153/87 | HR 119 | Temp 97.9°F | Resp 16 | Ht 65.0 in | Wt 169.0 lb

## 2018-01-16 DIAGNOSIS — I714 Abdominal aortic aneurysm, without rupture, unspecified: Secondary | ICD-10-CM

## 2018-01-16 NOTE — Progress Notes (Signed)
Patient name: Benjamin Harrison MRN: [id number] DOB: 10/14/1934 Sex: male   REASON FOR CONSULT:    Abdominal aortic aneurysm.  The consult is requested by Dr. Arelia Sneddon.  HPI:   Benjamin Harrison is a pleasant 83 y.o. male, within history of an abdominal aortic aneurysm.  He had recently presented with some nausea and vomiting and part of his work-up included an ultrasound of the abdomen showed that his aneurysm had enlarged some.  He sent for vascular consultation.  He denies any family history of aneurysmal disease.  He denies any significant abdominal pain or back pain.  His nausea has resolved.  He is not clear what the etiology of this was.  He is not a smoker.  He does have some underlying hypertension which is been under reasonable control.  Past Medical History:  Diagnosis Date  . AAA (abdominal aortic aneurysm) ([place])   . CHF (congestive heart failure) ([place])   . Coronary artery disease    a. CABG 2001 with early failure of VG-RCA. b. Inf MI after Lexiscan 01/28/2009 s/p RCA stent. c. Stent to dRCA 02/24/09. d. DES to [place]  2012. e. NSTEMI s/p angioplasty for ISR of RCA 09/2011. f. 05/2012 Cath: LM 20-30p, LAD 100p, LCX min irregs, RCA patent prox stent, 79m ISR, VG->Diag nl w/ dzs in D1 prox to graft, VG->RCA 100, VG->OM nl, LIMA->LAD nl, Nl EF->Med Rx; g. 09/2014 low-risk MV.  Marland Kitchen Dyspnea   . GERD (gastroesophageal reflux disease)   . History of echocardiogram    a. 09/2014 Echo: EF 55-60%, mild basal-midinferior HK, triv AI.  Marland Kitchen Hyperlipidemia   . Hypertension   . Hypertensive heart disease   . Hypothyroidism   . Myocardial infarction ([place])   . Osteoarthritis    a.End-stage osteoarthritis, right knee, medial compartment  . Personal history of tobacco use   . Skin cancer   . Type II diabetes mellitus (H[facility])     Family History  Problem Relation Age of Onset  . Hypertension Mother   . Unexplained death Mother   . Throat cancer Sister   . Heart disease Brother   .  Pneumonia Brother   . Heart disease Brother   . Prostate cancer Brother   . Unexplained death Brother        at birth  . Unexplained death Brother     SOCIAL HISTORY: Social History   Socioeconomic History  . Marital status: Married    Spouse name: Not on file  . Number of children: Not on file  . Years of education: Not on file  . Highest education level: Not on file  Occupational History  . Not on file  Social Needs  . Financial resource strain: Not on file  . Food insecurity:    Worry: Not on file    Inability: Not on file  . Transportation needs:    Medical: Not on file    Non-medical: Not on file  Tobacco Use  . Smoking status: Former Smoker    Last attempt to quit: 08/01/1975    Years since quitting: 42.4  . Smokeless tobacco: Never Used  Substance and Sexual Activity  . Alcohol use: No  . Drug use: No  . Sexual activity: Never  Lifestyle  . Physical activity:    Days per week: Not on file    Minutes per session: Not on file  . Stress: Not on file  Relationships  . Social connections:    Talks on  phone: Not on file    Gets together: Not on file    Attends religious service: Not on file    Active member of club or organization: Not on file    Attends meetings of clubs or organizations: Not on file    Relationship status: Not on file  . Intimate partner violence:    Fear of current or ex partner: Not on file    Emotionally abused: Not on file    Physically abused: Not on file    Forced sexual activity: Not on file  Other Topics Concern  . Not on file  Social History Narrative   Lives locally with wife.    Allergies  Allergen Reactions  . Codeine Nausea Only    Pt reports this happens off and on    Current Outpatient Medications  Medication Sig Dispense Refill  . acetaminophen (TYLENOL) 500 MG tablet Take 1,000 mg by mouth as needed for mild pain.    Marland Kitchen aspirin 81 MG tablet Take 1 tablet (81 mg total) by mouth daily.    Marland Kitchen atorvastatin (LIPITOR) 40  MG tablet TAKE 1 TABLET BY MOUTH  DAILY (Patient taking differently: TAKE 1 TABLET BY MOUTH  DAILY IN THE EVENING.) 90 tablet 3  . clopidogrel (PLAVIX) 75 MG tablet TAKE 1 TABLET BY MOUTH  DAILY 90 tablet 3  . fluocinonide (LIDEX) 0.05 % external solution Apply 1 mL topically 2 (two) times daily.  3  . glimepiride (AMARYL) 4 MG tablet Take 4 mg by mouth 2 (two) times daily.     . hydrochlorothiazide (HYDRODIURIL) 25 MG tablet Take 1 tablet (25 mg total) by mouth daily. 90 tablet 3  . isosorbide mononitrate (IMDUR) 120 MG 24 hr tablet Take 1 tablet (120 mg total) by mouth daily. 90 tablet 3  . levothyroxine (SYNTHROID, LEVOTHROID) 100 MCG tablet Take 100 mcg by mouth daily before breakfast.     . lisinopril (PRINIVIL,ZESTRIL) 10 MG tablet Take 1 tablet (10 mg total) by mouth daily. 90 tablet 2  . metoprolol tartrate (LOPRESSOR) 25 MG tablet TAKE 1 TABLET BY MOUTH TWO  TIMES DAILY 180 tablet 3  . pantoprazole (PROTONIX) 40 MG tablet Take 1 tablet (40 mg total) by mouth 2 (two) times daily. 60 tablet 0  . Polyethyl Glycol-Propyl Glycol (SYSTANE FREE OP) Place 1 drop into both eyes 3 (three) times daily as needed (for dry eyes.).     Marland Kitchen potassium chloride (KLOR-CON 10) 10 MEQ tablet TAKE 1 TABLET BY MOUTH EVERY DAY AT NIGHT 90 tablet 3   No current facility-administered medications for this visit.     REVIEW OF SYSTEMS:  [X]  denotes positive finding, [ ]  denotes negative finding Cardiac  Comments:  Chest pain or chest pressure: x   Shortness of breath upon exertion: x   Short of breath when lying flat: x   Irregular heart rhythm:        Vascular    Pain in calf, thigh, or hip brought on by ambulation: x   Pain in feet at night that wakes you up from your sleep:     Blood clot in your veins:    Leg swelling:         Pulmonary    Oxygen at home:    Productive cough:     Wheezing:         Neurologic    Sudden weakness in arms or legs:     Sudden numbness in arms or legs:  Sudden  onset of difficulty speaking or slurred speech:    Temporary loss of vision in one eye:     Problems with dizziness:         Gastrointestinal    Blood in stool:     Vomited blood:         Genitourinary    Burning when urinating:     Blood in urine:        Psychiatric    Major depression:         Hematologic    Bleeding problems:    Problems with blood clotting too easily:        Skin    Rashes or ulcers:        Constitutional    Fever or chills:     PHYSICAL EXAM:   Vitals:   01/16/18 0926  BP: (!) 153/87  Pulse: (!) 119  Resp: 16  Temp: 97.9 F (36.6 C)  TempSrc: Oral  SpO2: 96%  Weight: 169 lb (76.7 kg)  Height: 5\' 5"  (1.651 m)    GENERAL: The patient is a well-nourished male, in no acute distress. The vital signs are documented above. CARDIAC: There is a regular rate and rhythm.  VASCULAR: I do not detect carotid bruits. He has palpable femoral, popliteal, and pedal pulses bilaterally. He has no significant lower extremity swelling. PULMONARY: There is good air exchange bilaterally without wheezing or rales. ABDOMEN: Soft and non-tender with normal pitched bowel sounds.  He has a ventral hernia.  Because of his size I cannot palpate his aneurysm. MUSCULOSKELETAL: There are no major deformities or cyanosis. NEUROLOGIC: No focal weakness or paresthesias are detected. SKIN: There are no ulcers or rashes noted. PSYCHIATRIC: The patient has a normal affect.  DATA:    ULTRASOUND: I reviewed his ultrasound was done on 01/14/2018.  This shows that the maximum diameter of his aneurysm is 4.3 cm.  The right common iliac artery measures 2.8 cm in maximum diameter.  The left common iliac artery measures 2.5 cm in maximum diameter.  CT ABDOMEN: I reviewed his CT the abdomen that was done on 08/08/2016.  Shows that the maximum diameter of his aneurysm at that time was 3.5 cm.  Based on my review the films he appears to potentially be a candidate for endovascular repair of  the aneurysm if this enlarged significantly.  MEDICAL ISSUES:   ASYMPTOMATIC 4.3 CM INFRARENAL ABDOMINAL AORTIC ANEURYSM: This patient's aneurysm has enlarged less than a centimeter in a year and a half.  I have explained that we would normally consider elective repair in a normal risk patient of an aneurysm if it reached 5.5 cm in maximum diameter.  I have ordered a follow-up ultrasound in 6 months and I will see him back at that time.  Fortunately he is not a smoker.  His blood pressure is slightly elevated and I have explained to him that poorly controlled blood pressure does not seem to increase his risk of aneurysm expansion.  We will continue to follow this closely and only consider elective repair if it enlarges significantly.  Based on his CT scan he would potentially be a candidate for endovascular repair if he enlarges.  However currently it is fairly small.  His iliac arteries also somewhat ectatic.  I will see him back in 6 months.  He knows to call sooner if he has problems.  Deitra Mayo [facility] [phone]

## 2018-01-18 ENCOUNTER — Other Ambulatory Visit: Payer: Self-pay

## 2018-01-18 DIAGNOSIS — I714 Abdominal aortic aneurysm, without rupture, unspecified: Secondary | ICD-10-CM

## 2018-02-05 ENCOUNTER — Other Ambulatory Visit: Payer: Self-pay | Admitting: Cardiovascular Disease

## 2018-02-07 ENCOUNTER — Emergency Department (HOSPITAL_COMMUNITY)
Admission: EM | Admit: 2018-02-07 | Discharge: 2018-02-07 | Disposition: A | Discharge status: Home / Self Care | Payer: Medicare Other | Attending: Emergency Medicine | Admitting: Emergency Medicine

## 2018-02-07 ENCOUNTER — Encounter (HOSPITAL_COMMUNITY): Payer: Self-pay | Admitting: Emergency Medicine

## 2018-02-07 ENCOUNTER — Emergency Department (HOSPITAL_COMMUNITY): Payer: Medicare Other

## 2018-02-07 ENCOUNTER — Other Ambulatory Visit: Payer: Self-pay

## 2018-02-07 DIAGNOSIS — Z7902 Long term (current) use of antithrombotics/antiplatelets: Secondary | ICD-10-CM | POA: Insufficient documentation

## 2018-02-07 DIAGNOSIS — I110 Hypertensive heart disease with heart failure: Secondary | ICD-10-CM | POA: Diagnosis not present

## 2018-02-07 DIAGNOSIS — Z87891 Personal history of nicotine dependence: Secondary | ICD-10-CM | POA: Diagnosis not present

## 2018-02-07 DIAGNOSIS — I2510 Atherosclerotic heart disease of native coronary artery without angina pectoris: Secondary | ICD-10-CM | POA: Diagnosis not present

## 2018-02-07 DIAGNOSIS — I509 Heart failure, unspecified: Secondary | ICD-10-CM | POA: Diagnosis not present

## 2018-02-07 DIAGNOSIS — E119 Type 2 diabetes mellitus without complications: Secondary | ICD-10-CM | POA: Diagnosis not present

## 2018-02-07 DIAGNOSIS — R1030 Lower abdominal pain, unspecified: Secondary | ICD-10-CM | POA: Diagnosis present

## 2018-02-07 DIAGNOSIS — N3289 Other specified disorders of bladder: Secondary | ICD-10-CM | POA: Diagnosis not present

## 2018-02-07 DIAGNOSIS — Z79899 Other long term (current) drug therapy: Secondary | ICD-10-CM | POA: Diagnosis not present

## 2018-02-07 DIAGNOSIS — E785 Hyperlipidemia, unspecified: Secondary | ICD-10-CM | POA: Diagnosis not present

## 2018-02-07 DIAGNOSIS — E860 Dehydration: Secondary | ICD-10-CM | POA: Insufficient documentation

## 2018-02-07 DIAGNOSIS — Z7982 Long term (current) use of aspirin: Secondary | ICD-10-CM | POA: Diagnosis not present

## 2018-02-07 DIAGNOSIS — I252 Old myocardial infarction: Secondary | ICD-10-CM | POA: Diagnosis not present

## 2018-02-07 DIAGNOSIS — E039 Hypothyroidism, unspecified: Secondary | ICD-10-CM | POA: Diagnosis not present

## 2018-02-07 DIAGNOSIS — R112 Nausea with vomiting, unspecified: Secondary | ICD-10-CM | POA: Insufficient documentation

## 2018-02-07 LAB — URINALYSIS, ROUTINE W REFLEX MICROSCOPIC
Bilirubin Urine: NEGATIVE
GLUCOSE, UA: NEGATIVE mg/dL
Hgb urine dipstick: NEGATIVE
KETONES UR: NEGATIVE mg/dL
Leukocytes, UA: NEGATIVE
Nitrite: NEGATIVE
PH: 5 (ref 5.0–8.0)
Protein, ur: NEGATIVE mg/dL
SPECIFIC GRAVITY, URINE: 1.017 (ref 1.005–1.030)

## 2018-02-07 LAB — BASIC METABOLIC PANEL
Anion gap: 11 (ref 5–15)
BUN: 18 mg/dL (ref 8–23)
CALCIUM: 9.1 mg/dL (ref 8.9–10.3)
CHLORIDE: 101 mmol/L (ref 98–111)
CO2: 26 mmol/L (ref 22–32)
Creatinine, Ser: 1.37 mg/dL — ABNORMAL HIGH (ref 0.61–1.24)
GFR calc Af Amer: 54 mL/min — ABNORMAL LOW (ref >60)
GFR calc non Af Amer: 47 mL/min — ABNORMAL LOW (ref >60)
GLUCOSE: 158 mg/dL — AB (ref 70–99)
POTASSIUM: 4.6 mmol/L (ref 3.5–5.1)
Sodium: 138 mmol/L (ref 135–145)

## 2018-02-07 LAB — CBC
HEMATOCRIT: 39.5 % (ref 39.0–52.0)
HEMOGLOBIN: 12.6 g/dL — AB (ref 13.0–17.0)
MCH: 24.3 pg — AB (ref 26.0–34.0)
MCHC: 31.9 g/dL (ref 30.0–36.0)
MCV: 76.1 fL — AB (ref 78.0–100.0)
Platelets: 231 10*3/uL (ref 150–400)
RBC: 5.19 MIL/uL (ref 4.22–5.81)
RDW: 15.7 % — AB (ref 11.5–15.5)
WBC: 9.5 10*3/uL (ref 4.0–10.5)

## 2018-02-07 MED ORDER — ONDANSETRON 4 MG PO TBDP: 4.0000 mg | ORAL_TABLET | Freq: Three times a day (TID) | ORAL | 0 refills | Status: DC | PRN

## 2018-02-07 MED ORDER — PHENAZOPYRIDINE HCL 200 MG PO TABS: 200.0000 mg | ORAL_TABLET | Freq: Three times a day (TID) | ORAL | 0 refills | Status: DC

## 2018-02-07 MED ORDER — PHENAZOPYRIDINE HCL 100 MG PO TABS
200.0000 mg | ORAL_TABLET | Freq: Once | ORAL | Status: AC
  Administered 2018-02-07: 200 mg via ORAL
  Filled 2018-02-07: qty 2

## 2018-02-07 MED ORDER — MORPHINE SULFATE (PF) 4 MG/ML IV SOLN
4.0000 mg | Freq: Once | INTRAVENOUS | Status: AC
  Administered 2018-02-07: 4 mg via INTRAVENOUS
  Filled 2018-02-07: qty 1

## 2018-02-07 MED ORDER — ONDANSETRON HCL 4 MG/2ML IJ SOLN
4.0000 mg | Freq: Once | INTRAMUSCULAR | Status: AC
  Administered 2018-02-07: 4 mg via INTRAVENOUS
  Filled 2018-02-07: qty 2

## 2018-02-07 MED ORDER — SODIUM CHLORIDE 0.9 % IV BOLUS
1000.0000 mL | Freq: Once | INTRAVENOUS | Status: AC
  Administered 2018-02-07: 1000 mL via INTRAVENOUS

## 2018-02-07 NOTE — ED Notes (Signed)
Pt given saltine crackers and iced water at 22:10 for PO challenge. Pt tolerated well.

## 2018-02-07 NOTE — ED Triage Notes (Signed)
Pt sent by doctor due to concern for UTI and the possibility of kidney failure. Reports UTI diagnosed on Monday and is currently on antibiotics.

## 2018-02-07 NOTE — ED Provider Notes (Signed)
[facility] EMERGENCY DEPARTMENT Provider Note   CSN: [id number] Arrival date & time: 02/07/18  1818     History   Chief Complaint Chief Complaint  Patient presents with  . Urinary Tract Infection    HPI Benjamin Harrison is a 83 y.o. male.  Pt presents to the ED today with n/v and suprapubic abdominal tenderness.  He was treated earlier in the week for an UTI with bactrim by his pcp.  He was admitted from 6/16-17 for n/v and felt like he recovered from that, but he's been sick this week.  His pcp sent him to be evaluated b/c he was getting worse.  The pt denies any fevers.      Past Medical History:  Diagnosis Date  . AAA (abdominal aortic aneurysm) ([place])   . CHF (congestive heart failure) ([place])   . Coronary artery disease    a. CABG 2001 with early failure of VG-RCA. b. Inf MI after Lexiscan 01/28/2009 s/p RCA stent. c. Stent to dRCA 02/24/09. d. DES to [place]  2012. e. NSTEMI s/p angioplasty for ISR of RCA 09/2011. f. 05/2012 Cath: LM 20-30p, LAD 100p, LCX min irregs, RCA patent prox stent, 52m ISR, VG->Diag nl w/ dzs in D1 prox to graft, VG->RCA 100, VG->OM nl, LIMA->LAD nl, Nl EF->Med Rx; g. 09/2014 low-risk MV.  Marland Kitchen Dyspnea   . GERD (gastroesophageal reflux disease)   . History of echocardiogram    a. 09/2014 Echo: EF 55-60%, mild basal-midinferior HK, triv AI.  Marland Kitchen Hyperlipidemia   . Hypertension   . Hypertensive heart disease   . Hypothyroidism   . Myocardial infarction ([place])   . Osteoarthritis    a.End-stage osteoarthritis, right knee, medial compartment  . Personal history of tobacco use   . Skin cancer   . Type II diabetes mellitus [facility])     Patient Active Problem List   Diagnosis Date Noted  . Intractable nausea and vomiting 01/13/2018  . S/P PTCA (percutaneous transluminal coronary angioplasty) 11/04/2017  . Angina pectoris ([place]) 10/15/2017  . Abdominal aortic aneurysm (AAA) without rupture ([place]) 08/10/2016  . Acute on chronic kidney  failure-II 08/09/2016  . GERD (gastroesophageal reflux disease) 08/08/2016  . Gastroenteritis 08/08/2016  . Sepsis ([place]) 08/08/2016  . Cough 08/08/2016  . Hypertensive heart disease   . Type II diabetes mellitus ([place])   . Thrombocytopenia ([place]) 06/08/2012  . NSTEMI (non-ST elevated myocardial infarction) ([place]) 10/09/2011  . Unstable angina ([place]) 10/08/2011  . Colitis 07/06/2011  . Diabetes mellitus ([place]) 07/05/2011  . Diarrhea 07/05/2011  . Weakness generalized 07/05/2011  . Fatigue 11/11/2010  . Coronary artery disease   . Hypertension   . Hypothyroidism   . Hyperlipidemia   . SOB (shortness of breath) on exertion   . Personal history of tobacco use     Past Surgical History:  Procedure Laterality Date  . CARDIAC CATHETERIZATION    . CARDIAC CATHETERIZATION N/A 12/06/2015   Procedure: Left Heart Cath and Cors/Grafts Angiography;  Surgeon: Burnell Blanks, MD;  Location: [facility] CV LAB;  Service: Cardiovascular;  Laterality: N/A;  . CARDIAC CATHETERIZATION N/A 12/06/2015   Procedure: Coronary Balloon Angioplasty;  Surgeon: Burnell Blanks, MD;  Location: [facility] CV LAB;  Service: Cardiovascular;  Laterality: N/A;  . CORONARY ARTERY BYPASS GRAFT    . HERNIA REPAIR  1985  . INTRAVASCULAR ULTRASOUND/IVUS N/A 10/15/2017   Procedure: Intravascular Ultrasound/IVUS;  Surgeon: Belva Crome, MD;  Location: [name] CV LAB;  Service: Cardiovascular;  Laterality: N/A;  . KNEE ARTHROSCOPY  1999 and 2004  . LEFT HEART CATH AND CORS/GRAFTS ANGIOGRAPHY N/A 10/15/2017   Procedure: LEFT HEART CATH AND CORS/GRAFTS ANGIOGRAPHY;  Surgeon: Belva Crome, MD;  Location: [facility] CV LAB;  Service: Cardiovascular;  Laterality: N/A;  . LEFT HEART CATHETERIZATION WITH CORONARY ANGIOGRAM N/A 10/09/2011   Procedure: LEFT HEART CATHETERIZATION WITH CORONARY ANGIOGRAM;  Surgeon: Peter M Martinique, MD;  Location: [facility] CATH LAB;  Service: Cardiovascular;  Laterality: N/A;  . LEFT HEART  CATHETERIZATION WITH CORONARY/GRAFT ANGIOGRAM N/A 06/06/2012   Procedure: LEFT HEART CATHETERIZATION WITH Beatrix Fetters;  Surgeon: Peter M Martinique, MD;  Location: [facility] CATH LAB;  Service: Cardiovascular;  Laterality: N/A;        Home Medications    Prior to Admission medications   Medication Sig Start Date End Date Taking? Authorizing Provider  acetaminophen (TYLENOL) 500 MG tablet Take 1,000 mg by mouth as needed for mild pain.    [provider]  aspirin 81 MG tablet Take 1 tablet (81 mg total) by mouth daily. 10/10/11   [name], NP  atorvastatin (LIPITOR) 40 MG tablet TAKE 1 TABLET BY MOUTH  DAILY Patient taking differently: TAKE 1 TABLET BY MOUTH  DAILY IN THE EVENING. 05/29/17   [name], [name], MD  clopidogrel (PLAVIX) 75 MG tablet TAKE 1 TABLET BY MOUTH  DAILY 05/29/17   [name], [name], MD  fluocinonide (LIDEX) 0.05 % external solution Apply 1 mL topically 2 (two) times daily. 12/24/17   [provider]  glimepiride (AMARYL) 4 MG tablet Take 4 mg by mouth 2 (two) times daily.     [provider]  hydrochlorothiazide (HYDRODIURIL) 25 MG tablet Take 1 tablet (25 mg total) by mouth daily. 12/19/13   [name], [name], MD  isosorbide mononitrate (IMDUR) 120 MG 24 hr tablet Take 1 tablet (120 mg total) by mouth daily. 10/16/17   [name], [name], PA-C  KLOR-CON 10 10 MEQ tablet TAKE 1 TABLET BY MOUTH EVERY DAY 02/06/18   [name], [name], MD  levothyroxine (SYNTHROID, LEVOTHROID) 100 MCG tablet Take 100 mcg by mouth daily before breakfast.     [provider]  lisinopril (PRINIVIL,ZESTRIL) 10 MG tablet Take 1 tablet (10 mg total) by mouth daily. 10/11/17 07/08/18  [name], PA  metoprolol tartrate (LOPRESSOR) 25 MG tablet TAKE 1 TABLET BY MOUTH TWO  TIMES DAILY 05/29/17   [name], [name], MD  ondansetron (ZOFRAN ODT) 4 MG disintegrating tablet Take 1 tablet (4 mg total) by mouth every 8 (eight) hours as needed. 02/07/18    [name], MD  pantoprazole (PROTONIX) 40 MG tablet Take 1 tablet (40 mg total) by mouth 2 (two) times daily. 01/14/18   Neva Seat, MD  phenazopyridine (PYRIDIUM) 200 MG tablet Take 1 tablet (200 mg total) by mouth 3 (three) times daily. 02/07/18   [name], MD  Polyethyl Glycol-Propyl Glycol (SYSTANE FREE OP) Place 1 drop into both eyes 3 (three) times daily as needed (for dry eyes.).     [provider]    Family History Family History  Problem Relation Age of Onset  . Hypertension Mother   . Unexplained death Mother   . Throat cancer Sister   . Heart disease Brother   . Pneumonia Brother   . Heart disease Brother   . Prostate cancer Brother   . Unexplained death Brother        at birth  . Unexplained death Brother  Social History Social History   Tobacco Use  . Smoking status: Former Smoker    Last attempt to quit: 08/01/1975    Years since quitting: 42.5  . Smokeless tobacco: Never Used  Substance Use Topics  . Alcohol use: No  . Drug use: No     Allergies   Codeine   Review of Systems Review of Systems  Gastrointestinal: Positive for abdominal pain, nausea and vomiting.  All other systems reviewed and are negative.    Physical Exam Updated Vital Signs BP 132/77   Pulse 66   Temp 98 F (36.7 C) (Oral)   Resp 17   Ht 5\' 5"  (1.651 m)   Wt 76.7 kg (169 lb)   SpO2 94%   BMI 28.12 kg/m   Physical Exam  Constitutional: He is oriented to person, place, and time. He appears well-developed and well-nourished.  HENT:  Head: Normocephalic and atraumatic.  Right Ear: External ear normal.  Left Ear: External ear normal.  Nose: Nose normal.  Eyes: Pupils are equal, round, and reactive to light. Conjunctivae and EOM are normal.  Neck: Normal range of motion. Neck supple.  Cardiovascular: Normal rate, regular rhythm, normal heart sounds and intact distal pulses.  Pulmonary/Chest: Effort normal and breath sounds normal.  Abdominal:  Soft. Bowel sounds are normal. There is tenderness in the suprapubic area.  Musculoskeletal: Normal range of motion.  Neurological: He is alert and oriented to person, place, and time.  Skin: Skin is warm. Capillary refill takes less than 2 seconds.  Psychiatric: He has a normal mood and affect. His behavior is normal. Judgment and thought content normal.  Nursing note and vitals reviewed.    ED Treatments / Results  Labs (all labs ordered are listed, but only abnormal results are displayed) Labs Reviewed  BASIC METABOLIC PANEL - Abnormal; Notable for the following components:      Result Value   Glucose, Bld 158 (*)    Creatinine, Ser 1.37 (*)    GFR calc non Af Amer 47 (*)    GFR calc Af Amer 54 (*)    All other components within normal limits  CBC - Abnormal; Notable for the following components:   Hemoglobin 12.6 (*)    MCV 76.1 (*)    MCH 24.3 (*)    RDW 15.7 (*)    All other components within normal limits  URINE CULTURE  URINALYSIS, ROUTINE W REFLEX MICROSCOPIC    EKG None  Radiology Ct Renal Stone Study  Result Date: 02/07/2018 CLINICAL DATA:  Flank pain with stone disease suspected. Concern for UTI and kidney failure. EXAM: CT ABDOMEN AND PELVIS WITHOUT CONTRAST TECHNIQUE: Multidetector CT imaging of the abdomen and pelvis was performed following the standard protocol without IV contrast. COMPARISON:  08/08/2016 FINDINGS: Lower chest:  Status post CABG. Hepatobiliary: No focal liver abnormality.No evidence of biliary obstruction or stone. Pancreas: Unremarkable. Spleen: Unremarkable. Adrenals/Urinary Tract: Negative adrenals. No hydronephrosis or stone. Right renal cystic density. Circumferential bladder wall thickening, chronic when compared to prior, possible sequela of chronic outlet obstruction given small diverticulum/cellule superiorly, also seen previously. No over distention currently. Stomach/Bowel: Extensive distal colonic diverticulosis. Stool throughout much  of the colon without over distension. Probable pill in the enteric stream. Nonvisualized appendix. No pericecal inflammation. Vascular/Lymphatic: Atherosclerosis with infrarenal fusiform aneurysmal enlargement of the aorta to 3.7 cm. Newly enlarged lymph node along the left common iliac artery measuring 15 mm short axis. Reproductive:Negative. Other: No ascites or pneumoperitoneum. Musculoskeletal: Degenerative changes without acute  finding. IMPRESSION: 1. No acute finding.  No hydronephrosis or ureteral stone. 2. Chronic bladder wall thickening, possible chronic outlet obstruction given diverticula/cellule appearance. 3. Single newly enlarged lymph node along the left common iliac artery, nonspecific in isolation. Suggest noncontrast abdominal CT in 3 months. 4.  Aortic Atherosclerosis (ICD10-I70.0). 5. Abdominal aortic aneurysm measuring up to 3.7 cm. Recommend followup by ultrasound in 2 years. This recommendation follows ACR consensus guidelines: White Paper of the ACR Incidental Findings Committee II on Vascular Findings. J Am Coll Radiol 2013; 10:789-794. Electronically Signed   By: Monte Fantasia M.D.   On: 02/07/2018 20:59    Procedures Procedures (including critical care time)  Medications Ordered in ED Medications  sodium chloride 0.9 % bolus 1,000 mL (1,000 mLs Intravenous Bolus 02/07/18 2117)  ondansetron (ZOFRAN) injection 4 mg (4 mg Intravenous Given 02/07/18 2117)  morphine 4 MG/ML injection 4 mg (4 mg Intravenous Given 02/07/18 2117)  phenazopyridine (PYRIDIUM) tablet 200 mg (200 mg Oral Given 02/07/18 2220)     Initial Impression / Assessment and Plan / ED Course  I have reviewed the triage vital signs and the nursing notes.  Pertinent labs & imaging results that were available during my care of the patient were reviewed by me and considered in my medical decision making (see chart for details).    Pt is feeling better after IVFs and zofran/morphine.  No evidence of an acute  infection, but he does have chronic bladder thickening with possible outlet obstruction.  Pt instructed to f/u with urology.  Pt also has had periodic n/v for the past year.  He has not had an egd.  [facility] has seen [facility] in the past and is instructed to f/u with them.  Return if worse and f/u with pcp.  Final Clinical Impressions(s) / ED Diagnoses   Final diagnoses:  Bladder wall thickening  Dehydration  Non-intractable vomiting with nausea, unspecified vomiting type    ED Discharge Orders        Ordered    Ambulatory referral to Urology     02/07/18 2214    phenazopyridine (PYRIDIUM) 200 MG tablet  3 times daily     02/07/18 2222    ondansetron (ZOFRAN ODT) 4 MG disintegrating tablet  Every 8 hours PRN     02/07/18 2222       [name], MD 02/07/18 2224

## 2018-02-07 NOTE — ED Notes (Signed)
Patient transported to CT 

## 2018-02-09 LAB — URINE CULTURE: Culture: NO GROWTH

## 2018-02-12 ENCOUNTER — Other Ambulatory Visit: Payer: Self-pay | Admitting: Gastroenterology

## 2018-02-12 DIAGNOSIS — R1310 Dysphagia, unspecified: Secondary | ICD-10-CM

## 2018-02-14 ENCOUNTER — Ambulatory Visit
Admission: RE | Admit: 2018-02-14 | Discharge: 2018-02-14 | Disposition: A | Discharge status: Home / Self Care | Payer: Medicare Other | Source: Ambulatory Visit | Attending: Gastroenterology | Admitting: Gastroenterology

## 2018-02-14 DIAGNOSIS — R1310 Dysphagia, unspecified: Secondary | ICD-10-CM

## 2018-02-21 NOTE — Progress Notes (Addendum)
[facility] Note  02/22/2018     CHIEF COMPLAINT Patient presents for Retina Evaluation and Diabetic Eye Exam   HISTORY OF PRESENT ILLNESS: Benjamin Harrison is a 83 y.o. male who presents to the clinic today for:   HPI    Retina Evaluation    In both eyes.  This started 2 months ago.  Associated Symptoms Pain and Photophobia.  Negative for Flashes, Floaters, Blind Spot, Scalp Tenderness, Fever, Weight Loss, Jaw Claudication, Glare, Distortion, Redness, Trauma, Shoulder/Hip pain and Fatigue.  Context:  distance vision, mid-range vision and near vision.  Treatments tried include surgery and artificial tears.  Response to treatment was significant improvement.  I, the attending physician,  performed the HPI with the patient and updated documentation appropriately.          Diabetic Eye Exam    Vision fluctuates with blood sugars.  Associated Symptoms Negative for Distortion, Redness, Trauma, Shoulder/Hip pain, Fatigue, Weight Loss, Jaw Claudication, Glare, Pain, Floaters, Flashes, Blind Spot, Photophobia, Scalp Tenderness and Fever.  Diabetes characteristics include Type 2 and taking oral medications.  This started 15.  Blood sugar level fluctuates.  Last Blood Glucose 80.  Last A1C 6.9.          Comments    Referral of Dr. Monica Martinez for retina evaluation. Patient states he has had pain OD for appx two months, at times he rates the pain 10, when pain is severe, his vision becomes blurry and he has HA's. Pt is DM2 x 15 yrs, Bs fluctuates, Bs 80 this am, A1C 6.9 (3 months ago). PT is on Metformin and glipizide. Pt uses Dry eye gtt's PRN       Last edited by Bernarda Caffey, MD on 02/22/2018 10:38 AM. (History)      Referring physician: [name], [name], [name] [id number]  HISTORICAL INFORMATION:   Selected notes from the MEDICAL RECORD NUMBER Referred by Dr. Oralia Manis for concern of ARMD OD with active choroidal  neovascularization  LEE: 07.17.19 [name]) [BCVA: OD: 20/80 OS: 20/40+1] Ocular Hx-non exu ARMD OS, cataract OS, peripheral pterygium, pseudo OD PMH-DM (last A1C: 6.0, taking glimepiride), HTN, high cholesterol, thyroid disease    CURRENT MEDICATIONS: Current Outpatient Medications (Ophthalmic Drugs)  Medication Sig  . Polyethyl Glycol-Propyl Glycol (SYSTANE FREE OP) Place 1 drop into both eyes 3 (three) times daily as needed (for dry eyes.).    No current facility-administered medications for this visit.  (Ophthalmic Drugs)   Current Outpatient Medications (Other)  Medication Sig  . acetaminophen (TYLENOL) 500 MG tablet Take 1,000 mg by mouth as needed for mild pain.  Marland Kitchen aspirin 81 MG tablet Take 1 tablet (81 mg total) by mouth daily.  Marland Kitchen atorvastatin (LIPITOR) 40 MG tablet TAKE 1 TABLET BY MOUTH  DAILY (Patient taking differently: TAKE 1 TABLET BY MOUTH  DAILY IN THE EVENING.)  . clopidogrel (PLAVIX) 75 MG tablet TAKE 1 TABLET BY MOUTH  DAILY  . fluocinonide (LIDEX) 0.05 % external solution Apply 1 mL topically 2 (two) times daily.  Marland Kitchen glimepiride (AMARYL) 4 MG tablet Take 4 mg by mouth 2 (two) times daily.   . hydrochlorothiazide (HYDRODIURIL) 25 MG tablet Take 1 tablet (25 mg total) by mouth daily.  . isosorbide mononitrate (IMDUR) 120 MG 24 hr tablet Take 1 tablet (120 mg total) by mouth daily.  Marland Kitchen KLOR-CON 10 10 MEQ tablet TAKE 1 TABLET BY MOUTH EVERY DAY  .  levothyroxine (SYNTHROID, LEVOTHROID) 100 MCG tablet Take 100 mcg by mouth daily before breakfast.   . lisinopril (PRINIVIL,ZESTRIL) 10 MG tablet Take 1 tablet (10 mg total) by mouth daily.  . metoprolol tartrate (LOPRESSOR) 25 MG tablet TAKE 1 TABLET BY MOUTH TWO  TIMES DAILY  . ondansetron (ZOFRAN ODT) 4 MG disintegrating tablet Take 1 tablet (4 mg total) by mouth every 8 (eight) hours as needed.  . pantoprazole (PROTONIX) 40 MG tablet Take 1 tablet (40 mg total) by mouth 2 (two) times daily.  . phenazopyridine (PYRIDIUM) 200 MG  tablet Take 1 tablet (200 mg total) by mouth 3 (three) times daily.   No current facility-administered medications for this visit.  (Other)      REVIEW OF SYSTEMS: ROS    Positive for: Endocrine, Eyes   Negative for: Constitutional, Gastrointestinal, Neurological, Skin, Genitourinary, Musculoskeletal, HENT, Cardiovascular, Respiratory, Psychiatric, Allergic/Imm, Heme/Lymph   Last edited by Zenovia Jordan, LPN on 5/62/1308  6:57 AM. (History)       ALLERGIES Allergies  Allergen Reactions  . Codeine Nausea Only    Pt reports this happens off and on    PAST MEDICAL HISTORY Past Medical History:  Diagnosis Date  . AAA (abdominal aortic aneurysm) ([place])   . CHF (congestive heart failure) ([place])   . Coronary artery disease    a. CABG 2001 with early failure of VG-RCA. b. Inf MI after Lexiscan 01/28/2009 s/p RCA stent. c. Stent to dRCA 02/24/09. d. DES to [name]  2012. e. NSTEMI s/p angioplasty for ISR of RCA 09/2011. f. 05/2012 Cath: LM 20-30p, LAD 100p, LCX min irregs, RCA patent prox stent, 45m ISR, VG->Diag nl w/ dzs in D1 prox to graft, VG->RCA 100, VG->OM nl, LIMA->LAD nl, Nl EF->Med Rx; g. 09/2014 low-risk MV.  Marland Kitchen Dyspnea   . GERD (gastroesophageal reflux disease)   . History of echocardiogram    a. 09/2014 Echo: EF 55-60%, mild basal-midinferior HK, triv AI.  Marland Kitchen Hyperlipidemia   . Hypertension   . Hypertensive heart disease   . Hypothyroidism   . Myocardial infarction ([place])   . Osteoarthritis    a.End-stage osteoarthritis, right knee, medial compartment  . Personal history of tobacco use   . Skin cancer   . Type II diabetes mellitus ([place])    Past Surgical History:  Procedure Laterality Date  . CARDIAC CATHETERIZATION    . CARDIAC CATHETERIZATION N/A 12/06/2015   Procedure: Left Heart Cath and Cors/Grafts Angiography;  Surgeon: Burnell Blanks, MD;  Location: [facility];  Service: Cardiovascular;  Laterality: N/A;  . CARDIAC CATHETERIZATION N/A 12/06/2015    Procedure: Coronary Balloon Angioplasty;  Surgeon: Burnell Blanks, MD;  Location: [facility];  Service: Cardiovascular;  Laterality: N/A;  . CATARACT EXTRACTION    . CORONARY ARTERY BYPASS GRAFT    . EYE SURGERY    . HERNIA REPAIR  1985  . INTRAVASCULAR ULTRASOUND/IVUS N/A 10/15/2017   Procedure: Intravascular Ultrasound/IVUS;  Surgeon: Belva Crome, MD;  Location: [facility];  Service: Cardiovascular;  Laterality: N/A;  . KNEE ARTHROSCOPY  1999 and 2004  . LEFT HEART CATH AND CORS/GRAFTS ANGIOGRAPHY N/A 10/15/2017   Procedure: LEFT HEART CATH AND CORS/GRAFTS ANGIOGRAPHY;  Surgeon: Belva Crome, MD;  Location: [facility];  Service: Cardiovascular;  Laterality: N/A;  . LEFT HEART CATHETERIZATION WITH CORONARY ANGIOGRAM N/A 10/09/2011   Procedure: LEFT HEART CATHETERIZATION WITH CORONARY ANGIOGRAM;  Surgeon: Peter M Martinique, MD;  Location: [facility] CATH  LAB;  Service: Cardiovascular;  Laterality: N/A;  . LEFT HEART CATHETERIZATION WITH CORONARY/GRAFT ANGIOGRAM N/A 06/06/2012   Procedure: LEFT HEART CATHETERIZATION WITH Beatrix Fetters;  Surgeon: Peter M Martinique, MD;  Location: [facility] CATH LAB;  Service: Cardiovascular;  Laterality: N/A;    FAMILY HISTORY Family History  Problem Relation Age of Onset  . Hypertension Mother   . Unexplained death Mother   . Throat cancer Sister   . Heart disease Brother   . Pneumonia Brother   . Heart disease Brother   . Prostate cancer Brother   . Unexplained death Brother        at birth  . Unexplained death Brother     SOCIAL HISTORY Social History   Tobacco Use  . Smoking status: Former Smoker    Last attempt to quit: 08/01/1975    Years since quitting: 42.5  . Smokeless tobacco: Never Used  Substance Use Topics  . Alcohol use: No  . Drug use: No         OPHTHALMIC EXAM:  Base Eye Exam    Visual Acuity (Snellen - Linear)      Right Left   Dist cc 20/30 -1 20/25 -2   Dist ph cc NI NI   Correction:   Glasses       Tonometry (Tonopen, 9:24 AM)      Right Left   Pressure 12 9       Pupils      Dark Light Shape React APD   Right 3 2 Round Brisk None   Left 3 2 Round Brisk None       Visual Fields      Left Right    Full Full       Extraocular Movement      Right Left    Full, Ortho Full, Ortho       Neuro/Psych    Oriented x3:  Yes   Mood/Affect:  Normal       Dilation    Both eyes:  1.0% Mydriacyl, 2.5% Phenylephrine @ 9:24 AM        Slit Lamp and Fundus Exam    Slit Lamp Exam      Right Left   Lids/Lashes UL ptosis; telangectasia; lash ptosis UL ptosis; telangectasia; lash ptosis   Conjunctiva/Sclera White and quiet White and quiet   Cornea 3+ diffuse PEE 2+ diffuse PEE   Anterior Chamber Deep and quiet Deep and quiet   Iris Round, dilated Round, dilated   Lens PCIOL; open PC PCIOL; 1+ PCO   Vitreous syneresis syneresis       Fundus Exam      Right Left   Disc Compact Pink and Sharp   C/D Ratio 0.2 0.25   Macula Blunted foveal reflex, RPE mottling and clumping, Drusen, no heme Blunted foveal reflex, RPE mottling and clumping, Drusen, no heme   Vessels Vascular attenuation Vascular attenuation   Periphery Attached, 360 Reticular degeneration Attached, 360 Reticular degeneration        Refraction    Wearing Rx      Sphere Cylinder Axis Add   Right -2.50 +1.25 179 +2.50   Left [place] +0.50 180 +2.50       Manifest Refraction      Sphere Cylinder Axis Dist VA   Right -2.25 +1.25 179 20/30   Left -0.50 +0.50 180 20/25          IMAGING AND PROCEDURES  Imaging and Procedures for @TODAY @  OCT, Retina -  OU - Both Eyes       Right Eye Quality was good. Central Foveal Thickness: 309. Progression has no prior data. Findings include normal foveal contour, no IRF, no SRF, epiretinal membrane, retinal drusen .   Left Eye Quality was good. Central Foveal Thickness: 305. Progression has no prior data. Findings include normal foveal contour, no IRF,  no SRF, retinal drusen , epiretinal membrane.   Notes *Images captured and stored on drive  Diagnosis / Impression:  Non Exudative ARMD OU No DME OU  Clinical management:  See below  Abbreviations: NFP - Normal foveal profile. CME - cystoid macular edema. PED - pigment epithelial detachment. IRF - intraretinal fluid. SRF - subretinal fluid. EZ - ellipsoid zone. ERM - epiretinal membrane. ORA - outer retinal atrophy. ORT - outer retinal tubulation. SRHM - subretinal hyper-reflective material                  ASSESSMENT/PLAN:    ICD-10-CM   1. Intermediate stage nonexudative age-related macular degeneration of both eyes H35.3132   2. Diabetes mellitus type 2 without retinopathy ([place]) E11.9   3. Retinal edema H35.81 OCT, Retina - OU - Both Eyes  4. Essential hypertension I10   5. Hypertensive retinopathy of both eyes H35.033   6. Pseudophakia of both eyes Z96.1   7. Dry eyes, bilateral H04.123   8. Eyelash ptosis of both eyes H02.403     1. Age related macular degeneration, non-exudative, both eyes  - intermediate stage with no active CNVM, IRF or SRF  - The incidence, anatomy, and pathology of dry AMD, risk of progression, and the AREDS and AREDS 2 study including smoking risks discussed with patient.  - Recommend amsler grid monitoring  - f/u 4 months  2. Diabetes mellitus, type 2 without retinopathy - The incidence, risk factors for progression, natural history and treatment options for diabetic retinopathy  were discussed with patient.   - The need for close monitoring of blood glucose, blood pressure, and serum lipids, avoiding cigarette or any type of tobacco, and the need for long term follow up was also discussed with patient. - monitor  3. No retinal edema on exam or OCT  4,5. Hypertensive retinopathy OU - discussed importance of tight BP control - monitor  6. Pseudophakia OU  - s/p CE/IOL  - beautiful surgery, doing well  - monitor  7. DES OU - dry  surface likely a significant contributor to symptoms of blurry vision - recommend artificial tears OU QID and lubricating ointment as needed - monitor  6. Lash Ptosis bilaterally-  - s/p lid surgery with Dr. Kristeen Miss - lash ptosis  - monitor  Ophthalmic Meds Ordered this visit:  No orders of the defined types were placed in this encounter.      Return in about 4 months (around 06/25/2018) for F/U Non Exu AMD OU, DFE, OCT.  There are no Patient Instructions on file for this visit.   Explained the diagnoses, plan, and follow up with the patient and they expressed understanding.  Patient expressed understanding of the importance of proper follow up care.  This document serves as a record of services personally performed by Gardiner Sleeper, MD, PhD. It was created on their behalf by Ernest Mallick, OA, an ophthalmic assistant. The creation of this record is the provider's dictation and/or activities during the visit.    Electronically signed by: Ernest Mallick, OA  07.25.19 12:16 AM     Gardiner Sleeper, M.D., Ph.D. Diseases &  Surgery of the Retina and Vitreous [facility] 02/22/18     Abbreviations: M myopia (nearsighted); A astigmatism; H hyperopia (farsighted); P presbyopia; Mrx spectacle prescription;  CTL contact lenses; OD right eye; OS left eye; OU both eyes  XT exotropia; ET esotropia; PEK punctate epithelial keratitis; PEE punctate epithelial erosions; DES dry eye syndrome; MGD meibomian gland dysfunction; ATs artificial tears; PFAT's preservative free artificial tears; [place] nuclear sclerotic cataract; PSC posterior subcapsular cataract; ERM epi-retinal membrane; PVD posterior vitreous detachment; RD retinal detachment; DM diabetes mellitus; DR diabetic retinopathy; NPDR non-proliferative diabetic retinopathy; PDR proliferative diabetic retinopathy; CSME clinically significant macular edema; DME diabetic macular edema; dbh dot blot hemorrhages; CWS cotton wool  spot; POAG primary open angle glaucoma; C/D cup-to-disc ratio; HVF humphrey visual field; GVF goldmann visual field; OCT optical coherence tomography; IOP intraocular pressure; BRVO Branch retinal vein occlusion; CRVO central retinal vein occlusion; CRAO central retinal artery occlusion; BRAO branch retinal artery occlusion; RT retinal tear; SB scleral buckle; PPV pars plana vitrectomy; VH Vitreous hemorrhage; PRP panretinal laser photocoagulation; IVK intravitreal kenalog; VMT vitreomacular traction; MH Macular hole;  NVD neovascularization of the disc; NVE neovascularization elsewhere; AREDS age related eye disease study; ARMD age related macular degeneration; POAG primary open angle glaucoma; EBMD epithelial/anterior basement membrane dystrophy; ACIOL anterior chamber intraocular lens; IOL intraocular lens; PCIOL posterior chamber intraocular lens; Phaco/IOL phacoemulsification with intraocular lens placement; Paint photorefractive keratectomy; LASIK laser assisted in situ keratomileusis; HTN hypertension; DM diabetes mellitus; COPD chronic obstructive pulmonary disease

## 2018-02-22 ENCOUNTER — Ambulatory Visit (INDEPENDENT_AMBULATORY_CARE_PROVIDER_SITE_OTHER): Payer: Medicare Other | Admitting: Ophthalmology

## 2018-02-22 ENCOUNTER — Encounter (INDEPENDENT_AMBULATORY_CARE_PROVIDER_SITE_OTHER): Payer: Self-pay | Admitting: Ophthalmology

## 2018-02-22 DIAGNOSIS — H04123 Dry eye syndrome of bilateral lacrimal glands: Secondary | ICD-10-CM

## 2018-02-22 DIAGNOSIS — H353132 Nonexudative age-related macular degeneration, bilateral, intermediate dry stage: Secondary | ICD-10-CM

## 2018-02-22 DIAGNOSIS — I10 Essential (primary) hypertension: Secondary | ICD-10-CM

## 2018-02-22 DIAGNOSIS — E119 Type 2 diabetes mellitus without complications: Secondary | ICD-10-CM | POA: Diagnosis not present

## 2018-02-22 DIAGNOSIS — H35033 Hypertensive retinopathy, bilateral: Secondary | ICD-10-CM

## 2018-02-22 DIAGNOSIS — H02403 Unspecified ptosis of bilateral eyelids: Secondary | ICD-10-CM

## 2018-02-22 DIAGNOSIS — H3581 Retinal edema: Secondary | ICD-10-CM

## 2018-02-22 DIAGNOSIS — Z961 Presence of intraocular lens: Secondary | ICD-10-CM

## 2018-02-23 ENCOUNTER — Encounter (INDEPENDENT_AMBULATORY_CARE_PROVIDER_SITE_OTHER): Payer: Self-pay | Admitting: Ophthalmology

## 2018-02-26 ENCOUNTER — Telehealth: Payer: Self-pay | Admitting: Cardiovascular Disease

## 2018-02-26 NOTE — Telephone Encounter (Signed)
   Primary Cardiologist: Mertie Moores, MD  Chart reviewed as part of pre-operative protocol coverage. Given past medical history and time since last visit, based on ACC/AHA guidelines, Benjamin Harrison would be at acceptable  But moderate risk for the planned procedure without further cardiovascular testing. He has chronic chest pain.  I discussed with Dr. Acie Fredrickson and it would be ok to stop Plavix for 7 days prior to procedure.      I will route this recommendation to the requesting party via Epic fax function and remove from pre-op pool.  Please call with questions.  Cecilie Kicks, NP 02/26/2018, 4:26 PM

## 2018-02-26 NOTE — Telephone Encounter (Signed)
Please resume plavix day post biopsy or as soon as safe

## 2018-02-26 NOTE — Telephone Encounter (Signed)
   [facility] Pre-operative Risk Assessment    Request for surgical clearance:  1. What type of surgery is being performed?  Prostate Biopsy/US   2. When is this surgery scheduled? TBD   3. What type of clearance is required (medical clearance vs. Pharmacy clearance to hold med vs. Both)?  Both  4. Are there any medications that need to be held prior to surgery and how long? Plavix 7 days prior   5. Practice name and name of physician performing surgery? [facility] Dr. Gloriann Loan   6. What is your office phone number? [phone]    7.   What is your office fax number? [phone]  8.   Anesthesia type (None, local, MAC, general) ? Not listed   _________________________________________________________________   (provider comments below)

## 2018-03-26 ENCOUNTER — Other Ambulatory Visit (HOSPITAL_COMMUNITY): Payer: Self-pay | Admitting: Family Medicine

## 2018-03-26 DIAGNOSIS — C439 Malignant melanoma of skin, unspecified: Secondary | ICD-10-CM

## 2018-04-04 ENCOUNTER — Ambulatory Visit (HOSPITAL_COMMUNITY)
Admission: RE | Admit: 2018-04-04 | Discharge: 2018-04-04 | Disposition: A | Discharge status: Home / Self Care | Payer: Medicare Other | Source: Ambulatory Visit | Attending: Family Medicine | Admitting: Family Medicine

## 2018-04-04 DIAGNOSIS — I714 Abdominal aortic aneurysm, without rupture: Secondary | ICD-10-CM | POA: Insufficient documentation

## 2018-04-04 DIAGNOSIS — R590 Localized enlarged lymph nodes: Secondary | ICD-10-CM | POA: Insufficient documentation

## 2018-04-04 DIAGNOSIS — Z85820 Personal history of malignant melanoma of skin: Secondary | ICD-10-CM | POA: Diagnosis not present

## 2018-04-04 DIAGNOSIS — I700 Atherosclerosis of aorta: Secondary | ICD-10-CM | POA: Diagnosis not present

## 2018-04-04 DIAGNOSIS — C439 Malignant melanoma of skin, unspecified: Secondary | ICD-10-CM

## 2018-04-04 LAB — GLUCOSE, CAPILLARY: Glucose-Capillary: 106 mg/dL — ABNORMAL HIGH (ref 70–99)

## 2018-04-04 MED ORDER — FLUDEOXYGLUCOSE F - 18 (FDG) INJECTION
8.4200 | Freq: Once | INTRAVENOUS | Status: AC
  Administered 2018-04-04: 8.42 via INTRAVENOUS

## 2018-04-09 ENCOUNTER — Telehealth: Payer: Self-pay | Admitting: Cardiovascular Disease

## 2018-04-09 NOTE — Telephone Encounter (Signed)
New message:     [facility] is calling to inquire about the pt's clearance. The pt's procedure it tomorrow. Please fax to [phone].  Request for surgical clearance:  1. What type of surgery is being performed?  Prostate Biopsy/US   2. When is this surgery scheduled? TBD   3. What type of clearance is required (medical clearance vs. Pharmacy clearance to hold med vs. Both)?  Both  4. Are there any medications that need to be held prior to surgery and how long? Plavix 7 days prior   5. Practice name and name of physician performing surgery? [facility] Dr. Gloriann Loan   6. What is your office phone number? [phone]    7.   What is your office fax number? [phone]  8.   Anesthesia type (None, local, MAC, general) ? Not listed

## 2018-04-17 ENCOUNTER — Other Ambulatory Visit: Payer: Self-pay | Admitting: Urology

## 2018-04-17 DIAGNOSIS — C61 Malignant neoplasm of prostate: Secondary | ICD-10-CM

## 2018-05-02 ENCOUNTER — Encounter (HOSPITAL_COMMUNITY)
Admission: RE | Admit: 2018-05-02 | Discharge: 2018-05-02 | Disposition: A | Discharge status: Home / Self Care | Payer: Medicare Other | Source: Ambulatory Visit | Attending: Urology | Admitting: Urology

## 2018-05-02 DIAGNOSIS — C61 Malignant neoplasm of prostate: Secondary | ICD-10-CM | POA: Diagnosis present

## 2018-05-02 LAB — GLUCOSE, CAPILLARY: Glucose-Capillary: 85 mg/dL (ref 70–99)

## 2018-05-02 MED ORDER — TECHNETIUM TC 99M MEDRONATE IV KIT: 20.0000 | PACK | Freq: Once | INTRAVENOUS | Status: DC

## 2018-05-24 ENCOUNTER — Ambulatory Visit: Payer: Medicare Other | Admitting: Cardiovascular Disease

## 2018-05-28 ENCOUNTER — Encounter: Payer: Self-pay | Admitting: Radiation Oncology

## 2018-05-28 NOTE — Progress Notes (Signed)
GU Location of Tumor / Histology: Prostatic adenocarcinoma with node involvement.  If Prostate Cancer, Gleason Score is (4 + 3) and PSA is (37.30) on 02/21/2018. Prostate volume: 51.34 grams  [name] was experiencing GI upset including nausea and vomiting every 2-3 months. These symptoms were accompanied by dysuria and increased voiding complaints.   Biopsies of prostate (if applicable) revealed:    Past/Anticipated interventions by urology, if any: prostate biopsy, bone scan (right fifth rib), [place] received the week of October 14th, referral to radiation oncology  Past/Anticipated interventions by medical oncology, if any: no  Weight changes, if any: Over the course of this year the patient has lost down from 187 to 164 unintentionally  Bowel/Bladder complaints, if any: Reports dysuria is less since starting tamsulosin. IPSS 16. SHIM 1. Denies hematuria. Reports urinary leakage x several months worse since prostate biopsy.  Nausea/Vomiting, if any: resolved  Pain issues, if any:  New onset pain right side of neck that radiates around to his right ear and eye. Also, new onset pain in lower abdomen. Reports Tylenol sometimes helps to relieve this pain and other times not.   SAFETY ISSUES:  Prior radiation? Yes. Ionized thyroid radiation.  Pacemaker/ICD? no  Possible current pregnancy? no  Is the patient on methotrexate? no  Current Complaints / other details:  83 year old male. Married with one son and one daughter. Resides in [place] with his wife.

## 2018-05-30 ENCOUNTER — Other Ambulatory Visit: Payer: Self-pay

## 2018-05-30 ENCOUNTER — Encounter: Payer: Self-pay | Admitting: Radiation Oncology

## 2018-05-30 ENCOUNTER — Ambulatory Visit
Admission: RE | Admit: 2018-05-30 | Discharge: 2018-05-30 | Disposition: A | Discharge status: Home / Self Care | Payer: Medicare Other | Source: Ambulatory Visit | Attending: Radiation Oncology | Admitting: Radiation Oncology

## 2018-05-30 VITALS — BP 145/73 | HR 52 | Temp 98.6°F | Resp 20 | Ht 65.0 in | Wt 164.6 lb

## 2018-05-30 DIAGNOSIS — Z951 Presence of aortocoronary bypass graft: Secondary | ICD-10-CM | POA: Diagnosis not present

## 2018-05-30 DIAGNOSIS — I252 Old myocardial infarction: Secondary | ICD-10-CM | POA: Insufficient documentation

## 2018-05-30 DIAGNOSIS — Z85820 Personal history of malignant melanoma of skin: Secondary | ICD-10-CM | POA: Insufficient documentation

## 2018-05-30 DIAGNOSIS — I2510 Atherosclerotic heart disease of native coronary artery without angina pectoris: Secondary | ICD-10-CM | POA: Insufficient documentation

## 2018-05-30 DIAGNOSIS — Z7982 Long term (current) use of aspirin: Secondary | ICD-10-CM | POA: Insufficient documentation

## 2018-05-30 DIAGNOSIS — Z7984 Long term (current) use of oral hypoglycemic drugs: Secondary | ICD-10-CM | POA: Insufficient documentation

## 2018-05-30 DIAGNOSIS — I110 Hypertensive heart disease with heart failure: Secondary | ICD-10-CM | POA: Insufficient documentation

## 2018-05-30 DIAGNOSIS — I509 Heart failure, unspecified: Secondary | ICD-10-CM | POA: Diagnosis not present

## 2018-05-30 DIAGNOSIS — C61 Malignant neoplasm of prostate: Secondary | ICD-10-CM | POA: Diagnosis present

## 2018-05-30 DIAGNOSIS — Z79899 Other long term (current) drug therapy: Secondary | ICD-10-CM | POA: Diagnosis not present

## 2018-05-30 DIAGNOSIS — E039 Hypothyroidism, unspecified: Secondary | ICD-10-CM | POA: Diagnosis not present

## 2018-05-30 DIAGNOSIS — E119 Type 2 diabetes mellitus without complications: Secondary | ICD-10-CM | POA: Diagnosis not present

## 2018-05-30 HISTORY — DX: Malignant neoplasm of prostate: C61

## 2018-05-30 NOTE — Progress Notes (Signed)
See progress note under physician encounter. 

## 2018-05-30 NOTE — Progress Notes (Signed)
Radiation Oncology         [phone] ________________________________  Initial outpatient Consultation  Name: Benjamin Harrison MRN: [id number]  Date: 05/30/2018  DOB: Feb 10, 1935  WU:[id number], Curt Jews, MD  Lucas Mallow, MD   REFERRING PHYSICIAN: Lucas Mallow, MD  DIAGNOSIS: 83 y.o. 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    ICD-10-CM   1. Malignant neoplasm of prostate ([place]) C61     HISTORY OF PRESENT ILLNESS: Benjamin Harrison is a 83 y.o. male with a diagnosis of prostate cancer. He has a long-standing history of BPH with bladder outlet obstructive symptoms and recurrent UTIs, previously managed by his PCP.  He would present to his PCP with GI upset including nausea and vomiting every 2-3 months, sometimes treated for presumed urinary tract infection.   Accordingly, he was referred for evaluation in urology by Dr. Gloriann Loan on 02/21/2018,  digital rectal examination was performed at that time revealing significant firmness and nodularity.  PSA was also obtained at that time and was noted to be significantly elevated at 37.3. He was started on Flomax daily which has helped his LUTS slightly. The patient proceeded to transrectal ultrasound with 12 biopsies of the prostate on 04/10/2018.  The prostate volume measured 51.34 grams.  Out of 12 core biopsies, all 12 were positive.  The maximum Gleason score was 4+3, and this was seen in all but the left apex which was Gleason 3+4. Perineural invasion was identified in all cores except the left base lateral and extracapsular extension was identified in left mid lateral, left base, right base lateral, and right base.  He had a CT A/P in July 2019 for evaluation of abdominal pain with suspected nephrolithiasis and this was negative for any lymphadenopathy or evidence of metastatic disease in the abdomen or pelvis.  He underwent PET scan on 04/04/2018 for disease restaging given his  history of melanoma, which showed: mild to moderate increased uptake corresponding to left retroperitoneal and left common iliac lymph nodes which are borderline enlarged with nonspecific appearance, but metastatic adenopathy cannot be excluded; subtle area of sclerosis involving the lateral aspect of the right fifth rib with corresponding mild increased uptake within SUV max of 3.56, which could represent posttraumatic change, but underlying metastasis is not excluded.  He also underwent bone scan on 05/02/2018, which showed: focal abnormal uptake in the lateral right fifth rib corresponding to the abnormality on PET-CT and indeterminate for a metastasis versus traumatic etiology; no other evidence of osseous metastatic disease.  The patient does not recall having prior trauma in the right fifth rib area and denies any current pain in that area.  He has already initiated ADT with Dr. Gloriann Loan approximately 3 to 4 weeks ago.  The patient reviewed the biopsy results with his urologist and he has kindly been referred today for discussion of potential radiation treatment options.   PREVIOUS RADIATION THERAPY: Yes, ionized thyroid radiation  PAST MEDICAL HISTORY:  Past Medical History:  Diagnosis Date  . AAA (abdominal aortic aneurysm) ([place])   . CHF (congestive heart failure) ([place])   . Coronary artery disease    a. CABG 2001 with early failure of VG-RCA. b. Inf MI after Lexiscan 01/28/2009 s/p RCA stent. c. Stent to dRCA 02/24/09. d. DES to [place]  2012. e. NSTEMI s/p angioplasty for ISR of RCA 09/2011. f. 05/2012 Cath: LM 20-30p, LAD 100p, LCX min irregs, RCA patent  prox stent, 85m ISR, VG->Diag nl w/ dzs in D1 prox to graft, VG->RCA 100, VG->OM nl, LIMA->LAD nl, Nl EF->Med Rx; g. 09/2014 low-risk MV.  Marland Kitchen Dyspnea   . GERD (gastroesophageal reflux disease)   . History of echocardiogram    a. 09/2014 Echo: EF 55-60%, mild basal-midinferior HK, triv AI.  Marland Kitchen Hyperlipidemia   . Hypertension   . Hypertensive  heart disease   . Hypothyroidism   . Myocardial infarction ([place])   . Osteoarthritis    a.End-stage osteoarthritis, right knee, medial compartment  . Personal history of tobacco use   . Prostate cancer ([place])   . Skin cancer    melanoma  . Type II diabetes mellitus ([place])       PAST SURGICAL HISTORY: Past Surgical History:  Procedure Laterality Date  . CARDIAC CATHETERIZATION    . CARDIAC CATHETERIZATION N/A 12/06/2015   Procedure: Left Heart Cath and Cors/Grafts Angiography;  Surgeon: Burnell Blanks, MD;  Location: [facility];  Service: Cardiovascular;  Laterality: N/A;  . CARDIAC CATHETERIZATION N/A 12/06/2015   Procedure: Coronary Balloon Angioplasty;  Surgeon: Burnell Blanks, MD;  Location: [facility];  Service: Cardiovascular;  Laterality: N/A;  . CATARACT EXTRACTION    . CORONARY ARTERY BYPASS GRAFT    . EYE SURGERY    . HERNIA REPAIR  1985  . INTRAVASCULAR ULTRASOUND/IVUS N/A 10/15/2017   Procedure: Intravascular Ultrasound/IVUS;  Surgeon: Belva Crome, MD;  Location: [facility];  Service: Cardiovascular;  Laterality: N/A;  . KNEE ARTHROSCOPY  1999 and 2004  . LEFT HEART CATH AND CORS/GRAFTS ANGIOGRAPHY N/A 10/15/2017   Procedure: LEFT HEART CATH AND CORS/GRAFTS ANGIOGRAPHY;  Surgeon: Belva Crome, MD;  Location: [facility];  Service: Cardiovascular;  Laterality: N/A;  . LEFT HEART CATHETERIZATION WITH CORONARY ANGIOGRAM N/A 10/09/2011   Procedure: LEFT HEART CATHETERIZATION WITH CORONARY ANGIOGRAM;  Surgeon: Peter M Martinique, MD;  Location: [facility];  Service: Cardiovascular;  Laterality: N/A;  . LEFT HEART CATHETERIZATION WITH CORONARY/GRAFT ANGIOGRAM N/A 06/06/2012   Procedure: LEFT HEART CATHETERIZATION WITH Beatrix Fetters;  Surgeon: Peter M Martinique, MD;  Location: [facility] CATH LAB;  Service: Cardiovascular;  Laterality: N/A;    FAMILY HISTORY:  Family History  Problem Relation Age of Onset  . Hypertension Mother   .  Unexplained death Mother   . Throat cancer Sister   . Lung cancer Sister   . Heart disease Brother   . Pneumonia Brother   . Heart disease Brother   . Prostate cancer Brother   . Unexplained death Brother        at birth  . Unexplained death Brother   . Prostate cancer Son   . Lung cancer Son   . Prostate cancer Other     SOCIAL HISTORY:  Social History   Socioeconomic History  . Marital status: Married    Spouse name: Not on file  . Number of children: 2  . Years of education: Not on file  . Highest education level: Not on file  Occupational History  . Not on file  Social Needs  . Financial resource strain: Not on file  . Food insecurity:    Worry: Not on file    Inability: Not on file  . Transportation needs:    Medical: Not on file    Non-medical: Not on file  Tobacco Use  . Smoking status: Former Smoker    Packs/day: 1.00    Years: 20.00  Pack years: 20.00    Types: Cigarettes    Last attempt to quit: 08/01/1975    Years since quitting: 42.8  . Smokeless tobacco: Never Used  Substance and Sexual Activity  . Alcohol use: No  . Drug use: No  . Sexual activity: Not Currently  Lifestyle  . Physical activity:    Days per week: Not on file    Minutes per session: Not on file  . Stress: Not on file  Relationships  . Social connections:    Talks on phone: Not on file    Gets together: Not on file    Attends religious service: Not on file    Active member of club or organization: Not on file    Attends meetings of clubs or organizations: Not on file    Relationship status: Not on file  . Intimate partner violence:    Fear of current or ex partner: Not on file    Emotionally abused: Not on file    Physically abused: Not on file    Forced sexual activity: Not on file  Other Topics Concern  . Not on file  Social History Narrative   Lives locally with wife.    ALLERGIES: Codeine  MEDICATIONS:  Current Outpatient Medications  Medication Sig Dispense  Refill  . acetaminophen (TYLENOL) 500 MG tablet Take 1,000 mg by mouth as needed for mild pain.    Marland Kitchen aspirin 81 MG tablet Take 1 tablet (81 mg total) by mouth daily.    Marland Kitchen atorvastatin (LIPITOR) 40 MG tablet TAKE 1 TABLET BY MOUTH  DAILY (Patient taking differently: TAKE 1 TABLET BY MOUTH  DAILY IN THE EVENING.) 90 tablet 3  . clopidogrel (PLAVIX) 75 MG tablet TAKE 1 TABLET BY MOUTH  DAILY 90 tablet 3  . fluocinonide (LIDEX) 0.05 % external solution Apply 1 mL topically 2 (two) times daily.  3  . glimepiride (AMARYL) 4 MG tablet Take 4 mg by mouth 2 (two) times daily.     . hydrochlorothiazide (HYDRODIURIL) 25 MG tablet Take 1 tablet (25 mg total) by mouth daily. 90 tablet 3  . isosorbide mononitrate (IMDUR) 120 MG 24 hr tablet Take 1 tablet (120 mg total) by mouth daily. 90 tablet 3  . KLOR-CON 10 10 MEQ tablet TAKE 1 TABLET BY MOUTH EVERY DAY 90 tablet 2  . levothyroxine (SYNTHROID, LEVOTHROID) 100 MCG tablet Take 100 mcg by mouth daily before breakfast.     . lisinopril (PRINIVIL,ZESTRIL) 10 MG tablet Take 1 tablet (10 mg total) by mouth daily. 90 tablet 2  . metFORMIN (GLUCOPHAGE) 1000 MG tablet Take 1,000 mg by mouth 2 (two) times daily with a meal. Reports taking 1000 mg in the morning and 500 mg in the evening.    . metoprolol tartrate (LOPRESSOR) 25 MG tablet TAKE 1 TABLET BY MOUTH TWO  TIMES DAILY 180 tablet 3  . pantoprazole (PROTONIX) 40 MG tablet Take 1 tablet (40 mg total) by mouth 2 (two) times daily. 60 tablet 0  . phenazopyridine (PYRIDIUM) 200 MG tablet Take 1 tablet (200 mg total) by mouth 3 (three) times daily. 6 tablet 0  . Polyethyl Glycol-Propyl Glycol (SYSTANE FREE OP) Place 1 drop into both eyes 3 (three) times daily as needed (for dry eyes.).     Marland Kitchen ondansetron (ZOFRAN ODT) 4 MG disintegrating tablet Take 1 tablet (4 mg total) by mouth every 8 (eight) hours as needed. (Patient not taking: Reported on 05/30/2018) 10 tablet 0   No current facility-administered medications  for  this encounter.     REVIEW OF SYSTEMS:  On review of systems, the patient reports that he is doing well overall. He denies any chest pain, shortness of breath, cough, fevers, chills, night sweats. He notes issues with diarrhea as a baseline. He notes resolved nausea and vomiting. He reports new onset pain to right side of neck that radiates around to his eight ear and eye and in lower abdomen. His IPSS was 16, indicating moderate urinary symptoms. He reports dysuria (less since starting tamsulosin) and urinary leakage (worse since biopsy). His SHIM was 1, indicating he does have severe erectile dysfunction. A complete review of systems is obtained and is otherwise negative.    PHYSICAL EXAM:  Wt Readings from Last 3 Encounters:  05/30/18 164 lb 9.6 oz (74.7 kg)  02/07/18 169 lb (76.7 kg)  01/16/18 169 lb (76.7 kg)   Temp Readings from Last 3 Encounters:  05/30/18 98.6 F (37 C) (Oral)  02/07/18 98 F (36.7 C) (Oral)  01/16/18 97.9 F (36.6 C) (Oral)   BP Readings from Last 3 Encounters:  05/30/18 (!) 145/73  02/07/18 124/80  01/16/18 (!) 153/87   Pulse Readings from Last 3 Encounters:  05/30/18 (!) 52  02/07/18 66  01/16/18 (!) 119   Pain Assessment Pain Score: 0-No pain/10  In general this is a well appearing Caucasian gentleman in no acute distress. He is alert and oriented x4 and appropriate throughout the examination. HEENT reveals that the patient is normocephalic, atraumatic. EOMs are intact. PERRLA. Skin is intact without any evidence of gross lesions. Cardiovascular exam reveals a regular rate and rhythm, no clicks rubs or murmurs are auscultated. Chest is clear to auscultation bilaterally. Lymphatic assessment is performed and does not reveal any adenopathy in the cervical, supraclavicular, axillary, or inguinal chains. Abdomen has active bowel sounds in all quadrants and is intact. The abdomen is soft, non tender, non distended. Lower extremities are negative for pretibial  pitting edema, deep calf tenderness, cyanosis or clubbing.   KPS = 90  100 - Normal; no complaints; no evidence of disease. 90   - Able to carry on normal activity; minor signs or symptoms of disease. 80   - Normal activity with effort; some signs or symptoms of disease. 26   - Cares for self; unable to carry on normal activity or to do active work. 60   - Requires occasional assistance, but is able to care for most of his personal needs. 50   - Requires considerable assistance and frequent medical care. 66   - Disabled; requires special care and assistance. 40   - Severely disabled; hospital admission is indicated although death not imminent. 50   - Very sick; hospital admission necessary; active supportive treatment necessary. 10   - Moribund; fatal processes progressing rapidly. 0     - Dead  Karnofsky DA, Abelmann Graham, Craver LS and Burchenal [facility] [phone]) The use of the nitrogen mustards in the palliative treatment of carcinoma: with particular reference to bronchogenic carcinoma Cancer 1 634-56  LABORATORY DATA:  Lab Results  Component Value Date   WBC 9.5 02/07/2018   HGB 12.6 (L) 02/07/2018   HCT 39.5 02/07/2018   MCV 76.1 (L) 02/07/2018   PLT 231 02/07/2018   Lab Results  Component Value Date   NA 138 02/07/2018   K 4.6 02/07/2018   CL 101 02/07/2018   CO2 26 02/07/2018   Lab Results  Component Value Date   ALT 15 (L) 01/14/2018  AST 15 01/14/2018   ALKPHOS 47 01/14/2018   BILITOT 1.3 (H) 01/14/2018     RADIOGRAPHY: Nm Bone Scan Whole Body  Result Date: 05/02/2018 CLINICAL DATA:  Newly diagnosed prostate cancer. EXAM: NUCLEAR MEDICINE WHOLE BODY BONE SCAN TECHNIQUE: Whole body anterior and posterior images were obtained approximately 3 hours after intravenous injection of radiopharmaceutical. RADIOPHARMACEUTICALS:  22.0 mCi Technetium-46m MDP IV COMPARISON:  PET-CT 04/04/2018 FINDINGS: There is symmetric radiotracer uptake by the kidneys with excreted tracer in the  bladder. There is focally increased tracer uptake in the lateral aspect of the right fifth rib corresponding to subtle sclerosis and increased FDG uptake on the prior PET-CT. Mildly increased uptake along the left aspect of the lower thoracic spine is likely degenerative. Uptake at the base of the thumbs bilaterally and at the left knee and left foot is also likely degenerative. Photopenia is noted at the right knee related to prior arthroplasty. Uptake elsewhere in the skeleton is symmetric. IMPRESSION: 1. Focal abnormal uptake in the lateral right fifth rib corresponding to the abnormality on PET-CT and indeterminate for a metastasis versus traumatic etiology. 2. No other evidence of osseous metastatic disease. Electronically Signed   By: Logan Bores M.D.   On: 05/02/2018 15:09      IMPRESSION/PLAN: 1. 83 y.o. gentleman with Stage T2c adenocarcinoma of the prostate with Gleason Score of 4+3, and PSA of 37.3. We discussed the patient's workup and outlines the nature of prostate cancer in this setting. The patient's T stage, Gleason's score, and PSA put him into the high risk group.  Additionally, he appears to have locally advanced prostate cancer with pelvic lymph node involvement noted on recent PET imaging.  There is also some concern for potential oligo metastatic disease in the right fifth rib.  Accordingly, he is eligible for 8 weeks of daily external beam radiation in combination with LT-ADT.  We discussed the details and logistics and delivery. We discussed and outlined the risks, benefits, short and long-term effects associated with radiotherapy and compared and contrasted these with prostatectomy. We discussed the role of SpaceOAR in reducing the rectal toxicity associated with radiotherapy. We also detailed the role of ADT in the treatment of high risk prostate cancer and outlined the associated side effects that could be expected with this therapy.  He has already started ADT under the direction  of Dr. Gloriann Loan.  At the conclusion of the conversation the patient is interested in moving forward with 8 weeks of external beam therapy in combination with LT-ADT. He has received his first Lupron injection approximately 2-3 weeks ago. We will contact [facility] to make arrangements for fiducial marker with SpaceOAR to reduce rectal toxicity from radiotherapy placement prior to simulation. He will be scheduled for simulation in the near future. We will share our discussion with Dr. Gloriann Loan and move forward with treatment planning in anticipation of beginning IMRT in the near future.  We spent 60 minutes face to face with the patient and more than 50% of that time was spent in counseling and/or coordination of care.    Nicholos Johns, PA-C    Tyler Pita, MD  [facility] Direct Dial: [phone]  Fax: [phone] [email or web address]  Skype  LinkedIn   This document serves as a record of services personally performed by Tyler Pita, MD and Freeman Caldron, PA-C. It was created on their behalf by Wilburn Mylar, a trained medical scribe. The creation of this record is based on the scribe's personal observations  and the provider's statements to them. This document has been checked and approved by the attending provider.

## 2018-05-31 ENCOUNTER — Encounter: Payer: Self-pay | Admitting: Cardiovascular Disease

## 2018-05-31 ENCOUNTER — Ambulatory Visit: Payer: Medicare Other | Admitting: Cardiovascular Disease

## 2018-05-31 VITALS — BP 118/70 | HR 54 | Ht 65.0 in | Wt 165.0 lb

## 2018-05-31 DIAGNOSIS — I25708 Atherosclerosis of coronary artery bypass graft(s), unspecified, with other forms of angina pectoris: Secondary | ICD-10-CM

## 2018-05-31 DIAGNOSIS — E782 Mixed hyperlipidemia: Secondary | ICD-10-CM

## 2018-05-31 LAB — BASIC METABOLIC PANEL
BUN / CREAT RATIO: 15 (ref 10–24)
BUN: 18 mg/dL (ref 8–27)
CO2: 25 mmol/L (ref 20–29)
Calcium: 9.1 mg/dL (ref 8.6–10.2)
Chloride: 100 mmol/L (ref 96–106)
Creatinine, Ser: 1.19 mg/dL (ref 0.76–1.27)
GFR calc Af Amer: 66 mL/min/{1.73_m2} (ref >59)
GFR calc non Af Amer: 57 mL/min/{1.73_m2} — ABNORMAL LOW (ref >59)
GLUCOSE: 146 mg/dL — AB (ref 65–99)
POTASSIUM: 4.4 mmol/L (ref 3.5–5.2)
Sodium: 140 mmol/L (ref 134–144)

## 2018-05-31 LAB — HEPATIC FUNCTION PANEL
ALT: 22 IU/L (ref 0–44)
AST: 20 IU/L (ref 0–40)
Albumin: 3.6 g/dL (ref 3.5–4.7)
Alkaline Phosphatase: 97 IU/L (ref 39–117)
Bilirubin Total: 0.8 mg/dL (ref 0.0–1.2)
Bilirubin, Direct: 0.21 mg/dL (ref 0.00–0.40)
TOTAL PROTEIN: 6.5 g/dL (ref 6.0–8.5)

## 2018-05-31 LAB — LIPID PANEL
CHOL/HDL RATIO: 3.4 ratio (ref 0.0–5.0)
Cholesterol, Total: 116 mg/dL (ref 100–199)
HDL: 34 mg/dL — AB (ref >39)
LDL Calculated: 60 mg/dL (ref 0–99)
Triglycerides: 111 mg/dL (ref 0–149)
VLDL CHOLESTEROL CAL: 22 mg/dL (ref 5–40)

## 2018-05-31 NOTE — Patient Instructions (Signed)
Medication Instructions:  Your physician recommends that you continue on your current medications as directed. Please refer to the Current Medication list given to you today.  If you need a refill on your cardiac medications before your next appointment, please call your pharmacy.   Lab work: TODAY - cholesterol, liver panel, basic metabolic panel  Your physician recommends that you return for lab work in: 6 months on the day of or a few days before your office visit with Dr. Acie Fredrickson.  You will need to FAST for this appointment - nothing to eat or drink after midnight the night before except water.  If you have labs (blood work) drawn today and your tests are completely normal, you will receive your results only by: Marland Kitchen MyChart Message (if you have MyChart) OR . A paper copy in the mail If you have any lab test that is abnormal or we need to change your treatment, we will call you to review the results.  Testing/Procedures: None Ordered   Follow-Up: At [facility], you and your health needs are our priority.  As part of our continuing mission to provide you with exceptional heart care, we have created designated Provider Care Teams.  These Care Teams include your primary Cardiologist (physician) and Advanced Practice Providers (APPs -  Physician Assistants and Nurse Practitioners) who all work together to provide you with the care you need, when you need it. You will need a follow up appointment in:  6 months.  Please call our office 2 months in advance to schedule this appointment.  You may see Mertie Moores, MD or one of the following Advanced Practice Providers on your designated Care Team: Richardson Dopp, PA-C [place], [place] . Daune Perch, NP

## 2018-05-31 NOTE — Addendum Note (Signed)
Addended by: Emmaline Life on: 05/31/2018 11:00 AM   Modules accepted: Orders

## 2018-05-31 NOTE — Progress Notes (Signed)
Cardiology Office Note   Date:  05/31/2018   ID:  Benjamin Harrison, DOB [birth year], MRN [id number]  PCP:  Benjamin Downing, MD  Cardiologist:   Benjamin Moores, MD   No chief complaint on file.  1. Coronary artery disease: Status post CABG in2001. He status post stenting of his mid right coronary artery in 10/17/2010 2. Hypertension 3. Hypothyroidism 4. Hyperlipidemia 5. Diabetes mellitus    Benjamin Harrison is a 83 year old gentleman with a history of coronary artery disease. He status post PTCA and stenting of his right coronary. He had repeat stenting of his right coronary artery as recently as March, 2012. He had another heart attack and had severe stenosis of his distal right coronary artery stent. Dr. Martinique opened up the stent in March, 2013. Saphenous vein graft to right coronary artery is occluded.  Since that time he's had persistent headaches and generalized weakness. He's continued to have some episodes of chest pain. He's also had some episodes of lightheadedness.  He's feeling a bit better since I saw him several months ago. He still is not able to exercise with about 15 or 20 minutes. Is clear that he still eats salty foods. He enjoys peanut butter on Ritz crackers almost every night. He also eats occasional country ham and gravy biscuits.  October 07, 2012  He is doing well. He had a cath in November, 2013. He has been on Imdur and is feeling better. He occasionally has a headache.   Nov. 24, 2014:  [name] is having more dyspnea with exertion recently - walking outside, taking a shower.  He avoids salt.  He has not had much if any chest pain.   Feb. 24, 2015: Benjamin Harrison is doing ok. He was having some dyspnea but this has resolved.   March 23, 2014: He is doing ok. He stopped taking his atorvastatin due to leg aches. His aches and pains are better since in the atorvastatin. He has lots of problems with arthritis and has taken steroids. Staying  active except as limited by his joints    Feb. 22, 2016: Benjamin Harrison is a 83 y.o. male who presents for follow up of his CAD. He has been having some chest tightness and pain.  Occurred at night.  Was very weak for 3 days. Was just getting ready for bed.   Radiated out to his shoulder.  Occasionally has exertional CP / last for several minutes.  No palpitations,  No dizziness  ,  Does have some orthostatic hypotension.   Dec 04, 2014: Benjamin Harrison was seen recently for some chest pain   Myoview shows: Overall Impression: There is a small area of moderate scar affecting the apical cap and the apical lateral segment. There is no ischemia. This is a low risk scan. The description of the study from December, 2014 is slightly different from this study. However, after careful review, I am not convinced that there has been any change. There is question of an area of insignificant scar near the apex. There is no ischemia.  LV Ejection Fraction: 58%. LV Wall Motion: Normal Wall Motion   Echo shows: Left ventricle: The cavity size was normal. There was mild focal basal hypertrophy of the septum. Systolic function was normal. The estimated ejection fraction was in the range of 55% to 60%. Probable mild hypokinesis of the basal-midinferior myocardium. Left ventricular diastolic function parameters were normal. - Aortic valve: There was trivial regurgitation.  Aug. 3, 2016:  Doing well. No CP.  We added Imdur at his last visit and is feeling much better.   Has DOE still His CP and dyspnea are better on the Imdur   Jan. 30, 2017:  Doing well.  Occasional CP .  Takes NTG on occasion .  Perhaps once a month   Aug. 29, 2017:  Benjamin Harrison had PCI ( angiosculp cutting balloon ) of his mid and distal right coronary artery on 12/06/2015. Breathing better.   Has taken 1-2 NTG .   Staying active Works out at [employer] 3 days a week.    Gets tired quicker than he wants to . Also wants to  decrease his Nexium Advised Pepcid complete  Feb. 13, 2018:  Benjamin Harrison was hospitalized with gastroenteritis with SIRS in January. He has recovered nicely. He was found to have a small 3.5 cm abdominal aortic aneurysm incidentally.  Doing OK from a cardiac standpoint. Has some occasional chest discomfort .   Does not worsen with exertion .   He thinks it may be gas.    04/27/2017: Benjamin Harrison is seen today for follow-up of his coronary artery disease, hypertension, hyperlipidemia.  C/o right leg pain and burning. Feels cold .  Worse with exertion, better with rest. Has been there for 3 months  Hs some chest pain .   Rest and exertion . Takes NTG Has some DOE  Nov. 1, 2019: Has been diagnosed with prostate cancer  PSA is 37.  Has metastised to some lymph nodes and ribs.  Will be getting Lupron injections and XRT  No CP or dyspnea  Has mild CP if he takes it easy .   Past Medical History:  Diagnosis Date  . AAA (abdominal aortic aneurysm) ([place])   . CHF (congestive heart failure) ([place])   . Coronary artery disease    a. CABG 2001 with early failure of VG-RCA. b. Inf MI after Lexiscan 01/28/2009 s/p RCA stent. c. Stent to dRCA 02/24/09. d. DES to [place]  2012. e. NSTEMI s/p angioplasty for ISR of RCA 09/2011. f. 05/2012 Cath: LM 20-30p, LAD 100p, LCX min irregs, RCA patent prox stent, 45m ISR, VG->Diag nl w/ dzs in D1 prox to graft, VG->RCA 100, VG->OM nl, LIMA->LAD nl, Nl EF->Med Rx; g. 09/2014 low-risk MV.  Marland Kitchen Dyspnea   . GERD (gastroesophageal reflux disease)   . History of echocardiogram    a. 09/2014 Echo: EF 55-60%, mild basal-midinferior HK, triv AI.  Marland Kitchen Hyperlipidemia   . Hypertension   . Hypertensive heart disease   . Hypothyroidism   . Myocardial infarction ([place])   . Osteoarthritis    a.End-stage osteoarthritis, right knee, medial compartment  . Personal history of tobacco use   . Prostate cancer ([place])   . Skin cancer    melanoma  . Type II diabetes mellitus ([place])     Past  Surgical History:  Procedure Laterality Date  . CARDIAC CATHETERIZATION    . CARDIAC CATHETERIZATION N/A 12/06/2015   Procedure: Left Heart Cath and Cors/Grafts Angiography;  Surgeon: Burnell Blanks, MD;  Location: [facility];  Service: Cardiovascular;  Laterality: N/A;  . CARDIAC CATHETERIZATION N/A 12/06/2015   Procedure: Coronary Balloon Angioplasty;  Surgeon: Burnell Blanks, MD;  Location: [facility];  Service: Cardiovascular;  Laterality: N/A;  . CATARACT EXTRACTION    . CORONARY ARTERY BYPASS GRAFT    . EYE SURGERY    . HERNIA REPAIR  1985  . INTRAVASCULAR ULTRASOUND/IVUS N/A 10/15/2017   Procedure: Intravascular Ultrasound/IVUS;  Surgeon: Belva Crome, MD;  Location: [facility];  Service: Cardiovascular;  Laterality: N/A;  . KNEE ARTHROSCOPY  1999 and 2004  . LEFT HEART CATH AND CORS/GRAFTS ANGIOGRAPHY N/A 10/15/2017   Procedure: LEFT HEART CATH AND CORS/GRAFTS ANGIOGRAPHY;  Surgeon: Belva Crome, MD;  Location: [facility];  Service: Cardiovascular;  Laterality: N/A;  . LEFT HEART CATHETERIZATION WITH CORONARY ANGIOGRAM N/A 10/09/2011   Procedure: LEFT HEART CATHETERIZATION WITH CORONARY ANGIOGRAM;  Surgeon: Peter M Martinique, MD;  Location: [facility] C[facility];  Service: Cardiovascular;  Laterality: N/A;  . LEFT HEART CATHETERIZATION WITH CORONARY/GRAFT ANGIOGRAM N/A 06/06/2012   Procedure: LEFT HEART CATHETERIZATION WITH Beatrix Fetters;  Surgeon: Peter M Martinique, MD;  Location: [facility] CATH [facility];  Service: Cardiovascular;  Laterality: N/A;     Current Outpatient Medications  Medication Sig Dispense Refill  . acetaminophen (TYLENOL) 500 MG tablet Take 1,000 mg by mouth as needed for mild pain.    Marland Kitchen aspirin 81 MG tablet Take 1 tablet (81 mg total) by mouth daily.    Marland Kitchen atorvastatin (LIPITOR) 40 MG tablet TAKE 1 TABLET BY MOUTH  DAILY 90 tablet 3  . clopidogrel (PLAVIX) 75 MG tablet TAKE 1 TABLET BY MOUTH  DAILY 90 tablet 3  . fluocinonide (LIDEX)  0.05 % external solution Apply 1 mL topically 2 (two) times daily.  3  . glimepiride (AMARYL) 4 MG tablet Take 4 mg by mouth 2 (two) times daily.     . hydrochlorothiazide (HYDRODIURIL) 25 MG tablet Take 1 tablet (25 mg total) by mouth daily. 90 tablet 3  . isosorbide mononitrate (IMDUR) 120 MG 24 hr tablet Take 1 tablet (120 mg total) by mouth daily. 90 tablet 3  . KLOR-CON 10 10 MEQ tablet TAKE 1 TABLET BY MOUTH EVERY DAY 90 tablet 2  . levothyroxine (SYNTHROID, LEVOTHROID) 100 MCG tablet Take 100 mcg by mouth daily before breakfast.     . lisinopril (PRINIVIL,ZESTRIL) 10 MG tablet Take 1 tablet (10 mg total) by mouth daily. 90 tablet 2  . metFORMIN (GLUCOPHAGE) 1000 MG tablet Take 1,000 mg by mouth 2 (two) times daily with a meal. Reports taking 1000 mg in the morning and 500 mg in the evening.    . metoprolol tartrate (LOPRESSOR) 25 MG tablet TAKE 1 TABLET BY MOUTH TWO  TIMES DAILY 180 tablet 3  . ondansetron (ZOFRAN ODT) 4 MG disintegrating tablet Take 1 tablet (4 mg total) by mouth every 8 (eight) hours as needed. 10 tablet 0  . pantoprazole (PROTONIX) 40 MG tablet Take 1 tablet (40 mg total) by mouth 2 (two) times daily. 60 tablet 0  . phenazopyridine (PYRIDIUM) 200 MG tablet Take 1 tablet (200 mg total) by mouth 3 (three) times daily. 6 tablet 0  . Polyethyl Glycol-Propyl Glycol (SYSTANE FREE OP) Place 1 drop into both eyes 3 (three) times daily as needed (for dry eyes.).      No current facility-administered medications for this visit.     Allergies:   Codeine    Social History:  The patient  reports that he quit smoking about 42 years ago. His smoking use included cigarettes. He has a 20.00 pack-year smoking history. He has never used smokeless tobacco. He reports that he does not drink alcohol or use drugs.   Family History:  The patient's family history includes Heart disease in his brother and brother; Hypertension in his mother; Lung cancer in his sister and son; Pneumonia in his  brother; Prostate cancer in  his brother, other, and son; Throat cancer in his sister; Unexplained death in his brother, brother, and mother.    ROS:  Please see the history of present illness.     Physical Exam: Blood pressure 118/70, pulse (!) 54, height 5\' 5"  (1.651 m), weight 165 lb (74.8 kg), SpO2 98 %.  GEN:  Well nourished, well developed in no acute distress HEENT: Normal NECK: No JVD; No carotid bruits LYMPHATICS: No lymphadenopathy CARDIAC: RRR  , soft systolic murmur  RESPIRATORY:  Clear to auscultation without rales, wheezing or rhonchi  ABDOMEN: Soft, non-tender, non-distended MUSCULOSKELETAL:  No edema; No deformity  SKIN: Warm and dry NEUROLOGIC:  Alert and oriented x 3   EKG:  EKG is not ordered today.  Recent Labs: 10/11/2017: NT-Pro BNP 210 01/14/2018: ALT 15 02/07/2018: BUN 18; Creatinine, Ser 1.37; Hemoglobin 12.6; Platelets 231; Potassium 4.6; Sodium 138    Lipid Panel    Component Value Date/Time   CHOL 114 04/27/2017 0851   TRIG 168 (H) 04/27/2017 0851   HDL 26 (L) 04/27/2017 0851   CHOLHDL 4.4 04/27/2017 0851   CHOLHDL 4.1 03/22/2016 0959   VLDL 23 03/22/2016 0959   LDLCALC 54 04/27/2017 0851      Wt Readings from Last 3 Encounters:  05/31/18 165 lb (74.8 kg)  05/30/18 164 lb 9.6 oz (74.7 kg)  02/07/18 169 lb (76.7 kg)      Other studies Reviewed: Additional studies/ records that were reviewed today include: . Review of the above records demonstrates:    ASSESSMENT AND PLAN:  1. Coronary artery disease:  He has an occluded graft to the right coronary artery and the native right coronary artery.  He has occasional episodes of angina and takes nitroglycerin. Has minimal CP with exertion , Takes the IMdur   2. Hypertension - BP has been well controlled.   3. Hypothyroidism - followed by his primary medical doctor  4. Hyperlipidemia - check labs today.  5. Diabetes mellitus   Current medicines are reviewed at length with the  patient today.  The patient does not have concerns regarding medicines.  The following changes have been made:  See above.    Disposition:   FU with me in 6 months    Signed, Benjamin Moores, MD  05/31/2018 10:30 AM    [facility] [place], [place], [place]  [address] Phone: [phone]; Fax: [phone]

## 2018-06-04 DIAGNOSIS — C61 Malignant neoplasm of prostate: Secondary | ICD-10-CM | POA: Insufficient documentation

## 2018-06-06 ENCOUNTER — Encounter (HOSPITAL_COMMUNITY): Payer: Self-pay | Admitting: Emergency Medicine

## 2018-06-06 ENCOUNTER — Inpatient Hospital Stay (HOSPITAL_COMMUNITY)
Admission: EM | Admit: 2018-06-06 | Discharge: 2018-06-08 | DRG: 683 | Disposition: A | Discharge status: Home / Self Care | Payer: Medicare Other | Attending: Internal Medicine | Admitting: Internal Medicine

## 2018-06-06 ENCOUNTER — Observation Stay (HOSPITAL_COMMUNITY): Payer: Medicare Other

## 2018-06-06 DIAGNOSIS — Z7984 Long term (current) use of oral hypoglycemic drugs: Secondary | ICD-10-CM

## 2018-06-06 DIAGNOSIS — I714 Abdominal aortic aneurysm, without rupture: Secondary | ICD-10-CM | POA: Diagnosis present

## 2018-06-06 DIAGNOSIS — Z7902 Long term (current) use of antithrombotics/antiplatelets: Secondary | ICD-10-CM

## 2018-06-06 DIAGNOSIS — N179 Acute kidney failure, unspecified: Secondary | ICD-10-CM

## 2018-06-06 DIAGNOSIS — K219 Gastro-esophageal reflux disease without esophagitis: Secondary | ICD-10-CM | POA: Diagnosis present

## 2018-06-06 DIAGNOSIS — Z79890 Hormone replacement therapy (postmenopausal): Secondary | ICD-10-CM

## 2018-06-06 DIAGNOSIS — Z9114 Patient's other noncompliance with medication regimen: Secondary | ICD-10-CM

## 2018-06-06 DIAGNOSIS — I252 Old myocardial infarction: Secondary | ICD-10-CM

## 2018-06-06 DIAGNOSIS — Z951 Presence of aortocoronary bypass graft: Secondary | ICD-10-CM

## 2018-06-06 DIAGNOSIS — E162 Hypoglycemia, unspecified: Secondary | ICD-10-CM | POA: Diagnosis present

## 2018-06-06 DIAGNOSIS — R112 Nausea with vomiting, unspecified: Secondary | ICD-10-CM

## 2018-06-06 DIAGNOSIS — M1711 Unilateral primary osteoarthritis, right knee: Secondary | ICD-10-CM | POA: Diagnosis present

## 2018-06-06 DIAGNOSIS — Z85820 Personal history of malignant melanoma of skin: Secondary | ICD-10-CM

## 2018-06-06 DIAGNOSIS — Z8042 Family history of malignant neoplasm of prostate: Secondary | ICD-10-CM

## 2018-06-06 DIAGNOSIS — E1122 Type 2 diabetes mellitus with diabetic chronic kidney disease: Secondary | ICD-10-CM | POA: Diagnosis present

## 2018-06-06 DIAGNOSIS — Z885 Allergy status to narcotic agent status: Secondary | ICD-10-CM

## 2018-06-06 DIAGNOSIS — D631 Anemia in chronic kidney disease: Secondary | ICD-10-CM | POA: Diagnosis present

## 2018-06-06 DIAGNOSIS — R197 Diarrhea, unspecified: Secondary | ICD-10-CM

## 2018-06-06 DIAGNOSIS — I2510 Atherosclerotic heart disease of native coronary artery without angina pectoris: Secondary | ICD-10-CM | POA: Diagnosis present

## 2018-06-06 DIAGNOSIS — E876 Hypokalemia: Secondary | ICD-10-CM | POA: Diagnosis present

## 2018-06-06 DIAGNOSIS — Z801 Family history of malignant neoplasm of trachea, bronchus and lung: Secondary | ICD-10-CM

## 2018-06-06 DIAGNOSIS — I10 Essential (primary) hypertension: Secondary | ICD-10-CM | POA: Diagnosis present

## 2018-06-06 DIAGNOSIS — E860 Dehydration: Secondary | ICD-10-CM | POA: Diagnosis present

## 2018-06-06 DIAGNOSIS — Z87891 Personal history of nicotine dependence: Secondary | ICD-10-CM

## 2018-06-06 DIAGNOSIS — Z8546 Personal history of malignant neoplasm of prostate: Secondary | ICD-10-CM

## 2018-06-06 DIAGNOSIS — Z79899 Other long term (current) drug therapy: Secondary | ICD-10-CM

## 2018-06-06 DIAGNOSIS — E785 Hyperlipidemia, unspecified: Secondary | ICD-10-CM | POA: Diagnosis present

## 2018-06-06 DIAGNOSIS — N183 Chronic kidney disease, stage 3 unspecified: Secondary | ICD-10-CM

## 2018-06-06 DIAGNOSIS — I130 Hypertensive heart and chronic kidney disease with heart failure and stage 1 through stage 4 chronic kidney disease, or unspecified chronic kidney disease: Secondary | ICD-10-CM | POA: Diagnosis present

## 2018-06-06 DIAGNOSIS — Z8249 Family history of ischemic heart disease and other diseases of the circulatory system: Secondary | ICD-10-CM

## 2018-06-06 DIAGNOSIS — N400 Enlarged prostate without lower urinary tract symptoms: Secondary | ICD-10-CM | POA: Diagnosis present

## 2018-06-06 DIAGNOSIS — D649 Anemia, unspecified: Secondary | ICD-10-CM

## 2018-06-06 DIAGNOSIS — Z808 Family history of malignant neoplasm of other organs or systems: Secondary | ICD-10-CM

## 2018-06-06 DIAGNOSIS — I509 Heart failure, unspecified: Secondary | ICD-10-CM | POA: Diagnosis present

## 2018-06-06 DIAGNOSIS — Z7982 Long term (current) use of aspirin: Secondary | ICD-10-CM

## 2018-06-06 DIAGNOSIS — E039 Hypothyroidism, unspecified: Secondary | ICD-10-CM | POA: Diagnosis present

## 2018-06-06 DIAGNOSIS — A084 Viral intestinal infection, unspecified: Secondary | ICD-10-CM

## 2018-06-06 DIAGNOSIS — E11649 Type 2 diabetes mellitus with hypoglycemia without coma: Secondary | ICD-10-CM | POA: Diagnosis present

## 2018-06-06 LAB — CBC WITH DIFFERENTIAL/PLATELET
ABS IMMATURE GRANULOCYTES: 0.02 10*3/uL (ref 0.00–0.07)
BASOS PCT: 0 %
Basophils Absolute: 0 10*3/uL (ref 0.0–0.1)
Eosinophils Absolute: 0.1 10*3/uL (ref 0.0–0.5)
Eosinophils Relative: 2 %
HCT: 40 % (ref 39.0–52.0)
Hemoglobin: 12.7 g/dL — ABNORMAL LOW (ref 13.0–17.0)
IMMATURE GRANULOCYTES: 0 %
Lymphocytes Relative: 14 %
Lymphs Abs: 1 10*3/uL (ref 0.7–4.0)
MCH: 25.6 pg — ABNORMAL LOW (ref 26.0–34.0)
MCHC: 31.8 g/dL (ref 30.0–36.0)
MCV: 80.5 fL (ref 80.0–100.0)
Monocytes Absolute: 1.1 10*3/uL — ABNORMAL HIGH (ref 0.1–1.0)
Monocytes Relative: 14 %
NEUTROS ABS: 5.3 10*3/uL (ref 1.7–7.7)
NEUTROS PCT: 70 %
PLATELETS: 173 10*3/uL (ref 150–400)
RBC: 4.97 MIL/uL (ref 4.22–5.81)
RDW: 15.4 % (ref 11.5–15.5)
WBC: 7.5 10*3/uL (ref 4.0–10.5)
nRBC: 0 % (ref 0.0–0.2)

## 2018-06-06 LAB — MAGNESIUM: Magnesium: 1.5 mg/dL — ABNORMAL LOW (ref 1.7–2.4)

## 2018-06-06 LAB — COMPREHENSIVE METABOLIC PANEL
ALBUMIN: 3 g/dL — AB (ref 3.5–5.0)
ALT: 18 U/L (ref 0–44)
AST: 15 U/L (ref 15–41)
Alkaline Phosphatase: 48 U/L (ref 38–126)
Anion gap: 10 (ref 5–15)
BUN: 56 mg/dL — AB (ref 8–23)
CALCIUM: 8.2 mg/dL — AB (ref 8.9–10.3)
CHLORIDE: 106 mmol/L (ref 98–111)
CO2: 21 mmol/L — ABNORMAL LOW (ref 22–32)
CREATININE: 1.96 mg/dL — AB (ref 0.61–1.24)
GFR calc non Af Amer: 30 mL/min — ABNORMAL LOW (ref >60)
GFR, EST AFRICAN AMERICAN: 35 mL/min — AB (ref >60)
Glucose, Bld: 47 mg/dL — ABNORMAL LOW (ref 70–99)
Potassium: 3.2 mmol/L — ABNORMAL LOW (ref 3.5–5.1)
Sodium: 137 mmol/L (ref 135–145)
Total Bilirubin: 0.7 mg/dL (ref 0.3–1.2)
Total Protein: 6.2 g/dL — ABNORMAL LOW (ref 6.5–8.1)

## 2018-06-06 LAB — CBG MONITORING, ED
GLUCOSE-CAPILLARY: 35 mg/dL — AB (ref 70–99)
Glucose-Capillary: 76 mg/dL (ref 70–99)

## 2018-06-06 LAB — LIPASE, BLOOD: LIPASE: 22 U/L (ref 11–51)

## 2018-06-06 MED ORDER — ATORVASTATIN CALCIUM 40 MG PO TABS
40.0000 mg | ORAL_TABLET | Freq: Every day | ORAL | Status: DC
  Administered 2018-06-07 – 2018-06-08 (×2): 40 mg via ORAL
  Filled 2018-06-06 (×2): qty 1

## 2018-06-06 MED ORDER — ASPIRIN EC 81 MG PO TBEC
81.0000 mg | DELAYED_RELEASE_TABLET | Freq: Every day | ORAL | Status: DC
  Administered 2018-06-07 – 2018-06-08 (×2): 81 mg via ORAL
  Filled 2018-06-06 (×2): qty 1

## 2018-06-06 MED ORDER — DEXTROSE 50 % IV SOLN
1.0000 | Freq: Once | INTRAVENOUS | Status: AC
  Administered 2018-06-06: 50 mL via INTRAVENOUS
  Filled 2018-06-06: qty 50

## 2018-06-06 MED ORDER — LEVOTHYROXINE SODIUM 100 MCG PO TABS
100.0000 ug | ORAL_TABLET | Freq: Every day | ORAL | Status: DC
  Administered 2018-06-07 – 2018-06-08 (×2): 100 ug via ORAL
  Filled 2018-06-06 (×2): qty 1

## 2018-06-06 MED ORDER — SODIUM CHLORIDE 0.9 % IV SOLN
INTRAVENOUS | Status: DC
  Administered 2018-06-07: 01:00:00 via INTRAVENOUS

## 2018-06-06 MED ORDER — SODIUM CHLORIDE 0.9 % IV BOLUS
500.0000 mL | Freq: Once | INTRAVENOUS | Status: AC
  Administered 2018-06-06: 500 mL via INTRAVENOUS

## 2018-06-06 MED ORDER — DEXTROSE 50 % IV SOLN
1.0000 | INTRAVENOUS | Status: DC | PRN
  Administered 2018-06-07 (×4): 50 mL via INTRAVENOUS
  Filled 2018-06-06 (×2): qty 50

## 2018-06-06 MED ORDER — ONDANSETRON HCL 4 MG/2ML IJ SOLN
4.0000 mg | Freq: Once | INTRAMUSCULAR | Status: AC
  Administered 2018-06-06: 4 mg via INTRAVENOUS
  Filled 2018-06-06: qty 2

## 2018-06-06 MED ORDER — SODIUM CHLORIDE 0.9 % IV SOLN
Freq: Once | INTRAVENOUS | Status: AC
  Administered 2018-06-06: 22:00:00 via INTRAVENOUS

## 2018-06-06 MED ORDER — ISOSORBIDE MONONITRATE ER 60 MG PO TB24
120.0000 mg | ORAL_TABLET | Freq: Every day | ORAL | Status: DC
  Administered 2018-06-07 – 2018-06-08 (×2): 120 mg via ORAL
  Filled 2018-06-06 (×2): qty 2

## 2018-06-06 MED ORDER — PANTOPRAZOLE SODIUM 40 MG PO TBEC
40.0000 mg | DELAYED_RELEASE_TABLET | Freq: Two times a day (BID) | ORAL | Status: DC
  Administered 2018-06-07 – 2018-06-08 (×4): 40 mg via ORAL
  Filled 2018-06-06 (×4): qty 1

## 2018-06-06 MED ORDER — IOHEXOL 300 MG/ML  SOLN
30.0000 mL | Freq: Once | INTRAMUSCULAR | Status: AC | PRN
  Administered 2018-06-06: 30 mL via ORAL

## 2018-06-06 MED ORDER — METOPROLOL TARTRATE 25 MG PO TABS
12.5000 mg | ORAL_TABLET | Freq: Two times a day (BID) | ORAL | Status: DC
  Administered 2018-06-07 – 2018-06-08 (×4): 12.5 mg via ORAL
  Filled 2018-06-06 (×4): qty 1

## 2018-06-06 MED ORDER — POTASSIUM CHLORIDE CRYS ER 20 MEQ PO TBCR
40.0000 meq | EXTENDED_RELEASE_TABLET | Freq: Once | ORAL | Status: AC
  Administered 2018-06-07: 40 meq via ORAL
  Filled 2018-06-06: qty 2

## 2018-06-06 MED ORDER — CLOPIDOGREL BISULFATE 75 MG PO TABS
75.0000 mg | ORAL_TABLET | Freq: Every day | ORAL | Status: DC
  Administered 2018-06-07 – 2018-06-08 (×2): 75 mg via ORAL
  Filled 2018-06-06 (×2): qty 1

## 2018-06-06 MED ORDER — ENOXAPARIN SODIUM 30 MG/0.3ML ~~LOC~~ SOLN
30.0000 mg | Freq: Every day | SUBCUTANEOUS | Status: DC
  Administered 2018-06-07: 30 mg via SUBCUTANEOUS
  Filled 2018-06-06: qty 0.3

## 2018-06-06 MED ORDER — ONDANSETRON HCL 4 MG/2ML IJ SOLN: 4.0000 mg | Freq: Four times a day (QID) | INTRAMUSCULAR | Status: DC | PRN

## 2018-06-06 MED ORDER — ACETAMINOPHEN 325 MG PO TABS
650.0000 mg | ORAL_TABLET | Freq: Four times a day (QID) | ORAL | Status: DC | PRN
  Administered 2018-06-07 (×2): 650 mg via ORAL
  Filled 2018-06-06 (×2): qty 2

## 2018-06-06 MED ORDER — TAMSULOSIN HCL 0.4 MG PO CAPS
0.4000 mg | ORAL_CAPSULE | Freq: Every day | ORAL | Status: DC
  Administered 2018-06-07 – 2018-06-08 (×2): 0.4 mg via ORAL
  Filled 2018-06-06 (×2): qty 1

## 2018-06-06 NOTE — ED Notes (Signed)
Pt in CT.

## 2018-06-06 NOTE — ED Triage Notes (Signed)
Pt comes to ed via ems, NVD, x 3 days, hx of type 2 diabetes. Pt was at providers offices and received 1800 ns in at office.  Pt cbg 10. Glucose tabs given, ems arrives cbg 45, 22 Rac, d10 25 grams, 1833. cbg 139.  V/s 134/60 hr 78, rr16, spo2 99.  Alert x4.  Comes from home, wife in room

## 2018-06-06 NOTE — H&P (Addendum)
History and Physical    MYRTLE BARNHARD SEG:[id number] DOB: 10/25/1934 DOA: 06/06/2018  PCP: Leonard Downing, MD Patient coming from: Home  Chief Complaint: Low blood glucose  HPI: Benjamin Harrison is a 83 y.o. male with medical history significant of coronary artery disease status post CABG and PCI, CHF, hypertension, hyperlipidemia, hypothyroidism, type 2 diabetes presenting to the hospital for evaluation of hypoglycemia.  Patient states he has been vomiting and having diarrhea for the past 2 days.  He has been passing a lot of gas and his abdomen appears distended.  Reports having generalized abdominal pain which is sharp, intermittent.  States he has not taken any of his diabetes medications including metformin and glimepiride for the past 2 days.  He has not had any further episodes of vomiting today but continues to have diarrhea.  States his blood glucose was 44 at home and 10 when checked at his doctor's office.  ED Course: Vitals stable.  Blood glucose 10 earlier today.  Patient was given oral glucose and IV glucose in addition to 2 L IV fluid via EMS.  CBG improved to 139.  Blood glucose 47 on arrival to the ED.  Subsequently dropped down to 35.  Labs showing no leukocytosis.  Lipase normal.  Creatinine 1.9, was 1.1 six days ago.  CT abdomen pelvis pending at this time.  TRH paged to admit.  Review of Systems: As per HPI otherwise 10 point review of systems negative.  Past Medical History:  Diagnosis Date  . AAA (abdominal aortic aneurysm) ([place])   . CHF (congestive heart failure) ([place])   . Coronary artery disease    a. CABG 2001 with early failure of VG-RCA. b. Inf MI after Lexiscan 01/28/2009 s/p RCA stent. c. Stent to dRCA 02/24/09. d. DES to [place]  2012. e. NSTEMI s/p angioplasty for ISR of RCA 09/2011. f. 05/2012 Cath: LM 20-30p, LAD 100p, LCX min irregs, RCA patent prox stent, 48m ISR, VG->Diag nl w/ dzs in D1 prox to graft, VG->RCA 100, VG->OM nl, LIMA->LAD nl, Nl  EF->Med Rx; g. 09/2014 low-risk MV.  Marland Kitchen Dyspnea   . GERD (gastroesophageal reflux disease)   . History of echocardiogram    a. 09/2014 Echo: EF 55-60%, mild basal-midinferior HK, triv AI.  Marland Kitchen Hyperlipidemia   . Hypertension   . Hypertensive heart disease   . Hypothyroidism   . Myocardial infarction ([place])   . Osteoarthritis    a.End-stage osteoarthritis, right knee, medial compartment  . Personal history of tobacco use   . Prostate cancer ([place])   . Skin cancer    melanoma  . Type II diabetes mellitus ([place])     Past Surgical History:  Procedure Laterality Date  . CARDIAC CATHETERIZATION    . CARDIAC CATHETERIZATION N/A 12/06/2015   Procedure: Left Heart Cath and Cors/Grafts Angiography;  Surgeon: Burnell Blanks, MD;  Location: [place] [facility];  Service: Cardiovascular;  Laterality: N/A;  . CARDIAC CATHETERIZATION N/A 12/06/2015   Procedure: Coronary Balloon Angioplasty;  Surgeon: Burnell Blanks, MD;  Location: [place] [facility];  Service: Cardiovascular;  Laterality: N/A;  . CATARACT EXTRACTION    . CORONARY ARTERY BYPASS GRAFT    . EYE SURGERY    . HERNIA REPAIR  1985  . INTRAVASCULAR ULTRASOUND/IVUS N/A 10/15/2017   Procedure: Intravascular Ultrasound/IVUS;  Surgeon: Belva Crome, MD;  Location: [place] [facility];  Service: Cardiovascular;  Laterality: N/A;  . KNEE ARTHROSCOPY  1999 and 2004  .  LEFT HEART CATH AND CORS/GRAFTS ANGIOGRAPHY N/A 10/15/2017   Procedure: LEFT HEART CATH AND CORS/GRAFTS ANGIOGRAPHY;  Surgeon: Belva Crome, MD;  Location: [facility];  Service: Cardiovascular;  Laterality: N/A;  . LEFT HEART CATHETERIZATION WITH CORONARY ANGIOGRAM N/A 10/09/2011   Procedure: LEFT HEART CATHETERIZATION WITH CORONARY ANGIOGRAM;  Surgeon: Peter M Martinique, MD;  Location[facility];  Service: Cardiovascular;  Laterality: N/A;  . LEFT HEART CATHETERIZATION WITH CORONARY/GRAFT ANGIOGRAM N/A 06/06/2012   Procedure: LEFT HEART CATHETERIZATION WITH  Beatrix Fetters;  Surgeon: Peter M Martinique, MD;  Location: [facility] CATH LAB;  Service: Cardiovascular;  Laterality: N/A;     reports that he quit smoking about 42 years ago. His smoking use included cigarettes. He has a 20.00 pack-year smoking history. He has never used smokeless tobacco. He reports that he does not drink alcohol or use drugs.  Allergies  Allergen Reactions  . Codeine Nausea Only    Pt reports this happens off and on    Family History  Problem Relation Age of Onset  . Hypertension Mother   . Unexplained death Mother   . Throat cancer Sister   . Lung cancer Sister   . Heart disease Brother   . Pneumonia Brother   . Heart disease Brother   . Prostate cancer Brother   . Unexplained death Brother        at birth  . Unexplained death Brother   . Prostate cancer Son   . Lung cancer Son   . Prostate cancer Other     Prior to Admission medications   Medication Sig Start Date End Date Taking? Authorizing Provider  acetaminophen (TYLENOL) 500 MG tablet Take 1,000 mg by mouth as needed for mild pain.   Yes [provider]  aspirin 81 MG tablet Take 1 tablet (81 mg total) by mouth daily. 10/10/11  Yes [name], NP  atorvastatin (LIPITOR) 40 MG tablet TAKE 1 TABLET BY MOUTH  DAILY 05/29/17  Yes [name], [name], MD  clopidogrel (PLAVIX) 75 MG tablet TAKE 1 TABLET BY MOUTH  DAILY 05/29/17  Yes [name], [name], MD  glimepiride (AMARYL) 4 MG tablet Take 4 mg by mouth 2 (two) times daily.    Yes [provider]  hydrochlorothiazide (HYDRODIURIL) 25 MG tablet Take 1 tablet (25 mg total) by mouth daily. 12/19/13  Yes [name], [name], MD  isosorbide mononitrate (IMDUR) 120 MG 24 hr tablet Take 1 tablet (120 mg total) by mouth daily. 10/16/17  Yes [name], [name], PA-C  KLOR-CON 10 10 MEQ tablet TAKE 1 TABLET BY MOUTH EVERY DAY 02/06/18  Yes [name], [name], MD  levothyroxine (SYNTHROID, LEVOTHROID) 100 MCG tablet Take 100 mcg by mouth daily  before breakfast.    Yes [provider]  lisinopril (PRINIVIL,ZESTRIL) 10 MG tablet Take 1 tablet (10 mg total) by mouth daily. 10/11/17 07/08/18 Yes Bhagat, Bhavinkumar, PA  metFORMIN (GLUCOPHAGE) 1000 MG tablet Take 1,000 mg by mouth 2 (two) times daily with a meal. Reports taking 1000 mg in the morning and 500 mg in the evening.   Yes [provider]  metoprolol tartrate (LOPRESSOR) 25 MG tablet TAKE 1 TABLET BY MOUTH TWO  TIMES DAILY Patient taking differently: Take 12.5 mg by mouth 2 (two) times daily.  05/29/17  Yes [name], [name], MD  ondansetron (ZOFRAN ODT) 4 MG disintegrating tablet Take 1 tablet (4 mg total) by mouth every 8 (eight) hours as needed. Patient taking differently: Take 4 mg  by mouth every 8 (eight) hours as needed for nausea or vomiting.  02/07/18  Yes [name], MD  pantoprazole (PROTONIX) 40 MG tablet Take 1 tablet (40 mg total) by mouth 2 (two) times daily. 01/14/18  Yes Neva Seat, MD  phenazopyridine (PYRIDIUM) 200 MG tablet Take 1 tablet (200 mg total) by mouth 3 (three) times daily. 02/07/18  Yes [name], MD  Polyethyl Glycol-Propyl Glycol (SYSTANE FREE OP) Place 1 drop into both eyes 3 (three) times daily as needed (for dry eyes.).    Yes [provider]  tamsulosin (FLOMAX) 0.4 MG CAPS capsule Take 0.4 mg by mouth daily. 05/26/18  Yes [provider]  fluocinonide (LIDEX) 0.05 % external solution Apply 1 mL topically 2 (two) times daily as needed (dryness and itching).  12/24/17   [provider]    Physical Exam: Vitals:   06/06/18 2230 06/06/18 2253 06/06/18 2300 06/07/18 0003  BP: (!) 141/69 (!) 141/69 (!) 143/79 116/75  Pulse: 87 88 75 88  Resp:  16  18  Temp:    98.3 F (36.8 C)  TempSrc:    Oral  SpO2: 97% 99% 97% 97%  Weight:    74.3 kg  Height:    5\' 6"  (1.676 m)    Physical Exam  Constitutional: He is oriented to person, place, and time. He appears well-developed and well-nourished.  No distress.  HENT:  Head: Normocephalic.  Dry mucous membranes  Eyes: Right eye exhibits no discharge. Left eye exhibits no discharge.  Neck: Neck supple. No tracheal deviation present.  Cardiovascular: Normal rate, regular rhythm and intact distal pulses.  Pulmonary/Chest: Effort normal and breath sounds normal. No respiratory distress. He has no wheezes. He has no rales.  Abdominal: He exhibits distension. There is tenderness. There is guarding. There is no rebound.  Hyperactive bowel sounds Left lower quadrant tender to palpation  Musculoskeletal: He exhibits no edema.  Neurological: He is alert and oriented to person, place, and time.  Skin: Skin is warm and dry. He is not diaphoretic.  Psychiatric: His behavior is normal.     Labs on Admission: I have personally reviewed following labs and imaging studies  CBC: Recent Labs  Lab 06/06/18 2029  WBC 7.5  NEUTROABS 5.3  HGB 12.7*  HCT 40.0  MCV 80.5  PLT 045   Basic Metabolic Panel: Recent Labs  Lab 05/31/18 1100 06/06/18 2029  NA 140 137  K 4.4 3.2*  CL 100 106  CO2 25 21*  GLUCOSE 146* 47*  BUN 18 56*  CREATININE 1.19 1.96*  CALCIUM 9.1 8.2*  MG  --  1.5*   GFR: Estimated Creatinine Clearance: 26.7 mL/min (A) (by C-G formula based on SCr of 1.96 mg/dL (H)). Liver Function Tests: Recent Labs  Lab 05/31/18 1100 06/06/18 2029  AST 20 15  ALT 22 18  ALKPHOS 97 48  BILITOT 0.8 0.7  PROT 6.5 6.2*  ALBUMIN 3.6 3.0*   Recent Labs  Lab 06/06/18 2029  LIPASE 22   No results for input(s): AMMONIA in the last 168 hours. Coagulation Profile: No results for input(s): INR, PROTIME in the last 168 hours. Cardiac Enzymes: No results for input(s): CKTOTAL, CKMB, CKMBINDEX, TROPONINI in the last 168 hours. BNP (last 3 results) Recent Labs    10/11/17 1000  PROBNP 210   HbA1C: No results for input(s): HGBA1C in the last 72 hours. CBG: Recent Labs  Lab 06/06/18 2055 06/06/18 2216 06/07/18 0009  06/07/18 0029  GLUCAP 35* 76  30* 141*   Lipid Profile: No results for input(s): CHOL, HDL, LDLCALC, TRIG, CHOLHDL, LDLDIRECT in the last 72 hours. Thyroid Function Tests: No results for input(s): TSH, T4TOTAL, FREET4, T3FREE, THYROIDAB in the last 72 hours. Anemia Panel: No results for input(s): VITAMINB12, FOLATE, FERRITIN, TIBC, IRON, RETICCTPCT in the last 72 hours. Urine analysis:    Component Value Date/Time   COLORURINE YELLOW 02/07/2018 1907   APPEARANCEUR CLEAR 02/07/2018 1907   LABSPEC 1.017 02/07/2018 1907   PHURINE 5.0 02/07/2018 1907   GLUCOSEU NEGATIVE 02/07/2018 1907   HGBUR NEGATIVE 02/07/2018 [place] NEGATIVE 02/07/2018 [place] NEGATIVE 02/07/2018 1907   PROTEINUR NEGATIVE 02/07/2018 1907   NITRITE NEGATIVE 02/07/2018 1907   LEUKOCYTESUR NEGATIVE 02/07/2018 1907    Radiological Exams on Admission: Ct Abdomen Pelvis Wo Contrast  Result Date: 06/06/2018 CLINICAL DATA:  Nausea, vomiting, and diarrhea. Abdominal distension. Personal history of prostate cancer. EXAM: CT ABDOMEN AND PELVIS WITHOUT CONTRAST TECHNIQUE: Multidetector CT imaging of the abdomen and pelvis was performed following the standard protocol without IV contrast. COMPARISON:  None. FINDINGS: Lower chest: The lung bases are clear without focal nodule, mass, or airspace disease. Heart is mildly enlarged. Extensive coronary artery calcifications are present. Calcifications are also present at the aortic valve. There are calcifications in the descending and thoracic aorta. No significant pleural or pericardial effusion is present. Hepatobiliary: No focal liver abnormality is seen. No gallstones, gallbladder wall thickening, or biliary dilatation. Pancreas: Unremarkable. No pancreatic ductal dilatation or surrounding inflammatory changes. Spleen: Normal in size without focal abnormality. Adrenals/Urinary Tract: Adrenal glands are normal bilaterally. There is stranding about both kidneys  without significant dilation. Exophytic cyst of the right kidney is stable from 02/07/18 and 08/08/2014. No other renal lesions are present. The urinary bladder is within normal limits. Stomach/Bowel: The stomach is mildly distended. There is no obstruction or mass lesion. The duodenum is normal. Small bowel is unremarkable. The ascending and transverse colon are within normal limits. Diverticular changes are present in the distal descending and sigmoid colon without focal inflammation. Vascular/Lymphatic: Atherosclerotic calcifications are present in the aorta and branch vessels. Infrarenal abdominal aortic aneurysm measures up to 3.6 cm. This is not changed significantly since 2018. No significant retroperitoneal adenopathy is present. Reproductive: Prostate is unremarkable. Other: Multilevel degenerative changes of the lumbar spine are again noted. No focal lytic or blastic lesions are present. Pelvis is intact. The hips are located and within normal limits. Musculoskeletal: Slight degenerative retrolisthesis is present at L3-4 and L4-5. Levoconvex curvature is centered at the thoracolumbar junction. IMPRESSION: 1. No acute or focal lesion to explain the patient's symptoms. 2. New stranding about both kidneys. 3. Stable exophytic cyst of the right kidney. 4. Degenerative disc disease is most evident at L3-4 and L4-5 Electronically Signed   By: San Morelle M.D.   On: 06/06/2018 23:59    Assessment/Plan Principal Problem:   Hypoglycemia Active Problems:   CAD (coronary artery disease)   Hypertension   Hypothyroidism   Hyperlipidemia   GERD (gastroesophageal reflux disease)   AKI (acute kidney injury) ([facility])   Chronic anemia   CKD (chronic kidney disease) stage 3, GFR 30-59 ml/min ([facility])   Hypokalemia   BPH (benign prostatic hyperplasia)   Viral gastroenteritis  Hypoglycemia -In the setting of home sulfonylurea use.  Although patient has not taken any diabetes medications for the past 2  days.  Continues to be hypoglycemic today. Blood glucose 10 earlier today.  Patient was given oral glucose  and IV glucose in addition to 2 L IV fluid via EMS.  CBG improved to 139.  Blood glucose 47 on arrival to the ED.  Subsequently dropped down to 35.  Most recent blood glucose 76. -CBG monitoring every 2 hours -1 amp D50 for CBG less than 70 -D5 normal saline infusion with potassium @ 100cc/hr  -Hypoglycemia protocol -Hold home metformin and glimepiride  Viral gastroenteritis 2-day history of nausea, vomiting, and diarrhea.  Afebrile and no leukocytosis.  Lipase normal.  LFTs normal.  CT abdomen pelvis negative for acute abnormality. -IV fluid resuscitation -IV Zofran PRN nausea -GI pathogen panel -Loperamide as needed for diarrhea  AKI on CKD 3 Creatinine 1.9, was 1.1 six days ago.  Likely prerenal due to dehydration. -IV fluid resuscitation -Avoid nephrotoxic agents/contrast -BMP in a.m.  Chronic anemia -Stable.  Hemoglobin 12.7, at baseline.  Mild hypokalemia Potassium 3.2 in the setting of nausea, vomiting, and diarrhea. -Replete potassium -Check magnesium level -BMP in a.m.  History of coronary artery disease status post CABG and PCI -Stable.  No chest pain. -Continue home aspirin and Plavix -Continue home Imdur, metoprolol  Hypertension -Systolic in the 625W and diastolic in the 38L at present. -Continue home Imdur, metoprolol  Hyperlipidemia -Continue home Lipitor  Hypothyroidism -Continue home Synthroid  GERD -Continue home PPI  BPH -Continue home Flomax  DVT prophylaxis: Lovenox 30 mg daily. Code Status: Patient wishes to be full code. Family Communication: Wife at bedside updated. Disposition Plan: Anticipate discharge to home in 1 to 2 days. Consults called: None Admission status: Observation   Shela Leff MD [facility] Pager [phone]  If 7PM-7AM, please contact night-coverage [email or web address] Password [email or web address]  06/07/2018,  1:17 AM

## 2018-06-06 NOTE — ED Notes (Signed)
Pt CBG 35. RN notified

## 2018-06-06 NOTE — ED Provider Notes (Signed)
[facility] DEPT Provider Note   CSN: [id number] Arrival date & time: 06/06/18  1908     History   Chief Complaint Chief Complaint  Patient presents with  . Hypoglycemia    HPI Benjamin Harrison is a 83 y.o. male.  Level 5 caveat for urgent need for intervention.  Chief complaint hypoglycemia.  Glucose earlier today was 10.  Patient was given oral glucose and intravenous glucose and 2 L of IV fluid via EMS.  His CBG elevated to 139.  He has a history of diabetes, but no hypoglycemic medication in the last 2 days.  Prodromal nausea, vomiting, diarrhea.  Review of systems positive for protuberant abdomen.  Severity of symptoms is moderate to severe.     Past Medical History:  Diagnosis Date  . AAA (abdominal aortic aneurysm) ([place])   . CHF (congestive heart failure) ([place])   . Coronary artery disease    a. CABG 2001 with early failure of VG-RCA. b. Inf MI after Lexiscan 01/28/2009 s/p RCA stent. c. Stent to dRCA 02/24/09. d. DES to [place]  2012. e. NSTEMI s/p angioplasty for ISR of RCA 09/2011. f. 05/2012 Cath: LM 20-30p, LAD 100p, LCX min irregs, RCA patent prox stent, 61m ISR, VG->Diag nl w/ dzs in D1 prox to graft, VG->RCA 100, VG->OM nl, LIMA->LAD nl, Nl EF->Med Rx; g. 09/2014 low-risk MV.  Marland Kitchen Dyspnea   . GERD (gastroesophageal reflux disease)   . History of echocardiogram    a. 09/2014 Echo: EF 55-60%, mild basal-midinferior HK, triv AI.  Marland Kitchen Hyperlipidemia   . Hypertension   . Hypertensive heart disease   . Hypothyroidism   . Myocardial infarction ([place])   . Osteoarthritis    a.End-stage osteoarthritis, right knee, medial compartment  . Personal history of tobacco use   . Prostate cancer ([place])   . Skin cancer    melanoma  . Type II diabetes mellitus [facility])     Patient Active Problem List   Diagnosis Date Noted  . Malignant neoplasm of prostate ([place]) 06/04/2018  . Intractable nausea and vomiting 01/13/2018  . S/P PTCA (percutaneous  transluminal coronary angioplasty) 11/04/2017  . Angina pectoris ([place]) 10/15/2017  . Abdominal aortic aneurysm (AAA) without rupture ([place]) 08/10/2016  . Acute on chronic kidney failure-II 08/09/2016  . GERD (gastroesophageal reflux disease) 08/08/2016  . Gastroenteritis 08/08/2016  . Sepsis ([place]) 08/08/2016  . Cough 08/08/2016  . Hypertensive heart disease   . Type II diabetes mellitus ([place])   . Thrombocytopenia ([place]) 06/08/2012  . NSTEMI (non-ST elevated myocardial infarction) ([place]) 10/09/2011  . Unstable angina ([place]) 10/08/2011  . Colitis 07/06/2011  . Diabetes mellitus ([place]) 07/05/2011  . Diarrhea 07/05/2011  . Weakness generalized 07/05/2011  . Fatigue 11/11/2010  . Coronary artery disease   . Hypertension   . Hypothyroidism   . Hyperlipidemia   . SOB (shortness of breath) on exertion   . Personal history of tobacco use     Past Surgical History:  Procedure Laterality Date  . CARDIAC CATHETERIZATION    . CARDIAC CATHETERIZATION N/A 12/06/2015   Procedure: Left Heart Cath and Cors/Grafts Angiography;  Surgeon: Burnell Blanks, MD;  Location: [facility] CV LAB;  Service: Cardiovascular;  Laterality: N/A;  . CARDIAC CATHETERIZATION N/A 12/06/2015   Procedure: Coronary Balloon Angioplasty;  Surgeon: Burnell Blanks, MD;  Location: [facility] CV LAB;  Service: Cardiovascular;  Laterality: N/A;  . CATARACT EXTRACTION    . CORONARY ARTERY BYPASS GRAFT    .  EYE SURGERY    . HERNIA REPAIR  1985  . INTRAVASCULAR ULTRASOUND/IVUS N/A 10/15/2017   Procedure: Intravascular Ultrasound/IVUS;  Surgeon: Belva Crome, MD;  Location: [facility];  Service: Cardiovascular;  Laterality: N/A;  . KNEE ARTHROSCOPY  1999 and 2004  . LEFT HEART CATH AND CORS/GRAFTS ANGIOGRAPHY N/A 10/15/2017   Procedure: LEFT HEART CATH AND CORS/GRAFTS ANGIOGRAPHY;  Surgeon: Belva Crome, MD;  Location: [facility] [facility] CV LAB;  Service: Cardiovascular;  Laterality: N/A;  . LEFT HEART  CATHETERIZATION WITH CORONARY ANGIOGRAM N/A 10/09/2011   Procedure: LEFT HEART CATHETERIZATION WITH CORONARY ANGIOGRAM;  Surgeon: Peter M Martinique, MD;  Location: [facility] C[facility] LAB;  Service: Cardiovascular;  Laterality: N/A;  . LEFT HEART CATHETERIZATION WITH CORONARY/GRAFT ANGIOGRAM N/A 06/06/2012   Procedure: LEFT HEART CATHETERIZATION WITH Beatrix Fetters;  Surgeon: Peter M Martinique, MD;  Location: [facility] CATH LAB;  Service: Cardiovascular;  Laterality: N/A;        Home Medications    Prior to Admission medications   Medication Sig Start Date End Date Taking? Authorizing Provider  acetaminophen (TYLENOL) 500 MG tablet Take 1,000 mg by mouth as needed for mild pain.   Yes [provider]  aspirin 81 MG tablet Take 1 tablet (81 mg total) by mouth daily. 10/10/11  Yes [name], NP  atorvastatin (LIPITOR) 40 MG tablet TAKE 1 TABLET BY MOUTH  DAILY 05/29/17  Yes [name], [name], MD  clopidogrel (PLAVIX) 75 MG tablet TAKE 1 TABLET BY MOUTH  DAILY 05/29/17  Yes [name], [name], MD  glimepiride (AMARYL) 4 MG tablet Take 4 mg by mouth 2 (two) times daily.    Yes [provider]  hydrochlorothiazide (HYDRODIURIL) 25 MG tablet Take 1 tablet (25 mg total) by mouth daily. 12/19/13  Yes [name], [name], MD  isosorbide mononitrate (IMDUR) 120 MG 24 hr tablet Take 1 tablet (120 mg total) by mouth daily. 10/16/17  Yes [name], [name], PA-C  KLOR-CON 10 10 MEQ tablet TAKE 1 TABLET BY MOUTH EVERY DAY 02/06/18  Yes [name], [name], MD  levothyroxine (SYNTHROID, LEVOTHROID) 100 MCG tablet Take 100 mcg by mouth daily before breakfast.    Yes [provider]  lisinopril (PRINIVIL,ZESTRIL) 10 MG tablet Take 1 tablet (10 mg total) by mouth daily. 10/11/17 07/08/18 Yes Bhagat, Bhavinkumar, PA  metFORMIN (GLUCOPHAGE) 1000 MG tablet Take 1,000 mg by mouth 2 (two) times daily with a meal. Reports taking 1000 mg in the morning and 500 mg in the evening.   Yes [provider]  metoprolol tartrate (LOPRESSOR) 25 MG tablet TAKE 1 TABLET BY MOUTH TWO  TIMES DAILY Patient taking differently: Take 12.5 mg by mouth 2 (two) times daily.  05/29/17  Yes [name], [name], MD  ondansetron (ZOFRAN ODT) 4 MG disintegrating tablet Take 1 tablet (4 mg total) by mouth every 8 (eight) hours as needed. Patient taking differently: Take 4 mg by mouth every 8 (eight) hours as needed for nausea or vomiting.  02/07/18  Yes [name], MD  pantoprazole (PROTONIX) 40 MG tablet Take 1 tablet (40 mg total) by mouth 2 (two) times daily. 01/14/18  Yes Neva Seat, MD  phenazopyridine (PYRIDIUM) 200 MG tablet Take 1 tablet (200 mg total) by mouth 3 (three) times daily. 02/07/18  Yes [name], MD  Polyethyl Glycol-Propyl Glycol (SYSTANE FREE OP) Place 1 drop into both eyes 3 (three) times daily as needed (for dry eyes.).    Yes [provider]  tamsulosin [facility])  0.4 MG CAPS capsule Take 0.4 mg by mouth daily. 05/26/18  Yes [provider]  fluocinonide (LIDEX) 0.05 % external solution Apply 1 mL topically 2 (two) times daily as needed (dryness and itching).  12/24/17   [provider]    Family History Family History  Problem Relation Age of Onset  . Hypertension Mother   . Unexplained death Mother   . Throat cancer Sister   . Lung cancer Sister   . Heart disease Brother   . Pneumonia Brother   . Heart disease Brother   . Prostate cancer Brother   . Unexplained death Brother        at birth  . Unexplained death Brother   . Prostate cancer Son   . Lung cancer Son   . Prostate cancer Other     Social History Social History   Tobacco Use  . Smoking status: Former Smoker    Packs/day: 1.00    Years: 20.00    Pack years: 20.00    Types: Cigarettes    Last attempt to quit: 08/01/1975    Years since quitting: 42.8  . Smokeless tobacco: Never Used  Substance Use Topics  . Alcohol use: No  . Drug use: No     Allergies    Codeine   Review of Systems Review of Systems  Unable to perform ROS: Acuity of condition     Physical Exam Updated Vital Signs BP (!) 114/92 (BP Location: Left Arm)   Pulse 91   Temp 98.1 F (36.7 C) (Oral)   Resp 16   SpO2 95%   Physical Exam  Constitutional: He is oriented to person, place, and time.  alert  HENT:  Head: Normocephalic and atraumatic.  Eyes: Conjunctivae are normal.  Neck: Neck supple.  Cardiovascular: Normal rate and regular rhythm.  Pulmonary/Chest: Effort normal and breath sounds normal.  Abdominal:  Distended; nontender  Musculoskeletal: Normal range of motion.  Neurological: He is alert and oriented to person, place, and time.  Skin: Skin is warm and dry.  Psychiatric: He has a normal mood and affect. His behavior is normal.  Nursing note and vitals reviewed.    ED Treatments / Results  Labs (all labs ordered are listed, but only abnormal results are displayed) Labs Reviewed  CBC WITH DIFFERENTIAL/PLATELET - Abnormal; Notable for the following components:      Result Value   Hemoglobin 12.7 (*)    MCH 25.6 (*)    Monocytes Absolute 1.1 (*)    All other components within normal limits  COMPREHENSIVE METABOLIC PANEL - Abnormal; Notable for the following components:   Potassium 3.2 (*)    CO2 21 (*)    Glucose, Bld 47 (*)    BUN 56 (*)    Creatinine, Ser 1.96 (*)    Calcium 8.2 (*)    Total Protein 6.2 (*)    Albumin 3.0 (*)    GFR calc non Af Amer 30 (*)    GFR calc Af Amer 35 (*)    All other components within normal limits  CBG MONITORING, ED - Abnormal; Notable for the following components:   Glucose-Capillary 35 (*)    All other components within normal limits  LIPASE, BLOOD  CBG MONITORING, ED    EKG None  Radiology No results found.  Procedures Procedures (including critical care time)  Medications Ordered in ED Medications  sodium chloride 0.9 % bolus 500 mL (0 mLs Intravenous Stopped 06/06/18 2200)   ondansetron (ZOFRAN) injection  4 mg (4 mg Intravenous Given 06/06/18 2107)  0.9 %  sodium chloride infusion ( Intravenous New Bag/Given 06/06/18 2202)  dextrose 50 % solution 50 mL (50 mLs Intravenous Given 06/06/18 2140)  iohexol (OMNIPAQUE) 300 MG/ML solution 30 mL (30 mLs Oral Contrast Given 06/06/18 2147)     Initial Impression / Assessment and Plan / ED Course  I have reviewed the triage vital signs and the nursing notes.  Pertinent labs & imaging results that were available during my care of the patient were reviewed by me and considered in my medical decision making (see chart for details).     Patient presents with nausea, vomiting, diarrhea for 2 days.  Additionally, his glucose was extremely low today.  Ironically, he has not been taking his oral hypoglycemic.  He feels much better after IV hydration.  However his glucose bottomed out again approximately 1 hour ago.  Will need to admit for further observation, hydration, glucose monitoring.  CRITICAL CARE Performed by: Nat Christen Total critical care time: 30 minutes Critical care time was exclusive of separately billable procedures and treating other patients. Critical care was necessary to treat or prevent imminent or life-threatening deterioration. Critical care was time spent personally by me on the following activities: development of treatment plan with patient and/or surrogate as well as nursing, discussions with consultants, evaluation of patient's response to treatment, examination of patient, obtaining history from patient or surrogate, ordering and performing treatments and interventions, ordering and review of laboratory studies, ordering and review of radiographic studies, pulse oximetry and re-evaluation of patient's condition.  Final Clinical Impressions(s) / ED Diagnoses   Final diagnoses:  Hypoglycemia  Nausea vomiting and diarrhea    ED Discharge Orders    None       Nat Christen, MD 06/06/18 2213

## 2018-06-06 NOTE — ED Notes (Signed)
Pt CBG 76. RN notified

## 2018-06-06 NOTE — ED Triage Notes (Signed)
Pt has a hx of prostate and lymph nodes and rib cage

## 2018-06-06 NOTE — ED Notes (Signed)
ED TO INPATIENT HANDOFF REPORT  Name/Age/Gender Benjamin Harrison 83 y.o. male  Code Status Code Status History    Date Active Date Inactive Code Status Order ID Comments User Context   01/13/2018 1115 01/14/2018 1832 Full Code [id number]  Einar Gip, DO ED   10/15/2017 1429 10/16/2017 1817 Full Code [id number]  Belva Crome, MD Inpatient   08/08/2016 2006 08/10/2016 2137 Full Code [id number]  Ivor Costa, MD ED   12/05/2015 1842 12/07/2015 1539 Full Code [id number]  Rogelia Mire, NP Inpatient   10/08/2011 1636 10/10/2011 1436 Full Code [id number]  Deloris Ping, RN Inpatient      Home/SNF/Other Home  Chief Complaint Hypercalcemia  Level of Care/Admitting Diagnosis ED Disposition    ED Disposition Condition [facility] Area: [facility] [[id number]]  Level of Care: Med-Surg [16]  Diagnosis: Nausea vomiting and diarrhea [254270]  Admitting Physician: Shela Leff [[id number]]  Attending Physician: Shela Leff [[id number]]  PT Class (Do Not Modify): Observation [104]  PT Acc Code (Do Not Modify): Observation [10022]       Medical History Past Medical History:  Diagnosis Date  . AAA (abdominal aortic aneurysm) ([place])   . CHF (congestive heart failure) ([place])   . Coronary artery disease    a. CABG 2001 with early failure of VG-RCA. b. Inf MI after Lexiscan 01/28/2009 s/p RCA stent. c. Stent to dRCA 02/24/09. d. DES to [place]  2012. e. NSTEMI s/p angioplasty for ISR of RCA 09/2011. f. 05/2012 Cath: LM 20-30p, LAD 100p, LCX min irregs, RCA patent prox stent, 3mISR, VG->Diag nl w/ dzs in D1 prox to graft, VG->RCA 100, VG->OM nl, LIMA->LAD nl, Nl EF->Med Rx; g. 09/2014 low-risk MV.  .Marland KitchenDyspnea   . GERD (gastroesophageal reflux disease)   . History of echocardiogram    a. 09/2014 Echo: EF 55-60%, mild basal-midinferior HK, triv AI.  .Marland KitchenHyperlipidemia   . Hypertension   . Hypertensive heart disease   . Hypothyroidism   . Myocardial  infarction ([place]   . Osteoarthritis    a.End-stage osteoarthritis, right knee, medial compartment  . Personal history of tobacco use   . [place]state cancer ([place]   . Skin cancer    melanoma  . Type II diabetes mellitus ([facility])     Allergies Allergies  Allergen Reactions  . Codeine Nausea Only    Pt reports this happens off and on    IV Location/Drains/Wounds Patient Lines/Drains/Airways Status   Active Line/Drains/Airways    Name:   Placement date:   Placement time:   Site:   Days:   Peripheral IV 02/07/18 Right Forearm   02/07/18    2113    Forearm   119          Labs/Imaging Results for orders placed or performed during the hospital encounter of 06/06/18 (from the past 48 hour(s))  CBC with Differential     Status: Abnormal   Collection Time: 06/06/18  8:29 PM  Result Value Ref Range   WBC 7.5 4.0 - 10.5 K/uL    Comment: WHITE COUNT CONFIRMED ON SMEAR   RBC 4.97 4.22 - 5.81 MIL/uL   Hemoglobin 12.7 (L) 13.0 - 17.0 g/dL   HCT 40.0 39.0 - 52.0 %   MCV 80.5 80.0 - 100.0 fL   MCH 25.6 (L) 26.0 - 34.0 pg   MCHC 31.8 30.0 - 36.0 g/dL   RDW 15.4 11.5 - 15.5 %   Platelets 173 150 -  400 K/uL   nRBC 0.0 0.0 - 0.2 %   Neutrophils Relative % 70 %   Neutro Abs 5.3 1.7 - 7.7 K/uL   Lymphocytes Relative 14 %   Lymphs Abs 1.0 0.7 - 4.0 K/uL   Monocytes Relative 14 %   Monocytes Absolute 1.1 (H) 0.1 - 1.0 K/uL   Eosinophils Relative 2 %   Eosinophils Absolute 0.1 0.0 - 0.5 K/uL   Basophils Relative 0 %   Basophils Absolute 0.0 0.0 - 0.1 K/uL   Immature Granulocytes 0 %   Abs Immature Granulocytes 0.02 0.00 - 0.07 K/uL    Comment: Performed at [facility], [place] [address] [place], [place] [address]  Comprehensive metabolic panel     Status: Abnormal   Collection Time: 06/06/18  8:29 PM  Result Value Ref Range   Sodium 137 135 - 145 mmol/L   Potassium 3.2 (L) 3.5 - 5.1 mmol/L   Chloride 106 98 - 111 mmol/L   CO2 21 (L) 22 - 32 mmol/L   Glucose, Bld 47 (L)  70 - 99 mg/dL   BUN 56 (H) 8 - 23 mg/dL   Creatinine, Ser 1.96 (H) 0.61 - 1.24 mg/dL   Calcium 8.2 (L) 8.9 - 10.3 mg/dL   Total Protein 6.2 (L) 6.5 - 8.1 g/dL   Albumin 3.0 (L) 3.5 - 5.0 g/dL   AST 15 15 - 41 U/L   ALT 18 0 - 44 U/L   Alkaline Phosphatase 48 38 - 126 U/L   Total Bilirubin 0.7 0.3 - 1.2 mg/dL   GFR calc non Af Amer 30 (L) >60 mL/min   GFR calc Af Amer 35 (L) >60 mL/min    Comment: (NOTE) The eGFR has been calculated using the CKD EPI equation. This calculation has not been validated in all clinical situations. eGFR's persistently <60 mL/min signify possible Chronic Kidney Disease.    Anion gap 10 5 - 15    Comment: Performed at [facility], [place] [address] [place], [place] [address]  Lipase, blood     Status: None   Collection Time: 06/06/18  8:29 PM  Result Value Ref Range   Lipase 22 11 - 51 U/L    Comment: Performed at [facility], [place] [address] [place], [place] [address]  POC CBG, ED     Status: Abnormal   Collection Time: 06/06/18  8:55 PM  Result Value Ref Range   Glucose-Capillary 35 (LL) 70 - 99 mg/dL   Comment 1 Notify RN    Comment 2 Document in Chart   POC CBG, ED     Status: None   Collection Time: 06/06/18 10:16 PM  Result Value Ref Range   Glucose-Capillary 76 70 - 99 mg/dL   No results found. None  Pending Labs Unresulted Labs (From admission, onward)   None      Vitals/Pain Today's Vitals   06/06/18 1949 06/06/18 2035 06/06/18 2144 06/06/18 2218  BP: 117/70 117/70 (!) 114/92 136/74  Pulse: 77 75 91 78  Resp: _0 Temp: 98.2 F (36.8 C)  98.1 F (36.7 C)   TempSrc: Oral  Oral   SpO2: 96% 94% 95% 98%  PainSc: 0-No pain  0-No pain     Isolation Precautions No active isolations  Medications Medications  sodium chloride 0.9 % bolus 500 mL (0 mLs Intravenous Stopped 06/06/18 2200)  ondansetron (ZOFRAN) injection 4 mg (4 mg Intravenous Given 06/06/18 2107)  0.9 %  sodium chloride  infusion ( Intravenous New Bag/Given 06/06/18 2202)  dextrose 50 % solution 50 mL (50 mLs Intravenous Given 06/06/18 2140)  iohexol (OMNIPAQUE) 300 MG/ML solution 30 mL (30 mLs Oral Contrast Given 06/06/18 2147)    Mobility Walks

## 2018-06-06 NOTE — ED Notes (Signed)
Bed: WA21 Expected date:  Expected time:  Means of arrival:  Comments: EMS diarrhea

## 2018-06-07 ENCOUNTER — Encounter (HOSPITAL_COMMUNITY): Payer: Self-pay

## 2018-06-07 ENCOUNTER — Other Ambulatory Visit: Payer: Self-pay

## 2018-06-07 DIAGNOSIS — A084 Viral intestinal infection, unspecified: Secondary | ICD-10-CM | POA: Diagnosis present

## 2018-06-07 DIAGNOSIS — Z8546 Personal history of malignant neoplasm of prostate: Secondary | ICD-10-CM | POA: Diagnosis not present

## 2018-06-07 DIAGNOSIS — R112 Nausea with vomiting, unspecified: Secondary | ICD-10-CM

## 2018-06-07 DIAGNOSIS — N183 Chronic kidney disease, stage 3 (moderate): Secondary | ICD-10-CM

## 2018-06-07 DIAGNOSIS — I252 Old myocardial infarction: Secondary | ICD-10-CM | POA: Diagnosis not present

## 2018-06-07 DIAGNOSIS — E876 Hypokalemia: Secondary | ICD-10-CM

## 2018-06-07 DIAGNOSIS — D649 Anemia, unspecified: Secondary | ICD-10-CM | POA: Diagnosis not present

## 2018-06-07 DIAGNOSIS — E162 Hypoglycemia, unspecified: Secondary | ICD-10-CM | POA: Diagnosis not present

## 2018-06-07 DIAGNOSIS — N179 Acute kidney failure, unspecified: Secondary | ICD-10-CM | POA: Diagnosis not present

## 2018-06-07 DIAGNOSIS — E860 Dehydration: Secondary | ICD-10-CM | POA: Diagnosis present

## 2018-06-07 DIAGNOSIS — I2510 Atherosclerotic heart disease of native coronary artery without angina pectoris: Secondary | ICD-10-CM | POA: Diagnosis present

## 2018-06-07 DIAGNOSIS — I130 Hypertensive heart and chronic kidney disease with heart failure and stage 1 through stage 4 chronic kidney disease, or unspecified chronic kidney disease: Secondary | ICD-10-CM | POA: Diagnosis present

## 2018-06-07 DIAGNOSIS — D631 Anemia in chronic kidney disease: Secondary | ICD-10-CM | POA: Diagnosis present

## 2018-06-07 DIAGNOSIS — I714 Abdominal aortic aneurysm, without rupture: Secondary | ICD-10-CM | POA: Diagnosis present

## 2018-06-07 DIAGNOSIS — E039 Hypothyroidism, unspecified: Secondary | ICD-10-CM | POA: Diagnosis present

## 2018-06-07 DIAGNOSIS — Z85820 Personal history of malignant melanoma of skin: Secondary | ICD-10-CM | POA: Diagnosis not present

## 2018-06-07 DIAGNOSIS — Z87891 Personal history of nicotine dependence: Secondary | ICD-10-CM | POA: Diagnosis not present

## 2018-06-07 DIAGNOSIS — E11649 Type 2 diabetes mellitus with hypoglycemia without coma: Secondary | ICD-10-CM | POA: Diagnosis present

## 2018-06-07 DIAGNOSIS — E785 Hyperlipidemia, unspecified: Secondary | ICD-10-CM | POA: Diagnosis present

## 2018-06-07 DIAGNOSIS — Z885 Allergy status to narcotic agent status: Secondary | ICD-10-CM | POA: Diagnosis not present

## 2018-06-07 DIAGNOSIS — K219 Gastro-esophageal reflux disease without esophagitis: Secondary | ICD-10-CM | POA: Diagnosis present

## 2018-06-07 DIAGNOSIS — N400 Enlarged prostate without lower urinary tract symptoms: Secondary | ICD-10-CM | POA: Diagnosis present

## 2018-06-07 DIAGNOSIS — E1122 Type 2 diabetes mellitus with diabetic chronic kidney disease: Secondary | ICD-10-CM | POA: Diagnosis present

## 2018-06-07 DIAGNOSIS — Z801 Family history of malignant neoplasm of trachea, bronchus and lung: Secondary | ICD-10-CM | POA: Diagnosis not present

## 2018-06-07 DIAGNOSIS — M1711 Unilateral primary osteoarthritis, right knee: Secondary | ICD-10-CM | POA: Diagnosis present

## 2018-06-07 DIAGNOSIS — Z8249 Family history of ischemic heart disease and other diseases of the circulatory system: Secondary | ICD-10-CM | POA: Diagnosis not present

## 2018-06-07 DIAGNOSIS — Z951 Presence of aortocoronary bypass graft: Secondary | ICD-10-CM | POA: Diagnosis not present

## 2018-06-07 DIAGNOSIS — R197 Diarrhea, unspecified: Secondary | ICD-10-CM

## 2018-06-07 LAB — GLUCOSE, CAPILLARY
GLUCOSE-CAPILLARY: 104 mg/dL — AB (ref 70–99)
GLUCOSE-CAPILLARY: 128 mg/dL — AB (ref 70–99)
GLUCOSE-CAPILLARY: 30 mg/dL — AB (ref 70–99)
Glucose-Capillary: 115 mg/dL — ABNORMAL HIGH (ref 70–99)
Glucose-Capillary: 116 mg/dL — ABNORMAL HIGH (ref 70–99)
Glucose-Capillary: 140 mg/dL — ABNORMAL HIGH (ref 70–99)
Glucose-Capillary: 141 mg/dL — ABNORMAL HIGH (ref 70–99)
Glucose-Capillary: 174 mg/dL — ABNORMAL HIGH (ref 70–99)
Glucose-Capillary: 177 mg/dL — ABNORMAL HIGH (ref 70–99)
Glucose-Capillary: 194 mg/dL — ABNORMAL HIGH (ref 70–99)
Glucose-Capillary: 210 mg/dL — ABNORMAL HIGH (ref 70–99)
Glucose-Capillary: 29 mg/dL — CL (ref 70–99)
Glucose-Capillary: 50 mg/dL — ABNORMAL LOW (ref 70–99)
Glucose-Capillary: 54 mg/dL — ABNORMAL LOW (ref 70–99)
Glucose-Capillary: 75 mg/dL (ref 70–99)

## 2018-06-07 LAB — GASTROINTESTINAL PANEL BY PCR, STOOL (REPLACES STOOL CULTURE)

## 2018-06-07 LAB — BASIC METABOLIC PANEL
Anion gap: 6 (ref 5–15)
BUN: 43 mg/dL — AB (ref 8–23)
CHLORIDE: 110 mmol/L (ref 98–111)
CO2: 22 mmol/L (ref 22–32)
Calcium: 8.2 mg/dL — ABNORMAL LOW (ref 8.9–10.3)
Creatinine, Ser: 1.6 mg/dL — ABNORMAL HIGH (ref 0.61–1.24)
GFR calc Af Amer: 45 mL/min — ABNORMAL LOW (ref >60)
GFR calc non Af Amer: 39 mL/min — ABNORMAL LOW (ref >60)
Glucose, Bld: 86 mg/dL (ref 70–99)
Potassium: 3.6 mmol/L (ref 3.5–5.1)
SODIUM: 138 mmol/L (ref 135–145)

## 2018-06-07 MED ORDER — ENOXAPARIN SODIUM 40 MG/0.4ML ~~LOC~~ SOLN
40.0000 mg | Freq: Every day | SUBCUTANEOUS | Status: DC
  Administered 2018-06-07: 40 mg via SUBCUTANEOUS
  Filled 2018-06-07: qty 0.4

## 2018-06-07 MED ORDER — MAGNESIUM OXIDE 400 (241.3 MG) MG PO TABS
400.0000 mg | ORAL_TABLET | Freq: Two times a day (BID) | ORAL | Status: AC
  Administered 2018-06-07 (×2): 400 mg via ORAL
  Filled 2018-06-07 (×2): qty 1

## 2018-06-07 MED ORDER — MAGNESIUM SULFATE 2 GM/50ML IV SOLN
2.0000 g | Freq: Once | INTRAVENOUS | Status: AC
  Administered 2018-06-07: 2 g via INTRAVENOUS
  Filled 2018-06-07: qty 50

## 2018-06-07 MED ORDER — ENSURE ENLIVE PO LIQD: 237.0000 mL | Freq: Two times a day (BID) | ORAL | Status: DC

## 2018-06-07 MED ORDER — KCL IN DEXTROSE-NACL 20-5-0.9 MEQ/L-%-% IV SOLN
INTRAVENOUS | Status: DC
  Administered 2018-06-07 – 2018-06-08 (×3): via INTRAVENOUS
  Filled 2018-06-07 (×4): qty 1000

## 2018-06-07 MED ORDER — DEXTROSE 50 % IV SOLN
INTRAVENOUS | Status: AC
  Filled 2018-06-07: qty 50

## 2018-06-07 MED ORDER — DEXTROSE 5 % IV BOLUS
1000.0000 mL | Freq: Once | INTRAVENOUS | Status: AC
  Administered 2018-06-07: 1000 mL via INTRAVENOUS

## 2018-06-07 MED ORDER — POTASSIUM CHLORIDE CRYS ER 20 MEQ PO TBCR
40.0000 meq | EXTENDED_RELEASE_TABLET | Freq: Two times a day (BID) | ORAL | Status: AC
  Administered 2018-06-07 (×2): 40 meq via ORAL
  Filled 2018-06-07 (×2): qty 2

## 2018-06-07 MED ORDER — BOOST / RESOURCE BREEZE PO LIQD CUSTOM
1.0000 | Freq: Two times a day (BID) | ORAL | Status: DC
  Administered 2018-06-07 – 2018-06-08 (×2): 1 via ORAL

## 2018-06-07 MED ORDER — ADULT MULTIVITAMIN W/MINERALS CH
1.0000 | ORAL_TABLET | Freq: Every day | ORAL | Status: DC
  Administered 2018-06-07 – 2018-06-08 (×2): 1 via ORAL
  Filled 2018-06-07 (×2): qty 1

## 2018-06-07 MED ORDER — PREMIER PROTEIN SHAKE
11.0000 [oz_av] | ORAL | Status: DC
  Filled 2018-06-07: qty 325.31

## 2018-06-07 MED ORDER — LOPERAMIDE HCL 2 MG PO CAPS
2.0000 mg | ORAL_CAPSULE | ORAL | Status: DC | PRN
  Administered 2018-06-07: 2 mg via ORAL
  Filled 2018-06-07: qty 1

## 2018-06-07 MED ORDER — ORAL CARE MOUTH RINSE
15.0000 mL | Freq: Two times a day (BID) | OROMUCOSAL | Status: DC
  Administered 2018-06-07 (×2): 15 mL via OROMUCOSAL

## 2018-06-07 NOTE — Progress Notes (Addendum)
[facility] PROGRESS NOTE    Progress Note  Benjamin Harrison  SJG:[id number] DOB: [birth year] DOA: 06/06/2018 PCP: Benjamin Downing, MD     Brief Narrative:   Benjamin Harrison is an 83 y.o. male past medical history significant for coronary artery disease post CABG and PCI, heart failure Beatties mellitus comes into the hospital for hypoglycemia. Patient has been vomiting having diarrhea for the past 2 days, he relates he has not been taking any of his diabetes medication Metformin or glyburide for the past 2 days  Assessment/Plan:   Hypoglycemia: The setting of a long-acting sulfonylurea and new acute renal failure, is probably prolonging the half-life of her sulfonylurea. Continue D5 half-normal his blood glucose is improved.  Blood glucose still 75 we will bolus a liter of D5W. Continue to hold oral hypoglycemic agents.  Viral gastroenteritis: She has remained afebrile with no leukocytosis CT scan of the abdomen was negative for any acute abnormalities.  GI pathogen is pending. He relates he still nauseated and vomited this morning Zofran seems to be working. He is anorexic.  Acute kidney injury on chronic kidney disease stage III: Baseline creatinine around 1.1, on admission 1.9 likely prerenal azotemia.  2 with significant decreased oral intake. Started on IV fluids and her creatinine is slowly improving today is 1.6. Continue IV fluids strict I's and O's when she is about 1.5 L positive.  I's and O's are poorly recorded.  Chronic anemia: Hemoglobin at baseline.  Hypokalemia: In the setting of nausea vomiting and diarrhea. Repleted orally.,  Will continue to replete, repeat a basic metabolic panel in the morning Her mag was 1.5 we will give her mag oxide  History of CAD (coronary artery disease) Stable no chest pain continue aspirin and Plavix. Continue Imdur and metoprolol.  Essential hypertension: Continue current home meds.  Hyperlipidemia: Continue  Lipitor.   DVT prophylaxis: lovenox Family Communication:wife Disposition Plan/Barrier to D/C: unable to determine Code Status:     Code Status Orders  (From admission, onward)         Start     Ordered   06/06/18 2316  Full code  Continuous     06/06/18 2315        Code Status History    Date Active Date Inactive Code Status Order ID Comments User Context   01/13/2018 1115 01/14/2018 1832 Full Code [id number]  Benjamin Starcher, DO ED   10/15/2017 1429 10/16/2017 1817 Full Code [id number]  Benjamin Crome, MD Inpatient   08/08/2016 2006 08/10/2016 2137 Full Code [id number]  Benjamin Costa, MD ED   12/05/2015 1842 12/07/2015 1539 Full Code [id number]  Benjamin Mire, NP Inpatient   10/08/2011 1636 10/10/2011 1436 Full Code [id number]  Benjamin Ping, RN Inpatient        IV Access:    Peripheral IV   Procedures and diagnostic studies:   Ct Abdomen Pelvis Wo Contrast  Result Date: 06/06/2018 CLINICAL DATA:  Nausea, vomiting, and diarrhea. Abdominal distension. Personal history of prostate cancer. EXAM: CT ABDOMEN AND PELVIS WITHOUT CONTRAST TECHNIQUE: Multidetector CT imaging of the abdomen and pelvis was performed following the standard protocol without IV contrast. COMPARISON:  None. FINDINGS: Lower chest: The lung bases are clear without focal nodule, mass, or airspace disease. Heart is mildly enlarged. Extensive coronary artery calcifications are present. Calcifications are also present at the aortic valve. There are calcifications in the descending and thoracic aorta. No significant pleural or pericardial effusion is present. Hepatobiliary: No focal liver abnormality  is seen. No gallstones, gallbladder wall thickening, or biliary dilatation. Pancreas: Unremarkable. No pancreatic ductal dilatation or surrounding inflammatory changes. Spleen: Normal in size without focal abnormality. Adrenals/Urinary Tract: Adrenal glands are normal bilaterally. There is stranding about both kidneys  without significant dilation. Exophytic cyst of the right kidney is stable from 02/07/18 and 08/08/2014. No other renal lesions are present. The urinary bladder is within normal limits. Stomach/Bowel: The stomach is mildly distended. There is no obstruction or mass lesion. The duodenum is normal. Small bowel is unremarkable. The ascending and transverse colon are within normal limits. Diverticular changes are present in the distal descending and sigmoid colon without focal inflammation. Vascular/Lymphatic: Atherosclerotic calcifications are present in the aorta and branch vessels. Infrarenal abdominal aortic aneurysm measures up to 3.6 cm. This is not changed significantly since 2018. No significant retroperitoneal adenopathy is present. Reproductive: Prostate is unremarkable. Other: Multilevel degenerative changes of the lumbar spine are again noted. No focal lytic or blastic lesions are present. Pelvis is intact. The hips are located and within normal limits. Musculoskeletal: Slight degenerative retrolisthesis is present at L3-4 and L4-5. Levoconvex curvature is centered at the thoracolumbar junction. IMPRESSION: 1. No acute or focal lesion to explain the patient's symptoms. 2. New stranding about both kidneys. 3. Stable exophytic cyst of the right kidney. 4. Degenerative disc disease is most evident at L3-4 and L4-5 Electronically Signed   By: Benjamin Harrison M.D.   On: 06/06/2018 23:59     Medical Consultants:    None.  Anti-Infectives:   none  Subjective:    Benjamin Harrison he relates he continues to have diarrhea, complaining of abdominal pain.  Nausea this morning and vomiting.  Feels tired.  Objective:    Vitals:   06/06/18 2253 06/06/18 2300 06/07/18 0003 06/07/18 0650  BP: (!) 141/69 (!) 143/79 116/75 (!) 149/94  Pulse: 88 75 88 79  Resp: 16  18 19   Temp:   98.3 F (36.8 C) 98 F (36.7 C)  TempSrc:   Oral Oral  SpO2: 99% 97% 97% 98%  Weight:   74.3 kg   Height:   5\' 6"   (1.676 m)     Intake/Output Summary (Last 24 hours) at 06/07/2018 0747 Last data filed at 06/07/2018 0600 Gross per 24 hour  Intake 1357.6 ml  Output -  Net 1357.6 ml   Filed Weights   06/07/18 0003  Weight: 74.3 kg    Exam: General exam: In no acute distress. Respiratory system: Good air movement and clear to auscultation. Cardiovascular system: S1 & S2 heard, RRR.  Gastrointestinal system: Abdomen is nondistended, soft and right lower quadrant tenderness. Central nervous system: Alert and oriented. No focal neurological deficits. Extremities: No pedal edema. Skin: No rashes, lesions or ulcers Psychiatry: Judgement and insight appear normal. Mood & affect appropriate.    Data Reviewed:    Labs: Basic Metabolic Panel: Recent Labs  Lab 05/31/18 1100 06/06/18 2029 06/07/18 0543  NA 140 137 138  K 4.4 3.2* 3.6  CL 100 106 110  CO2 25 21* 22  GLUCOSE 146* 47* 86  BUN 18 56* 43*  CREATININE 1.19 1.96* 1.60*  CALCIUM 9.1 8.2* 8.2*  MG  --  1.5*  --    GFR Estimated Creatinine Clearance: 32.7 mL/min (A) (by C-G formula based on SCr of 1.6 mg/dL (H)). Liver Function Tests: Recent Labs  Lab 05/31/18 1100 06/06/18 2029  AST 20 15  ALT 22 18  ALKPHOS 97 48  BILITOT 0.8 0.7  PROT 6.5 6.2*  ALBUMIN 3.6 3.0*   Recent Labs  Lab 06/06/18 2029  LIPASE 22   No results for input(s): AMMONIA in the last 168 hours. Coagulation profile No results for input(s): INR, PROTIME in the last 168 hours.  CBC: Recent Labs  Lab 06/06/18 2029  WBC 7.5  NEUTROABS 5.3  HGB 12.7*  HCT 40.0  MCV 80.5  PLT 173   Cardiac Enzymes: No results for input(s): CKTOTAL, CKMB, CKMBINDEX, TROPONINI in the last 168 hours. BNP (last 3 results) Recent Labs    10/11/17 1000  PROBNP 210   CBG: Recent Labs  Lab 06/07/18 0230 06/07/18 0248 06/07/18 0431 06/07/18 0451 06/07/18 0640  GLUCAP 29* 177* 54* 194* 75   D-Dimer: No results for input(s): DDIMER in the last 72  hours. Hgb A1c: No results for input(s): HGBA1C in the last 72 hours. Lipid Profile: No results for input(s): CHOL, HDL, LDLCALC, TRIG, CHOLHDL, LDLDIRECT in the last 72 hours. Thyroid function studies: No results for input(s): TSH, T4TOTAL, T3FREE, THYROIDAB in the last 72 hours.  Invalid input(s): FREET3 Anemia work up: No results for input(s): VITAMINB12, FOLATE, FERRITIN, TIBC, IRON, RETICCTPCT in the last 72 hours. Sepsis Labs: Recent Labs  Lab 06/06/18 2029  WBC 7.5   Microbiology No results found for this or any previous visit (from the past 240 hour(s)).   Medications:   . aspirin EC  81 mg Oral Daily  . atorvastatin  40 mg Oral Daily  . clopidogrel  75 mg Oral Daily  . enoxaparin (LOVENOX) injection  40 mg Subcutaneous QHS  . feeding supplement (ENSURE ENLIVE)  237 mL Oral BID BM  . isosorbide mononitrate  120 mg Oral Daily  . levothyroxine  100 mcg Oral Q0600  . mouth rinse  15 mL Mouth Rinse BID  . metoprolol tartrate  12.5 mg Oral BID  . pantoprazole  40 mg Oral BID  . tamsulosin  0.4 mg Oral Daily   Continuous Infusions: . dextrose 5 % and 0.9 % NaCl with KCl 20 mEq/L 100 mL/hr at 06/07/18 0600      LOS: 0 days   Charlynne Cousins  [facility]   *Please refer to [email or web address], password [email or web address] to get updated schedule on who will round on this patient, as hospitalists switch teams weekly. If 7PM-7AM, please contact night-coverage at [email or web address], password [email or web address] for any overnight needs.  06/07/2018, 7:47 AM

## 2018-06-07 NOTE — Progress Notes (Signed)
Hypoglycemic Event  CBG: 30  Treatment: D50 IV 50 mL  Symptoms: Pale and Shaky  Follow-up CBG: Time: 0029 CBG Result: 141  Possible Reasons for Event: Vomiting and diarrhea   Comments/MD notified: Bodenheimer, NP    Lennix Kneisel

## 2018-06-07 NOTE — Progress Notes (Signed)
Hypoglycemic Event  CBG: 29  Treatment: D50 IV 50 mL  Symptoms: Pale  Follow-up CBG: Time 0248 CBG Result177  Possible Reasons for Event: Other: Diarhea   Comments/MD notified: Bodenheimer MD. IV fluids changed.   Gonzella Lex

## 2018-06-07 NOTE — Progress Notes (Signed)
Hypoglycemic Event  CBG: 54  Treatment: D50 IV 50 mL  Symptoms: Shaky  Follow-up CBG: Time: 0451 CBG Result: 194  Possible Reasons for Event: Diarrhea     Benjamin Harrison

## 2018-06-07 NOTE — Progress Notes (Signed)
Hypoglycemic Event  CBG: 50  Treatment: D50 IV 50 mL  Symptoms: Shaky  Follow-up CBG: CKIC:1798 CBG Result:210  Possible Reasons for Event: Diarrhea  Comments/MD notified:D5 bolus ordered    Rigby Leonhardt K

## 2018-06-07 NOTE — Progress Notes (Signed)
Initial Nutrition Assessment  DOCUMENTATION CODES:   Not applicable  INTERVENTION:  - Will d/c Ensure Enlive. - Will order Premier Protein once/day, this supplement provides 160 kcal and 30 grams of protein.  - Will order Boost Breeze BID, each supplement provides 250 kcal and 9 grams of protein. - Will order daily multivitamin with minerals.  - Continue to encourage PO intakes.    NUTRITION DIAGNOSIS:   Inadequate oral intake related to acute illness, nausea, vomiting, diarrhea, poor appetite as evidenced by per patient/family report.  GOAL:   Patient will meet greater than or equal to 90% of their needs  MONITOR:   PO intake, Supplement acceptance, Weight trends, Labs  REASON FOR ASSESSMENT:   Malnutrition Screening Tool  ASSESSMENT:   83 y.o. male with medical history significant of CAD s/p CABG and PCI, CHF, HTN, hyperlipidemia, hypothyroidism, and type 2 DM. He presented to the ED for evaluation of hypoglycemia. Patient states he has been vomiting and having diarrhea for the past 2 days.  He has been passing a lot of gas and his abdomen appears distended. Reported having generalized abdominal pain which is sharp, intermittent.  Patient reports ongoing nausea and abdominal pain and that he did not eat breakfast d/t this. Patient did order lunch and is going to try to eat a few bites (pot roast, a corn muffin, and mashed potatoes). Patient reports going N/V/D and abdominal pain for the past few days and that he has been eating mainly bites throughout the day because of everything coming back up or "going right through" him.   Per Dr. Barbra Sarks note today: hypoglycemia, viral gastroenteritis, AKI on CKD, hypokalemia 2/2 vomiting and diarrhea.  No muscle or fat wasting noted at this time. Per chart review, current weight is 164 lb which is consistent with weight on 10/31. Weight on 02/07/18 was 169 lb. This indicates 5 lb weight loss (3% body weight) in the past 4 months.    Medication reviewed; 100 mg oral Synthroid/day, PRN Imodium, 400 mg Mag-ox x2 doses today, 2 g IV Mg sulfate x1 run today, 40 mg oral Protonix BID, 40 mEq KCl x1 doses today and x1 dose yesterday. Labs reviewed; CBGs: 29-210 mg/dL since midnight, K: 3.2 mmol/L, BUN: 56 mg/dL, creatinine: 1.96 mg/dL, Ca: 8.2 mg/dL, Mg: 1.5 mg/dL, GFR: 30 mL/min.  IVF; D5-NS-20 mEq IV KCl @ 100 mL/hr (408 kcal).    NUTRITION - FOCUSED PHYSICAL EXAM:  Completed; no muscle and no fat wasting noted.   Diet Order:   Diet Order            Diet regular Room service appropriate? Yes; Fluid consistency: Thin  Diet effective now              EDUCATION NEEDS:   Not appropriate for education at this time  Skin:  Skin Assessment: Reviewed RN Assessment  Last BM:  11/8  Height:   Ht Readings from Last 1 Encounters:  06/07/18 5\' 6"  (1.676 m)    Weight:   Wt Readings from Last 1 Encounters:  06/07/18 74.3 kg    Ideal Body Weight:  64.54 kg  BMI:  Body mass index is 26.44 kg/m.  Estimated Nutritional Needs:   Kcal:  1860-2080 (25-28 kcal/kg)  Protein:  85-95 grams  Fluid:  >/= 1.8 L/day     Jarome Matin, MS, RD, LDN, [facility] Inpatient Clinical Dietitian Pager # [phone] After hours/weekend pager # [phone]

## 2018-06-07 NOTE — Progress Notes (Signed)
Patient given 2x D50. On call provider notified and new fluids ordered. Gonzella Lex, RN 06/07/2018

## 2018-06-07 NOTE — Progress Notes (Signed)
Chaplain provided support with pt, daughter, son in law.  Engaged in support around desires for care at end of life.  Pt is going over advance directive with family.  Wishes for chaplain to follow up when spouse is present.    Chaplain will follow up this afternoon to provide further assistance with documents.     Jerene Pitch, MDiv, [facility]

## 2018-06-07 NOTE — Progress Notes (Signed)
Competed advance directive with Benjamin Harrison and spouse, Joycelyn Schmid

## 2018-06-08 ENCOUNTER — Encounter (HOSPITAL_COMMUNITY): Payer: Self-pay

## 2018-06-08 DIAGNOSIS — I10 Essential (primary) hypertension: Secondary | ICD-10-CM

## 2018-06-08 DIAGNOSIS — D649 Anemia, unspecified: Secondary | ICD-10-CM

## 2018-06-08 LAB — GLUCOSE, CAPILLARY
GLUCOSE-CAPILLARY: 122 mg/dL — AB (ref 70–99)
Glucose-Capillary: 126 mg/dL — ABNORMAL HIGH (ref 70–99)
Glucose-Capillary: 133 mg/dL — ABNORMAL HIGH (ref 70–99)
Glucose-Capillary: 135 mg/dL — ABNORMAL HIGH (ref 70–99)
Glucose-Capillary: 140 mg/dL — ABNORMAL HIGH (ref 70–99)

## 2018-06-08 LAB — BASIC METABOLIC PANEL
Anion gap: 5 (ref 5–15)
BUN: 25 mg/dL — AB (ref 8–23)
CALCIUM: 8 mg/dL — AB (ref 8.9–10.3)
CO2: 21 mmol/L — AB (ref 22–32)
CREATININE: 1.22 mg/dL (ref 0.61–1.24)
Chloride: 111 mmol/L (ref 98–111)
GFR calc Af Amer: >60 mL/min (ref >60)
GFR, EST NON AFRICAN AMERICAN: 54 mL/min — AB (ref >60)
Glucose, Bld: 134 mg/dL — ABNORMAL HIGH (ref 70–99)
Potassium: 4.2 mmol/L (ref 3.5–5.1)
Sodium: 137 mmol/L (ref 135–145)

## 2018-06-08 LAB — MAGNESIUM: MAGNESIUM: 1.9 mg/dL (ref 1.7–2.4)

## 2018-06-08 NOTE — Discharge Summary (Signed)
Physician Discharge Summary  ALPHONS BURGERT UKG:[id number] DOB: 1934/08/23 DOA: 06/06/2018  PCP: Leonard Downing, MD  Admit date: 06/06/2018 Discharge date: 06/08/2018  Admitted From: home Disposition:  Home  Recommendations for Outpatient Follow-up:  1. Follow up with PCP in 1-2 weeks 2. Please obtain BMP/CBC in one week   Home Health:None Equipment/Devices:none  Discharge Condition:Stable CODE STATUS:full Diet recommendation: Heart Healthy  Brief/Interim Summary: 83 y.o. male past medical history significant for coronary artery disease post CABG and PCI, heart failure [place]atties mellitus comes into the hospital for hypoglycemia. Patient has been vomiting having diarrhea for the past 2 days, he relates he has not been taking any of his diabetes medication Metformin or glyburide for the past 2 days  Discharge Diagnoses:  Principal Problem:   Hypoglycemia Active Problems:   CAD (coronary artery disease)   Hypertension   Hypothyroidism   Hyperlipidemia   GERD (gastroesophageal reflux disease)   AKI (acute kidney injury) (HCC)   Chronic anemia   CKD (chronic kidney disease) stage 3, GFR 30-59 ml/min (HCC)   Hypokalemia   BPH (benign prostatic hyperplasia)   Viral gastroenteritis Hypoglycemia: In the setting of long-acting sulfonylurea with new acute kidney injury probably prolonging the half-life of the sulfonylurea. He was started on D5 and his blood glucose improved. His oral hypoglycemic agents were held.  She will resume these as an outpatient on 06/09/2018.  Viral gastroenteritis: He remained afebrile with no leukocytosis CT scan of the abdomen was negative for any acute abnormalities. The viral GI panel was negative for viral infection. He was tolerating his diet no further diarrhea it resolved  Acute kidney injury on chronic kidney disease stage III: With a baseline creatinine of 1.1 on admission of 1.9 likely prerenal azotemia in the setting of ongoing  diarrhea nausea and vomiting this resolved with IV fluids his creatinine is now back to baseline.  Chronic anemia: Hemoglobin has remained stable.  Hypokalemia: Likely due to nausea vomiting and diarrhea this resolved with oral repletion. His mag was checked was 1.5 and it was repleted orally.  History of CAD: No chest pain continue aspirin and Plavix, Imdur metoprolol.  Essential hypertension: Continue home meds no changes were made.   Discharge Instructions  Discharge Instructions    Diet - low sodium heart healthy   Complete by:  As directed    Increase activity slowly   Complete by:  As directed      Allergies as of 06/08/2018      Reactions   Codeine Nausea Only   Pt reports this happens off and on      Medication List    TAKE these medications   acetaminophen 500 MG tablet Commonly known as:  TYLENOL Take 1,000 mg by mouth as needed for mild pain.   aspirin 81 MG tablet Take 1 tablet (81 mg total) by mouth daily.   atorvastatin 40 MG tablet Commonly known as:  LIPITOR TAKE 1 TABLET BY MOUTH  DAILY   clopidogrel 75 MG tablet Commonly known as:  PLAVIX TAKE 1 TABLET BY MOUTH  DAILY   fluocinonide 0.05 % external solution Commonly known as:  LIDEX Apply 1 mL topically 2 (two) times daily as needed (dryness and itching).   glimepiride 4 MG tablet Commonly known as:  AMARYL Take 4 mg by mouth 2 (two) times daily.   hydrochlorothiazide 25 MG tablet Commonly known as:  HYDRODIURIL Take 1 tablet (25 mg total) by mouth daily.   isosorbide mononitrate 120 MG 24  hr tablet Commonly known as:  IMDUR Take 1 tablet (120 mg total) by mouth daily.   KLOR-CON 10 10 MEQ tablet Generic drug:  potassium chloride TAKE 1 TABLET BY MOUTH EVERY DAY   levothyroxine 100 MCG tablet Commonly known as:  SYNTHROID, LEVOTHROID Take 100 mcg by mouth daily before breakfast.   lisinopril 10 MG tablet Commonly known as:  PRINIVIL,ZESTRIL Take 1 tablet (10 mg total) by  mouth daily.   metFORMIN 1000 MG tablet Commonly known as:  GLUCOPHAGE Take 1,000 mg by mouth 2 (two) times daily with a meal. Reports taking 1000 mg in the morning and 500 mg in the evening.   metoprolol tartrate 25 MG tablet Commonly known as:  LOPRESSOR TAKE 1 TABLET BY MOUTH TWO  TIMES DAILY What changed:  how much to take   ondansetron 4 MG disintegrating tablet Commonly known as:  ZOFRAN-ODT Take 1 tablet (4 mg total) by mouth every 8 (eight) hours as needed. What changed:  reasons to take this   pantoprazole 40 MG tablet Commonly known as:  PROTONIX Take 1 tablet (40 mg total) by mouth 2 (two) times daily.   phenazopyridine 200 MG tablet Commonly known as:  PYRIDIUM Take 1 tablet (200 mg total) by mouth 3 (three) times daily.   SYSTANE FREE OP Place 1 drop into both eyes 3 (three) times daily as needed (for dry eyes.).   tamsulosin 0.4 MG Caps capsule Commonly known as:  FLOMAX Take 0.4 mg by mouth daily.       Allergies  Allergen Reactions  . Codeine Nausea Only    Pt reports this happens off and on    Consultations:  None   Procedures/Studies: Ct Abdomen Pelvis Wo Contrast  Result Date: 06/06/2018 CLINICAL DATA:  Nausea, vomiting, and diarrhea. Abdominal distension. Personal history of prostate cancer. EXAM: CT ABDOMEN AND PELVIS WITHOUT CONTRAST TECHNIQUE: Multidetector CT imaging of the abdomen and pelvis was performed following the standard protocol without IV contrast. COMPARISON:  None. FINDINGS: Lower chest: The lung bases are clear without focal nodule, mass, or airspace disease. Heart is mildly enlarged. Extensive coronary artery calcifications are present. Calcifications are also present at the aortic valve. There are calcifications in the descending and thoracic aorta. No significant pleural or pericardial effusion is present. Hepatobiliary: No focal liver abnormality is seen. No gallstones, gallbladder wall thickening, or biliary dilatation.  Pancreas: Unremarkable. No pancreatic ductal dilatation or surrounding inflammatory changes. Spleen: Normal in size without focal abnormality. Adrenals/Urinary Tract: Adrenal glands are normal bilaterally. There is stranding about both kidneys without significant dilation. Exophytic cyst of the right kidney is stable from 02/07/18 and 08/08/2014. No other renal lesions are present. The urinary bladder is within normal limits. Stomach/Bowel: The stomach is mildly distended. There is no obstruction or mass lesion. The duodenum is normal. Small bowel is unremarkable. The ascending and transverse colon are within normal limits. Diverticular changes are present in the distal descending and sigmoid colon without focal inflammation. Vascular/Lymphatic: Atherosclerotic calcifications are present in the aorta and branch vessels. Infrarenal abdominal aortic aneurysm measures up to 3.6 cm. This is not changed significantly since 2018. No significant retroperitoneal adenopathy is present. Reproductive: Prostate is unremarkable. Other: Multilevel degenerative changes of the lumbar spine are again noted. No focal lytic or blastic lesions are present. Pelvis is intact. The hips are located and within normal limits. Musculoskeletal: Slight degenerative retrolisthesis is present at L3-4 and L4-5. Levoconvex curvature is centered at the thoracolumbar junction. IMPRESSION: 1. No acute or  focal lesion to explain the patient's symptoms. 2. New stranding about both kidneys. 3. Stable exophytic cyst of the right kidney. 4. Degenerative disc disease is most evident at L3-4 and L4-5 Electronically Signed   By: San Morelle M.D.   On: 06/06/2018 23:59    Subjective:  No complains Discharge Exam: Vitals:   06/08/18 0536 06/08/18 0816  BP: 111/72 140/71  Pulse: 76 79  Resp: 15   Temp: 98.2 F (36.8 C)   SpO2: 98% 99%   Vitals:   06/07/18 1434 06/07/18 2045 06/08/18 0536 06/08/18 0816  BP: 116/72 (!) 114/58 111/72  140/71  Pulse: 90 97 76 79  Resp: 20  15   Temp: 97.7 F (36.5 C) 98.3 F (36.8 C) 98.2 F (36.8 C)   TempSrc: Axillary Oral Oral   SpO2: 96% 96% 98% 99%  Weight:      Height:        General: Pt is alert, awake, not in acute distress Cardiovascular: RRR, S1/S2 +, no rubs, no gallops Respiratory: CTA bilaterally, no wheezing, no rhonchi Abdominal: Soft, NT, ND, bowel sounds + Extremities: no edema, no cyanosis    The results of significant diagnostics from this hospitalization (including imaging, microbiology, ancillary and laboratory) are listed below for reference.     Microbiology: Recent Results (from the past 240 hour(s))  Gastrointestinal Panel by PCR , Stool     Status: None   Collection Time: 06/06/18 11:16 PM  Result Value Ref Range Status   Campylobacter species NOT DETECTED NOT DETECTED Final   Plesimonas shigelloides NOT DETECTED NOT DETECTED Final   Salmonella species NOT DETECTED NOT DETECTED Final   Yersinia enterocolitica NOT DETECTED NOT DETECTED Final   Vibrio species NOT DETECTED NOT DETECTED Final   Vibrio cholerae NOT DETECTED NOT DETECTED Final   Enteroaggregative E coli (EAEC) NOT DETECTED NOT DETECTED Final   Enteropathogenic E coli (EPEC) NOT DETECTED NOT DETECTED Final   Enterotoxigenic E coli (ETEC) NOT DETECTED NOT DETECTED Final   Shiga like toxin producing E coli (STEC) NOT DETECTED NOT DETECTED Final   Shigella/Enteroinvasive E coli (EIEC) NOT DETECTED NOT DETECTED Final   Cryptosporidium NOT DETECTED NOT DETECTED Final   Cyclospora cayetanensis NOT DETECTED NOT DETECTED Final   Entamoeba histolytica NOT DETECTED NOT DETECTED Final   Giardia lamblia NOT DETECTED NOT DETECTED Final   Adenovirus F40/41 NOT DETECTED NOT DETECTED Final   Astrovirus NOT DETECTED NOT DETECTED Final   Norovirus GI/GII NOT DETECTED NOT DETECTED Final   Rotavirus A NOT DETECTED NOT DETECTED Final   Sapovirus (I, II, IV, and V) NOT DETECTED NOT DETECTED Final     Comment: Performed at [facility], [address] [place],  [address]     Labs: BNP (last 3 results) No results for input(s): BNP in the last 8760 hours. Basic Metabolic Panel: Recent Labs  Lab 06/06/18 2029 06/07/18 0543 06/08/18 0459  NA 137 138 137  K 3.2* 3.6 4.2  CL 106 110 111  CO2 21* 22 21*  GLUCOSE 47* 86 134*  BUN 56* 43* 25*  CREATININE 1.96* 1.60* 1.22  CALCIUM 8.2* 8.2* 8.0*  MG 1.5*  --  1.9   Liver Function Tests: Recent Labs  Lab 06/06/18 2029  AST 15  ALT 18  ALKPHOS 48  BILITOT 0.7  PROT 6.2*  ALBUMIN 3.0*   Recent Labs  Lab 06/06/18 2029  LIPASE 22   No results for input(s): AMMONIA in the last 168 hours. CBC:  Recent Labs  Lab 06/06/18 2029  WBC 7.5  NEUTROABS 5.3  HGB 12.7*  HCT 40.0  MCV 80.5  PLT 173   Cardiac Enzymes: No results for input(s): CKTOTAL, CKMB, CKMBINDEX, TROPONINI in the last 168 hours. BNP: Invalid input(s): POCBNP CBG: Recent Labs  Lab 06/08/18 0029 06/08/18 0246 06/08/18 0532 06/08/18 0658 06/08/18 0914  GLUCAP 135* 140* 122* 126* 133*   D-Dimer No results for input(s): DDIMER in the last 72 hours. Hgb A1c No results for input(s): HGBA1C in the last 72 hours. Lipid Profile No results for input(s): CHOL, HDL, LDLCALC, TRIG, CHOLHDL, LDLDIRECT in the last 72 hours. Thyroid function studies No results for input(s): TSH, T4TOTAL, T3FREE, THYROIDAB in the last 72 hours.  Invalid input(s): FREET3 Anemia work up No results for input(s): VITAMINB12, FOLATE, FERRITIN, TIBC, IRON, RETICCTPCT in the last 72 hours. Urinalysis    Component Value Date/Time   COLORURINE YELLOW 02/07/2018 1907   APPEARANCEUR CLEAR 02/07/2018 1907   LABSPEC 1.017 02/07/2018 1907   PHURINE 5.0 02/07/2018 1907   GLUCOSEU NEGATIVE 02/07/2018 1907   HGBUR NEGATIVE 02/07/2018 [place] NEGATIVE 02/07/2018 [place] NEGATIVE 02/07/2018 1907   PROTEINUR NEGATIVE 02/07/2018 1907   NITRITE NEGATIVE  02/07/2018 1907   LEUKOCYTESUR NEGATIVE 02/07/2018 1907   Sepsis Labs Invalid input(s): PROCALCITONIN,  WBC,  LACTICIDVEN Microbiology Recent Results (from the past 240 hour(s))  Gastrointestinal Panel by PCR , Stool     Status: None   Collection Time: 06/06/18 11:16 PM  Result Value Ref Range Status   Campylobacter species NOT DETECTED NOT DETECTED Final   Plesimonas shigelloides NOT DETECTED NOT DETECTED Final   Salmonella species NOT DETECTED NOT DETECTED Final   Yersinia enterocolitica NOT DETECTED NOT DETECTED Final   Vibrio species NOT DETECTED NOT DETECTED Final   Vibrio cholerae NOT DETECTED NOT DETECTED Final   Enteroaggregative E coli (EAEC) NOT DETECTED NOT DETECTED Final   Enteropathogenic E coli (EPEC) NOT DETECTED NOT DETECTED Final   Enterotoxigenic E coli (ETEC) NOT DETECTED NOT DETECTED Final   Shiga like toxin producing E coli (STEC) NOT DETECTED NOT DETECTED Final   Shigella/Enteroinvasive E coli (EIEC) NOT DETECTED NOT DETECTED Final   Cryptosporidium NOT DETECTED NOT DETECTED Final   Cyclospora cayetanensis NOT DETECTED NOT DETECTED Final   Entamoeba histolytica NOT DETECTED NOT DETECTED Final   Giardia lamblia NOT DETECTED NOT DETECTED Final   Adenovirus F40/41 NOT DETECTED NOT DETECTED Final   Astrovirus NOT DETECTED NOT DETECTED Final   Norovirus GI/GII NOT DETECTED NOT DETECTED Final   Rotavirus A NOT DETECTED NOT DETECTED Final   Sapovirus (I, II, IV, and V) NOT DETECTED NOT DETECTED Final    Comment: Performed at [facility], [address], [place], [place] [address]     Time coordinating discharge: 40 minutes  SIGNED:   Charlynne Cousins, MD  [facility] 06/08/2018, 9:29 AM Pager   If 7PM-7AM, please contact night-coverage [email or web address] Password [email or web address]

## 2018-06-08 NOTE — Evaluation (Signed)
Physical Therapy Evaluation Patient Details Name: Benjamin Harrison MRN: [id number] DOB: 07-04-1935 Today's Date: 06/08/2018   History of Present Illness  83 yo male with onset of hypoglycemia after having viral gastroenteritis was admitted.  Had K+ repleted, elevated creatinine resolved.  PT to assess gait and noted pt at baseline.  PMHx:  CAD, CABG, DM, HTN, CHF, scoliosis, DDD L3-4, L 4-5  Clinical Impression  Pt was assisted to walk in room and demonstrates what is clearly his near PLOF with gait and balance.  Has no strength deficits and is able to use a rolling IV pole as his SPC to allow progression of safe gait in the room.  Follow up with PCP if he feels therapy is warranted once home but PT will follow acutely to see that he is working on endurance on [facility] for restoration of comfort with home activity as prior to this admission.    Follow Up Recommendations No PT follow up    Equipment Recommendations  None recommended by PT    Recommendations for Other Services       Precautions / Restrictions Precautions Precautions: Fall Restrictions Weight Bearing Restrictions: No      Mobility  Bed Mobility Overal bed mobility: Modified Independent                Transfers Overall transfer level: Modified independent Equipment used: None(pt pushed IV pole)             General transfer comment: pt uses correct hand placement without cues  Ambulation/Gait Ambulation/Gait assistance: Min guard(for safety) Gait Distance (Feet): 60 Feet(walked in the room due to precautions) Assistive device: IV Pole Gait Pattern/deviations: Step-through pattern;Wide base of support;Decreased stride length Gait velocity: reduced Gait velocity interpretation: <1.31 ft/sec, indicative of household ambulator General Gait Details: gait may have been impacted by navigating on IV pole in a confined space  Stairs            Wheelchair Mobility    Modified Rankin (Stroke Patients  Only)       Balance Overall balance assessment: Needs assistance Sitting-balance support: Feet supported Sitting balance-Leahy Scale: Good     Standing balance support: Single extremity supported Standing balance-Leahy Scale: Fair                               Pertinent Vitals/Pain Pain Assessment: Faces Faces Pain Scale: Hurts little more Pain Location: R arm at IV site, icing when PT arrived Pain Descriptors / Indicators: Operative site guarding Pain Intervention(s): Monitored during session    Home Living Family/patient expects to be discharged to:: Private residence Living Arrangements: Spouse/significant other Available Help at Discharge: Family;Available 24 hours/day Type of Home: House Home Access: Stairs to enter Entrance Stairs-Rails: None Entrance Stairs-Number of Steps: 1 Home Layout: One level Home Equipment: Walker - 2 wheels;Cane - single point(SPC was PLOF)      Prior Function Level of Independence: Independent with assistive device(s)         Comments: pt was out cutting lawn per his report     Hand Dominance   Dominant Hand: Right    Extremity/Trunk Assessment   Upper Extremity Assessment Upper Extremity Assessment: Overall WFL for tasks assessed    Lower Extremity Assessment Lower Extremity Assessment: Overall WFL for tasks assessed    Cervical / Trunk Assessment Cervical / Trunk Assessment: Normal  Communication   Communication: No difficulties  Cognition Arousal/Alertness: Awake/alert Behavior During Therapy:  WFL for tasks assessed/performed Overall Cognitive Status: Within Functional Limits for tasks assessed                                        General Comments      Exercises     Assessment/Plan    PT Assessment Patient needs continued PT services  PT Problem List Decreased activity tolerance;Decreased balance;Decreased mobility;Decreased coordination;Decreased safety awareness       PT  Treatment Interventions DME instruction;Gait training;Functional mobility training;Therapeutic activities;Therapeutic exercise;Balance training;Neuromuscular re-education;Stair training;Patient/family education    PT Goals (Current goals can be found in the Care Plan section)  Acute Rehab PT Goals Patient Stated Goal: to get home and feel better PT Goal Formulation: With patient/family Time For Goal Achievement: 06/15/18 Potential to Achieve Goals: Good    Frequency Min 2X/week   Barriers to discharge Inaccessible home environment one step to enter with no rail    Co-evaluation               AM-PAC PT "6 Clicks" Daily Activity  Outcome Measure Difficulty turning over in bed (including adjusting bedclothes, sheets and blankets)?: None Difficulty moving from lying on back to sitting on the side of the bed? : A Little Difficulty sitting down on and standing up from a chair with arms (e.g., wheelchair, bedside commode, etc,.)?: A Little Help needed moving to and from a bed to chair (including a wheelchair)?: A Little Help needed walking in hospital room?: A Little Help needed climbing 3-5 steps with a railing? : A Little 6 Click Score: 19    End of Session Equipment Utilized During Treatment: Gait belt Activity Tolerance: Patient tolerated treatment well;Patient limited by fatigue Patient left: in bed;with call bell/phone within reach;with family/visitor present Nurse Communication: Mobility status PT Visit Diagnosis: History of falling (Z91.81)    Time: 0600-4599 PT Time Calculation (min) (ACUTE ONLY): 18 min   Charges:   PT Evaluation $PT Eval Moderate Complexity: 1 Mod          Ramond Dial 06/08/2018, 11:11 AM   Mee Hives, PT MS Acute Rehab Dept. Number: [place] and [place]

## 2018-06-12 ENCOUNTER — Emergency Department (HOSPITAL_COMMUNITY)
Admission: EM | Admit: 2018-06-12 | Discharge: 2018-06-13 | Disposition: A | Discharge status: Home / Self Care | Payer: Medicare Other | Attending: Emergency Medicine | Admitting: Emergency Medicine

## 2018-06-12 ENCOUNTER — Encounter (HOSPITAL_COMMUNITY): Payer: Self-pay

## 2018-06-12 ENCOUNTER — Other Ambulatory Visit: Payer: Self-pay

## 2018-06-12 ENCOUNTER — Emergency Department (HOSPITAL_COMMUNITY): Payer: Medicare Other

## 2018-06-12 DIAGNOSIS — R0602 Shortness of breath: Secondary | ICD-10-CM | POA: Insufficient documentation

## 2018-06-12 DIAGNOSIS — I509 Heart failure, unspecified: Secondary | ICD-10-CM | POA: Diagnosis not present

## 2018-06-12 DIAGNOSIS — Z951 Presence of aortocoronary bypass graft: Secondary | ICD-10-CM | POA: Insufficient documentation

## 2018-06-12 DIAGNOSIS — Z8546 Personal history of malignant neoplasm of prostate: Secondary | ICD-10-CM | POA: Insufficient documentation

## 2018-06-12 DIAGNOSIS — I10 Essential (primary) hypertension: Secondary | ICD-10-CM | POA: Diagnosis not present

## 2018-06-12 DIAGNOSIS — E039 Hypothyroidism, unspecified: Secondary | ICD-10-CM | POA: Diagnosis not present

## 2018-06-12 DIAGNOSIS — I2510 Atherosclerotic heart disease of native coronary artery without angina pectoris: Secondary | ICD-10-CM | POA: Diagnosis not present

## 2018-06-12 DIAGNOSIS — M79662 Pain in left lower leg: Secondary | ICD-10-CM | POA: Insufficient documentation

## 2018-06-12 DIAGNOSIS — M7989 Other specified soft tissue disorders: Secondary | ICD-10-CM

## 2018-06-12 DIAGNOSIS — R2242 Localized swelling, mass and lump, left lower limb: Secondary | ICD-10-CM | POA: Insufficient documentation

## 2018-06-12 DIAGNOSIS — Z85828 Personal history of other malignant neoplasm of skin: Secondary | ICD-10-CM | POA: Diagnosis not present

## 2018-06-12 DIAGNOSIS — E119 Type 2 diabetes mellitus without complications: Secondary | ICD-10-CM | POA: Insufficient documentation

## 2018-06-12 DIAGNOSIS — I252 Old myocardial infarction: Secondary | ICD-10-CM | POA: Diagnosis not present

## 2018-06-12 DIAGNOSIS — Z87891 Personal history of nicotine dependence: Secondary | ICD-10-CM | POA: Diagnosis not present

## 2018-06-12 DIAGNOSIS — Z79899 Other long term (current) drug therapy: Secondary | ICD-10-CM | POA: Diagnosis not present

## 2018-06-12 LAB — COMPREHENSIVE METABOLIC PANEL
ALT: 32 U/L (ref 0–44)
AST: 25 U/L (ref 15–41)
Albumin: 2.7 g/dL — ABNORMAL LOW (ref 3.5–5.0)
Alkaline Phosphatase: 80 U/L (ref 38–126)
Anion gap: 8 (ref 5–15)
BUN: 19 mg/dL (ref 8–23)
CO2: 28 mmol/L (ref 22–32)
Calcium: 8.2 mg/dL — ABNORMAL LOW (ref 8.9–10.3)
Chloride: 105 mmol/L (ref 98–111)
Creatinine, Ser: 1.1 mg/dL (ref 0.61–1.24)
GFR calc Af Amer: >60 mL/min (ref >60)
Glucose, Bld: 212 mg/dL — ABNORMAL HIGH (ref 70–99)
Potassium: 3.4 mmol/L — ABNORMAL LOW (ref 3.5–5.1)
SODIUM: 141 mmol/L (ref 135–145)
Total Bilirubin: 0.6 mg/dL (ref 0.3–1.2)
Total Protein: 6.1 g/dL — ABNORMAL LOW (ref 6.5–8.1)

## 2018-06-12 LAB — CBC
HCT: 33.5 % — ABNORMAL LOW (ref 39.0–52.0)
HEMOGLOBIN: 10.8 g/dL — AB (ref 13.0–17.0)
MCH: 25.8 pg — ABNORMAL LOW (ref 26.0–34.0)
MCHC: 32.2 g/dL (ref 30.0–36.0)
MCV: 80 fL (ref 80.0–100.0)
PLATELETS: 214 10*3/uL (ref 150–400)
RBC: 4.19 MIL/uL — ABNORMAL LOW (ref 4.22–5.81)
RDW: 14.8 % (ref 11.5–15.5)
WBC: 7.9 10*3/uL (ref 4.0–10.5)
nRBC: 0 % (ref 0.0–0.2)

## 2018-06-12 LAB — TROPONIN I

## 2018-06-12 LAB — BRAIN NATRIURETIC PEPTIDE: B NATRIURETIC PEPTIDE 5: 38 pg/mL (ref 0.0–100.0)

## 2018-06-12 NOTE — ED Triage Notes (Addendum)
Patient reports that he has been having left lower leg pain x 3 days and today he had left lower leg swelling.  Patient currently taking Plavix and Aspirin. Patient denies SOB.

## 2018-06-12 NOTE — ED Provider Notes (Signed)
[facility] DEPT Provider Note   CSN: [id number] Arrival date & time: 06/12/18  1846     History   Chief Complaint Chief Complaint  Patient presents with  . Leg Pain  . Leg Swelling    HPI Benjamin Harrison is a 83 y.o. male with a h/o of Stage T2c adenocarcinoma prostate CA, AAA, CHF, CAD s/p CABG, GERD, HLD, HTN, hypothyroidism, and DM Type II who presents to the emergency department with a chief complaint of left leg pain and swelling.  The patient endorses constant pain in his left leg that began approximately 3 days ago, worsening since onset.  States he felt like the pain began in the left heel, but radiates up the left calf.  He reports associated swelling and redness to the left ankle and calf that began today.  He reports he has had difficulty walking with his cane since his symptoms of worsened.   He reports a history of chronic dyspnea at baseline, but family notes he has seemed more short of breath today.  He also endorses "pressure" in the middle of his chest that radiates to his back and shoulder blade. Pain has been constant since earlier today.  Reports he has had similar recurrent episodes of similar discomfort since he had his CABG, but states that he has not had any episodes prior to today and at least a week.  He was recently hospitalized from November 7-9 for hypoglycemia after he presented to the ED with nausea, vomiting, diarrhea.  He states he did not wear SCDs while he was admitted.  Family reports that he really has not gotten out of bed in the last 2 weeks due to not feeling well.  He denies palpitations, wheezing, URI symptoms, fever, chills, abdominal pain, nausea, vomiting, or rash, weakness, or numbness.  Reports that he has an appointment with Dr. Gloriann Loan at the cancer center tomorrow at 7 AM for blood work.  The history is provided by the patient. No language interpreter was used.    Past Medical History:  Diagnosis Date    . AAA (abdominal aortic aneurysm) ([place])   . CHF (congestive heart failure) ([place])   . Coronary artery disease    a. CABG 2001 with early failure of VG-RCA. b. Inf MI after Lexiscan 01/28/2009 s/p RCA stent. c. Stent to dRCA 02/24/09. d. DES to [facility]  2012. e. NSTEMI s/p angioplasty for ISR of RCA 09/2011. f. 05/2012 Cath: LM 20-30p, LAD 100p, LCX min irregs, RCA patent prox stent, 29m ISR, VG->Diag nl w/ dzs in D1 prox to graft, VG->RCA 100, VG->OM nl, LIMA->LAD nl, Nl EF->Med Rx; g. 09/2014 low-risk MV.  Marland Kitchen Dyspnea   . GERD (gastroesophageal reflux disease)   . History of echocardiogram    a. 09/2014 Echo: EF 55-60%, mild basal-midinferior HK, triv AI.  Marland Kitchen Hyperlipidemia   . Hypertension   . Hypertensive heart disease   . Hypothyroidism   . Myocardial infarction ([place])   . Osteoarthritis    a.End-stage osteoarthritis, right knee, medial compartment  . Personal history of tobacco use   . Prostate cancer ([place])   . Skin cancer    melanoma  . Type II diabetes mellitus [facility])     Patient Active Problem List   Diagnosis Date Noted  . Viral gastroenteritis 06/07/2018  . Hypoglycemia 06/06/2018  . AKI (acute kidney injury) ([place]) 06/06/2018  . Chronic anemia 06/06/2018  . CKD (chronic kidney disease) stage 3, GFR 30-59 ml/min (HCC)  06/06/2018  . Hypokalemia 06/06/2018  . BPH (benign prostatic hyperplasia) 06/06/2018  . Malignant neoplasm of prostate ([place]) 06/04/2018  . Intractable nausea and vomiting 01/13/2018  . S/P PTCA (percutaneous transluminal coronary angioplasty) 11/04/2017  . Angina pectoris ([place]) 10/15/2017  . Abdominal aortic aneurysm (AAA) without rupture ([place]) 08/10/2016  . Acute on chronic kidney failure-II 08/09/2016  . GERD (gastroesophageal reflux disease) 08/08/2016  . Gastroenteritis 08/08/2016  . Sepsis ([place]) 08/08/2016  . Cough 08/08/2016  . Hypertensive heart disease   . Type II diabetes mellitus ([place])   . Thrombocytopenia ([place]) 06/08/2012  . NSTEMI (non-ST  elevated myocardial infarction) ([place]) 10/09/2011  . Unstable angina ([place]) 10/08/2011  . Colitis 07/06/2011  . Diabetes mellitus ([place]) 07/05/2011  . Diarrhea 07/05/2011  . Weakness generalized 07/05/2011  . Fatigue 11/11/2010  . CAD (coronary artery disease)   . Hypertension   . Hypothyroidism   . Hyperlipidemia   . SOB (shortness of breath) on exertion   . Personal history of tobacco use     Past Surgical History:  Procedure Laterality Date  . CARDIAC CATHETERIZATION    . CARDIAC CATHETERIZATION N/A 12/06/2015   Procedure: Left Heart Cath and Cors/Grafts Angiography;  Surgeon: Burnell Blanks, MD;  Location: [facility] CV LAB;  Service: Cardiovascular;  Laterality: N/A;  . CARDIAC CATHETERIZATION N/A 12/06/2015   Procedure: Coronary Balloon Angioplasty;  Surgeon: Burnell Blanks, MD;  Location: [facility] LAB;  Service: Cardiovascular;  Laterality: N/A;  . CATARACT EXTRACTION    . CORONARY ARTERY BYPASS GRAFT    . EYE SURGERY    . HERNIA REPAIR  1985  . INTRAVASCULAR ULTRASOUND/IVUS N/A 10/15/2017   Procedure: Intravascular Ultrasound/IVUS;  Surgeon: Belva Crome, MD;  Location: [facility] LAB;  Service: Cardiovascular;  Laterality: N/A;  . KNEE ARTHROSCOPY  1999 and 2004  . LEFT HEART CATH AND CORS/GRAFTS ANGIOGRAPHY N/A 10/15/2017   Procedure: LEFT HEART CATH AND CORS/GRAFTS ANGIOGRAPHY;  Surgeon: Belva Crome, MD;  Location: [facility] LAB;  Service: Cardiovascular;  Laterality: N/A;  . LEFT HEART CATHETERIZATION WITH CORONARY ANGIOGRAM N/A 10/09/2011   Procedure: LEFT HEART CATHETERIZATION WITH CORONARY ANGIOGRAM;  Surgeon: Peter M Martinique, MD;  Location: [facility] CATH LAB;  Service: Cardiovascular;  Laterality: N/A;  . LEFT HEART CATHETERIZATION WITH CORONARY/GRAFT ANGIOGRAM N/A 06/06/2012   Procedure: LEFT HEART CATHETERIZATION WITH Beatrix Fetters;  Surgeon: Peter M Martinique, MD;  Location: [facility] CATH LAB;  Service: Cardiovascular;  Laterality: N/A;         Home Medications    Prior to Admission medications   Medication Sig Start Date End Date Taking? Authorizing Provider  acetaminophen (TYLENOL) 500 MG tablet Take 1,000 mg by mouth as needed for mild pain.   Yes [provider]  aspirin 81 MG tablet Take 1 tablet (81 mg total) by mouth daily. 10/10/11  Yes [name], NP  atorvastatin (LIPITOR) 40 MG tablet TAKE 1 TABLET BY MOUTH  DAILY Patient taking differently: Take 40 mg by mouth at bedtime.  05/29/17  Yes [name], [name], MD  clopidogrel (PLAVIX) 75 MG tablet TAKE 1 TABLET BY MOUTH  DAILY Patient taking differently: Take 75 mg by mouth daily.  05/29/17  Yes [name], [name], MD  fluocinonide (LIDEX) 0.05 % external solution Apply 1 mL topically 2 (two) times daily as needed (dryness and itching).  12/24/17  Yes [provider]  hydrochlorothiazide (HYDRODIURIL) 25 MG tablet Take 1 tablet (25 mg total) by mouth daily. Patient  taking differently: Take 25 mg by mouth at bedtime.  12/19/13  Yes [name], [name], MD  isosorbide mononitrate (IMDUR) 120 MG 24 hr tablet Take 1 tablet (120 mg total) by mouth daily. Patient taking differently: Take 120 mg by mouth at bedtime.  10/16/17  Yes [name], [name], PA-C  KLOR-CON 10 10 MEQ tablet TAKE 1 TABLET BY MOUTH EVERY DAY Patient taking differently: Take 10 mEq by mouth daily.  02/06/18  Yes [name], [name], MD  levothyroxine (SYNTHROID, LEVOTHROID) 100 MCG tablet Take 100 mcg by mouth daily before breakfast.    Yes [provider]  lisinopril (PRINIVIL,ZESTRIL) 10 MG tablet Take 1 tablet (10 mg total) by mouth daily. 10/11/17 07/08/18 Yes Bhagat, Bhavinkumar, PA  metoprolol tartrate (LOPRESSOR) 25 MG tablet TAKE 1 TABLET BY MOUTH TWO  TIMES DAILY Patient taking differently: Take 25 mg by mouth 2 (two) times daily.  05/29/17  Yes [name], [name], MD  ondansetron (ZOFRAN ODT) 4 MG disintegrating tablet Take 1 tablet (4 mg total) by mouth every 8  (eight) hours as needed. Patient taking differently: Take 4 mg by mouth every 8 (eight) hours as needed for nausea or vomiting.  02/07/18  Yes [name], MD  pantoprazole (PROTONIX) 40 MG tablet Take 1 tablet (40 mg total) by mouth 2 (two) times daily. 01/14/18  Yes Neva Seat, MD  Polyethyl Glycol-Propyl Glycol (SYSTANE FREE OP) Place 1 drop into both eyes 3 (three) times daily as needed (for dry eyes.).    Yes [provider]  tamsulosin (FLOMAX) 0.4 MG CAPS capsule Take 0.4 mg by mouth daily. 05/26/18  Yes [provider]    Family History Family History  Problem Relation Age of Onset  . Hypertension Mother   . Unexplained death Mother   . Throat cancer Sister   . Lung cancer Sister   . Heart disease Brother   . Pneumonia Brother   . Heart disease Brother   . Prostate cancer Brother   . Unexplained death Brother        at birth  . Unexplained death Brother   . Prostate cancer Son   . Lung cancer Son   . Prostate cancer Other     Social History Social History   Tobacco Use  . Smoking status: Former Smoker    Packs/day: 1.00    Years: 20.00    Pack years: 20.00    Types: Cigarettes    Last attempt to quit: 08/01/1975    Years since quitting: 42.8  . Smokeless tobacco: Never Used  Substance Use Topics  . Alcohol use: No  . Drug use: No     Allergies   Codeine   Review of Systems Review of Systems  Constitutional: Negative for appetite change, chills, fever and unexpected weight change.  Eyes: Negative for visual disturbance.  Respiratory: Positive for shortness of breath. Negative for cough and wheezing.   Cardiovascular: Positive for chest pain and leg swelling. Negative for palpitations.  Gastrointestinal: Positive for diarrhea. Negative for abdominal pain, nausea and vomiting.  Genitourinary: Negative for dysuria.  Musculoskeletal: Positive for gait problem. Negative for back pain.  Skin: Negative for rash.  Allergic/Immunologic:  Negative for immunocompromised state.  Neurological: Negative for syncope, weakness, numbness and headaches.  Psychiatric/Behavioral: Negative for confusion.    Physical Exam Updated Vital Signs BP (!) 151/77   Pulse 74   Temp 98.7 F (37.1 C) (Oral)   Resp 17   Ht 5\' 6"  (1.676 m)  Wt 72.6 kg   SpO2 97%   BMI 25.82 kg/m   Physical Exam  Constitutional: He appears well-developed.  HENT:  Head: Normocephalic.  Eyes: Conjunctivae are normal.  Neck: Neck supple.  Cardiovascular: Normal rate and regular rhythm.  No murmur heard. Pulmonary/Chest: Effort normal. No stridor. No respiratory distress. He has no wheezes. He has no rales.  Lung sounds are diminished bilaterally.  The patient begins coughing with deep inspiration.  Cough is nonproductive.  Lungs are otherwise clear to auscultation bilaterally.  Abdominal: Soft. He exhibits no distension and no mass. There is tenderness. There is no rebound and no guarding. No hernia.  Abdomen is distended but soft.  No tenderness to palpation.  Musculoskeletal: He exhibits edema and tenderness. He exhibits no deformity.  Erythema and edema to the left ankle and calf. +Homan sign.  Significant pain with dorsiflexion.  No edema or erythema noted to the calf.  Tender to palpation over the left medial malleolus.  Left lateral malleolus is nontender.  Achilles tendon is intact.  DP pulses are 2+ and symmetric.  Sensation is intact and equal throughout the bilateral lower extremities.  Neurological: He is alert.  Skin: Skin is warm and dry.  Psychiatric: His behavior is normal.  Nursing note and vitals reviewed.   ED Treatments / Results  Labs (all labs ordered are listed, but only abnormal results are displayed) Labs Reviewed  COMPREHENSIVE METABOLIC PANEL - Abnormal; Notable for the following components:      Result Value   Potassium 3.4 (*)    Glucose, Bld 212 (*)    Calcium 8.2 (*)    Total Protein 6.1 (*)    Albumin 2.7 (*)    All  other components within normal limits  CBC - Abnormal; Notable for the following components:   RBC 4.19 (*)    Hemoglobin 10.8 (*)    HCT 33.5 (*)    MCH 25.8 (*)    All other components within normal limits  TROPONIN I  BRAIN NATRIURETIC PEPTIDE    EKG EKG Interpretation  Date/Time:  Wednesday June 12 2018 23:09:07 EST Ventricular Rate:  75 PR Interval:    QRS Duration: 99 QT Interval:  385 QTC Calculation: 430 R Axis:   12 Text Interpretation:  Sinus rhythm No significant change since last tracing Confirmed by Gareth Morgan [phone]) on 06/12/2018 11:11:00 PM Also confirmed by Gareth Morgan [phone]), editor Hattie Perch ([id number])  on 06/13/2018 7:02:45 AM   Radiology Dg Ankle Complete Left  Result Date: 06/13/2018 CLINICAL DATA:  Pain without trauma. EXAM: LEFT ANKLE COMPLETE - 3+ VIEW COMPARISON:  None FINDINGS: Soft tissue swelling, particularly medially. No underlying bony lesion or abnormality noted. IMPRESSION: Soft tissue swelling.  No fracture. Electronically Signed   By: Dorise Bullion III M.D   On: 06/13/2018 00:48   Ct Angio Chest Pe W And/or Wo Contrast  Result Date: 06/13/2018 CLINICAL DATA:  Acute onset of left lower leg pain and swelling. Shortness of breath. Patient on Plavix and aspirin. EXAM: CT ANGIOGRAPHY CHEST WITH CONTRAST TECHNIQUE: Multidetector CT imaging of the chest was performed using the standard protocol during bolus administration of intravenous contrast. Multiplanar CT image reconstructions and MIPs were obtained to evaluate the vascular anatomy. CONTRAST:  167mL ISOVUE-370 IOPAMIDOL (ISOVUE-370) INJECTION 76% COMPARISON:  PET/CT performed 04/04/2018 FINDINGS: Cardiovascular:  There is no evidence of pulmonary embolus. The heart is normal in size. Scattered coronary artery calcifications are seen. The patient is status post median sternotomy. Mild calcification  is noted at the aortic arch. The great vessels are grossly unremarkable in  appearance. Mediastinum/Nodes: The mediastinum is unremarkable in appearance. No mediastinal lymphadenopathy is seen. No pericardial effusion is identified. The thyroid gland is diminutive and grossly unremarkable. No axillary lymphadenopathy is seen. Lungs/Pleura: Minimal bibasilar atelectasis is noted. No pleural effusion or pneumothorax is seen. No masses are identified. Upper Abdomen: The visualized portions of the liver and spleen are unremarkable. There is question of slight haziness about the gallbladder. Would correlate clinically for any right upper quadrant symptoms. The visualized portions of the pancreas. Adrenal glands are within normal limits. Nonspecific perinephric stranding is noted bilaterally. Musculoskeletal: No acute osseous abnormalities are identified. The visualized musculature is unremarkable in appearance. Review of the MIP images confirms the above findings. IMPRESSION: 1. No evidence of pulmonary embolus. 2. Minimal bibasilar atelectasis noted. Lungs otherwise clear. 3. Scattered coronary artery calcifications seen. 4. Question of slight haziness about the gallbladder. Would correlate clinically for any right upper quadrant symptoms. Electronically Signed   By: Garald Balding M.D.   On: 06/13/2018 00:32    Procedures Procedures (including critical care time)  Medications Ordered in ED Medications  iopamidol (ISOVUE-370) 76 % injection 100 mL (100 mLs Intravenous Contrast Given 06/13/18 0014)  sodium chloride (PF) 0.9 % injection (  Given by Other 06/13/18 0015)     Initial Impression / Assessment and Plan / ED Course  I have reviewed the triage vital signs and the nursing notes.  Pertinent labs & imaging results that were available during my care of the patient were reviewed by me and considered in my medical decision making (see chart for details).     83 year old with a h/o of Stage T2c adenocarcinoma prostate CA, AAA, CHF, CAD s/p CABG, GERD, HLD, HTN,  hypothyroidism, and DM Type II resenting with swelling, pain, and redness to the left lower leg, worse than baseline dyspnea, and chest pain.  Troponin is negative.  BNP is normal.  EKG with sinus rhythm and no significant change from previous.  Labs are otherwise without significant abnormalities and appear to be at the patient's baseline.  Given redness, pain, swelling over the left ankle, left ankle x-ray has been ordered and is negative.  Low suspicion for gout and the patient has no history of gout.  Given that the patient has active prostate cancer, recent hospitalization during which he did not wear SCDs, and barely getting out of bed for the last 2 weeks, will order CT PE study, which is negative.  The patient was seen and independently evaluated by Dr. Billy Fischer, attending physician.  Since ultrasound is not available at this time will give the patient a one-time dose of Lovenox and schedule them for a.m. venous duplex.  This order was placed after epic downtime had concluded after the patient had Benjamin Harrison been discharged.  Since there is also some overlying erythema and redness, will cover with Keflex for a possible cellulitis, but presumed to be less likely at this time.  This plan was discussed with the patient, his wife, and his daughter who are agreeable at this time.  All questions have been answered.  Low suspicion for ACS, pericarditis, gout, or CHF exacerbation.  Strict return precautions given.  Patient is hemodynamically stable and in no acute distress.  He is safe for discharge home with outpatient venous duplex at this time.  Final Clinical Impressions(s) / ED Diagnoses   Final diagnoses:  Pain and swelling of left lower leg  ED Discharge Orders         Ordered    LE VENOUS     06/13/18 0345           Joline Maxcy A, PA-C 06/13/18 [id number]    Gareth Morgan, MD 06/14/18 1105

## 2018-06-12 NOTE — ED Notes (Signed)
EKG given to EDP,Schlossman,MD. for review.

## 2018-06-13 ENCOUNTER — Encounter (HOSPITAL_COMMUNITY): Payer: Self-pay | Admitting: *Deleted

## 2018-06-13 ENCOUNTER — Ambulatory Visit (HOSPITAL_BASED_OUTPATIENT_CLINIC_OR_DEPARTMENT_OTHER)
Admission: RE | Admit: 2018-06-13 | Discharge: 2018-06-13 | Disposition: A | Discharge status: Home / Self Care | Payer: Medicare Other | Source: Ambulatory Visit | Attending: Emergency Medicine | Admitting: Emergency Medicine

## 2018-06-13 ENCOUNTER — Emergency Department (HOSPITAL_COMMUNITY): Payer: Medicare Other

## 2018-06-13 ENCOUNTER — Encounter: Payer: Self-pay | Admitting: Medical Oncology

## 2018-06-13 DIAGNOSIS — R52 Pain, unspecified: Secondary | ICD-10-CM

## 2018-06-13 DIAGNOSIS — M7989 Other specified soft tissue disorders: Secondary | ICD-10-CM | POA: Diagnosis not present

## 2018-06-13 MED ORDER — CEPHALEXIN 500 MG PO CAPS
ORAL_CAPSULE | ORAL | Status: AC
  Filled 2018-06-13: qty 1

## 2018-06-13 MED ORDER — IOPAMIDOL (ISOVUE-370) INJECTION 76%
100.0000 mL | Freq: Once | INTRAVENOUS | Status: AC | PRN
  Administered 2018-06-13: 100 mL via INTRAVENOUS

## 2018-06-13 MED ORDER — IOPAMIDOL (ISOVUE-370) INJECTION 76%
INTRAVENOUS | Status: AC
  Filled 2018-06-13: qty 100

## 2018-06-13 MED ORDER — SODIUM CHLORIDE (PF) 0.9 % IJ SOLN
INTRAMUSCULAR | Status: AC
  Administered 2018-06-13
  Filled 2018-06-13: qty 50

## 2018-06-13 NOTE — Progress Notes (Signed)
VASCULAR LAB PRELIMINARY  PRELIMINARY  PRELIMINARY  PRELIMINARY  Left lower extremity venous duplex completed.    Preliminary report:  There is no DVT or SVT noted in the left lower extremity.  Mattson Dayal, RVT 06/13/2018, 9:03 AM

## 2018-06-13 NOTE — ED Notes (Signed)
Patient transported to CT 

## 2018-06-16 ENCOUNTER — Other Ambulatory Visit: Payer: Self-pay | Admitting: Physician Assistant

## 2018-06-25 ENCOUNTER — Encounter (INDEPENDENT_AMBULATORY_CARE_PROVIDER_SITE_OTHER): Payer: Medicare Other | Admitting: Ophthalmology

## 2018-07-03 ENCOUNTER — Other Ambulatory Visit: Payer: Self-pay | Admitting: Urology

## 2018-07-03 ENCOUNTER — Telehealth: Payer: Self-pay | Admitting: *Deleted

## 2018-07-03 DIAGNOSIS — C61 Malignant neoplasm of prostate: Secondary | ICD-10-CM

## 2018-07-03 NOTE — Telephone Encounter (Signed)
Called patient to inform of fid. markers and space oar for 07-09-18 @ Dr. Purvis Sheffield Office and his sim on Dec. 12 @  9 am Dr. Johny Shears Office, spoke with patient and he is aware of these appts.

## 2018-07-03 NOTE — Telephone Encounter (Signed)
Patient's sim has been moved to Dec. 19 @ 9 am, patient is aware of this appt.

## 2018-07-15 ENCOUNTER — Other Ambulatory Visit: Payer: Self-pay | Admitting: Cardiovascular Disease

## 2018-07-17 ENCOUNTER — Other Ambulatory Visit: Payer: Self-pay

## 2018-07-17 ENCOUNTER — Ambulatory Visit (HOSPITAL_COMMUNITY)
Admission: RE | Admit: 2018-07-17 | Discharge: 2018-07-17 | Disposition: A | Discharge status: Home / Self Care | Payer: Medicare Other | Source: Ambulatory Visit | Attending: Vascular Surgery | Admitting: Vascular Surgery

## 2018-07-17 ENCOUNTER — Telehealth: Payer: Self-pay | Admitting: *Deleted

## 2018-07-17 ENCOUNTER — Encounter: Payer: Self-pay | Admitting: Vascular Surgery

## 2018-07-17 ENCOUNTER — Ambulatory Visit (INDEPENDENT_AMBULATORY_CARE_PROVIDER_SITE_OTHER): Payer: Medicare Other | Admitting: Vascular Surgery

## 2018-07-17 VITALS — BP 124/75 | HR 88 | Temp 98.1°F | Resp 20 | Ht 66.0 in | Wt 157.0 lb

## 2018-07-17 DIAGNOSIS — C61 Malignant neoplasm of prostate: Secondary | ICD-10-CM | POA: Insufficient documentation

## 2018-07-17 DIAGNOSIS — I714 Abdominal aortic aneurysm, without rupture, unspecified: Secondary | ICD-10-CM

## 2018-07-17 NOTE — Telephone Encounter (Signed)
Called patient to remind of sim appt. for 07-18-18 and his MRI for 07-18-18, spoke with patient and he is aware of these appts.

## 2018-07-17 NOTE — Progress Notes (Signed)
Patient name: Benjamin Harrison MRN: [id number] DOB: [birth year] Sex: male  REASON FOR VISIT:   Follow-up of abdominal aortic aneurysm.  HPI:   Benjamin Harrison is a pleasant 83 y.o. male who I saw in consultation on 01/16/2018 with a 4.3 cm infrarenal abdominal aortic aneurysm.  This was based on a duplex scan that was done elsewhere.  I also reviewed a previous CT scan that was done on 08/08/2016 which showed that the maximum diameter of the aneurysm was 3.5 cm.  Given that the aneurysm appeared to have enlarged significantly between the 2 time intervals I recommended a six-month follow-up duplex scan.  Of note in reviewing his CAT scan I felt that if the aneurysm did enlarge to greater than 5.5 cm he would likely be a candidate for endovascular approach.  The patient denies any abdominal pain or back pain.  Since I saw him last apparently has been diagnosed with prostate cancer with evidence of metastatic disease.  He is receiving medication and radiation therapy.  He denies any claudication or rest pain.  Past Medical History:  Diagnosis Date  . AAA (abdominal aortic aneurysm) ([place])   . CHF (congestive heart failure) ([place])   . Coronary artery disease    a. CABG 2001 with early failure of VG-RCA. b. Inf MI after Lexiscan 01/28/2009 s/p RCA stent. c. Stent to dRCA 02/24/09. d. DES to [place]  2012. e. NSTEMI s/p angioplasty for ISR of RCA 09/2011. f. 05/2012 Cath: LM 20-30p, LAD 100p, LCX min irregs, RCA patent prox stent, 89m ISR, VG->Diag nl w/ dzs in D1 prox to graft, VG->RCA 100, VG->OM nl, LIMA->LAD nl, Nl EF->Med Rx; g. 09/2014 low-risk MV.  Marland Kitchen Dyspnea   . GERD (gastroesophageal reflux disease)   . History of echocardiogram    a. 09/2014 Echo: EF 55-60%, mild basal-midinferior HK, triv AI.  Marland Kitchen Hyperlipidemia   . Hypertension   . Hypertensive heart disease   . Hypothyroidism   . Myocardial infarction ([place])   . Osteoarthritis    a.End-stage osteoarthritis, right knee, medial  compartment  . Personal history of tobacco use   . Prostate cancer ([place])   . Skin cancer    melanoma  . Type II diabetes mellitus ([facility])     Family History  Problem Relation Age of Onset  . Hypertension Mother   . Unexplained death Mother   . Throat cancer Sister   . Lung cancer Sister   . Heart disease Brother   . Pneumonia Brother   . Heart disease Brother   . Prostate cancer Brother   . Unexplained death Brother        at birth  . Unexplained death Brother   . Prostate cancer Son   . Lung cancer Son   . Prostate cancer Other     SOCIAL HISTORY: Social History   Tobacco Use  . Smoking status: Former Smoker    Packs/day: 1.00    Years: 20.00    Pack years: 20.00    Types: Cigarettes    Last attempt to quit: 08/01/1975    Years since quitting: 42.9  . Smokeless tobacco: Never Used  Substance Use Topics  . Alcohol use: No    Allergies  Allergen Reactions  . Codeine Nausea Only    Pt reports this happens off and on    Current Outpatient Medications  Medication Sig Dispense Refill  . acetaminophen (TYLENOL) 500 MG tablet Take 1,000 mg by mouth as needed for mild  pain.    . aspirin 81 MG tablet Take 1 tablet (81 mg total) by mouth daily.    Marland Kitchen atorvastatin (LIPITOR) 40 MG tablet TAKE 1 TABLET BY MOUTH  DAILY (Patient taking differently: Take 40 mg by mouth at bedtime. ) 90 tablet 3  . clopidogrel (PLAVIX) 75 MG tablet TAKE 1 TABLET BY MOUTH  DAILY 90 tablet 3  . fluocinonide (LIDEX) 0.05 % external solution Apply 1 mL topically 2 (two) times daily as needed (dryness and itching).   3  . hydrochlorothiazide (HYDRODIURIL) 25 MG tablet Take 1 tablet (25 mg total) by mouth daily. (Patient taking differently: Take 25 mg by mouth at bedtime. ) 90 tablet 3  . isosorbide mononitrate (IMDUR) 120 MG 24 hr tablet Take 1 tablet (120 mg total) by mouth daily. (Patient taking differently: Take 120 mg by mouth at bedtime. ) 90 tablet 3  . KLOR-CON 10 10 MEQ tablet TAKE 1 TABLET BY  MOUTH EVERY DAY (Patient taking differently: Take 10 mEq by mouth daily. ) 90 tablet 2  . levothyroxine (SYNTHROID, LEVOTHROID) 100 MCG tablet Take 100 mcg by mouth daily before breakfast.     . lisinopril (PRINIVIL,ZESTRIL) 10 MG tablet TAKE 1 TABLET BY MOUTH  DAILY 90 tablet 3  . metoprolol tartrate (LOPRESSOR) 25 MG tablet TAKE 1 TABLET BY MOUTH TWO  TIMES DAILY 180 tablet 3  . ondansetron (ZOFRAN ODT) 4 MG disintegrating tablet Take 1 tablet (4 mg total) by mouth every 8 (eight) hours as needed. (Patient taking differently: Take 4 mg by mouth every 8 (eight) hours as needed for nausea or vomiting. ) 10 tablet 0  . pantoprazole (PROTONIX) 40 MG tablet Take 1 tablet (40 mg total) by mouth 2 (two) times daily. 60 tablet 0  . Polyethyl Glycol-Propyl Glycol (SYSTANE FREE OP) Place 1 drop into both eyes 3 (three) times daily as needed (for dry eyes.).     Marland Kitchen tamsulosin (FLOMAX) 0.4 MG CAPS capsule Take 0.4 mg by mouth daily.  6   No current facility-administered medications for this visit.     REVIEW OF SYSTEMS:  [X]  denotes positive finding, [ ]  denotes negative finding Cardiac  Comments:  Chest pain or chest pressure:    Shortness of breath upon exertion:    Short of breath when lying flat:    Irregular heart rhythm:        Vascular    Pain in calf, thigh, or hip brought on by ambulation:    Pain in feet at night that wakes you up from your sleep:     Blood clot in your veins:    Leg swelling:         Pulmonary    Oxygen at home:    Productive cough:     Wheezing:         Neurologic    Sudden weakness in arms or legs:     Sudden numbness in arms or legs:     Sudden onset of difficulty speaking or slurred speech:    Temporary loss of vision in one eye:     Problems with dizziness:         Gastrointestinal    Blood in stool:     Vomited blood:         Genitourinary    Burning when urinating:     Blood in urine:        Psychiatric    Major depression:         Hematologic  Bleeding problems:    Problems with blood clotting too easily:        Skin    Rashes or ulcers:        Constitutional    Fever or chills:     PHYSICAL EXAM:   Vitals:   07/17/18 0930  BP: 124/75  Pulse: 88  Resp: 20  Temp: 98.1 F (36.7 C)  SpO2: 96%  Weight: 157 lb (71.2 kg)  Height: 5\' 6"  (1.676 m)   GENERAL: The patient is a well-nourished male, in no acute distress. The vital signs are documented above. CARDIAC: There is a regular rate and rhythm.  VASCULAR: I do not detect carotid bruits. He has palpable femoral pulses bilaterally. Both feet are warm and well-perfused. He has no significant lower extremity swelling. PULMONARY: There is good air exchange bilaterally without wheezing or rales. ABDOMEN: Soft and non-tender with normal pitched bowel sounds.  Because of his size I cannot palpate his abdominal aortic aneurysm. MUSCULOSKELETAL: There are no major deformities or cyanosis. NEUROLOGIC: No focal weakness or paresthesias are detected. SKIN: There are no ulcers or rashes noted. PSYCHIATRIC: The patient has a normal affect.  DATA:    DUPLEX ABDOMINAL AORTA: I have individually interpreted his duplex of the abdominal aorta.  The maximum diameter of his infrarenal aorta is 3.6 cm.  The right common iliac artery measures 1.1 cm in maximum diameter.  The left common iliac artery measures 1.2 cm in maximum diameter.  LABS: I reviewed his labs from 06/12/2018.  His creatinine was 1.1 with a GFR of greater than 60.  The patient did have some leg swelling in November and on 06/13/2018 underwent a venous duplex scan.  This showed no evidence of DVT in the left lower extremity.   MEDICAL ISSUES:   3.6 CM INFRARENAL ABDOMINAL AORTIC ANEURYSM: This patient has a small abdominal aortic aneurysm.  I think that the previous ultrasound may have overestimated the size.  His previous CT scan showed the aneurysm to be 3.5 cm in maximum diameter.  Thus there is been no  significant change in the size of his aneurysm.  I think we can stretch his follow-up out to 1 year.  I have ordered a follow-up duplex of his abdominal aorta in 1 year and I will see him back at that time.  He is not a smoker.  His blood pressure has been under good control.  He understands we would not consider elective repair unless the aneurysm reached 5.5 cm in maximum diameter in a normal risk patient.  Based on his previous CAT scan if it did enlarge significantly he may potentially be a candidate for endovascular repair.  However given the small size of the aneurysm I think is unlikely that he will require repair.  However we will continue to follow this closely.  Deitra Mayo [facility] [phone]

## 2018-07-18 ENCOUNTER — Ambulatory Visit (HOSPITAL_COMMUNITY)
Admission: RE | Admit: 2018-07-18 | Discharge: 2018-07-18 | Disposition: A | Discharge status: Home / Self Care | Payer: Medicare Other | Source: Ambulatory Visit | Attending: Urology | Admitting: Urology

## 2018-07-18 ENCOUNTER — Ambulatory Visit
Admission: RE | Admit: 2018-07-18 | Discharge: 2018-07-18 | Disposition: A | Discharge status: Home / Self Care | Payer: Medicare Other | Source: Ambulatory Visit | Attending: Radiation Oncology | Admitting: Radiation Oncology

## 2018-07-18 ENCOUNTER — Encounter: Payer: Self-pay | Admitting: Medical Oncology

## 2018-07-18 DIAGNOSIS — C61 Malignant neoplasm of prostate: Secondary | ICD-10-CM

## 2018-07-18 NOTE — Progress Notes (Signed)
  Radiation Oncology         [phone] ________________________________  Name: Benjamin Harrison MRN: [id number]  Date: 07/18/2018  DOB: [birth year]  SIMULATION AND TREATMENT PLANNING NOTE  No diagnosis found.  DIAGNOSIS:  83 y.o. 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  NARRATIVE:  The patient was brought to the [facility].  Identity was confirmed.  All relevant records and images related to the planned course of therapy were reviewed.  The patient freely provided informed written consent to proceed with treatment after reviewing the details related to the planned course of therapy. The consent form was witnessed and verified by the simulation staff.  Then, the patient was set-up in a stable reproducible supine position for radiation therapy.  A vacuum lock pillow device was custom fabricated to position his legs in a reproducible immobilized position.  Then, I performed a urethrogram under sterile conditions to identify the prostatic apex.  CT images were obtained.  Surface markings were placed.  The CT images were loaded into the planning software.  Then the prostate target and avoidance structures including the rectum, bladder, bowel and hips were contoured.  Treatment planning then occurred.  The radiation prescription was entered and confirmed.  A total of one complex treatment devices were fabricated. I have requested : Intensity Modulated Radiotherapy (IMRT) is medically necessary for this case for the following reason:  Rectal sparing.Marland Kitchen  PLAN:  The patient will receive 45 Gy in 25 fractions of 1.8 Gy, followed by a boost to the prostate to a total dose of 75 Gy with 15 additional fractions of 2.0 Gy.  ________________________________  Sheral Apley Tammi Klippel, M.D.   This document serves as a record of services personally performed by Tyler Pita, MD. It was created on his behalf by Wilburn Mylar, a  trained medical scribe. The creation of this record is based on the scribe's personal observations and the provider's statements to them. This document has been checked and approved by the attending provider.

## 2018-07-30 DIAGNOSIS — C61 Malignant neoplasm of prostate: Secondary | ICD-10-CM | POA: Diagnosis not present

## 2018-08-01 ENCOUNTER — Ambulatory Visit
Admission: RE | Admit: 2018-08-01 | Discharge: 2018-08-01 | Disposition: A | Discharge status: Home / Self Care | Payer: Medicare Other | Source: Ambulatory Visit | Attending: Radiation Oncology | Admitting: Radiation Oncology

## 2018-08-01 DIAGNOSIS — C61 Malignant neoplasm of prostate: Secondary | ICD-10-CM | POA: Insufficient documentation

## 2018-08-02 ENCOUNTER — Ambulatory Visit
Admission: RE | Admit: 2018-08-02 | Discharge: 2018-08-02 | Disposition: A | Discharge status: Home / Self Care | Payer: Medicare Other | Source: Ambulatory Visit | Attending: Radiation Oncology | Admitting: Radiation Oncology

## 2018-08-02 DIAGNOSIS — C61 Malignant neoplasm of prostate: Secondary | ICD-10-CM | POA: Diagnosis not present

## 2018-08-05 ENCOUNTER — Ambulatory Visit
Admission: RE | Admit: 2018-08-05 | Discharge: 2018-08-05 | Disposition: A | Discharge status: Home / Self Care | Payer: Medicare Other | Source: Ambulatory Visit | Attending: Radiation Oncology | Admitting: Radiation Oncology

## 2018-08-05 DIAGNOSIS — C61 Malignant neoplasm of prostate: Secondary | ICD-10-CM | POA: Diagnosis not present

## 2018-08-06 ENCOUNTER — Ambulatory Visit
Admission: RE | Admit: 2018-08-06 | Discharge: 2018-08-06 | Disposition: A | Discharge status: Home / Self Care | Payer: Medicare Other | Source: Ambulatory Visit | Attending: Radiation Oncology | Admitting: Radiation Oncology

## 2018-08-06 DIAGNOSIS — C61 Malignant neoplasm of prostate: Secondary | ICD-10-CM | POA: Diagnosis not present

## 2018-08-07 ENCOUNTER — Ambulatory Visit
Admission: RE | Admit: 2018-08-07 | Discharge: 2018-08-07 | Disposition: A | Discharge status: Home / Self Care | Payer: Medicare Other | Source: Ambulatory Visit | Attending: Radiation Oncology | Admitting: Radiation Oncology

## 2018-08-07 DIAGNOSIS — C61 Malignant neoplasm of prostate: Secondary | ICD-10-CM | POA: Diagnosis not present

## 2018-08-08 ENCOUNTER — Ambulatory Visit
Admission: RE | Admit: 2018-08-08 | Discharge: 2018-08-08 | Disposition: A | Discharge status: Home / Self Care | Payer: Medicare Other | Source: Ambulatory Visit | Attending: Radiation Oncology | Admitting: Radiation Oncology

## 2018-08-08 DIAGNOSIS — C61 Malignant neoplasm of prostate: Secondary | ICD-10-CM | POA: Diagnosis not present

## 2018-08-09 ENCOUNTER — Ambulatory Visit
Admission: RE | Admit: 2018-08-09 | Discharge: 2018-08-09 | Disposition: A | Discharge status: Home / Self Care | Payer: Medicare Other | Source: Ambulatory Visit | Attending: Radiation Oncology | Admitting: Radiation Oncology

## 2018-08-09 DIAGNOSIS — C61 Malignant neoplasm of prostate: Secondary | ICD-10-CM | POA: Diagnosis not present

## 2018-08-12 ENCOUNTER — Ambulatory Visit
Admission: RE | Admit: 2018-08-12 | Discharge: 2018-08-12 | Disposition: A | Discharge status: Home / Self Care | Payer: Medicare Other | Source: Ambulatory Visit | Attending: Radiation Oncology | Admitting: Radiation Oncology

## 2018-08-12 DIAGNOSIS — C61 Malignant neoplasm of prostate: Secondary | ICD-10-CM | POA: Diagnosis not present

## 2018-08-13 ENCOUNTER — Ambulatory Visit
Admission: RE | Admit: 2018-08-13 | Discharge: 2018-08-13 | Disposition: A | Discharge status: Home / Self Care | Payer: Medicare Other | Source: Ambulatory Visit | Attending: Radiation Oncology | Admitting: Radiation Oncology

## 2018-08-13 DIAGNOSIS — C61 Malignant neoplasm of prostate: Secondary | ICD-10-CM | POA: Diagnosis not present

## 2018-08-14 ENCOUNTER — Ambulatory Visit
Admission: RE | Admit: 2018-08-14 | Discharge: 2018-08-14 | Disposition: A | Discharge status: Home / Self Care | Payer: Medicare Other | Source: Ambulatory Visit | Attending: Radiation Oncology | Admitting: Radiation Oncology

## 2018-08-14 DIAGNOSIS — C61 Malignant neoplasm of prostate: Secondary | ICD-10-CM | POA: Diagnosis not present

## 2018-08-15 ENCOUNTER — Ambulatory Visit
Admission: RE | Admit: 2018-08-15 | Discharge: 2018-08-15 | Disposition: A | Discharge status: Home / Self Care | Payer: Medicare Other | Source: Ambulatory Visit | Attending: Radiation Oncology | Admitting: Radiation Oncology

## 2018-08-15 DIAGNOSIS — C61 Malignant neoplasm of prostate: Secondary | ICD-10-CM | POA: Diagnosis not present

## 2018-08-16 ENCOUNTER — Ambulatory Visit
Admission: RE | Admit: 2018-08-16 | Discharge: 2018-08-16 | Disposition: A | Discharge status: Home / Self Care | Payer: Medicare Other | Source: Ambulatory Visit | Attending: Radiation Oncology | Admitting: Radiation Oncology

## 2018-08-16 DIAGNOSIS — C61 Malignant neoplasm of prostate: Secondary | ICD-10-CM | POA: Diagnosis not present

## 2018-08-19 ENCOUNTER — Ambulatory Visit
Admission: RE | Admit: 2018-08-19 | Discharge: 2018-08-19 | Disposition: A | Discharge status: Home / Self Care | Payer: Medicare Other | Source: Ambulatory Visit | Attending: Radiation Oncology | Admitting: Radiation Oncology

## 2018-08-19 DIAGNOSIS — C61 Malignant neoplasm of prostate: Secondary | ICD-10-CM | POA: Diagnosis not present

## 2018-08-20 ENCOUNTER — Ambulatory Visit
Admission: RE | Admit: 2018-08-20 | Discharge: 2018-08-20 | Disposition: A | Discharge status: Home / Self Care | Payer: Medicare Other | Source: Ambulatory Visit | Attending: Radiation Oncology | Admitting: Radiation Oncology

## 2018-08-20 DIAGNOSIS — C61 Malignant neoplasm of prostate: Secondary | ICD-10-CM | POA: Diagnosis not present

## 2018-08-21 ENCOUNTER — Ambulatory Visit
Admission: RE | Admit: 2018-08-21 | Discharge: 2018-08-21 | Disposition: A | Discharge status: Home / Self Care | Payer: Medicare Other | Source: Ambulatory Visit | Attending: Radiation Oncology | Admitting: Radiation Oncology

## 2018-08-21 DIAGNOSIS — C61 Malignant neoplasm of prostate: Secondary | ICD-10-CM | POA: Diagnosis not present

## 2018-08-22 ENCOUNTER — Ambulatory Visit
Admission: RE | Admit: 2018-08-22 | Discharge: 2018-08-22 | Disposition: A | Discharge status: Home / Self Care | Payer: Medicare Other | Source: Ambulatory Visit | Attending: Radiation Oncology | Admitting: Radiation Oncology

## 2018-08-22 DIAGNOSIS — C61 Malignant neoplasm of prostate: Secondary | ICD-10-CM | POA: Diagnosis not present

## 2018-08-23 ENCOUNTER — Ambulatory Visit
Admission: RE | Admit: 2018-08-23 | Discharge: 2018-08-23 | Disposition: A | Discharge status: Home / Self Care | Payer: Medicare Other | Source: Ambulatory Visit | Attending: Radiation Oncology | Admitting: Radiation Oncology

## 2018-08-23 DIAGNOSIS — C61 Malignant neoplasm of prostate: Secondary | ICD-10-CM | POA: Diagnosis not present

## 2018-08-24 ENCOUNTER — Other Ambulatory Visit: Payer: Self-pay | Admitting: Cardiovascular Disease

## 2018-08-26 ENCOUNTER — Ambulatory Visit
Admission: RE | Admit: 2018-08-26 | Discharge: 2018-08-26 | Disposition: A | Discharge status: Home / Self Care | Payer: Medicare Other | Source: Ambulatory Visit | Attending: Radiation Oncology | Admitting: Radiation Oncology

## 2018-08-26 DIAGNOSIS — C61 Malignant neoplasm of prostate: Secondary | ICD-10-CM | POA: Diagnosis not present

## 2018-08-27 ENCOUNTER — Ambulatory Visit
Admission: RE | Admit: 2018-08-27 | Discharge: 2018-08-27 | Disposition: A | Discharge status: Home / Self Care | Payer: Medicare Other | Source: Ambulatory Visit | Attending: Radiation Oncology | Admitting: Radiation Oncology

## 2018-08-27 DIAGNOSIS — C61 Malignant neoplasm of prostate: Secondary | ICD-10-CM | POA: Diagnosis not present

## 2018-08-28 ENCOUNTER — Ambulatory Visit
Admission: RE | Admit: 2018-08-28 | Discharge: 2018-08-28 | Disposition: A | Discharge status: Home / Self Care | Payer: Medicare Other | Source: Ambulatory Visit | Attending: Radiation Oncology | Admitting: Radiation Oncology

## 2018-08-28 DIAGNOSIS — C61 Malignant neoplasm of prostate: Secondary | ICD-10-CM | POA: Diagnosis not present

## 2018-08-29 ENCOUNTER — Ambulatory Visit
Admission: RE | Admit: 2018-08-29 | Discharge: 2018-08-29 | Disposition: A | Discharge status: Home / Self Care | Payer: Medicare Other | Source: Ambulatory Visit | Attending: Radiation Oncology | Admitting: Radiation Oncology

## 2018-08-29 DIAGNOSIS — C61 Malignant neoplasm of prostate: Secondary | ICD-10-CM | POA: Diagnosis not present

## 2018-08-30 ENCOUNTER — Ambulatory Visit
Admission: RE | Admit: 2018-08-30 | Discharge: 2018-08-30 | Disposition: A | Discharge status: Home / Self Care | Payer: Medicare Other | Source: Ambulatory Visit | Attending: Radiation Oncology | Admitting: Radiation Oncology

## 2018-08-30 DIAGNOSIS — C61 Malignant neoplasm of prostate: Secondary | ICD-10-CM | POA: Diagnosis not present

## 2018-09-02 ENCOUNTER — Ambulatory Visit
Admission: RE | Admit: 2018-09-02 | Discharge: 2018-09-02 | Disposition: A | Discharge status: Home / Self Care | Payer: Medicare Other | Source: Ambulatory Visit | Attending: Radiation Oncology | Admitting: Radiation Oncology

## 2018-09-02 DIAGNOSIS — C61 Malignant neoplasm of prostate: Secondary | ICD-10-CM | POA: Diagnosis present

## 2018-09-03 ENCOUNTER — Ambulatory Visit
Admission: RE | Admit: 2018-09-03 | Discharge: 2018-09-03 | Disposition: A | Discharge status: Home / Self Care | Payer: Medicare Other | Source: Ambulatory Visit | Attending: Radiation Oncology | Admitting: Radiation Oncology

## 2018-09-03 DIAGNOSIS — C61 Malignant neoplasm of prostate: Secondary | ICD-10-CM | POA: Diagnosis not present

## 2018-09-04 ENCOUNTER — Ambulatory Visit
Admission: RE | Admit: 2018-09-04 | Discharge: 2018-09-04 | Disposition: A | Discharge status: Home / Self Care | Payer: Medicare Other | Source: Ambulatory Visit | Attending: Radiation Oncology | Admitting: Radiation Oncology

## 2018-09-04 DIAGNOSIS — C61 Malignant neoplasm of prostate: Secondary | ICD-10-CM | POA: Diagnosis not present

## 2018-09-05 ENCOUNTER — Ambulatory Visit
Admission: RE | Admit: 2018-09-05 | Discharge: 2018-09-05 | Disposition: A | Discharge status: Home / Self Care | Payer: Medicare Other | Source: Ambulatory Visit | Attending: Radiation Oncology | Admitting: Radiation Oncology

## 2018-09-05 DIAGNOSIS — C61 Malignant neoplasm of prostate: Secondary | ICD-10-CM | POA: Diagnosis not present

## 2018-09-06 ENCOUNTER — Ambulatory Visit
Admission: RE | Admit: 2018-09-06 | Discharge: 2018-09-06 | Disposition: A | Discharge status: Home / Self Care | Payer: Medicare Other | Source: Ambulatory Visit | Attending: Radiation Oncology | Admitting: Radiation Oncology

## 2018-09-06 DIAGNOSIS — C61 Malignant neoplasm of prostate: Secondary | ICD-10-CM | POA: Diagnosis not present

## 2018-09-09 ENCOUNTER — Ambulatory Visit
Admission: RE | Admit: 2018-09-09 | Discharge: 2018-09-09 | Disposition: A | Discharge status: Home / Self Care | Payer: Medicare Other | Source: Ambulatory Visit | Attending: Radiation Oncology | Admitting: Radiation Oncology

## 2018-09-09 DIAGNOSIS — C61 Malignant neoplasm of prostate: Secondary | ICD-10-CM | POA: Diagnosis not present

## 2018-09-10 ENCOUNTER — Ambulatory Visit
Admission: RE | Admit: 2018-09-10 | Discharge: 2018-09-10 | Disposition: A | Discharge status: Home / Self Care | Payer: Medicare Other | Source: Ambulatory Visit | Attending: Radiation Oncology | Admitting: Radiation Oncology

## 2018-09-10 DIAGNOSIS — C61 Malignant neoplasm of prostate: Secondary | ICD-10-CM | POA: Diagnosis not present

## 2018-09-11 ENCOUNTER — Ambulatory Visit
Admission: RE | Admit: 2018-09-11 | Discharge: 2018-09-11 | Disposition: A | Discharge status: Home / Self Care | Payer: Medicare Other | Source: Ambulatory Visit | Attending: Radiation Oncology | Admitting: Radiation Oncology

## 2018-09-11 DIAGNOSIS — C61 Malignant neoplasm of prostate: Secondary | ICD-10-CM | POA: Diagnosis not present

## 2018-09-12 ENCOUNTER — Ambulatory Visit
Admission: RE | Admit: 2018-09-12 | Discharge: 2018-09-12 | Disposition: A | Discharge status: Home / Self Care | Payer: Medicare Other | Source: Ambulatory Visit | Attending: Radiation Oncology | Admitting: Radiation Oncology

## 2018-09-12 DIAGNOSIS — C61 Malignant neoplasm of prostate: Secondary | ICD-10-CM | POA: Diagnosis not present

## 2018-09-13 ENCOUNTER — Ambulatory Visit
Admission: RE | Admit: 2018-09-13 | Discharge: 2018-09-13 | Disposition: A | Discharge status: Home / Self Care | Payer: Medicare Other | Source: Ambulatory Visit | Attending: Radiation Oncology | Admitting: Radiation Oncology

## 2018-09-13 DIAGNOSIS — C61 Malignant neoplasm of prostate: Secondary | ICD-10-CM | POA: Diagnosis not present

## 2018-09-16 ENCOUNTER — Ambulatory Visit
Admission: RE | Admit: 2018-09-16 | Discharge: 2018-09-16 | Disposition: A | Discharge status: Home / Self Care | Payer: Medicare Other | Source: Ambulatory Visit | Attending: Radiation Oncology | Admitting: Radiation Oncology

## 2018-09-16 DIAGNOSIS — C61 Malignant neoplasm of prostate: Secondary | ICD-10-CM | POA: Diagnosis not present

## 2018-09-17 ENCOUNTER — Ambulatory Visit
Admission: RE | Admit: 2018-09-17 | Discharge: 2018-09-17 | Disposition: A | Discharge status: Home / Self Care | Payer: Medicare Other | Source: Ambulatory Visit | Attending: Radiation Oncology | Admitting: Radiation Oncology

## 2018-09-17 DIAGNOSIS — C61 Malignant neoplasm of prostate: Secondary | ICD-10-CM | POA: Diagnosis not present

## 2018-09-18 ENCOUNTER — Ambulatory Visit
Admission: RE | Admit: 2018-09-18 | Discharge: 2018-09-18 | Disposition: A | Discharge status: Home / Self Care | Payer: Medicare Other | Source: Ambulatory Visit | Attending: Radiation Oncology | Admitting: Radiation Oncology

## 2018-09-18 DIAGNOSIS — C61 Malignant neoplasm of prostate: Secondary | ICD-10-CM | POA: Diagnosis not present

## 2018-09-19 ENCOUNTER — Ambulatory Visit
Admission: RE | Admit: 2018-09-19 | Discharge: 2018-09-19 | Disposition: A | Discharge status: Home / Self Care | Payer: Medicare Other | Source: Ambulatory Visit | Attending: Radiation Oncology | Admitting: Radiation Oncology

## 2018-09-19 DIAGNOSIS — C61 Malignant neoplasm of prostate: Secondary | ICD-10-CM | POA: Diagnosis not present

## 2018-09-20 ENCOUNTER — Ambulatory Visit
Admission: RE | Admit: 2018-09-20 | Discharge: 2018-09-20 | Disposition: A | Discharge status: Home / Self Care | Payer: Medicare Other | Source: Ambulatory Visit | Attending: Radiation Oncology | Admitting: Radiation Oncology

## 2018-09-20 DIAGNOSIS — C61 Malignant neoplasm of prostate: Secondary | ICD-10-CM | POA: Diagnosis not present

## 2018-09-23 ENCOUNTER — Ambulatory Visit
Admission: RE | Admit: 2018-09-23 | Discharge: 2018-09-23 | Disposition: A | Discharge status: Home / Self Care | Payer: Medicare Other | Source: Ambulatory Visit | Attending: Radiation Oncology | Admitting: Radiation Oncology

## 2018-09-23 DIAGNOSIS — C61 Malignant neoplasm of prostate: Secondary | ICD-10-CM | POA: Diagnosis not present

## 2018-09-24 ENCOUNTER — Ambulatory Visit
Admission: RE | Admit: 2018-09-24 | Discharge: 2018-09-24 | Disposition: A | Discharge status: Home / Self Care | Payer: Medicare Other | Source: Ambulatory Visit | Attending: Radiation Oncology | Admitting: Radiation Oncology

## 2018-09-24 DIAGNOSIS — C61 Malignant neoplasm of prostate: Secondary | ICD-10-CM | POA: Diagnosis not present

## 2018-09-25 ENCOUNTER — Ambulatory Visit
Admission: RE | Admit: 2018-09-25 | Discharge: 2018-09-25 | Disposition: A | Discharge status: Home / Self Care | Payer: Medicare Other | Source: Ambulatory Visit | Attending: Radiation Oncology | Admitting: Radiation Oncology

## 2018-09-25 DIAGNOSIS — C61 Malignant neoplasm of prostate: Secondary | ICD-10-CM | POA: Diagnosis not present

## 2018-09-27 ENCOUNTER — Encounter: Payer: Self-pay | Admitting: Radiation Oncology

## 2018-09-27 NOTE — Progress Notes (Signed)
  Radiation Oncology         [phone] ________________________________  Name: Benjamin Harrison MRN: [id number]  Date: 09/27/2018  DOB: June 21, 1935  End of Treatment Note  Diagnosis:   84 y.o. 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     Indication for treatment:  Curative, Definitive Radiotherapy       Radiation treatment dates:   08/01/2018 - 09/25/2018  Site/dose:  1. The prostate, seminal vesicles, and pelvic lymph nodes were initially treated to 45 Gy in 25 fractions of 1.8 Gy  2. The prostate only was boosted to 75 Gy with 15 additional fractions of 2.0 Gy   Beams/energy:  1. The prostate, seminal vesicles, and pelvic lymph nodes were initially treated using helical intensity modulated radiotherapy delivering 6 megavolt photons. Image guidance was performed with megavoltage CT studies prior to each fraction. He was immobilized with a body fix lower extremity mold.  2. the prostate only was boosted using helical intensity modulated radiotherapy delivering 6 megavolt photons. Image guidance was performed with megavoltage CT studies prior to each fraction. He was immobilized with a body fix lower extremity mold.  Narrative: The patient tolerated radiation treatment relatively well. He was given ADT concurrent with radiation. He reported taking imodium, Flomax, and AZO for management of symptoms during treatment. He experienced dysuria, incomplete bladder emptying, urgency with leakage, moderate fatigue, hesitancy and straining, and weak to intermittent stream throughout treatment. He denied hematuria. He reported increased nocturia, from x2 to x4 with reports of up to x10. He reported that the diarrhea resolved towards the end of treatments.  Plan: The patient has completed radiation treatment. He will return to radiation oncology clinic for routine followup in one month. I advised him to call or return sooner if he has any  questions or concerns related to his recovery or treatment. ________________________________  Sheral Apley. Tammi Klippel, M.D.  This document serves as a record of services personally performed by Tyler Pita, MD. It was created on his behalf by Wilburn Mylar, a trained medical scribe. The creation of this record is based on the scribe's personal observations and the provider's statements to them. This document has been checked and approved by the attending provider.

## 2018-10-25 ENCOUNTER — Other Ambulatory Visit: Payer: Self-pay | Admitting: Medical

## 2018-10-25 ENCOUNTER — Other Ambulatory Visit: Payer: Self-pay | Admitting: Cardiovascular Disease

## 2018-10-30 ENCOUNTER — Telehealth: Payer: Self-pay | Admitting: Urology

## 2018-10-30 ENCOUNTER — Ambulatory Visit: Payer: Medicare Other | Admitting: Urology

## 2018-10-30 NOTE — Telephone Encounter (Signed)
Radiation Oncology         [phone] ________________________________  Name: Benjamin Harrison MRN: [id number]  Date: 10/30/2018  DOB: [birth year]  Post Treatment Note  CC: Leonard Downing, MD  No ref. provider found  Diagnosis:   84 y.o.gentleman with Stage T2cadenocarcinoma of the prostate with Gleason score of 4+3, and PSA of37.3.    Interval Since Last Radiation:  5 weeks  08/01/2018 - 09/25/2018: 1. The prostate, seminal vesicles, and pelvic lymph nodes were initially treated to 45 Gy in 25 fractions of 1.8 Gy  2. The prostate only was boosted to 75 Gy with 15 additional fractions of 2.0 Gy   Narrative:  I spoke with the patient to conduct his routine scheduled 1 month follow up visit via telephone to spare the patient unnecessary potential exposure in the healthcare setting during the current COVID-19 pandemic.  The patient was notified in advance and gave permission to proceed with this visit format.         He tolerated radiation treatment relatively well. He was maintained on ADT concurrent with radiation. He reported taking imodium, Flomax, and AZO for management of symptoms during treatment. He experienced dysuria, hesitancy, intermittency, incomplete bladder emptying, urgency with leakage, hesitancy and straining to void throughout treatment. He reported increased nocturia up to x10/night but denied gross hematuria, fever or chills. He experienced diarrhea and modest fatigue.                     On review of systems, the patient states that he is doing fairly well overall.  He continues to take Flomax and AZO daily for management of his LUTS and does feel like his symptoms are gradually improving.  He continues with persistent weak stream, hesitancy, intermittency, straining to initiate his stream and feelings of incomplete bladder emptying.  He denies gross hematuria or dysuria but feels that the AZO pill improves his flow of stream and ability to start his stream.  He  continues with occasional, mild diarrhea which is managed with Imodium as needed.  He reports a healthy appetite and is maintaining his weight.  He denies abdominal pain, nausea, vomiting, fever, chills or night sweats.  He has continued to tolerate his ADT well despite modest fatigue and has been able to remain active.  ALLERGIES:  is allergic to codeine.  Meds: Current Outpatient Medications  Medication Sig Dispense Refill   acetaminophen (TYLENOL) 500 MG tablet Take 1,000 mg by mouth as needed for mild pain.     aspirin 81 MG tablet Take 1 tablet (81 mg total) by mouth daily.     atorvastatin (LIPITOR) 40 MG tablet TAKE 1 TABLET BY MOUTH  DAILY 90 tablet 2   clopidogrel (PLAVIX) 75 MG tablet TAKE 1 TABLET BY MOUTH  DAILY 90 tablet 3   fluocinonide (LIDEX) 0.05 % external solution Apply 1 mL topically 2 (two) times daily as needed (dryness and itching).   3   hydrochlorothiazide (HYDRODIURIL) 25 MG tablet Take 1 tablet (25 mg total) by mouth daily. (Patient taking differently: Take 25 mg by mouth at bedtime. ) 90 tablet 3   isosorbide mononitrate (IMDUR) 120 MG 24 hr tablet TAKE 1 TABLET BY MOUTH  DAILY 90 tablet 3   levothyroxine (SYNTHROID, LEVOTHROID) 100 MCG tablet Take 100 mcg by mouth daily before breakfast.      lisinopril (PRINIVIL,ZESTRIL) 10 MG tablet TAKE 1 TABLET BY MOUTH  DAILY 90 tablet 3   metoprolol tartrate (LOPRESSOR) 25  MG tablet TAKE 1 TABLET BY MOUTH TWO  TIMES DAILY 180 tablet 3   ondansetron (ZOFRAN ODT) 4 MG disintegrating tablet Take 1 tablet (4 mg total) by mouth every 8 (eight) hours as needed. (Patient taking differently: Take 4 mg by mouth every 8 (eight) hours as needed for nausea or vomiting. ) 10 tablet 0   pantoprazole (PROTONIX) 40 MG tablet Take 1 tablet (40 mg total) by mouth 2 (two) times daily. 180 tablet 2   Polyethyl Glycol-Propyl Glycol (SYSTANE FREE OP) Place 1 drop into both eyes 3 (three) times daily as needed (for dry eyes.).       potassium chloride (K-DUR) 10 MEQ tablet TAKE 1 TABLET BY MOUTH AT  NIGHT 90 tablet 2   tamsulosin (FLOMAX) 0.4 MG CAPS capsule Take 0.4 mg by mouth daily.  6   No current facility-administered medications for this visit.     Physical Findings: Unable to complete due to telephone format for this visit.  Lab Findings: Lab Results  Component Value Date   WBC 7.9 06/12/2018   HGB 10.8 (L) 06/12/2018   HCT 33.5 (L) 06/12/2018   MCV 80.0 06/12/2018   PLT 214 06/12/2018     Radiographic Findings: No results found.  Impression/Plan: 1. 84 y.o.gentleman with Stage T2cadenocarcinoma of the prostate with Gleason score of 4+3, and PSA of37.3.   He will continue to follow up with urology for ongoing PSA determinations and has an appointment scheduled with Dr. Gloriann Loan on January 03, 2019.  He continues with moderate LUTS but reports that these are gradually improving.  He will continue using Flomax daily and AZO as needed.  I did advise him to call to be seen sooner with urology if he should develop gross hematuria, fever, chills or experience any increase in his LUTS as opposed to the expected continued gradual improvement.  He understands what to expect with regards to PSA monitoring going forward. I will look forward to following his response to treatment via correspondence with urology, and would be happy to continue to participate in his care if clinically indicated. I talked to the patient about what to expect in the future, including his risk for erectile dysfunction and rectal bleeding. I encouraged him to call or return to the office if he has any questions regarding his previous radiation or possible radiation side effects. He was comfortable with this plan and will follow up as needed.    Nicholos Johns, PA-C

## 2018-11-18 ENCOUNTER — Other Ambulatory Visit: Payer: Medicare Other

## 2018-11-28 ENCOUNTER — Ambulatory Visit: Payer: Medicare Other | Admitting: Cardiovascular Disease

## 2019-01-28 ENCOUNTER — Other Ambulatory Visit: Payer: Medicare Other

## 2019-03-21 IMAGING — CT CT RENAL STONE PROTOCOL
2 of 4 series · 16 of 46 positions shown, 18 images · non-contrast
Comparison: 08/08/2016

CLINICAL DATA: Flank pain with stone disease suspected. Concern for
UTI and kidney failure.

EXAM:
CT ABDOMEN AND PELVIS WITHOUT CONTRAST
TECHNIQUE: Multidetector CT imaging of the abdomen and pelvis was performed
following the standard protocol without IV contrast.

[Series 3: ap without · axial · non-contrast · 0.77mm/px · z∈[-895,-465]mm · 13 of 100 slices shown, 15 images]
[im 7/100  soft-tissue]
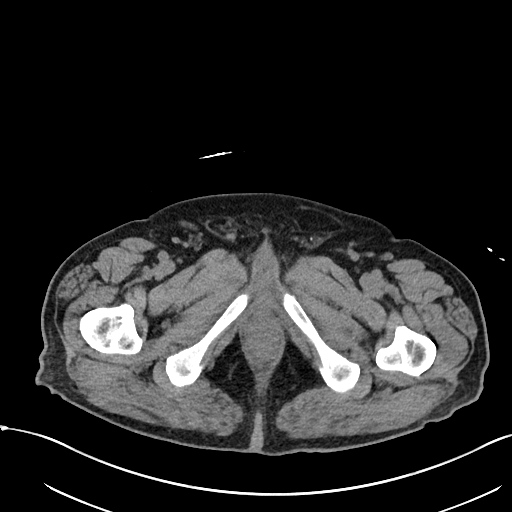
[im 7/100  bone]
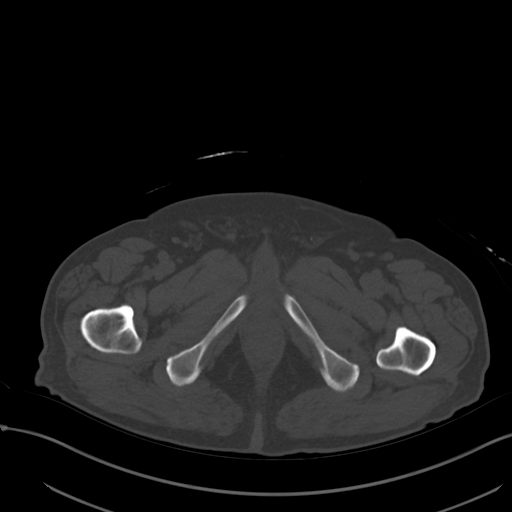
[im 13/100  soft-tissue]
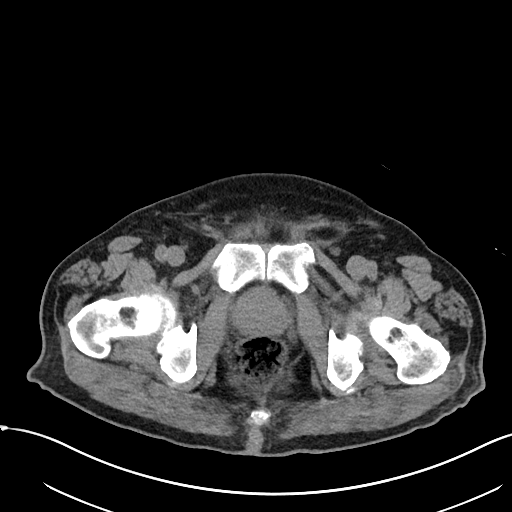
[im 19/100  soft-tissue]
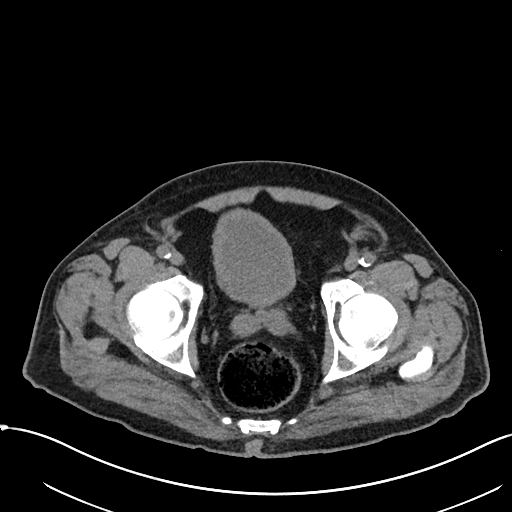
[im 31/100  soft-tissue]
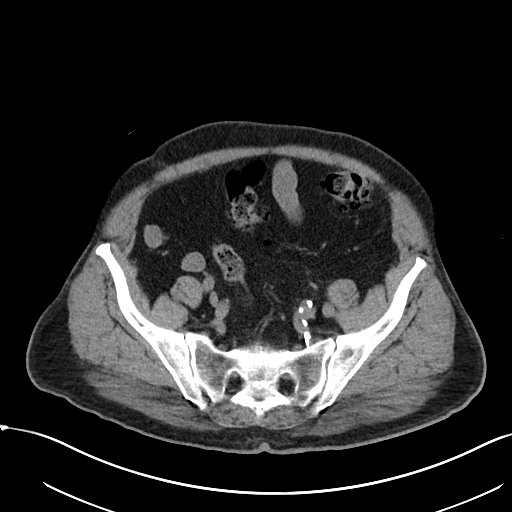
[im 38/100  soft-tissue]
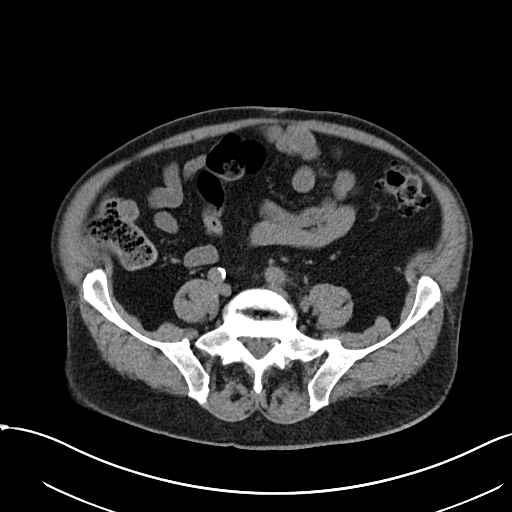
[im 44/100  soft-tissue]
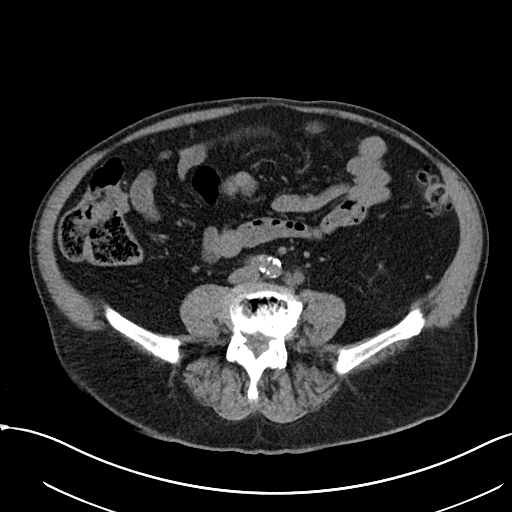
[im 50/100  soft-tissue]
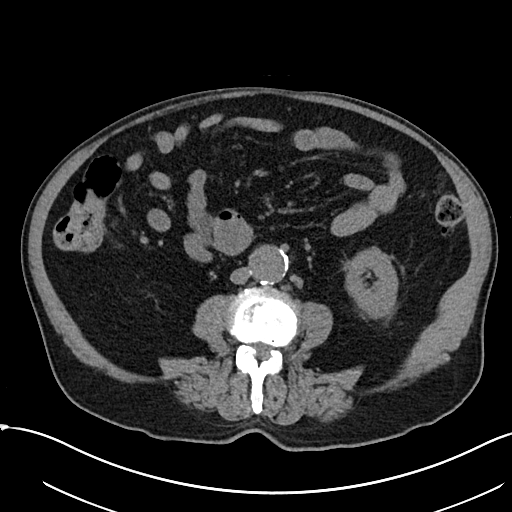
[im 56/100  soft-tissue]
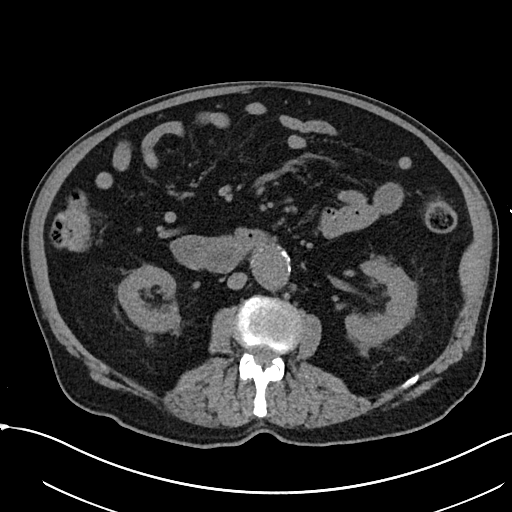
[im 62/100  soft-tissue]
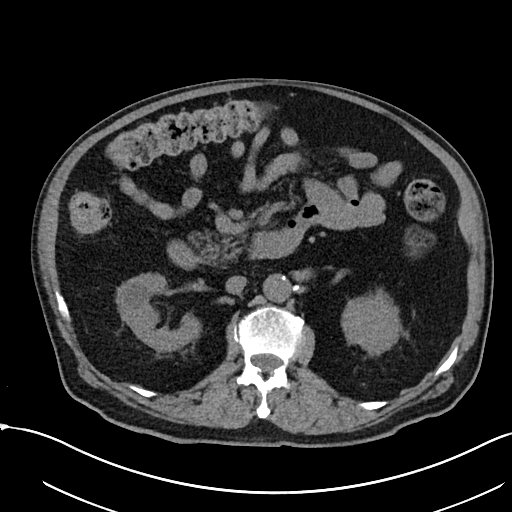
[im 62/100  bone]
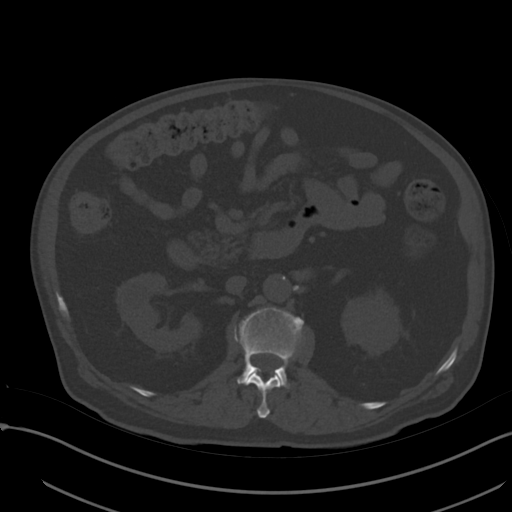
[im 69/100  soft-tissue]
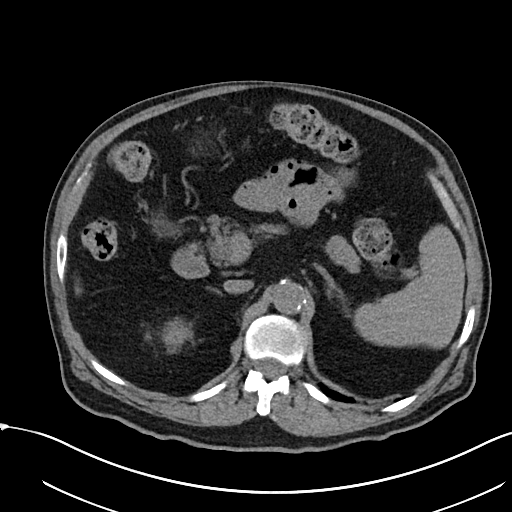
[im 81/100  soft-tissue]
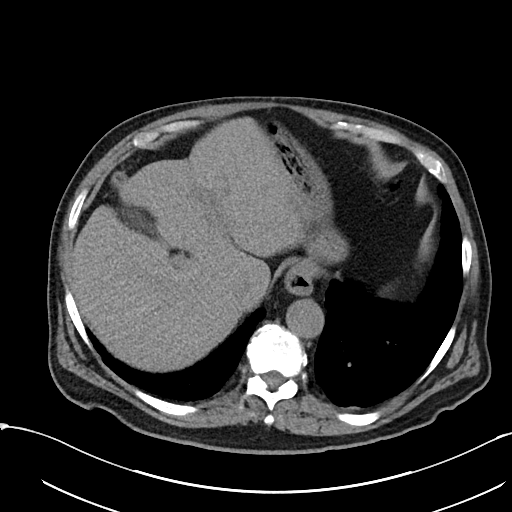
[im 87/100  soft-tissue]
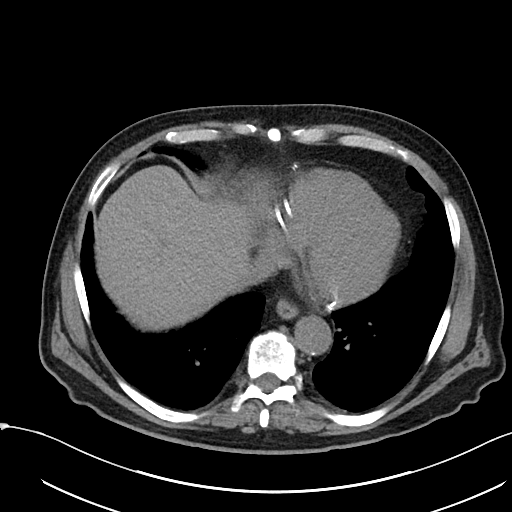
[im 93/100  soft-tissue]
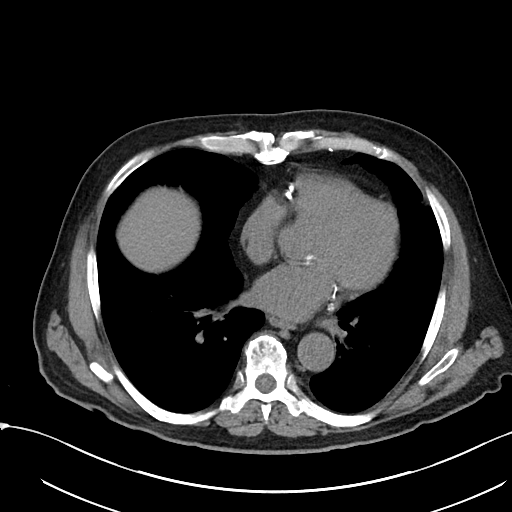

[Series 6: cor · coronal · 0.84mm/px · 3 of 107 slices shown]
[im 36/107  soft-tissue]
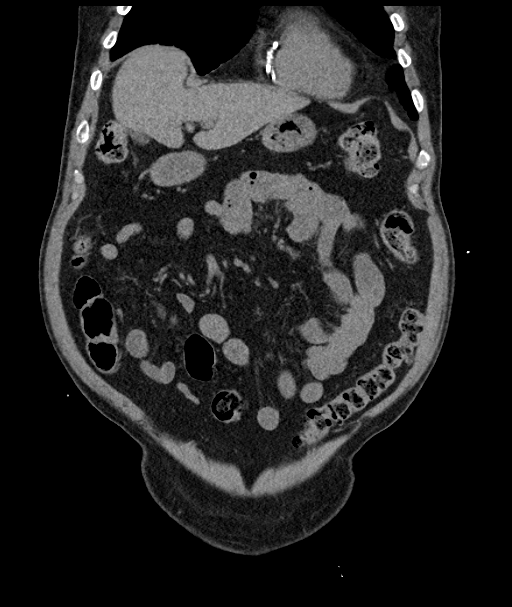
[im 48/107  soft-tissue]
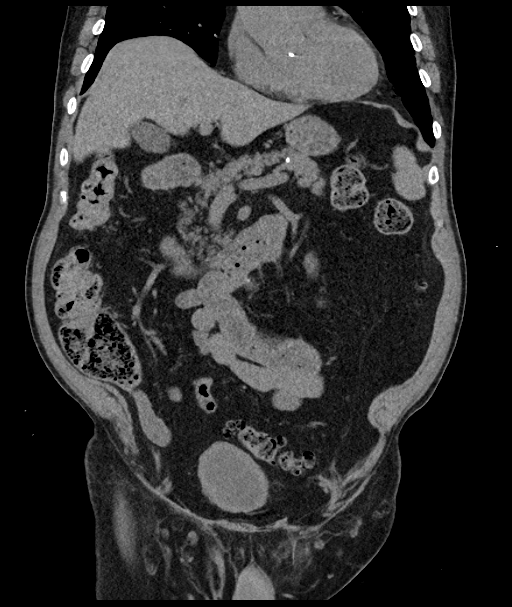
[im 59/107  soft-tissue]
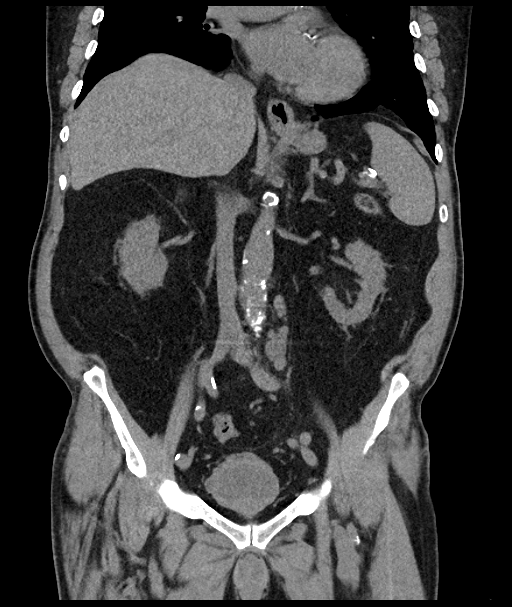

[16 of 46 positions shown; findings below may reference images not displayed]

FINDINGS: Lower chest:  Status post CABG.

Hepatobiliary: No focal liver abnormality.No evidence of biliary
obstruction or stone.

Pancreas: Unremarkable.

Spleen: Unremarkable.

Adrenals/Urinary Tract: Negative adrenals. No hydronephrosis or
stone. Right renal cystic density. Circumferential bladder wall
thickening, chronic when compared to prior, possible sequela of
chronic outlet obstruction given small diverticulum/cellule
superiorly, also seen previously. No over distention currently.

Stomach/Bowel: Extensive distal colonic diverticulosis. Stool
throughout much of the colon without over distension. Probable pill
in the enteric stream. Nonvisualized appendix. No pericecal
inflammation.

Vascular/Lymphatic: Atherosclerosis with infrarenal fusiform
aneurysmal enlargement of the aorta to 3.7 cm. Newly enlarged lymph
node along the left common iliac artery measuring 15 mm short axis.

Reproductive:Negative.

Other: No ascites or pneumoperitoneum.

Musculoskeletal: Degenerative changes without acute finding.
IMPRESSION: 1. No acute finding.  No hydronephrosis or ureteral stone.
2. Chronic bladder wall thickening, possible chronic outlet
obstruction given diverticula/cellule appearance.
3. Single newly enlarged lymph node along the left common iliac
artery, nonspecific in isolation. Suggest noncontrast abdominal CT
in 3 months.
4.  Aortic Atherosclerosis (029IC-KJI.I).
5. Abdominal aortic aneurysm measuring up to 3.7 cm. Recommend
followup by ultrasound in 2 years. This recommendation follows ACR
consensus guidelines: White Paper of the ACR Incidental Findings
Committee II on Vascular Findings. [HOSPITAL] 7457;

## 2019-04-03 ENCOUNTER — Ambulatory Visit (INDEPENDENT_AMBULATORY_CARE_PROVIDER_SITE_OTHER): Payer: Medicare Other | Admitting: Cardiovascular Disease

## 2019-04-03 ENCOUNTER — Encounter: Payer: Self-pay | Admitting: Cardiovascular Disease

## 2019-04-03 ENCOUNTER — Other Ambulatory Visit: Payer: Self-pay

## 2019-04-03 VITALS — BP 116/62 | HR 69 | Ht 66.0 in | Wt 169.0 lb

## 2019-04-03 DIAGNOSIS — I714 Abdominal aortic aneurysm, without rupture, unspecified: Secondary | ICD-10-CM

## 2019-04-03 DIAGNOSIS — E785 Hyperlipidemia, unspecified: Secondary | ICD-10-CM

## 2019-04-03 DIAGNOSIS — I2510 Atherosclerotic heart disease of native coronary artery without angina pectoris: Secondary | ICD-10-CM | POA: Diagnosis not present

## 2019-04-03 NOTE — Progress Notes (Signed)
Cardiology Office Note   Date:  04/03/2019   ID:  [name], DOB May 22, 1935, MRN [id number]  PCP:  Leonard Downing, MD  Cardiologist:   Mertie Moores, MD   No chief complaint on file.  1. Coronary artery disease: Status post CABG in2001. He status post stenting of his mid right coronary artery in 10/17/2010 2. Hypertension 3. Hypothyroidism 4. Hyperlipidemia 5. Diabetes mellitus    Benjamin Harrison is a 84 year old gentleman with a history of coronary artery disease. He status post PTCA and stenting of his right coronary. He had repeat stenting of his right coronary artery as recently as March, 2012. He had another heart attack and had severe stenosis of his distal right coronary artery stent. Dr. Martinique opened up the stent in March, 2013. Saphenous vein graft to right coronary artery is occluded.  Since that time he's had persistent headaches and generalized weakness. He's continued to have some episodes of chest pain. He's also had some episodes of lightheadedness.  He's feeling a bit better since I saw him several months ago. He still is not able to exercise with about 15 or 20 minutes. Is clear that he still eats salty foods. He enjoys peanut butter on Ritz crackers almost every night. He also eats occasional country ham and gravy biscuits.  October 07, 2012  He is doing well. He had a cath in November, 2013. He has been on Imdur and is feeling better. He occasionally has a headache.   Nov. 24, 2014:  Benjamin Harrison is having more dyspnea with exertion recently - walking outside, taking a shower.  He avoids salt.  He has not had much if any chest pain.   Feb. 24, 2015: Benjamin Harrison is doing ok. He was having some dyspnea but this has resolved.   March 23, 2014: He is doing ok. He stopped taking his atorvastatin due to leg aches. His aches and pains are better since in the atorvastatin. He has lots of problems with arthritis and has taken steroids. Staying  active except as limited by his joints    Feb. 22, 2016: Benjamin Harrison is a 84 y.o. male who presents for follow up of his CAD. He has been having some chest tightness and pain.  Occurred at night.  Was very weak for 3 days. Was just getting ready for bed.   Radiated out to his shoulder.  Occasionally has exertional CP / last for several minutes.  No palpitations,  No dizziness  ,  Does have some orthostatic hypotension.   Dec 04, 2014: Benjamin Harrison was seen recently for some chest pain   Myoview shows: Overall Impression: There is a small area of moderate scar affecting the apical cap and the apical lateral segment. There is no ischemia. This is a low risk scan. The description of the study from December, 2014 is slightly different from this study. However, after careful review, I am not convinced that there has been any change. There is question of an area of insignificant scar near the apex. There is no ischemia.  LV Ejection Fraction: 58%. LV Wall Motion: Normal Wall Motion   Echo shows: Left ventricle: The cavity size was normal. There was mild focal basal hypertrophy of the septum. Systolic function was normal. The estimated ejection fraction was in the range of 55% to 60%. Probable mild hypokinesis of the basal-midinferior myocardium. Left ventricular diastolic function parameters were normal. - Aortic valve: There was trivial regurgitation.  Aug. 3, 2016:  Doing well. No CP.  We added Imdur at his last visit and is feeling much better.   Has DOE still His CP and dyspnea are better on the Imdur   Jan. 30, 2017:  Doing well.  Occasional CP .  Takes NTG on occasion .  Perhaps once a month   Aug. 29, 2017:  Benjamin Harrison had PCI ( angiosculp cutting balloon ) of his mid and distal right coronary artery on 12/06/2015. Breathing better.   Has taken 1-2 NTG .   Staying active Works out at [employer] 3 days a week.    Gets tired quicker than he wants to . Also wants to  decrease his Nexium Advised Pepcid complete  Feb. 13, 2018:  Benjamin Harrison was hospitalized with gastroenteritis with SIRS in January. He has recovered nicely. He was found to have a small 3.5 cm abdominal aortic aneurysm incidentally.  Doing OK from a cardiac standpoint. Has some occasional chest discomfort .   Does not worsen with exertion .   He thinks it may be gas.    04/27/2017: Hurts is seen today for follow-up of his coronary artery disease, hypertension, hyperlipidemia.  C/o right leg pain and burning. Feels cold .  Worse with exertion, better with rest. Has been there for 3 months  Hs some chest pain .   Rest and exertion . Takes NTG Has some DOE  Nov. 1, 2019: Has been diagnosed with prostate cancer  PSA is 37.  Has metastised to some lymph nodes and ribs.  Will be getting Lupron injections and XRT  No CP or dyspnea  Has mild CP if he takes it easy .   April 03, 2019: Benjamin Harrison is doing fairly well.  He has a history of coronary artery disease, hypertension, hyperlipidemia. Has gotten weaker recently .  Has had some episodes of angina that runs under his left arm Occurs spontaneously .  Not necessarily with exercise.  Advised him to try NTG SL and also to try mylanta  Has not been able to exercise much at all .  Various aches and pain .   Very weak from the Lupron injections   Had lots of joint pains.   He held his atorvastatin starting last week     Past Medical History:  Diagnosis Date  . AAA (abdominal aortic aneurysm) ([place])   . CHF (congestive heart failure) ([place])   . Coronary artery disease    a. CABG 2001 with early failure of VG-RCA. b. Inf MI after Lexiscan 01/28/2009 s/p RCA stent. c. Stent to dRCA 02/24/09. d. DES to [place]  2012. e. NSTEMI s/p angioplasty for ISR of RCA 09/2011. f. 05/2012 Cath: LM 20-30p, LAD 100p, LCX min irregs, RCA patent prox stent, 33m ISR, VG->Diag nl w/ dzs in D1 prox to graft, VG->RCA 100, VG->OM nl, LIMA->LAD nl, Nl EF->Med Rx;  g. 09/2014 low-risk MV.  Marland Kitchen Dyspnea   . GERD (gastroesophageal reflux disease)   . History of echocardiogram    a. 09/2014 Echo: EF 55-60%, mild basal-midinferior HK, triv AI.  Marland Kitchen Hyperlipidemia   . Hypertension   . Hypertensive heart disease   . Hypothyroidism   . Myocardial infarction ([place])   . Osteoarthritis    a.End-stage osteoarthritis, right knee, medial compartment  . Personal history of tobacco use   . Prostate cancer ([place])   . Skin cancer    melanoma  . Type II diabetes mellitus ([place])     Past Surgical History:  Procedure Laterality Date  . CARDIAC CATHETERIZATION    .  CARDIAC CATHETERIZATION N/A 12/06/2015   Procedure: Left Heart Cath and Cors/Grafts Angiography;  Surgeon: Burnell Blanks, MD;  Location: [facility];  Service: Cardiovascular;  Laterality: N/A;  . CARDIAC CATHETERIZATION N/A 12/06/2015   Procedure: Coronary Balloon Angioplasty;  Surgeon: Burnell Blanks, MD;  Location: [facility];  Service: Cardiovascular;  Laterality: N/A;  . CATARACT EXTRACTION    . CORONARY ARTERY BYPASS GRAFT    . EYE SURGERY    . HERNIA REPAIR  1985  . INTRAVASCULAR ULTRASOUND/IVUS N/A 10/15/2017   Procedure: Intravascular Ultrasound/IVUS;  Surgeon: Belva Crome, MD;  Location: [facility];  Service: Cardiovascular;  Laterality: N/A;  . KNEE ARTHROSCOPY  1999 and 2004  . LEFT HEART CATH AND CORS/GRAFTS ANGIOGRAPHY N/A 10/15/2017   Procedure: LEFT HEART CATH AND CORS/GRAFTS ANGIOGRAPHY;  Surgeon: Belva Crome, MD;  Location: [facility];  Service: Cardiovascular;  Laterality: N/A;  . LEFT HEART CATHETERIZATION WITH CORONARY ANGIOGRAM N/A 10/09/2011   Procedure: LEFT HEART CATHETERIZATION WITH CORONARY ANGIOGRAM;  Surgeon: Peter M Martinique, MD;  [facility];  Service: Cardiovascular;  Laterality: N/A;  . LEFT HEART CATHETERIZATION WITH CORONARY/GRAFT ANGIOGRAM N/A 06/06/2012   Procedure: LEFT HEART CATHETERIZATION WITH Beatrix Fetters;  Surgeon: Peter M Martinique, MD;  Location: [facility] CATH LAB;  Service: Cardiovascular;  Laterality: N/A;     Current Outpatient Medications  Medication Sig Dispense Refill  . acetaminophen (TYLENOL) 500 MG tablet Take 1,000 mg by mouth as needed for mild pain.    Marland Kitchen aspirin 81 MG tablet Take 1 tablet (81 mg total) by mouth daily.    Marland Kitchen atorvastatin (LIPITOR) 40 MG tablet TAKE 1 TABLET BY MOUTH  DAILY 90 tablet 2  . clopidogrel (PLAVIX) 75 MG tablet TAKE 1 TABLET BY MOUTH  DAILY 90 tablet 3  . Ferrous Sulfate (IRON PO) Take 1 tablet by mouth daily.    . fluocinonide (LIDEX) 0.05 % external solution Apply 1 mL topically 2 (two) times daily as needed (dryness and itching).   3  . hydrochlorothiazide (HYDRODIURIL) 25 MG tablet Take 1 tablet (25 mg total) by mouth daily. 90 tablet 3  . isosorbide mononitrate (IMDUR) 120 MG 24 hr tablet TAKE 1 TABLET BY MOUTH  DAILY 90 tablet 3  . levothyroxine (SYNTHROID, LEVOTHROID) 100 MCG tablet Take 100 mcg by mouth daily before breakfast.     . lisinopril (PRINIVIL,ZESTRIL) 10 MG tablet TAKE 1 TABLET BY MOUTH  DAILY 90 tablet 3  . metoprolol tartrate (LOPRESSOR) 25 MG tablet TAKE 1 TABLET BY MOUTH TWO  TIMES DAILY 180 tablet 3  . ondansetron (ZOFRAN ODT) 4 MG disintegrating tablet Take 1 tablet (4 mg total) by mouth every 8 (eight) hours as needed. 10 tablet 0  . pantoprazole (PROTONIX) 40 MG tablet Take 1 tablet (40 mg total) by mouth 2 (two) times daily. 180 tablet 2  . Polyethyl Glycol-Propyl Glycol (SYSTANE FREE OP) Place 1 drop into both eyes 3 (three) times daily as needed (for dry eyes.).     Marland Kitchen potassium chloride (K-DUR) 10 MEQ tablet TAKE 1 TABLET BY MOUTH AT  NIGHT 90 tablet 2  . tamsulosin (FLOMAX) 0.4 MG CAPS capsule Take 0.4 mg by mouth daily.  6   No current facility-administered medications for this visit.     Allergies:   Codeine    Social History:  The patient  reports that he quit smoking about 43 years ago. His smoking use included  cigarettes. He  has a 20.00 pack-year smoking history. He has never used smokeless tobacco. He reports that he does not drink alcohol or use drugs.   Family History:  The patient's family history includes Heart disease in his brother and brother; Hypertension in his mother; Lung cancer in his sister and son; Pneumonia in his brother; Prostate cancer in his brother, son, and another family member; Throat cancer in his sister; Unexplained death in his brother, brother, and mother.    ROS:  Please see the history of present illness.    Physical Exam: Height 5\' 6"  (1.676 m), weight 169 lb (76.7 kg).  GEN:  Well nourished, well developed in no acute distress HEENT: Normal NECK: No JVD; No carotid bruits LYMPHATICS: No lymphadenopathy CARDIAC: RRR , no murmurs, rubs, gallops RESPIRATORY:  Clear to auscultation without rales, wheezing or rhonchi  ABDOMEN: Soft, non-tender, non-distended MUSCULOSKELETAL:  No edema; No deformity  SKIN: Warm and dry NEUROLOGIC:  Alert and oriented x 3    EKG:  Sept. 3, 2020:   NSR at 75.  PACs  .  Old Inf. MI   Recent Labs: 06/08/2018: Magnesium 1.9 06/12/2018: ALT 32; B Natriuretic Peptide 38.0; BUN 19; Creatinine, Ser 1.10; Hemoglobin 10.8; Platelets 214; Potassium 3.4; Sodium 141    Lipid Panel    Component Value Date/Time   CHOL 116 05/31/2018 1100   TRIG 111 05/31/2018 1100   HDL 34 (L) 05/31/2018 1100   CHOLHDL 3.4 05/31/2018 1100   CHOLHDL 4.1 03/22/2016 0959   VLDL 23 03/22/2016 0959   LDLCALC 60 05/31/2018 1100      Wt Readings from Last 3 Encounters:  04/03/19 169 lb (76.7 kg)  07/17/18 157 lb (71.2 kg)  06/12/18 160 lb (72.6 kg)      Other studies Reviewed: Additional studies/ records that were reviewed today include: . Review of the above records demonstrates:    ASSESSMENT AND PLAN:  1. Coronary artery disease:  He has an occluded graft to the right coronary artery and the native right coronary artery.  He has occasional  episodes of angina and takes nitroglycerin.  nothing serious   2. Hypertension -BP is well controlled.    3. Hypothyroidism - Plan per primary   4. Hyperlipidemia - check labs today.  5. Diabetes mellitus   Current medicines are reviewed at length with the patient today.  The patient does not have concerns regarding medicines.  The following changes have been made:  See above.    Disposition:   FU with me in 6 months    Signed, Mertie Moores, MD  04/03/2019 11:08 AM    [facility] [address], [place], [place]  [address] Phone: [phone]; Fax: [phone]

## 2019-04-03 NOTE — Patient Instructions (Signed)
Medication Instructions:  Your physician recommends that you continue on your current medications as directed. Please refer to the Current Medication list given to you today.  If you need a refill on your cardiac medications before your next appointment, please call your pharmacy.   Lab work: TODAY - cholesterol, liver panel, basic metabolic panel (kidney function and electrolytes)  If you have labs (blood work) drawn today and your tests are completely normal, you will receive your results only by: Marland Kitchen MyChart Message (if you have MyChart) OR . A paper copy in the mail If you have any lab test that is abnormal or we need to change your treatment, we will call you to review the results.   Testing/Procedures: None Ordered   Follow-Up: At [facility], you and your health needs are our priority.  As part of our continuing mission to provide you with exceptional heart care, we have created designated Provider Care Teams.  These Care Teams include your primary Cardiologist (physician) and Advanced Practice Providers (APPs -  Physician Assistants and Nurse Practitioners) who all work together to provide you with the care you need, when you need it. You will need a follow up appointment in:  6 months.  Please call our office 2 months in advance to schedule this appointment.  You may see Mertie Moores, MD or one of the following Advanced Practice Providers on your designated Care Team: Richardson Dopp, PA-C [name], [place] . Daune Perch, NP

## 2019-04-04 ENCOUNTER — Telehealth: Payer: Self-pay | Admitting: Nurse Practitioner

## 2019-04-04 DIAGNOSIS — E785 Hyperlipidemia, unspecified: Secondary | ICD-10-CM

## 2019-04-04 DIAGNOSIS — I2510 Atherosclerotic heart disease of native coronary artery without angina pectoris: Secondary | ICD-10-CM

## 2019-04-04 LAB — BASIC METABOLIC PANEL
BUN/Creatinine Ratio: 16 (ref 10–24)
BUN: 17 mg/dL (ref 8–27)
CO2: 25 mmol/L (ref 20–29)
Calcium: 9.2 mg/dL (ref 8.6–10.2)
Chloride: 103 mmol/L (ref 96–106)
Creatinine, Ser: 1.07 mg/dL (ref 0.76–1.27)
GFR calc Af Amer: 74 mL/min/{1.73_m2} (ref >59)
GFR calc non Af Amer: 64 mL/min/{1.73_m2} (ref >59)
Glucose: 148 mg/dL — ABNORMAL HIGH (ref 65–99)
Potassium: 4.5 mmol/L (ref 3.5–5.2)
Sodium: 141 mmol/L (ref 134–144)

## 2019-04-04 LAB — LIPID PANEL
Chol/HDL Ratio: 5.1 ratio — ABNORMAL HIGH (ref 0.0–5.0)
Cholesterol, Total: 177 mg/dL (ref 100–199)
HDL: 35 mg/dL — ABNORMAL LOW (ref >39)
LDL Chol Calc (NIH): 106 mg/dL — ABNORMAL HIGH (ref 0–99)
Triglycerides: 209 mg/dL — ABNORMAL HIGH (ref 0–149)
VLDL Cholesterol Cal: 36 mg/dL (ref 5–40)

## 2019-04-04 LAB — HEPATIC FUNCTION PANEL
ALT: 17 IU/L (ref 0–44)
AST: 15 IU/L (ref 0–40)
Albumin: 3.9 g/dL (ref 3.6–4.6)
Alkaline Phosphatase: 84 IU/L (ref 39–117)
Bilirubin Total: 0.7 mg/dL (ref 0.0–1.2)
Bilirubin, Direct: 0.16 mg/dL (ref 0.00–0.40)
Total Protein: 6.6 g/dL (ref 6.0–8.5)

## 2019-04-04 MED ORDER — ROSUVASTATIN CALCIUM 20 MG PO TABS: 20.0000 mg | ORAL_TABLET | Freq: Every day | ORAL | 3 refills | Status: DC

## 2019-04-04 NOTE — Telephone Encounter (Signed)
Reviewed results with patient. He states he stopped the atorvastatin a few weeks ago due to joint pain and that he shared this information with Dr. Acie Fredrickson yesterday. He agrees to stop atorvastatin indefinitely and start rosuvastatin 20 mg. I advised him to wait another week before starting rosuvastatin and to call back if he does not tolerate it. He has a lab appointment on 12/3. He thanked me for the call.

## 2019-04-04 NOTE — Telephone Encounter (Signed)
-----   Message from Thayer Headings, MD sent at 04/04/2019 10:56 AM EDT ----- LDL is still elevated.    DC atorvastatin Start Rosuvastatin 20 mg a day  Check lipids, liver, bmp in 3 months

## 2019-06-24 ENCOUNTER — Other Ambulatory Visit: Payer: Self-pay | Admitting: Cardiovascular Disease

## 2019-07-03 ENCOUNTER — Other Ambulatory Visit: Payer: Medicare Other | Admitting: *Deleted

## 2019-07-03 ENCOUNTER — Other Ambulatory Visit: Payer: Self-pay

## 2019-07-03 DIAGNOSIS — E785 Hyperlipidemia, unspecified: Secondary | ICD-10-CM

## 2019-07-03 DIAGNOSIS — I2510 Atherosclerotic heart disease of native coronary artery without angina pectoris: Secondary | ICD-10-CM

## 2019-07-03 LAB — BASIC METABOLIC PANEL
BUN/Creatinine Ratio: 15 (ref 10–24)
BUN: 15 mg/dL (ref 8–27)
CO2: 23 mmol/L (ref 20–29)
Calcium: 9.2 mg/dL (ref 8.6–10.2)
Chloride: 106 mmol/L (ref 96–106)
Creatinine, Ser: 1.01 mg/dL (ref 0.76–1.27)
GFR calc Af Amer: 80 mL/min/{1.73_m2} (ref >59)
GFR calc non Af Amer: 69 mL/min/{1.73_m2} (ref >59)
Glucose: 163 mg/dL — ABNORMAL HIGH (ref 65–99)
Potassium: 4.1 mmol/L (ref 3.5–5.2)
Sodium: 142 mmol/L (ref 134–144)

## 2019-07-03 LAB — HEPATIC FUNCTION PANEL
ALT: 6 IU/L (ref 0–44)
AST: 13 IU/L (ref 0–40)
Albumin: 3.4 g/dL — ABNORMAL LOW (ref 3.6–4.6)
Alkaline Phosphatase: 100 IU/L (ref 39–117)
Bilirubin Total: 0.5 mg/dL (ref 0.0–1.2)
Bilirubin, Direct: 0.14 mg/dL (ref 0.00–0.40)
Total Protein: 6 g/dL (ref 6.0–8.5)

## 2019-07-03 LAB — LIPID PANEL
Chol/HDL Ratio: 3 ratio (ref 0.0–5.0)
Cholesterol, Total: 81 mg/dL — ABNORMAL LOW (ref 100–199)
HDL: 27 mg/dL — ABNORMAL LOW (ref >39)
LDL Chol Calc (NIH): 33 mg/dL (ref 0–99)
Triglycerides: 110 mg/dL (ref 0–149)
VLDL Cholesterol Cal: 21 mg/dL (ref 5–40)

## 2019-07-04 ENCOUNTER — Telehealth: Payer: Self-pay | Admitting: Hematology and Oncology

## 2019-07-04 NOTE — Telephone Encounter (Signed)
A new hem appt has been scheduled for Benjamin Harrison to see Dr. Lorenso Courier on 12/11 at 2pm. He's been made aware to arrive 15 minutes early.

## 2019-07-10 NOTE — Progress Notes (Signed)
[place] [phone]   Fax:([phone] [place] NOTE  Patient Care Team: Leonard Downing, MD as PCP - General (Family Medicine) [name], [name], MD as PCP - Cardiology (Cardiology)  Hematological/Oncological History #Microcytic Anemia 1) 08/08/2016: WBC 11.0, Hgb 14.9, Plt 248. MCV 75.3 2) 10/11/2017: WBC 6.3, Hgb 13.5, Plt 191. MCV 73 3) 06/12/2018: WBC 7.9, Hgb 10.8, Plt 214. MCV 80 4) 07/11/2019: WBC 4.4, Hgb 10.3, Plt 176. MCV 77.5.  5)  07/11/2019: establish care with Dr. 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   2) Prostate, seminal vesicles, and pelvic lymph nodes were treated to 45 Gy in 25 fractions of 08/01/2018 - 09/25/2018: 1.8 Gy. Prostate was boosted to 75 Gy with 15 additional fractions of 2.0 Gy.  3) Currently being monitored by urology.   CHIEF COMPLAINTS/PURPOSE OF CONSULTATION:  Anemia  HISTORY OF PRESENTING ILLNESS:  Benjamin Harrison 84 y.o. male with medical history significant for DM Type II, HLD, CAD, and CHF who presents for evaluation of anemia.   On review of previous records has a low-grade anemia dating back to at least July 2010.  Also of note he appears to have a microcytosis which has been consistently present even when his hemoglobin has been normal.  His other blood counts have been within normal limits with no clear abnormalities noted in the white blood cell line or the platelets.  A note in 2012 mentions that he had a colonoscopy 10 years prior, meaning his last colonoscopy was likely performed in 2002.  He currently has no chronic inflammatory conditions and no reports of any clear source of bleeding.  Benjamin Harrison also has a concurrent history of prostate cancer which was found to be a Gleason 4+3 and was treated with radiation therapy to the prostate, seminal vesicles, and pelvic lymph nodes.  This was performed by  Dr. Tammi Klippel here at the [facility].  His PSAs are subsequently followed by urology.  On exam today Benjamin Harrison notes that he has been having trouble with nausea weakness and dizziness.  He notes that this has been going on for months, however the last month has been particularly bad.  He does report easy bruising, noting his most recent bruises right antecubital bruise from blood stick.  He is also currently taking p.o. iron therapy and is unsure exactly when he began taking this.  He reports no other clear overt signs of bleeding, no nosebleeds, dark stools, or large bruises.  He notes he eats an unrestricted diet with red meat, other meats, and fruits and vegetables.  Benjamin Harrison notes that he has no strong family history of any bleeding disorders.  He also notes that he is a never smoker and does not drink alcohol.  A full 10 point ROS is listed below.  MEDICAL HISTORY:  Past Medical History:  Diagnosis Date  . AAA (abdominal aortic aneurysm) ([place])   . CHF (congestive heart failure) ([place])   . Coronary artery disease    a. CABG 2001 with early failure of VG-RCA. b. Inf MI after Lexiscan 01/28/2009 s/p RCA stent. c. Stent to dRCA 02/24/09. d. DES to [place]  2012. e. NSTEMI s/p angioplasty for ISR of RCA 09/2011. f. 05/2012 Cath: LM 20-30p, LAD 100p, LCX min irregs, RCA patent prox stent, 35mISR, VG->Diag nl w/ dzs in D1 prox to graft, VG->RCA 100, VG->OM  nl, LIMA->LAD nl, Nl EF->Med Rx; g. 09/2014 low-risk MV.  Marland Kitchen Dyspnea   . GERD (gastroesophageal reflux disease)   . History of echocardiogram    a. 09/2014 Echo: EF 55-60%, mild basal-midinferior HK, triv AI.  Marland Kitchen Hyperlipidemia   . Hypertension   . Hypertensive heart disease   . Hypothyroidism   . Myocardial infarction ([place])   . Osteoarthritis    a.End-stage osteoarthritis, right knee, medial compartment  . Personal history of tobacco use   . Prostate cancer ([place])   . Skin cancer    melanoma  . Type II diabetes mellitus ([place])      SURGICAL HISTORY: Past Surgical History:  Procedure Laterality Date  . CARDIAC CATHETERIZATION    . CARDIAC CATHETERIZATION N/A 12/06/2015   Procedure: Left Heart Cath and Cors/Grafts Angiography;  Surgeon: Burnell Blanks, MD;  Location: [place] [facility];  Service: Cardiovascular;  Laterality: N/A;  . CARDIAC CATHETERIZATION N/A 12/06/2015   Procedure: Coronary Balloon Angioplasty;  Surgeon: Burnell Blanks, MD;  Location: [place] [facility];  Service: Cardiovascular;  Laterality: N/A;  . CATARACT EXTRACTION    . CORONARY ARTERY BYPASS GRAFT    . EYE SURGERY    . HERNIA REPAIR  1985  . INTRAVASCULAR ULTRASOUND/IVUS N/A 10/15/2017   Procedure: Intravascular Ultrasound/IVUS;  Surgeon: Belva Crome, MD;  Location: [place] [facility];  Service: Cardiovascular;  Laterality: N/A;  . KNEE ARTHROSCOPY  1999 and 2004  . LEFT HEART CATH AND CORS/GRAFTS ANGIOGRAPHY N/A 10/15/2017   Procedure: LEFT HEART CATH AND CORS/GRAFTS ANGIOGRAPHY;  Surgeon: Belva Crome, MD;  Location: [place] [facility];  Service: Cardiovascular;  Laterality: N/A;  . LEFT HEART CATHETERIZATION WITH CORONARY ANGIOGRAM N/A 10/09/2011   Procedure: LEFT HEART CATHETERIZATION WITH CORONARY ANGIOGRAM;  Surgeon: Peter M Martinique, MD;  Location: [facility] CATH LAB;  Service: Cardiovascular;  Laterality: N/A;  . LEFT HEART CATHETERIZATION WITH CORONARY/GRAFT ANGIOGRAM N/A 06/06/2012   Procedure: LEFT HEART CATHETERIZATION WITH Beatrix Fetters;  Surgeon: Peter M Martinique, MD;  Location: [facility] C[facility];  Service: Cardiovascular;  Laterality: N/A;    SOCIAL HISTORY: Social History   Socioeconomic History  . Marital status: Married    Spouse name: Not on file  . Number of children: 2  . Years of education: Not on file  . Highest education level: Not on file  Occupational History  . Not on file  Tobacco Use  . Smoking status: Former Smoker    Packs/day: 1.00    Years: 20.00    Pack years: 20.00    Types: Cigarettes     Quit date: 08/01/1975    Years since quitting: 43.9  . Smokeless tobacco: Never Used  Substance and Sexual Activity  . Alcohol use: No  . Drug use: No  . Sexual activity: Not Currently  Other Topics Concern  . Not on file  Social History Narrative   Lives locally with wife.   Social Determinants of Health   Financial Resource Strain:   . Difficulty of Paying Living Expenses: Not on file  Food Insecurity:   . Worried About Charity fundraiser in the Last Year: Not on file  . Ran Out of Food in the Last Year: Not on file  Transportation Needs:   . Lack of Transportation (Medical): Not on file  . Lack of Transportation (Non-Medical): Not on file  Physical Activity:   . Days of Exercise per Week: Not on file  . Minutes of Exercise per Session:  Not on file  Stress:   . Feeling of Stress : Not on file  Social Connections:   . Frequency of Communication with Friends and Family: Not on file  . Frequency of Social Gatherings with Friends and Family: Not on file  . Attends Religious Services: Not on file  . Active Member of Clubs or Organizations: Not on file  . Attends Club or Organization Meetings: Not on file  . Marital Status: Not on file  Intimate Partner Violence:   . Fear of Current or Ex-Partner: Not on file  . Emotionally Abused: Not on file  . Physically Abused: Not on file  . Sexually Abused: Not on file    FAMILY HISTORY: Family History  Problem Relation Age of Onset  . Hypertension Mother   . Unexplained death Mother   . Throat cancer Sister   . Lung cancer Sister   . Heart disease Brother   . Pneumonia Brother   . Heart disease Brother   . Prostate cancer Brother   . Unexplained death Brother        at birth  . Unexplained death Brother   . Prostate cancer Son   . Lung cancer Son   . Prostate cancer Other     ALLERGIES:  is allergic to codeine.  MEDICATIONS:  Current Outpatient Medications  Medication Sig Dispense Refill  . acetaminophen (TYLENOL)  500 MG tablet Take 1,000 mg by mouth as needed for mild pain.    . aspirin 81 MG tablet Take 1 tablet (81 mg total) by mouth daily.    . clopidogrel (PLAVIX) 75 MG tablet TAKE 1 TABLET BY MOUTH  DAILY 90 tablet 3  . Ferrous Sulfate (IRON PO) Take 1 tablet by mouth daily.    . fluocinonide (LIDEX) 0.05 % external solution Apply 1 mL topically 2 (two) times daily as needed (dryness and itching).   3  . hydrochlorothiazide (HYDRODIURIL) 25 MG tablet Take 1 tablet (25 mg total) by mouth daily. 90 tablet 3  . isosorbide mononitrate (IMDUR) 120 MG 24 hr tablet TAKE 1 TABLET BY MOUTH  DAILY 90 tablet 3  . levothyroxine (SYNTHROID, LEVOTHROID) 100 MCG tablet Take 100 mcg by mouth daily before breakfast.     . lisinopril (ZESTRIL) 10 MG tablet TAKE 1 TABLET BY MOUTH  DAILY 90 tablet 2  . metoprolol tartrate (LOPRESSOR) 25 MG tablet TAKE 1 TABLET BY MOUTH TWO  TIMES DAILY 180 tablet 3  . ondansetron (ZOFRAN ODT) 4 MG disintegrating tablet Take 1 tablet (4 mg total) by mouth every 8 (eight) hours as needed. 10 tablet 0  . pantoprazole (PROTONIX) 40 MG tablet Take 1 tablet (40 mg total) by mouth 2 (two) times daily. 180 tablet 2  . Polyethyl Glycol-Propyl Glycol (SYSTANE FREE OP) Place 1 drop into both eyes 3 (three) times daily as needed (for dry eyes.).     . potassium chloride (K-DUR) 10 MEQ tablet TAKE 1 TABLET BY MOUTH AT  NIGHT 90 tablet 2  . rosuvastatin (CRESTOR) 20 MG tablet Take 1 tablet (20 mg total) by mouth daily. 90 tablet 3  . tamsulosin (FLOMAX) 0.4 MG CAPS capsule Take 0.4 mg by mouth daily.  6   No current facility-administered medications for this visit.    REVIEW OF SYSTEMS:   Constitutional: ( - ) fevers, ( - )  chills , ( - ) night sweats Eyes: ( - ) blurriness of vision, ( - ) double vision, ( - ) watery eyes Ears, nose,   mouth, throat, and face: ( - ) mucositis, ( - ) sore throat Respiratory: ( - ) cough, ( - ) dyspnea, ( - ) wheezes Cardiovascular: ( - ) palpitation, ( - ) chest  discomfort, ( - ) lower extremity swelling Gastrointestinal:  ( + ) nausea, ( - ) heartburn, ( - ) change in bowel habits Skin: ( - ) abnormal skin rashes Lymphatics: ( - ) new lymphadenopathy, ( - ) easy bruising Neurological: ( - ) numbness, ( - ) tingling, ( - ) new weaknesses (+) dizziness Behavioral/Psych: ( - ) mood change, ( - ) new changes  All other systems were reviewed with the patient and are negative.  PHYSICAL EXAMINATION: ECOG PERFORMANCE STATUS: 1 - Symptomatic but completely ambulatory  Vitals:   07/11/19 1355  BP: (!) 160/80  Pulse: 60  Resp: 17  Temp: 98.2 F (36.8 C)  SpO2: 100%   Filed Weights   07/11/19 1355  Weight: 168 lb (76.2 kg)    GENERAL: well appearing elderly Caucasian male in NAD  SKIN: skin color, texture, turgor are normal, no rashes or significant lesions EYES: conjunctiva are pink and non-injected, sclera clear LUNGS: clear to auscultation and percussion with normal breathing effort HEART: regular rate & rhythm and no murmurs and no lower extremity edema ABDOMEN: soft, non-tender, non-distended, normal bowel sounds Musculoskeletal: no cyanosis of digits and no clubbing  PSYCH: alert & oriented x 3, fluent speech NEURO: no focal motor/sensory deficits  LABORATORY DATA:  I have reviewed the data as listed Lab Results  Component Value Date   WBC 4.4 07/11/2019   HGB 10.3 (L) 07/11/2019   HCT 31.7 (L) 07/11/2019   MCV 77.5 (L) 07/11/2019   PLT 176 07/11/2019   NEUTROABS 2.7 07/11/2019   Lab Results  Component Value Date   HGB 10.3 (L) 07/11/2019   HGB 10.8 (L) 06/12/2018   HGB 12.7 (L) 06/06/2018   HGB 12.6 (L) 02/07/2018   HGB 11.5 (L) 01/14/2018   HGB 15.4 01/13/2018   HGB 12.3 (L) 10/16/2017   HGB 13.5 10/11/2017   HGB 11.6 (L) 08/10/2016   HGB 12.3 (L) 08/09/2016     PATHOLOGY: Prostate pathology not on file.   BLOOD FILM: Review of the peripheral blood smear showed normal appearing white cells with neutrophils that  were appropriately lobated and granulated. There was no predominance of bi-lobed or hyper-segmented neutrophils appreciated. No Dohle bodies were noted. There was no left shifting, immature forms or blasts noted. Lymphocytes remain normal in size without any predominance of large granular lymphocytes. Red cells show no anisopoikilocytosis, macrocytes , microcytes or polychromasia. There were no schistocytes, target cells, echinocytes, acanthocytes, dacrocytes, or stomatocytes.There was no rouleaux formation, nucleated red cells, or intra-cellular inclusions noted. The platelets are normal in size, shape, and color without any clumping evident.  RADIOGRAPHIC STUDIES: None relevant to review.  ASSESSMENT & PLAN Benjamin Harrison 84 y.o. male with medical history significant for DM Type II, HLD, CAD, and CHF who presents for evaluation of anemia.  On review of the records it appears that the patient has an acute on chronic microcytic anemia.  The MCV has varied throughout the course of the 10 years of records we have from low normal to low.  His hemoglobin is rarely in the normal range and has been hovering in the 12 for several years.  It appears that in the last year the hemoglobin has trended downward slightly to an average now of approximately hemoglobin of 10.    The possible etiologies for this include iron deficiency anemia from blood loss, likely GI versus primary bone marrow disorder in the form of malignancy or radiation side effect, or other possible nutritional deficiency.  I would recommend at this time that he continue the p.o. iron supplementation that he was started on and that if her labs are consistent with iron deficiency anemia we consider GI evaluation.  Additionally if it is apparent that he has a primary bone marrow issue we can discuss consideration of a bone marrow biopsy.  Other possible etiology would be anemia from radiation of the prostate.  It is not uncommon for patients to have  extensive radiation of the pelvis to develop cytopenias as result of damage to the bone marrow.  If this were the case recovery would be slow and diagnosis would be difficult, not necessarily confirmed with bone marrow biopsy.  Fortunately at this time the patient's hemoglobin is above the level required for transfusion and therefore we can continue our evaluation for the possible etiology at this time.  Of note his peripheral blood film did show increased elliptocytes and anisopoikilocytosis which are nonspecific findings.  We will plan to have the patient return in approximately 3 months time.  #Microcytic Anemia, acute on chronic -- today will evaluate nutritional studies with iron panel, ferritin, Vitamin B12, folate, MMA, homocysteine and copper --will assess for monoclonal gammopathy with SPEP and SFLC --review of peripheral blood film shows increased elliptocytes and anisopoikilocytosis, but no diagnostic findings --possible component of hemoglobinopathy as the patient has a longstanding microcytosis with or without anemia. Can consider Hgb electrophoresis at a subsequent visit if microcytosis does not improve. --continue iron sulfate 325mg PO daily with a source of vitamin C as prescribed by PCP --additionally will collect EPO level, TSH, and retic panel/LDH --if patient is shown to be iron deficient will need to consider GI evaluation for the source of his blood loss --RTC in 3 months to reassess or sooner if required.   #Prostate Cancer s/p Radiation Therapy --s/p radiation therapy at [facility].  --continue to follow with urology for serial PSAs and ADT therapy  --can monitor in our clinic if the patient desires   Orders Placed This Encounter  Procedures  . CBC with Differential (Cancer Center Only)    Standing Status:   Future    Number of Occurrences:   1    Standing Expiration Date:   07/10/2020  . Retic Panel    Standing Status:   Future    Number of Occurrences:   1    Standing  Expiration Date:   07/10/2020  . Save Smear (SSMR)    Standing Status:   Future    Number of Occurrences:   1    Standing Expiration Date:   07/10/2020  . CMP (Cancer Center only)    Standing Status:   Future    Number of Occurrences:   1    Standing Expiration Date:   07/10/2020  . Sedimentation rate    Standing Status:   Future    Number of Occurrences:   1    Standing Expiration Date:   07/10/2020  . Lactate dehydrogenase (LDH)    Standing Status:   Future    Number of Occurrences:   1    Standing Expiration Date:   07/10/2020  . TSH    Standing Status:   Future    Number of Occurrences:   1    Standing Expiration Date:   07/10/2020  .   Iron and TIBC    Standing Status:   Future    Number of Occurrences:   1    Standing Expiration Date:   07/10/2020  . Ferritin    Standing Status:   Future    Number of Occurrences:   1    Standing Expiration Date:   07/10/2020  . Vitamin B12    Standing Status:   Future    Number of Occurrences:   1    Standing Expiration Date:   07/10/2020  . Folate, Serum    Standing Status:   Future    Number of Occurrences:   1    Standing Expiration Date:   07/10/2020  . Methylmalonic acid, serum    Standing Status:   Future    Number of Occurrences:   1    Standing Expiration Date:   07/10/2020  . SPEP with reflex to IFE    Standing Status:   Future    Number of Occurrences:   1    Standing Expiration Date:   07/10/2020  . Kappa/lambda light chains    Standing Status:   Future    Number of Occurrences:   1    Standing Expiration Date:   07/10/2020  . Erythropoietin    Standing Status:   Future    Number of Occurrences:   1    Standing Expiration Date:   07/10/2020  . C-reactive protein    Standing Status:   Future    Number of Occurrences:   1    Standing Expiration Date:   07/10/2020  . Copper, serum    Standing Status:   Future    Number of Occurrences:   1    Standing Expiration Date:   07/10/2020  . Homocysteine, serum     Standing Status:   Future    Number of Occurrences:   1    Standing Expiration Date:   07/10/2020    All questions were answered. The patient knows to call the clinic with any problems, questions or concerns.  A total of more than 60 minutes were spent on this encounter and over half of that time was spent on counseling and coordination of care as outlined above.   John T. Dorsey, MD Department of Hematology/Oncology [facility] at [facility] Phone: [phone] Pager: [phone] Email: [email or web address]  07/12/2019 4:07 PM   

## 2019-07-11 ENCOUNTER — Inpatient Hospital Stay: Payer: Medicare Other | Admitting: Hematology and Oncology

## 2019-07-11 ENCOUNTER — Other Ambulatory Visit: Payer: Self-pay

## 2019-07-11 ENCOUNTER — Inpatient Hospital Stay: Payer: Medicare Other | Attending: Hematology and Oncology

## 2019-07-11 VITALS — BP 160/80 | HR 60 | Temp 98.2°F | Resp 17 | Ht 66.0 in | Wt 168.0 lb

## 2019-07-11 DIAGNOSIS — E785 Hyperlipidemia, unspecified: Secondary | ICD-10-CM

## 2019-07-11 DIAGNOSIS — R42 Dizziness and giddiness: Secondary | ICD-10-CM | POA: Diagnosis not present

## 2019-07-11 DIAGNOSIS — I509 Heart failure, unspecified: Secondary | ICD-10-CM | POA: Diagnosis not present

## 2019-07-11 DIAGNOSIS — Z87891 Personal history of nicotine dependence: Secondary | ICD-10-CM

## 2019-07-11 DIAGNOSIS — E119 Type 2 diabetes mellitus without complications: Secondary | ICD-10-CM

## 2019-07-11 DIAGNOSIS — Z923 Personal history of irradiation: Secondary | ICD-10-CM | POA: Insufficient documentation

## 2019-07-11 DIAGNOSIS — R238 Other skin changes: Secondary | ICD-10-CM | POA: Diagnosis not present

## 2019-07-11 DIAGNOSIS — Z808 Family history of malignant neoplasm of other organs or systems: Secondary | ICD-10-CM | POA: Diagnosis not present

## 2019-07-11 DIAGNOSIS — D509 Iron deficiency anemia, unspecified: Secondary | ICD-10-CM | POA: Diagnosis not present

## 2019-07-11 DIAGNOSIS — C61 Malignant neoplasm of prostate: Secondary | ICD-10-CM | POA: Diagnosis not present

## 2019-07-11 DIAGNOSIS — R5383 Other fatigue: Secondary | ICD-10-CM | POA: Insufficient documentation

## 2019-07-11 DIAGNOSIS — D649 Anemia, unspecified: Secondary | ICD-10-CM

## 2019-07-11 DIAGNOSIS — I2510 Atherosclerotic heart disease of native coronary artery without angina pectoris: Secondary | ICD-10-CM | POA: Diagnosis not present

## 2019-07-11 DIAGNOSIS — R110 Nausea: Secondary | ICD-10-CM

## 2019-07-11 LAB — CMP (CANCER CENTER ONLY)
ALT: 10 U/L (ref 0–44)
AST: 14 U/L — ABNORMAL LOW (ref 15–41)
Albumin: 3.3 g/dL — ABNORMAL LOW (ref 3.5–5.0)
Alkaline Phosphatase: 83 U/L (ref 38–126)
Anion gap: 9 (ref 5–15)
BUN: 14 mg/dL (ref 8–23)
CO2: 28 mmol/L (ref 22–32)
Calcium: 8.7 mg/dL — ABNORMAL LOW (ref 8.9–10.3)
Chloride: 106 mmol/L (ref 98–111)
Creatinine: 1.05 mg/dL (ref 0.61–1.24)
GFR, Est AFR Am: >60 mL/min (ref >60)
GFR, Estimated: >60 mL/min (ref >60)
Glucose, Bld: 111 mg/dL — ABNORMAL HIGH (ref 70–99)
Potassium: 4.1 mmol/L (ref 3.5–5.1)
Sodium: 143 mmol/L (ref 135–145)
Total Bilirubin: 1 mg/dL (ref 0.3–1.2)
Total Protein: 6.5 g/dL (ref 6.5–8.1)

## 2019-07-11 LAB — CBC WITH DIFFERENTIAL (CANCER CENTER ONLY)
Abs Immature Granulocytes: 0.02 10*3/uL (ref 0.00–0.07)
Basophils Absolute: 0 10*3/uL (ref 0.0–0.1)
Basophils Relative: 1 %
Eosinophils Absolute: 0.2 10*3/uL (ref 0.0–0.5)
Eosinophils Relative: 6 %
HCT: 31.7 % — ABNORMAL LOW (ref 39.0–52.0)
Hemoglobin: 10.3 g/dL — ABNORMAL LOW (ref 13.0–17.0)
Immature Granulocytes: 1 %
Lymphocytes Relative: 21 %
Lymphs Abs: 0.9 10*3/uL (ref 0.7–4.0)
MCH: 25.2 pg — ABNORMAL LOW (ref 26.0–34.0)
MCHC: 32.5 g/dL (ref 30.0–36.0)
MCV: 77.5 fL — ABNORMAL LOW (ref 80.0–100.0)
Monocytes Absolute: 0.5 10*3/uL (ref 0.1–1.0)
Monocytes Relative: 11 %
Neutro Abs: 2.7 10*3/uL (ref 1.7–7.7)
Neutrophils Relative %: 60 %
Platelet Count: 176 10*3/uL (ref 150–400)
RBC: 4.09 MIL/uL — ABNORMAL LOW (ref 4.22–5.81)
RDW: 17 % — ABNORMAL HIGH (ref 11.5–15.5)
WBC Count: 4.4 10*3/uL (ref 4.0–10.5)
nRBC: 0 % (ref 0.0–0.2)

## 2019-07-11 LAB — FOLATE: Folate: 15.2 ng/mL (ref >5.9)

## 2019-07-11 LAB — RETIC PANEL
Immature Retic Fract: 28.9 % — ABNORMAL HIGH (ref 2.3–15.9)
RBC.: 4.07 MIL/uL — ABNORMAL LOW (ref 4.22–5.81)
Retic Count, Absolute: 78.1 10*3/uL (ref 19.0–186.0)
Retic Ct Pct: 1.9 % (ref 0.4–3.1)
Reticulocyte Hemoglobin: 29.1 pg (ref >27.9)

## 2019-07-11 LAB — VITAMIN B12: Vitamin B-12: 208 pg/mL (ref 180–914)

## 2019-07-11 LAB — SEDIMENTATION RATE: Sed Rate: 17 mm/hr — ABNORMAL HIGH (ref 0–16)

## 2019-07-11 LAB — C-REACTIVE PROTEIN: CRP: 4.3 mg/dL — ABNORMAL HIGH (ref <1.0)

## 2019-07-11 LAB — SAVE SMEAR(SSMR), FOR PROVIDER SLIDE REVIEW

## 2019-07-12 LAB — HOMOCYSTEINE: Homocysteine: 10.6 umol/L (ref 0.0–21.3)

## 2019-07-14 ENCOUNTER — Telehealth: Payer: Self-pay | Admitting: Hematology and Oncology

## 2019-07-14 LAB — PROTEIN ELECTROPHORESIS, SERUM, WITH REFLEX
A/G Ratio: 1.1 (ref 0.7–1.7)
Albumin ELP: 3.3 g/dL (ref 2.9–4.4)
Alpha-1-Globulin: 0.2 g/dL (ref 0.0–0.4)
Alpha-2-Globulin: 0.7 g/dL (ref 0.4–1.0)
Beta Globulin: 0.7 g/dL (ref 0.7–1.3)
Gamma Globulin: 1.2 g/dL (ref 0.4–1.8)
Globulin, Total: 2.9 g/dL (ref 2.2–3.9)
Total Protein ELP: 6.2 g/dL (ref 6.0–8.5)

## 2019-07-14 LAB — ERYTHROPOIETIN: Erythropoietin: 31.4 m[IU]/mL — ABNORMAL HIGH (ref 2.6–18.5)

## 2019-07-14 LAB — TSH: TSH: 1.269 u[IU]/mL (ref 0.320–4.118)

## 2019-07-14 LAB — LACTATE DEHYDROGENASE: LDH: 163 U/L (ref 98–192)

## 2019-07-14 LAB — IRON AND TIBC
Iron: 40 ug/dL — ABNORMAL LOW (ref 42–163)
Saturation Ratios: 18 % — ABNORMAL LOW (ref 20–55)
TIBC: 220 ug/dL (ref 202–409)
UIBC: 180 ug/dL (ref 117–376)

## 2019-07-14 LAB — FERRITIN: Ferritin: 163 ng/mL (ref 24–336)

## 2019-07-14 LAB — KAPPA/LAMBDA LIGHT CHAINS
Kappa free light chain: 29.2 mg/L — ABNORMAL HIGH (ref 3.3–19.4)
Kappa, lambda light chain ratio: 1.15 (ref 0.26–1.65)
Lambda free light chains: 25.3 mg/L (ref 5.7–26.3)

## 2019-07-14 NOTE — Telephone Encounter (Signed)
Scheduled per los. Called and left msg. Mailed printout  °

## 2019-07-16 LAB — METHYLMALONIC ACID, SERUM: Methylmalonic Acid, Quantitative: 190 nmol/L (ref 0–378)

## 2019-07-17 LAB — COPPER, SERUM: Copper: 126 ug/dL (ref 72–166)

## 2019-07-18 ENCOUNTER — Telehealth: Payer: Self-pay | Admitting: *Deleted

## 2019-07-18 NOTE — Telephone Encounter (Signed)
Received call from pt's wife requesting review of lab tests done on 07/11/19.  Please advise

## 2019-07-19 ENCOUNTER — Telehealth: Payer: Self-pay | Admitting: Hematology and Oncology

## 2019-07-19 DIAGNOSIS — D500 Iron deficiency anemia secondary to blood loss (chronic): Secondary | ICD-10-CM

## 2019-07-19 NOTE — Telephone Encounter (Signed)
Called Mr. Berding to discuss the results of his recent labwork. His findings were consistent with an iron deficiency anemia in the setting of background inflammation (source of inflammation is unclear, no inflammatory conditions). His iron sat is 18% with ferritin 163 (which may be falsely elevated as an acute phase reactant). His reticulocyte index is 0.96, which is consistent with hypoproduction (which supports either iron deficiency or adverse radiation effect). His decrease MCV is also more supportive of iron deficiency.   At this time we recommend GI evaluation for consideration of colonoscopy and continued close f/u in our clinic, with another visit in 3 months to reassess.  Ledell Peoples, MD Department of Hematology/Oncology [place] at [facility] Phone: [phone] Pager: [phone] Email: [email or web address]

## 2019-07-25 IMAGING — CT CT ANGIO CHEST
2 of 6 series · 18 of 46 positions shown · IV contrast (ISOVUE)
Comparison: PET/CT performed 04/04/2018

CLINICAL DATA: Acute onset of left lower leg pain and swelling.
Shortness of breath. Patient on Plavix and aspirin.

EXAM:
CT ANGIOGRAPHY CHEST WITH CONTRAST
TECHNIQUE: Multidetector CT imaging of the chest was performed using the
standard protocol during bolus administration of intravenous
contrast. Multiplanar CT image reconstructions and MIPs were
obtained to evaluate the vascular anatomy.
CONTRAST:  100mL SF4I33-1NY IOPAMIDOL (SF4I33-1NY) INJECTION 76%

[Series 5: thins · axial · 0.71mm/px · z∈[+186,+446]mm · 15 of 287 slices shown]
[im 13/287  lung]
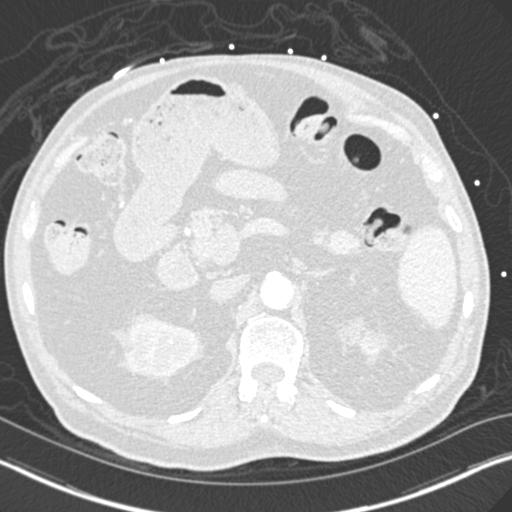
[im 38/287  soft-tissue]
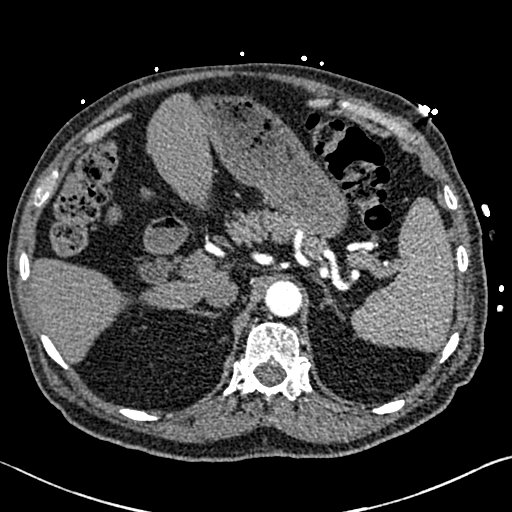
[im 50/287  lung]
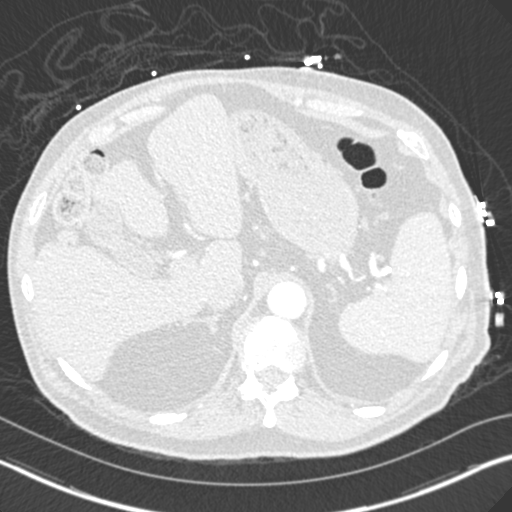
[im 75/287  soft-tissue]
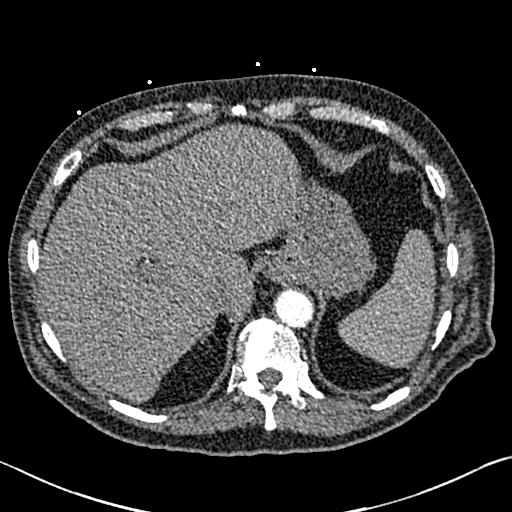
[im 88/287  lung]
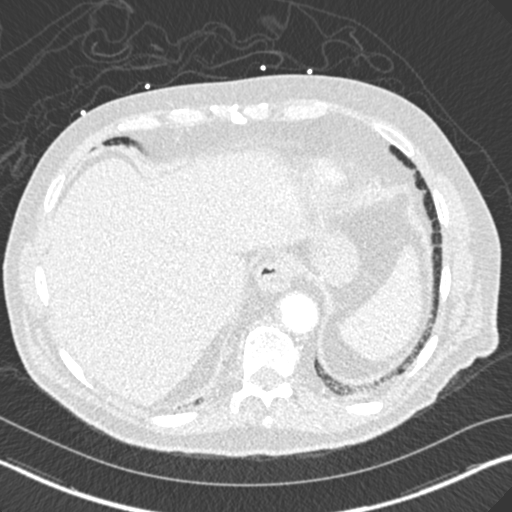
[im 112/287  soft-tissue]
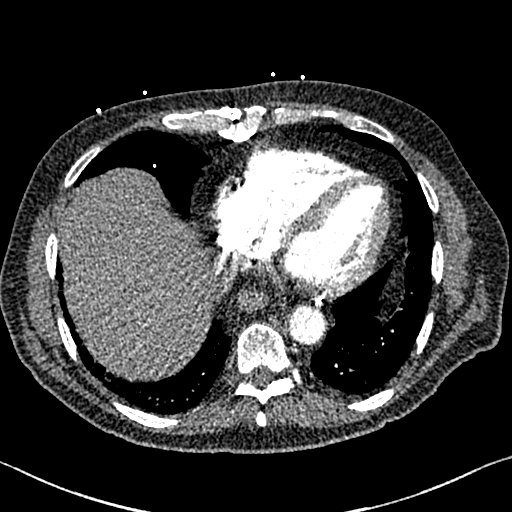
[im 125/287  lung]
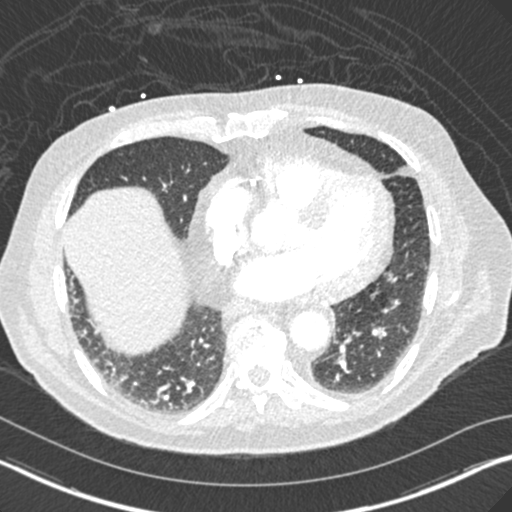
[im 150/287  soft-tissue]
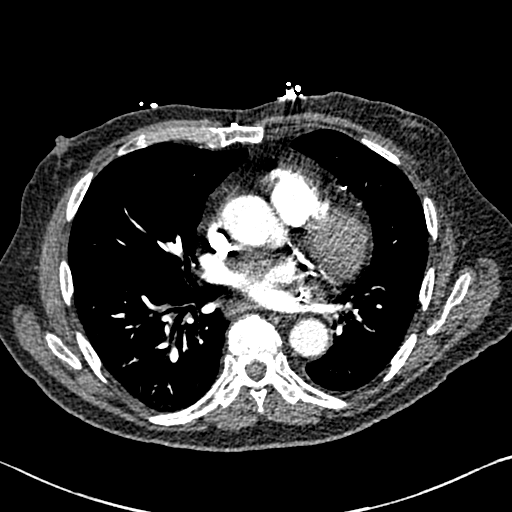
[im 162/287  lung]
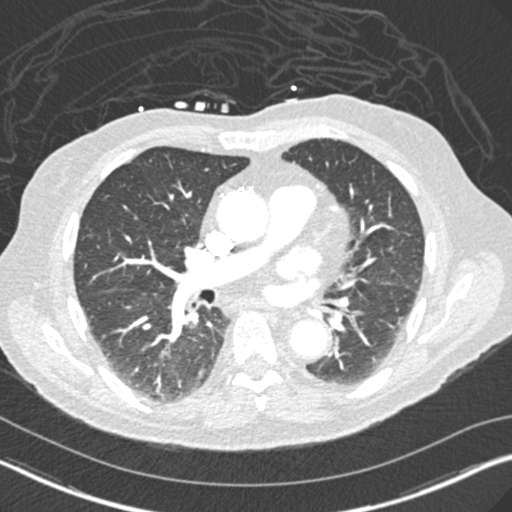
[im 175/287  soft-tissue]
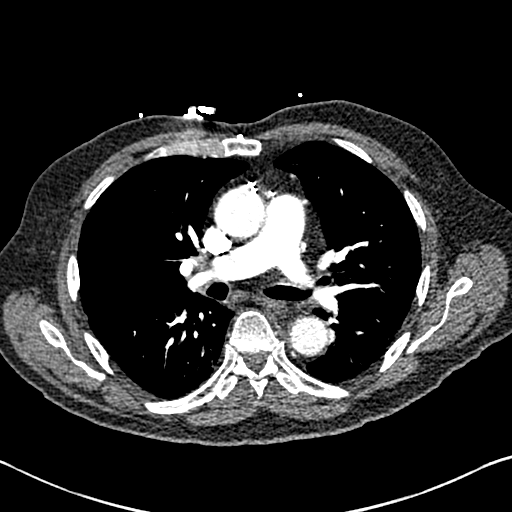
[im 199/287  lung]
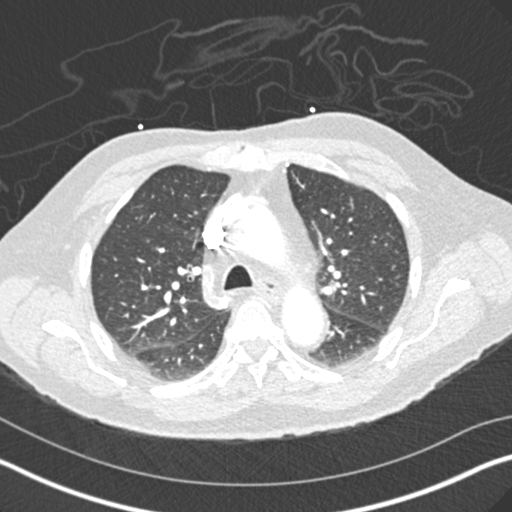
[im 212/287  soft-tissue]
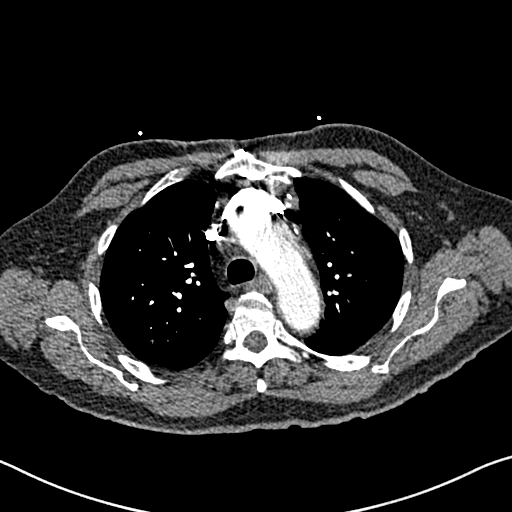
[im 237/287  lung]
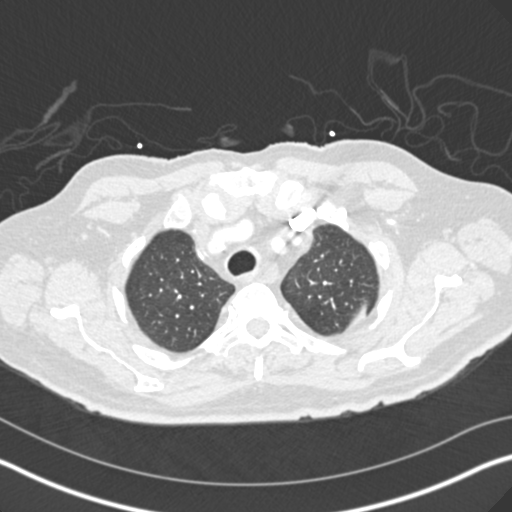
[im 249/287  soft-tissue]
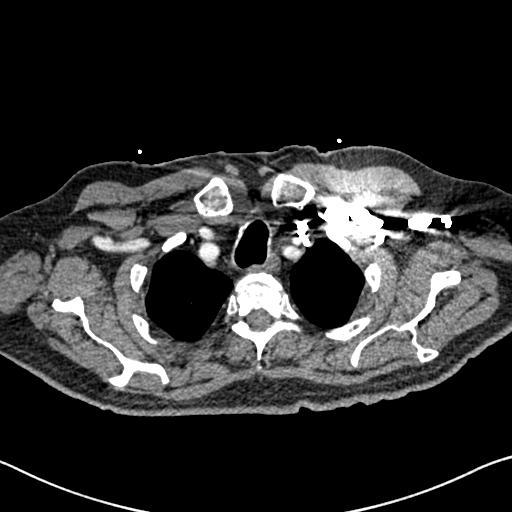
[im 274/287  lung]
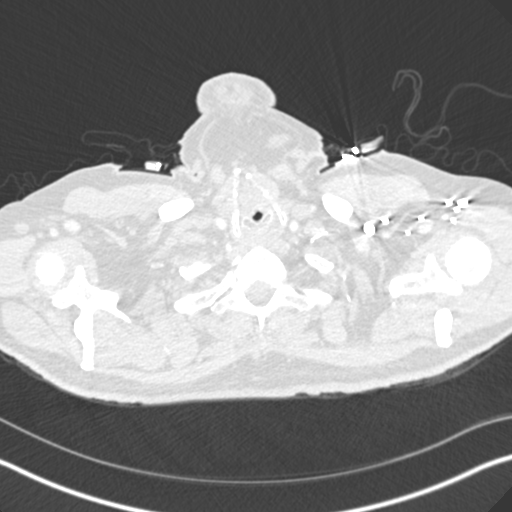

[Series 7: coronal mpr · coronal · 0.58mm/px · 3 of 136 slices shown]
[im 34/136  soft-tissue]
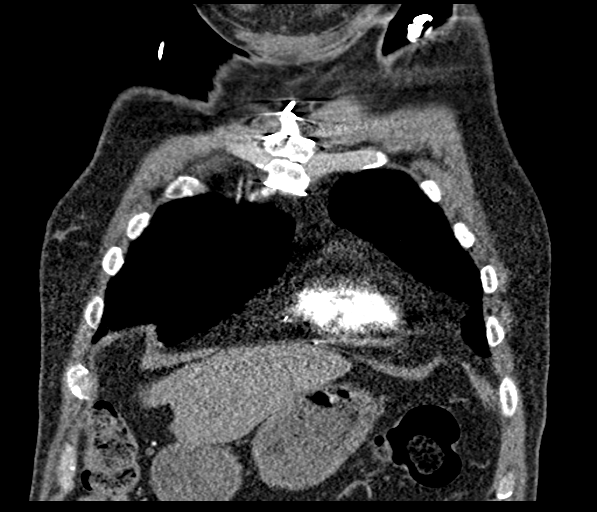
[im 68/136  soft-tissue]
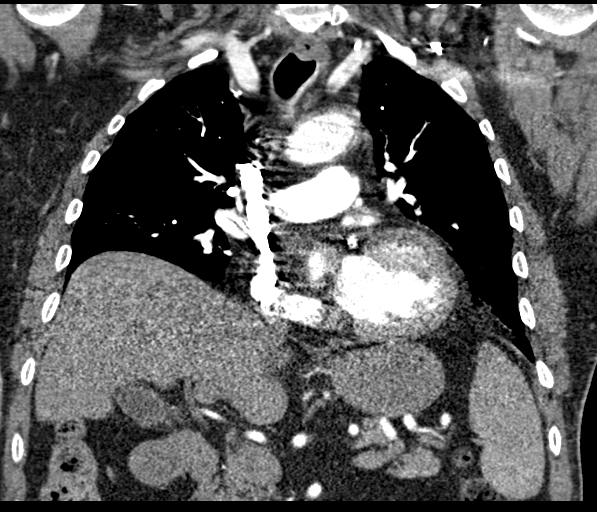
[im 102/136  soft-tissue]
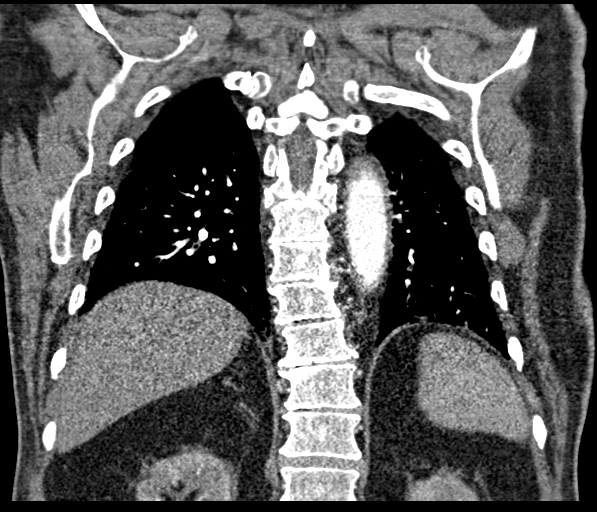

[18 of 46 positions shown; findings below may reference images not displayed]

FINDINGS: Cardiovascular:  There is no evidence of pulmonary embolus.

The heart is normal in size. Scattered coronary artery
calcifications are seen. The patient is status post median
sternotomy. Mild calcification is noted at the aortic arch. The
great vessels are grossly unremarkable in appearance.

Mediastinum/Nodes: The mediastinum is unremarkable in appearance. No
mediastinal lymphadenopathy is seen. No pericardial effusion is
identified. The thyroid gland is diminutive and grossly
unremarkable. No axillary lymphadenopathy is seen.

Lungs/Pleura: Minimal bibasilar atelectasis is noted. No pleural
effusion or pneumothorax is seen. No masses are identified.

Upper Abdomen: The visualized portions of the liver and spleen are
unremarkable. There is question of slight haziness about the
gallbladder. Would correlate clinically for any right upper quadrant
symptoms. The visualized portions of the pancreas. Adrenal glands
are within normal limits. Nonspecific perinephric stranding is noted
bilaterally.

Musculoskeletal: No acute osseous abnormalities are identified. The
visualized musculature is unremarkable in appearance.

Review of the MIP images confirms the above findings.
IMPRESSION: 1. No evidence of pulmonary embolus.
2. Minimal bibasilar atelectasis noted. Lungs otherwise clear.
3. Scattered coronary artery calcifications seen.
4. Question of slight haziness about the gallbladder. Would
correlate clinically for any right upper quadrant symptoms.

## 2019-08-25 ENCOUNTER — Other Ambulatory Visit: Payer: Self-pay | Admitting: Cardiovascular Disease

## 2019-08-26 ENCOUNTER — Other Ambulatory Visit: Payer: Self-pay | Admitting: Cardiovascular Disease

## 2019-08-26 MED ORDER — CLOPIDOGREL BISULFATE 75 MG PO TABS: 75.0000 mg | ORAL_TABLET | Freq: Every day | ORAL | 2 refills | Status: DC

## 2019-09-08 ENCOUNTER — Other Ambulatory Visit: Payer: Self-pay | Admitting: Cardiovascular Disease

## 2019-10-01 ENCOUNTER — Encounter: Payer: Self-pay | Admitting: Cardiovascular Disease

## 2019-10-01 NOTE — Progress Notes (Signed)
Cardiology Office Note   Date:  10/01/2019   ID:  Benjamin Harrison, DOB [birth year], MRN [id number]  PCP:  Leonard Downing, MD  Cardiologist:   Mertie Moores, MD   Chief Complaint  Patient presents with  . Coronary Artery Disease   1. Coronary artery disease: Status post CABG in2001. He status post stenting of his mid right coronary artery in 10/17/2010 2. Hypertension 3. Hypothyroidism 4. Hyperlipidemia 5. Diabetes mellitus    Benjamin Harrison is a 85 year old gentleman with a history of coronary artery disease. He status post PTCA and stenting of his right coronary. He had repeat stenting of his right coronary artery as recently as March, 2012. He had another heart attack and had severe stenosis of his distal right coronary artery stent. Dr. Martinique opened up the stent in March, 2013. Saphenous vein graft to right coronary artery is occluded.  Since that time he's had persistent headaches and generalized weakness. He's continued to have some episodes of chest pain. He's also had some episodes of lightheadedness.  He's feeling a bit better since I saw him several months ago. He still is not able to exercise with about 15 or 20 minutes. Is clear that he still eats salty foods. He enjoys peanut butter on Ritz crackers almost every night. He also eats occasional country ham and gravy biscuits.  October 07, 2012  He is doing well. He had a cath in November, 2013. He has been on Imdur and is feeling better. He occasionally has a headache.   Nov. 24, 2014:  Benjamin Harrison is having more dyspnea with exertion recently - walking outside, taking a shower.  He avoids salt.  He has not had much if any chest pain.   Feb. 24, 2015: Benjamin Harrison is doing ok. He was having some dyspnea but this has resolved.   March 23, 2014: He is doing ok. He stopped taking his atorvastatin due to leg aches. His aches and pains are better since in the atorvastatin. He has lots of problems with  arthritis and has taken steroids. Staying active except as limited by his joints    Feb. 22, 2016: Benjamin Harrison is a 85 y.o. male who presents for follow up of his CAD. He has been having some chest tightness and pain.  Occurred at night.  Was very weak for 3 days. Was just getting ready for bed.   Radiated out to his shoulder.  Occasionally has exertional CP / last for several minutes.  No palpitations,  No dizziness  ,  Does have some orthostatic hypotension.   Dec 04, 2014: Benjamin Harrison was seen recently for some chest pain   Myoview shows: Overall Impression: There is a small area of moderate scar affecting the apical cap and the apical lateral segment. There is no ischemia. This is a low risk scan. The description of the study from December, 2014 is slightly different from this study. However, after careful review, I am not convinced that there has been any change. There is question of an area of insignificant scar near the apex. There is no ischemia.  LV Ejection Fraction: 58%. LV Wall Motion: Normal Wall Motion   Echo shows: Left ventricle: The cavity size was normal. There was mild focal basal hypertrophy of the septum. Systolic function was normal. The estimated ejection fraction was in the range of 55% to 60%. Probable mild hypokinesis of the basal-midinferior myocardium. Left ventricular diastolic function parameters were normal. - Aortic valve: There was trivial regurgitation.  Aug.  3, 2016:  Doing well. No CP.  We added Imdur at his last visit and is feeling much better.   Has DOE still His CP and dyspnea are better on the Imdur   Jan. 30, 2017:  Doing well.  Occasional CP .  Takes NTG on occasion .  Perhaps once a month   Aug. 29, 2017:  Benjamin Harrison had PCI ( angiosculp cutting balloon ) of his mid and distal right coronary artery on 12/06/2015. Breathing better.   Has taken 1-2 NTG .   Staying active Works out at [employer] 3 days a week.    Gets tired  quicker than he wants to . Also wants to decrease his Nexium Advised Pepcid complete  Feb. 13, 2018:  Benjamin Harrison was hospitalized with gastroenteritis with SIRS in January. He has recovered nicely. He was found to have a small 3.5 cm abdominal aortic aneurysm incidentally.  Doing OK from a cardiac standpoint. Has some occasional chest discomfort .   Does not worsen with exertion .   He thinks it may be gas.    04/27/2017: Hurts is seen today for follow-up of his coronary artery disease, hypertension, hyperlipidemia.  C/o right leg pain and burning. Feels cold .  Worse with exertion, better with rest. Has been there for 3 months  Hs some chest pain .   Rest and exertion . Takes NTG Has some DOE  Nov. 1, 2019: Has been diagnosed with prostate cancer  PSA is 37.  Has metastised to some lymph nodes and ribs.  Will be getting Lupron injections and XRT  No CP or dyspnea  Has mild CP if he takes it easy .   April 03, 2019: Benjamin Harrison is doing fairly well.  He has a history of coronary artery disease, hypertension, hyperlipidemia. Has gotten weaker recently .  Has had some episodes of angina that runs under his left arm Occurs spontaneously .  Not necessarily with exercise.  Advised him to try NTG SL and also to try mylanta  Has not been able to exercise much at all .  Various aches and pain .   Very weak from the Lupron injections   Had lots of joint pains.   He held his atorvastatin starting last week    October 02, 2019  Benjamin Harrison is seen tody for follow up of his CAD  Doing ok Is having some palpitations.  HR is irrg today  Quivering in his chest  Continuing to battle prostate cancer  Not Able to get out much , leg weakness and leg pain  No CP   Past Medical History:  Diagnosis Date  . AAA (abdominal aortic aneurysm) ([place])   . CHF (congestive heart failure) ([place])   . Coronary artery disease    a. CABG 2001 with early failure of VG-RCA. b. Inf MI after Lexiscan 01/28/2009 s/p  RCA stent. c. Stent to dRCA 02/24/09. d. DES to [place]  2012. e. NSTEMI s/p angioplasty for ISR of RCA 09/2011. f. 05/2012 Cath: LM 20-30p, LAD 100p, LCX min irregs, RCA patent prox stent, 25m ISR, VG->Diag nl w/ dzs in D1 prox to graft, VG->RCA 100, VG->OM nl, LIMA->LAD nl, Nl EF->Med Rx; g. 09/2014 low-risk MV.  Marland Kitchen Dyspnea   . GERD (gastroesophageal reflux disease)   . History of echocardiogram    a. 09/2014 Echo: EF 55-60%, mild basal-midinferior HK, triv AI.  Marland Kitchen Hyperlipidemia   . Hypertension   . Hypertensive heart disease   . Hypothyroidism   .  Myocardial infarction ([place])   . Osteoarthritis    a.End-stage osteoarthritis, right knee, medial compartment  . Personal history of tobacco use   . Prostate cancer ([place])   . Skin cancer    melanoma  . Type II diabetes mellitus ([place])     Past Surgical History:  Procedure Laterality Date  . CARDIAC CATHETERIZATION    . CARDIAC CATHETERIZATION N/A 12/06/2015   Procedure: Left Heart Cath and Cors/Grafts Angiography;  Surgeon: Burnell Blanks, MD;  Location: [place] [facility];  Service: Cardiovascular;  Laterality: N/A;  . CARDIAC CATHETERIZATION N/A 12/06/2015   Procedure: Coronary Balloon Angioplasty;  Surgeon: Burnell Blanks, MD;  Location: [place] [facility];  Service: Cardiovascular;  Laterality: N/A;  . CATARACT EXTRACTION    . CORONARY ARTERY BYPASS GRAFT    . EYE SURGERY    . HERNIA REPAIR  1985  . INTRAVASCULAR ULTRASOUND/IVUS N/A 10/15/2017   Procedure: Intravascular Ultrasound/IVUS;  Surgeon: Belva Crome, MD;  Location: [facility];  Service: Cardiovascular;  Laterality: N/A;  . KNEE ARTHROSCOPY  1999 and 2004  . LEFT HEART CATH AND CORS/GRAFTS ANGIOGRAPHY N/A 10/15/2017   Procedure: LEFT HEART CATH AND CORS/GRAFTS ANGIOGRAPHY;  Surgeon: Belva Crome, MD;  Location: [facility];  Service: Cardiovascular;  Laterality: N/A;  . LEFT HEART CATHETERIZATION WITH CORONARY ANGIOGRAM N/A 10/09/2011   Procedure:  LEFT HEART CATHETERIZATION WITH CORONARY ANGIOGRAM;  Surgeon: Peter M Martinique, MD;  [facility];  Service: Cardiovascular;  Laterality: N/A;  . LEFT HEART CATHETERIZATION WITH CORONARY/GRAFT ANGIOGRAM N/A 06/06/2012   Procedure: LEFT HEART CATHETERIZATION WITH Beatrix Fetters;  Surgeon: Peter M Martinique, MD;  Location: [facility] C[facility];  Service: Cardiovascular;  Laterality: N/A;     Current Outpatient Medications  Medication Sig Dispense Refill  . acetaminophen (TYLENOL) 500 MG tablet Take 1,000 mg by mouth as needed for mild pain.    Marland Kitchen aspirin 81 MG tablet Take 1 tablet (81 mg total) by mouth daily.    . clopidogrel (PLAVIX) 75 MG tablet Take 1 tablet (75 mg total) by mouth daily. 90 tablet 2  . Ferrous Sulfate (IRON PO) Take 1 tablet by mouth daily.    . fluocinonide (LIDEX) 0.05 % external solution Apply 1 mL topically 2 (two) times daily as needed (dryness and itching).   3  . hydrochlorothiazide (HYDRODIURIL) 25 MG tablet Take 1 tablet (25 mg total) by mouth daily. 90 tablet 3  . isosorbide mononitrate (IMDUR) 120 MG 24 hr tablet TAKE 1 TABLET BY MOUTH  DAILY 90 tablet 3  . levothyroxine (SYNTHROID, LEVOTHROID) 100 MCG tablet Take 100 mcg by mouth daily before breakfast.     . lisinopril (ZESTRIL) 10 MG tablet TAKE 1 TABLET BY MOUTH  DAILY 90 tablet 2  . metoprolol tartrate (LOPRESSOR) 25 MG tablet TAKE 1 TABLET BY MOUTH TWO  TIMES DAILY 180 tablet 3  . ondansetron (ZOFRAN ODT) 4 MG disintegrating tablet Take 1 tablet (4 mg total) by mouth every 8 (eight) hours as needed. 10 tablet 0  . pantoprazole (PROTONIX) 40 MG tablet TAKE 1 TABLET BY MOUTH  TWICE DAILY 180 tablet 2  . Polyethyl Glycol-Propyl Glycol (SYSTANE FREE OP) Place 1 drop into both eyes 3 (three) times daily as needed (for dry eyes.).     Marland Kitchen potassium chloride (K-DUR) 10 MEQ tablet TAKE 1 TABLET BY MOUTH AT  NIGHT 90 tablet 2  . rosuvastatin (CRESTOR) 20 MG tablet Take 1 tablet (20 mg total)  by mouth daily. 90 tablet  3  . tamsulosin (FLOMAX) 0.4 MG CAPS capsule Take 0.4 mg by mouth daily.  6   No current facility-administered medications for this visit.    Allergies:   Codeine    Social History:  The patient  reports that he quit smoking about 44 years ago. His smoking use included cigarettes. He has a 20.00 pack-year smoking history. He has never used smokeless tobacco. He reports that he does not drink alcohol or use drugs.   Family History:  The patient's family history includes Heart disease in his brother and brother; Hypertension in his mother; Lung cancer in his sister and son; Pneumonia in his brother; Prostate cancer in his brother, son, and another family member; Throat cancer in his sister; Unexplained death in his brother, brother, and mother.    ROS:  Please see the history of present illness.   Physical Exam: There were no vitals taken for this visit.  GEN:  Well nourished, well developed in no acute distress HEENT: Normal NECK: No JVD; No carotid bruits LYMPHATICS: No lymphadenopathy CARDIAC: RRR ,  Occasional Premature beats.   RESPIRATORY:  Clear to auscultation without rales, wheezing or rhonchi  ABDOMEN: Soft, non-tender, non-distended MUSCULOSKELETAL:  No edema; No deformity  SKIN: Warm and dry NEUROLOGIC:  Alert and oriented x 3     EKG:   October 02, 2019:  Sinus brady at 23.  No ST or T wave changes.   Recent Labs: 07/11/2019: ALT 10; BUN 14; Creatinine 1.05; Hemoglobin 10.3; Platelet Count 176; Potassium 4.1; Sodium 143; TSH 1.269    Lipid Panel    Component Value Date/Time   CHOL 81 (L) 07/03/2019 0729   TRIG 110 07/03/2019 0729   HDL 27 (L) 07/03/2019 0729   CHOLHDL 3.0 07/03/2019 0729   CHOLHDL 4.1 03/22/2016 0959   VLDL 23 03/22/2016 0959   LDLCALC 33 07/03/2019 0729      Wt Readings from Last 3 Encounters:  07/11/19 168 lb (76.2 kg)  04/03/19 169 lb (76.7 kg)  07/17/18 157 lb (71.2 kg)      Other studies Reviewed: Additional studies/ records  that were reviewed today include: . Review of the above records demonstrates:    ASSESSMENT AND PLAN:  1. Coronary artery disease:  Rare episodes of angina .  He overall seems to be doing fairly well.  Is not having any angina.  2. Hypertension - BP is basically well controlled.    3. Hypothyroidism - Managed by Dr. Arelia Sneddon   4. Hyperlipidemia - will check lipids next visit   5. Diabetes mellitusv- glucose is running a bit elevated of the glucontrol.   He will discuss with Dr. Arelia Sneddon.   Current medicines are reviewed at length with the patient today.  The patient does not have concerns regarding medicines.  The following changes have been made:  See above.    Disposition:   FU with me in 6 months    Signed, Mertie Moores, MD  10/01/2019 8:50 PM    [facility] [address], [place], [place]  [address] Phone: [phone]; Fax: [phone]

## 2019-10-02 ENCOUNTER — Ambulatory Visit: Payer: Medicare Other | Admitting: Cardiovascular Disease

## 2019-10-02 ENCOUNTER — Other Ambulatory Visit: Payer: Self-pay

## 2019-10-02 VITALS — BP 118/70 | HR 60 | Ht 66.0 in | Wt 166.5 lb

## 2019-10-02 DIAGNOSIS — I2510 Atherosclerotic heart disease of native coronary artery without angina pectoris: Secondary | ICD-10-CM | POA: Diagnosis not present

## 2019-10-02 DIAGNOSIS — E785 Hyperlipidemia, unspecified: Secondary | ICD-10-CM

## 2019-10-02 DIAGNOSIS — I714 Abdominal aortic aneurysm, without rupture, unspecified: Secondary | ICD-10-CM

## 2019-10-02 DIAGNOSIS — I10 Essential (primary) hypertension: Secondary | ICD-10-CM

## 2019-10-02 NOTE — Patient Instructions (Signed)
Medication Instructions:  Your physician recommends that you continue on your current medications as directed. Please refer to the Current Medication list given to you today.  *If you need a refill on your cardiac medications before your next appointment, please call your pharmacy*   Lab Work: Your physician recommends that you return for lab work in: 6 months on the day of or a few days before your office visit with Dr. [name].  You will need to FAST for this appointment - nothing to eat or drink after midnight the night before except water.  If you have labs (blood work) drawn today and your tests are completely normal, you will receive your results only by: . MyChart Message (if you have MyChart) OR . A paper copy in the mail If you have any lab test that is abnormal or we need to change your treatment, we will call you to review the results.   Testing/Procedures: None Ordered    Follow-Up: At [facility], you and your health needs are our priority.  As part of our continuing mission to provide you with exceptional heart care, we have created designated Provider Care Teams.  These Care Teams include your primary Cardiologist (physician) and Advanced Practice Providers (APPs -  Physician Assistants and Nurse Practitioners) who all work together to provide you with the care you need, when you need it.   Your next appointment:   6 month(s)  The format for your next appointment:   In Person  Provider:   You may see Philip [name], MD or one of the following Advanced Practice Providers on your designated Care Team:    Scott Weaver, PA-C  Vin Bhagat, PA-C  Janine Hammond, NP     

## 2019-10-10 ENCOUNTER — Other Ambulatory Visit: Payer: Self-pay | Admitting: Hematology and Oncology

## 2019-10-10 ENCOUNTER — Inpatient Hospital Stay: Payer: Medicare Other | Attending: Hematology and Oncology | Admitting: Hematology and Oncology

## 2019-10-10 ENCOUNTER — Other Ambulatory Visit: Payer: Self-pay

## 2019-10-10 ENCOUNTER — Inpatient Hospital Stay: Payer: Medicare Other

## 2019-10-10 VITALS — BP 161/100 | HR 80 | Temp 98.3°F | Resp 18 | Ht 66.0 in | Wt 165.8 lb

## 2019-10-10 DIAGNOSIS — Z808 Family history of malignant neoplasm of other organs or systems: Secondary | ICD-10-CM | POA: Insufficient documentation

## 2019-10-10 DIAGNOSIS — C61 Malignant neoplasm of prostate: Secondary | ICD-10-CM | POA: Insufficient documentation

## 2019-10-10 DIAGNOSIS — Z8546 Personal history of malignant neoplasm of prostate: Secondary | ICD-10-CM | POA: Insufficient documentation

## 2019-10-10 DIAGNOSIS — Z87891 Personal history of nicotine dependence: Secondary | ICD-10-CM | POA: Diagnosis not present

## 2019-10-10 DIAGNOSIS — D500 Iron deficiency anemia secondary to blood loss (chronic): Secondary | ICD-10-CM

## 2019-10-10 DIAGNOSIS — D509 Iron deficiency anemia, unspecified: Secondary | ICD-10-CM | POA: Diagnosis present

## 2019-10-10 DIAGNOSIS — E119 Type 2 diabetes mellitus without complications: Secondary | ICD-10-CM | POA: Diagnosis not present

## 2019-10-10 DIAGNOSIS — I2510 Atherosclerotic heart disease of native coronary artery without angina pectoris: Secondary | ICD-10-CM | POA: Insufficient documentation

## 2019-10-10 DIAGNOSIS — Z923 Personal history of irradiation: Secondary | ICD-10-CM | POA: Insufficient documentation

## 2019-10-10 DIAGNOSIS — E785 Hyperlipidemia, unspecified: Secondary | ICD-10-CM | POA: Diagnosis not present

## 2019-10-10 DIAGNOSIS — I509 Heart failure, unspecified: Secondary | ICD-10-CM | POA: Insufficient documentation

## 2019-10-10 DIAGNOSIS — I252 Old myocardial infarction: Secondary | ICD-10-CM | POA: Insufficient documentation

## 2019-10-10 LAB — CBC WITH DIFFERENTIAL (CANCER CENTER ONLY)
Abs Immature Granulocytes: 0.01 10*3/uL (ref 0.00–0.07)
Basophils Absolute: 0 10*3/uL (ref 0.0–0.1)
Basophils Relative: 0 %
Eosinophils Absolute: 0.1 10*3/uL (ref 0.0–0.5)
Eosinophils Relative: 2 %
HCT: 35.6 % — ABNORMAL LOW (ref 39.0–52.0)
Hemoglobin: 11.3 g/dL — ABNORMAL LOW (ref 13.0–17.0)
Immature Granulocytes: 0 %
Lymphocytes Relative: 19 %
Lymphs Abs: 1 10*3/uL (ref 0.7–4.0)
MCH: 24.9 pg — ABNORMAL LOW (ref 26.0–34.0)
MCHC: 31.7 g/dL (ref 30.0–36.0)
MCV: 78.4 fL — ABNORMAL LOW (ref 80.0–100.0)
Monocytes Absolute: 0.7 10*3/uL (ref 0.1–1.0)
Monocytes Relative: 13 %
Neutro Abs: 3.4 10*3/uL (ref 1.7–7.7)
Neutrophils Relative %: 66 %
Platelet Count: 161 10*3/uL (ref 150–400)
RBC: 4.54 MIL/uL (ref 4.22–5.81)
RDW: 17 % — ABNORMAL HIGH (ref 11.5–15.5)
WBC Count: 5.2 10*3/uL (ref 4.0–10.5)
nRBC: 0 % (ref 0.0–0.2)

## 2019-10-10 LAB — CMP (CANCER CENTER ONLY)
ALT: 11 U/L (ref 0–44)
AST: 17 U/L (ref 15–41)
Albumin: 3.7 g/dL (ref 3.5–5.0)
Alkaline Phosphatase: 105 U/L (ref 38–126)
Anion gap: 8 (ref 5–15)
BUN: 21 mg/dL (ref 8–23)
CO2: 28 mmol/L (ref 22–32)
Calcium: 9.1 mg/dL (ref 8.9–10.3)
Chloride: 107 mmol/L (ref 98–111)
Creatinine: 1.07 mg/dL (ref 0.61–1.24)
GFR, Est AFR Am: >60 mL/min (ref >60)
GFR, Estimated: >60 mL/min (ref >60)
Glucose, Bld: 79 mg/dL (ref 70–99)
Potassium: 4 mmol/L (ref 3.5–5.1)
Sodium: 143 mmol/L (ref 135–145)
Total Bilirubin: 0.8 mg/dL (ref 0.3–1.2)
Total Protein: 7 g/dL (ref 6.5–8.1)

## 2019-10-10 LAB — RETIC PANEL
Immature Retic Fract: 23.2 % — ABNORMAL HIGH (ref 2.3–15.9)
RBC.: 4.57 MIL/uL (ref 4.22–5.81)
Retic Count, Absolute: 65.4 10*3/uL (ref 19.0–186.0)
Retic Ct Pct: 1.4 % (ref 0.4–3.1)
Reticulocyte Hemoglobin: 28.9 pg (ref >27.9)

## 2019-10-11 ENCOUNTER — Encounter: Payer: Self-pay | Admitting: Hematology and Oncology

## 2019-10-11 NOTE — Progress Notes (Signed)
[place] [phone]   [phone]  PROGRESS NOTE  Patient Care Team: Leonard Downing, MD as PCP - General (Family Medicine) [name], [name], MD as PCP - Cardiology (Cardiology)  Hematological/Oncological History #Microcytic Anemia 1) 08/08/2016: WBC 11.0, Hgb 14.9, Plt 248. MCV 75.3 2) 10/11/2017: WBC 6.3, Hgb 13.5, Plt 191. MCV 73 3) 06/12/2018: WBC 7.9, Hgb 10.8, Plt 214. MCV 80 4) 07/11/2019: WBC 4.4, Hgb 10.3, Plt 176. MCV 77.5.  5)  07/11/2019: establish care with Dr. Lorenso Courier.   Interval History:  Benjamin Harrison 85 y.o. male with medical history significant for iron deficiency anemia presents for a follow up visit. The patient's last visit was on 07/10/2020. In the interim since the last visit he has had no hospitalizations, ED visits, or other major changes in health.  On exam today Benjamin Harrison notes that he continues to have marked levels of fatigue.  He continues to be short of breath and is not able to exert himself.  He does note that he gets dizzy a lot, particularly when initially standing up.  The patient notes that he has been taking his iron pills as directed once daily in the morning and that they have not been causing him any GI distress or constipation.  He does note that he sleeps poorly at night, but that has been a longstanding chronic issue.  Patient also endorses that he has not had any recent issues with nausea, vomiting, diarrhea, fevers, chills, sweats.  He has been otherwise well other than his fatigue and inability to exert himself.  A full 10 point ROS is listed below  MEDICAL HISTORY:  Past Medical History:  Diagnosis Date  . AAA (abdominal aortic aneurysm) ([place])   . CHF (congestive heart failure) ([place])   . Coronary artery disease    a. CABG 2001 with early failure of VG-RCA. b. Inf MI after Lexiscan 01/28/2009 s/p RCA stent. c. Stent to dRCA 02/24/09. d. DES to [place]  2012. e. NSTEMI s/p angioplasty for ISR of  RCA 09/2011. f. 05/2012 Cath: LM 20-30p, LAD 100p, LCX min irregs, RCA patent prox stent, 68mISR, VG->Diag nl w/ dzs in D1 prox to graft, VG->RCA 100, VG->OM nl, LIMA->LAD nl, Nl EF->Med Rx; g. 09/2014 low-risk MV.  .Marland KitchenDyspnea   . GERD (gastroesophageal reflux disease)   . History of echocardiogram    a. 09/2014 Echo: EF 55-60%, mild basal-midinferior HK, triv AI.  .Marland KitchenHyperlipidemia   . Hypertension   . Hypertensive heart disease   . Hypothyroidism   . Myocardial infarction ([place]   . Osteoarthritis    a.End-stage osteoarthritis, right knee, medial compartment  . Personal history of tobacco use   . [place]   . Skin cancer    melanoma  . Type II diabetes mellitus ([place]     SURGICAL HISTORY: Past Surgical History:  Procedure Laterality Date  . CARDIAC CATHETERIZATION    . CARDIAC CATHETERIZATION N/A 12/06/2015   Procedure: Left Heart Cath and Cors/Grafts Angiography;  Surgeon: CBurnell Blanks MD;  [facility];  Service: Cardiovascular;  Laterality: N/A;  . CARDIAC CATHETERIZATION N/A 12/06/2015   Procedure: Coronary Balloon Angioplasty;  Surgeon: CBurnell Blanks MD;  [facility];  Service: Cardiovascular;  Laterality: N/A;  . CATARACT EXTRACTION    . CORONARY ARTERY BYPASS GRAFT    . EYE SURGERY    . HERNIA REPAIR  1985  . INTRAVASCULAR ULTRASOUND/IVUS N/A  10/15/2017   Procedure: Intravascular Ultrasound/IVUS;  Surgeon: Belva Crome, MD;  Location: [place] [facility] [facility];  Service: Cardiovascular;  Laterality: N/A;  . KNEE ARTHROSCOPY  1999 and 2004  . LEFT HEART CATH AND CORS/GRAFTS ANGIOGRAPHY N/A 10/15/2017   Procedure: LEFT HEART CATH AND CORS/GRAFTS ANGIOGRAPHY;  Surgeon: Belva Crome, MD;  Location: [facility];  Service: Cardiovascular;  Laterality: N/A;  . LEFT HEART CATHETERIZATION WITH CORONARY ANGIOGRAM N/A 10/09/2011   Procedure: LEFT HEART CATHETERIZATION WITH CORONARY ANGIOGRAM;  Surgeon: Peter M Martinique, MD;   Location: [facility] CATH LAB;  Service: Cardiovascular;  Laterality: N/A;  . LEFT HEART CATHETERIZATION WITH CORONARY/GRAFT ANGIOGRAM N/A 06/06/2012   Procedure: LEFT HEART CATHETERIZATION WITH Beatrix Fetters;  Surgeon: Peter M Martinique, MD;  Location: [facility] CATH LAB;  Service: Cardiovascular;  Laterality: N/A;    SOCIAL HISTORY: Social History   Socioeconomic History  . Marital status: Married    Spouse name: Not on file  . Number of children: 2  . Years of education: Not on file  . Highest education level: Not on file  Occupational History  . Not on file  Tobacco Use  . Smoking status: Former Smoker    Packs/day: 1.00    Years: 20.00    Pack years: 20.00    Types: Cigarettes    Quit date: 08/01/1975    Years since quitting: 44.2  . Smokeless tobacco: Never Used  Substance and Sexual Activity  . Alcohol use: No  . Drug use: No  . Sexual activity: Not Currently  Other Topics Concern  . Not on file  Social History Narrative   Lives locally with wife.   Social Determinants of Health   Financial Resource Strain:   . Difficulty of Paying Living Expenses:   Food Insecurity:   . Worried About Charity fundraiser in the Last Year:   . Arboriculturist in the Last Year:   Transportation Needs:   . Film/video editor (Medical):   Marland Kitchen Lack of Transportation (Non-Medical):   Physical Activity:   . Days of Exercise per Week:   . Minutes of Exercise per Session:   Stress:   . Feeling of Stress :   Social Connections:   . Frequency of Communication with Friends and Family:   . Frequency of Social Gatherings with Friends and Family:   . Attends Religious Services:   . Active Member of Clubs or Organizations:   . Attends Archivist Meetings:   Marland Kitchen Marital Status:   Intimate Partner Violence:   . Fear of Current or Ex-Partner:   . Emotionally Abused:   Marland Kitchen Physically Abused:   . Sexually Abused:     FAMILY HISTORY: Family History  Problem Relation Age of Onset  .  Hypertension Mother   . Unexplained death Mother   . Throat cancer Sister   . Lung cancer Sister   . Heart disease Brother   . Pneumonia Brother   . Heart disease Brother   . Prostate cancer Brother   . Unexplained death Brother        at birth  . Unexplained death Brother   . Prostate cancer Son   . Lung cancer Son   . Prostate cancer Other     ALLERGIES:  is allergic to codeine.  MEDICATIONS:  Current Outpatient Medications  Medication Sig Dispense Refill  . metFORMIN (GLUCOPHAGE) 500 MG tablet Take 500 mg by mouth 2 (two) times daily with a meal. Takes  2 tablets in the morning and 1 tablet @@ night    . acetaminophen (TYLENOL) 500 MG tablet Take 1,000 mg by mouth as needed for mild pain.    Marland Kitchen aspirin 81 MG tablet Take 1 tablet (81 mg total) by mouth daily.    . clopidogrel (PLAVIX) 75 MG tablet Take 1 tablet (75 mg total) by mouth daily. 90 tablet 2  . Ferrous Sulfate (IRON PO) Take 1 tablet by mouth daily.    . fluocinonide (LIDEX) 0.05 % external solution Apply 1 mL topically 2 (two) times daily as needed (dryness and itching).   3  . hydrochlorothiazide (HYDRODIURIL) 25 MG tablet Take 1 tablet (25 mg total) by mouth daily. 90 tablet 3  . isosorbide mononitrate (IMDUR) 120 MG 24 hr tablet TAKE 1 TABLET BY MOUTH  DAILY 90 tablet 3  . levothyroxine (SYNTHROID, LEVOTHROID) 100 MCG tablet Take 100 mcg by mouth daily before breakfast.     . lisinopril (ZESTRIL) 10 MG tablet TAKE 1 TABLET BY MOUTH  DAILY 90 tablet 2  . metoprolol tartrate (LOPRESSOR) 25 MG tablet TAKE 1 TABLET BY MOUTH TWO  TIMES DAILY 180 tablet 3  . ondansetron (ZOFRAN ODT) 4 MG disintegrating tablet Take 1 tablet (4 mg total) by mouth every 8 (eight) hours as needed. 10 tablet 0  . pantoprazole (PROTONIX) 40 MG tablet TAKE 1 TABLET BY MOUTH  TWICE DAILY 180 tablet 2  . Polyethyl Glycol-Propyl Glycol (SYSTANE FREE OP) Place 1 drop into both eyes 3 (three) times daily as needed (for dry eyes.).     Marland Kitchen potassium  chloride (K-DUR) 10 MEQ tablet TAKE 1 TABLET BY MOUTH AT  NIGHT 90 tablet 2  . rosuvastatin (CRESTOR) 20 MG tablet Take 1 tablet (20 mg total) by mouth daily. 90 tablet 3  . tamsulosin (FLOMAX) 0.4 MG CAPS capsule Take 0.4 mg by mouth in the morning and at bedtime.     No current facility-administered medications for this visit.    REVIEW OF SYSTEMS:   Constitutional: ( - ) fevers, ( - )  chills , ( - ) night sweats Eyes: ( - ) blurriness of vision, ( - ) double vision, ( - ) watery eyes Ears, nose, mouth, throat, and face: ( - ) mucositis, ( - ) sore throat Respiratory: ( - ) cough, ( - ) dyspnea, ( - ) wheezes Cardiovascular: ( - ) palpitation, ( - ) chest discomfort, ( - ) lower extremity swelling Gastrointestinal:  ( - ) nausea, ( - ) heartburn, ( - ) change in bowel habits Skin: ( - ) abnormal skin rashes Lymphatics: ( - ) new lymphadenopathy, ( - ) easy bruising Neurological: ( - ) numbness, ( - ) tingling, ( - ) new weaknesses Behavioral/Psych: ( - ) mood change, ( - ) new changes  All other systems were reviewed with the patient and are negative.  PHYSICAL EXAMINATION: ECOG PERFORMANCE STATUS: 1 - Symptomatic but completely ambulatory  Vitals:   10/10/19 1500  BP: (!) 161/100  Pulse: 80  Resp: 18  Temp: 98.3 F (36.8 C)  SpO2: 100%   Filed Weights   10/10/19 1500  Weight: 165 lb 12.8 oz (75.2 kg)    GENERAL: well appearing elderly Caucasian male in NAD  SKIN: skin color, texture, turgor are normal, no rashes or significant lesions EYES: conjunctiva are pink and non-injected, sclera clear LUNGS: clear to auscultation and percussion with normal breathing effort HEART: regular rate & rhythm and no murmurs and  no lower extremity edema ABDOMEN: soft, non-tender, non-distended, normal bowel sounds Musculoskeletal: no cyanosis of digits and no clubbing  PSYCH: alert & oriented x 3, fluent speech NEURO: no focal motor/sensory deficits  LABORATORY DATA:  I have reviewed  the data as listed CBC Latest Ref Rng & Units 10/10/2019 07/11/2019 06/12/2018  WBC 4.0 - 10.5 K/uL 5.2 4.4 7.9  Hemoglobin 13.0 - 17.0 g/dL 11.3(L) 10.3(L) 10.8(L)  Hematocrit 39.0 - 52.0 % 35.6(L) 31.7(L) 33.5(L)  Platelets 150 - 400 K/uL 161 176 214    CMP Latest Ref Rng & Units 10/10/2019 07/11/2019 07/03/2019  Glucose 70 - 99 mg/dL 79 111(H) 163(H)  BUN 8 - 23 mg/dL '21 14 15  '$ Creatinine 0.61 - 1.24 mg/dL 1.07 1.05 1.01  Sodium 135 - 145 mmol/L 143 143 142  Potassium 3.5 - 5.1 mmol/L 4.0 4.1 4.1  Chloride 98 - 111 mmol/L 107 106 106  CO2 22 - 32 mmol/L '28 28 23  '$ Calcium 8.9 - 10.3 mg/dL 9.1 8.7(L) 9.2  Total Protein 6.5 - 8.1 g/dL 7.0 6.5 6.0  Total Bilirubin 0.3 - 1.2 mg/dL 0.8 1.0 0.5  Alkaline Phos 38 - 126 U/L 105 83 100  AST 15 - 41 U/L 17 14(L) 13  ALT 0 - 44 U/L '11 10 6     '$ RADIOGRAPHIC STUDIES: None relevant to review.  No results found.  ASSESSMENT & PLAN Benjamin Harrison 85 y.o. male with medical history significant for DM Type II, HLD, CAD, and CHF who presents for evaluation of anemia.  On review of the records it appears that the patient has an acute on chronic microcytic anemia.  The MCV has varied throughout the course of the 10 years of records we have from low normal to low.  His hemoglobin is rarely in the normal range and has been hovering in the 12 for several years.  It appears that in the last year the hemoglobin has trended downward slightly to an average now of approximately hemoglobin of 10.  The possible etiologies for this include iron deficiency anemia from blood loss, likely GI versus primary bone marrow disorder in the form of malignancy or radiation side effect.  It is not uncommon for patients to have extensive radiation of the pelvis to develop cytopenias as result of damage to the bone marrow.  If this were the case recovery would be slow and diagnosis would be difficult, not necessarily confirmed with bone marrow biopsy.  His last las showed an  unclear picture, with iron sat 18%, ferritin 163, and TIBC 220. His reticulocyte hemoglobin was borderline. These findings could be consistent with iron deficiency anemia vs. anemia of chronic disease, though he does not have any known ongoing inflammatory conditions or renal dysfunction. He did have a modest increase in Hgb over the last 3 months up to 11.3 (from 10.3 on 07/11/2019). He is not back up to his baseline >12.0. All other workup (nutritional studies, etc) did not direct [place] to an alternative etiology.   Fortunately at this time the patient's hemoglobin is above the level required for transfusion and therefore we can continue our evaluation for the possible etiology at this time.  Of note his initial peripheral blood film did show increased elliptocytes and anisopoikilocytosis which are nonspecific findings.    #Microcytic Anemia, acute on chronic --findings on last evaluation showed a ferritin 163 (elevated due to inflammation?), iron sat 18%, TIBC 220. His reticulocyte Hgb is borderline. Given that his anemia is microcytic the findings  are most consist with iron deficiency anemia --continue iron sulfate '325mg'$  PO daily with a source of vitamin C  --if patient dose not have improvement in iron labs we will need to consider evaluation for possible GI blood loss --RTC in 3 months to reassess or sooner if required.   #Prostate Cancer s/p Radiation Therapy --s/p radiation therapy at [facility].  --continue to follow with urology for serial PSAs and ADT therapy  --can monitor in our clinic if the patient desires   No orders of the defined types were placed in this encounter.  All questions were answered. The patient knows to call the clinic with any problems, questions or concerns.  A total of more than 30 minutes were spent on this encounter and over half of that time was spent on counseling and coordination of care as outlined above.   Ledell Peoples, MD Department of  Hematology/Oncology [place] [facility] [place] [facility] Phone: [phone] Pager: [phone] Email: [email or web address]  10/11/2019 3:50 PM

## 2019-10-13 LAB — IRON AND TIBC
Iron: 38 ug/dL — ABNORMAL LOW (ref 42–163)
Saturation Ratios: 15 % — ABNORMAL LOW (ref 20–55)
TIBC: 256 ug/dL (ref 202–409)
UIBC: 218 ug/dL (ref 117–376)

## 2019-10-13 LAB — FERRITIN: Ferritin: 115 ng/mL (ref 24–336)

## 2019-10-14 ENCOUNTER — Telehealth: Payer: Self-pay | Admitting: Hematology and Oncology

## 2019-10-14 NOTE — Telephone Encounter (Signed)
Scheduled  Per los. Called and spoke with patient. Confirmed appt  ° °

## 2019-10-20 ENCOUNTER — Telehealth: Payer: Self-pay | Admitting: *Deleted

## 2019-10-20 NOTE — Telephone Encounter (Signed)
Received call from [facility]. She states that pt and wife came into the office for an appt that had not been made with Dr. Michail Sermon.  Shewas asking if this pt needed to be seen right away.  Per Dr. Libby Maw note, he was waiting on next labs due in June to see if pt was responding to his oral iron. Dr. Lorenso Courier was suspecting GI loss, chronic, not acute. Scheduler stated that Dr. Michail Sermon was seeing pt's in the hospital this week and is off next week. She is asking if early April is adequate appt timeframe.  Advised that early April is fine.  She will contact the patient. No other questions voiced.

## 2019-10-29 ENCOUNTER — Other Ambulatory Visit (HOSPITAL_COMMUNITY): Payer: Self-pay | Admitting: Urology

## 2019-10-29 DIAGNOSIS — R9721 Rising PSA following treatment for malignant neoplasm of prostate: Secondary | ICD-10-CM

## 2019-11-06 ENCOUNTER — Ambulatory Visit (HOSPITAL_COMMUNITY)
Admission: RE | Admit: 2019-11-06 | Discharge: 2019-11-06 | Disposition: A | Discharge status: Home / Self Care | Payer: Medicare Other | Source: Ambulatory Visit | Attending: Urology | Admitting: Urology

## 2019-11-06 ENCOUNTER — Other Ambulatory Visit: Payer: Self-pay

## 2019-11-06 DIAGNOSIS — R9721 Rising PSA following treatment for malignant neoplasm of prostate: Secondary | ICD-10-CM | POA: Diagnosis not present

## 2019-11-06 MED ORDER — AXUMIN (FLUCICLOVINE F 18) INJECTION
10.5700 | Freq: Once | INTRAVENOUS | Status: AC
  Administered 2019-11-06: 10.57 via INTRAVENOUS

## 2019-11-10 ENCOUNTER — Other Ambulatory Visit: Payer: Self-pay | Admitting: Cardiovascular Disease

## 2020-01-06 ENCOUNTER — Encounter (HOSPITAL_COMMUNITY): Payer: Self-pay | Admitting: Emergency Medicine

## 2020-01-06 ENCOUNTER — Other Ambulatory Visit: Payer: Self-pay

## 2020-01-06 ENCOUNTER — Emergency Department (HOSPITAL_COMMUNITY): Payer: Medicare Other

## 2020-01-06 ENCOUNTER — Telehealth: Payer: Self-pay

## 2020-01-06 ENCOUNTER — Observation Stay (HOSPITAL_COMMUNITY)
Admission: EM | Admit: 2020-01-06 | Discharge: 2020-01-07 | Disposition: A | Discharge status: Home / Self Care | Payer: Medicare Other | Attending: Cardiovascular Disease | Admitting: Cardiovascular Disease

## 2020-01-06 DIAGNOSIS — E119 Type 2 diabetes mellitus without complications: Secondary | ICD-10-CM

## 2020-01-06 DIAGNOSIS — I714 Abdominal aortic aneurysm, without rupture, unspecified: Secondary | ICD-10-CM | POA: Diagnosis present

## 2020-01-06 DIAGNOSIS — Z7902 Long term (current) use of antithrombotics/antiplatelets: Secondary | ICD-10-CM | POA: Diagnosis not present

## 2020-01-06 DIAGNOSIS — E785 Hyperlipidemia, unspecified: Secondary | ICD-10-CM | POA: Diagnosis not present

## 2020-01-06 DIAGNOSIS — Z79899 Other long term (current) drug therapy: Secondary | ICD-10-CM | POA: Diagnosis not present

## 2020-01-06 DIAGNOSIS — I2510 Atherosclerotic heart disease of native coronary artery without angina pectoris: Secondary | ICD-10-CM | POA: Diagnosis present

## 2020-01-06 DIAGNOSIS — I10 Essential (primary) hypertension: Secondary | ICD-10-CM | POA: Diagnosis present

## 2020-01-06 DIAGNOSIS — K219 Gastro-esophageal reflux disease without esophagitis: Secondary | ICD-10-CM | POA: Insufficient documentation

## 2020-01-06 DIAGNOSIS — I252 Old myocardial infarction: Secondary | ICD-10-CM | POA: Diagnosis not present

## 2020-01-06 DIAGNOSIS — I2582 Chronic total occlusion of coronary artery: Secondary | ICD-10-CM | POA: Insufficient documentation

## 2020-01-06 DIAGNOSIS — Z7982 Long term (current) use of aspirin: Secondary | ICD-10-CM | POA: Insufficient documentation

## 2020-01-06 DIAGNOSIS — I25110 Atherosclerotic heart disease of native coronary artery with unstable angina pectoris: Principal | ICD-10-CM | POA: Insufficient documentation

## 2020-01-06 DIAGNOSIS — I509 Heart failure, unspecified: Secondary | ICD-10-CM | POA: Diagnosis not present

## 2020-01-06 DIAGNOSIS — C61 Malignant neoplasm of prostate: Secondary | ICD-10-CM | POA: Insufficient documentation

## 2020-01-06 DIAGNOSIS — Z79890 Hormone replacement therapy (postmenopausal): Secondary | ICD-10-CM | POA: Insufficient documentation

## 2020-01-06 DIAGNOSIS — Z955 Presence of coronary angioplasty implant and graft: Secondary | ICD-10-CM | POA: Diagnosis not present

## 2020-01-06 DIAGNOSIS — M1711 Unilateral primary osteoarthritis, right knee: Secondary | ICD-10-CM | POA: Insufficient documentation

## 2020-01-06 DIAGNOSIS — Z87891 Personal history of nicotine dependence: Secondary | ICD-10-CM | POA: Diagnosis not present

## 2020-01-06 DIAGNOSIS — I200 Unstable angina: Secondary | ICD-10-CM | POA: Diagnosis present

## 2020-01-06 DIAGNOSIS — R079 Chest pain, unspecified: Secondary | ICD-10-CM

## 2020-01-06 DIAGNOSIS — Z20822 Contact with and (suspected) exposure to covid-19: Secondary | ICD-10-CM | POA: Insufficient documentation

## 2020-01-06 DIAGNOSIS — Z951 Presence of aortocoronary bypass graft: Secondary | ICD-10-CM | POA: Insufficient documentation

## 2020-01-06 DIAGNOSIS — I110 Hypertensive heart disease with heart failure: Secondary | ICD-10-CM | POA: Insufficient documentation

## 2020-01-06 DIAGNOSIS — E039 Hypothyroidism, unspecified: Secondary | ICD-10-CM | POA: Diagnosis not present

## 2020-01-06 DIAGNOSIS — Z7984 Long term (current) use of oral hypoglycemic drugs: Secondary | ICD-10-CM | POA: Insufficient documentation

## 2020-01-06 LAB — BASIC METABOLIC PANEL
Anion gap: 12 (ref 5–15)
BUN: 16 mg/dL (ref 8–23)
CO2: 25 mmol/L (ref 22–32)
Calcium: 9.4 mg/dL (ref 8.9–10.3)
Chloride: 100 mmol/L (ref 98–111)
Creatinine, Ser: 0.95 mg/dL (ref 0.61–1.24)
GFR calc Af Amer: >60 mL/min (ref >60)
GFR calc non Af Amer: >60 mL/min (ref >60)
Glucose, Bld: 172 mg/dL — ABNORMAL HIGH (ref 70–99)
Potassium: 3.8 mmol/L (ref 3.5–5.1)
Sodium: 137 mmol/L (ref 135–145)

## 2020-01-06 LAB — HEPATIC FUNCTION PANEL
ALT: 13 U/L (ref 0–44)
AST: 31 U/L (ref 15–41)
Albumin: 3.4 g/dL — ABNORMAL LOW (ref 3.5–5.0)
Alkaline Phosphatase: 101 U/L (ref 38–126)
Bilirubin, Direct: 0.5 mg/dL — ABNORMAL HIGH (ref 0.0–0.2)
Indirect Bilirubin: 0.9 mg/dL (ref 0.3–0.9)
Total Bilirubin: 1.4 mg/dL — ABNORMAL HIGH (ref 0.3–1.2)
Total Protein: 6.3 g/dL — ABNORMAL LOW (ref 6.5–8.1)

## 2020-01-06 LAB — CBC
HCT: 40.4 % (ref 39.0–52.0)
Hemoglobin: 12.7 g/dL — ABNORMAL LOW (ref 13.0–17.0)
MCH: 25.3 pg — ABNORMAL LOW (ref 26.0–34.0)
MCHC: 31.4 g/dL (ref 30.0–36.0)
MCV: 80.6 fL (ref 80.0–100.0)
Platelets: 190 10*3/uL (ref 150–400)
RBC: 5.01 MIL/uL (ref 4.22–5.81)
RDW: 18.1 % — ABNORMAL HIGH (ref 11.5–15.5)
WBC: 4.6 10*3/uL (ref 4.0–10.5)
nRBC: 0 % (ref 0.0–0.2)

## 2020-01-06 LAB — TROPONIN I (HIGH SENSITIVITY)
Troponin I (High Sensitivity): 8 ng/L (ref <18)
Troponin I (High Sensitivity): 8 ng/L (ref <18)
Troponin I (High Sensitivity): 9 ng/L (ref <18)

## 2020-01-06 LAB — GLUCOSE, CAPILLARY: Glucose-Capillary: 168 mg/dL — ABNORMAL HIGH (ref 70–99)

## 2020-01-06 LAB — LIPASE, BLOOD: Lipase: 24 U/L (ref 11–51)

## 2020-01-06 LAB — CBG MONITORING, ED: Glucose-Capillary: 207 mg/dL — ABNORMAL HIGH (ref 70–99)

## 2020-01-06 LAB — SARS CORONAVIRUS 2 BY RT PCR (HOSPITAL ORDER, PERFORMED IN ~~LOC~~ HOSPITAL LAB): SARS Coronavirus 2: NEGATIVE

## 2020-01-06 MED ORDER — ONDANSETRON HCL 4 MG/2ML IJ SOLN: 4.0000 mg | Freq: Four times a day (QID) | INTRAMUSCULAR | Status: DC | PRN

## 2020-01-06 MED ORDER — HEPARIN SODIUM (PORCINE) 5000 UNIT/ML IJ SOLN
5000.0000 [IU] | Freq: Three times a day (TID) | INTRAMUSCULAR | Status: DC
  Administered 2020-01-06 – 2020-01-07 (×2): 5000 [IU] via SUBCUTANEOUS
  Filled 2020-01-06 (×2): qty 1

## 2020-01-06 MED ORDER — SODIUM CHLORIDE 0.9 % WEIGHT BASED INFUSION
3.0000 mL/kg/h | INTRAVENOUS | Status: AC
  Administered 2020-01-07: 3 mL/kg/h via INTRAVENOUS

## 2020-01-06 MED ORDER — ASPIRIN 81 MG PO CHEW
81.0000 mg | CHEWABLE_TABLET | ORAL | Status: AC
  Administered 2020-01-07: 81 mg via ORAL
  Filled 2020-01-06: qty 1

## 2020-01-06 MED ORDER — LISINOPRIL 10 MG PO TABS
10.0000 mg | ORAL_TABLET | Freq: Every day | ORAL | Status: DC
  Administered 2020-01-06 – 2020-01-07 (×2): 10 mg via ORAL
  Filled 2020-01-06 (×2): qty 1

## 2020-01-06 MED ORDER — CLOPIDOGREL BISULFATE 75 MG PO TABS
75.0000 mg | ORAL_TABLET | Freq: Every day | ORAL | Status: DC
  Administered 2020-01-06 – 2020-01-07 (×2): 75 mg via ORAL
  Filled 2020-01-06 (×2): qty 1

## 2020-01-06 MED ORDER — ROSUVASTATIN CALCIUM 20 MG PO TABS
20.0000 mg | ORAL_TABLET | Freq: Every day | ORAL | Status: DC
  Administered 2020-01-06 – 2020-01-07 (×2): 20 mg via ORAL
  Filled 2020-01-06 (×2): qty 1

## 2020-01-06 MED ORDER — METOPROLOL TARTRATE 25 MG PO TABS
25.0000 mg | ORAL_TABLET | Freq: Two times a day (BID) | ORAL | Status: DC
  Administered 2020-01-06 – 2020-01-07 (×2): 25 mg via ORAL
  Filled 2020-01-06 (×3): qty 1

## 2020-01-06 MED ORDER — NITROGLYCERIN IN D5W 200-5 MCG/ML-% IV SOLN
0.0000 ug/min | INTRAVENOUS | Status: DC
  Administered 2020-01-07: 20 ug/min via INTRAVENOUS
  Filled 2020-01-06: qty 250

## 2020-01-06 MED ORDER — ASPIRIN EC 81 MG PO TBEC: 81.0000 mg | DELAYED_RELEASE_TABLET | Freq: Every day | ORAL | Status: DC

## 2020-01-06 MED ORDER — TAMSULOSIN HCL 0.4 MG PO CAPS
0.4000 mg | ORAL_CAPSULE | Freq: Every day | ORAL | Status: DC
  Administered 2020-01-06 – 2020-01-07 (×2): 0.4 mg via ORAL
  Filled 2020-01-06 (×2): qty 1

## 2020-01-06 MED ORDER — SODIUM CHLORIDE 0.9% FLUSH: 3.0000 mL | Freq: Once | INTRAVENOUS | Status: DC

## 2020-01-06 MED ORDER — ASPIRIN 81 MG PO CHEW
324.0000 mg | CHEWABLE_TABLET | Freq: Once | ORAL | Status: AC
  Administered 2020-01-06: 324 mg via ORAL
  Filled 2020-01-06: qty 4

## 2020-01-06 MED ORDER — LEVOTHYROXINE SODIUM 100 MCG PO TABS
100.0000 ug | ORAL_TABLET | Freq: Every day | ORAL | Status: DC
  Administered 2020-01-07: 100 ug via ORAL
  Filled 2020-01-06: qty 1

## 2020-01-06 MED ORDER — SODIUM CHLORIDE 0.9% FLUSH
3.0000 mL | Freq: Two times a day (BID) | INTRAVENOUS | Status: DC
  Administered 2020-01-06 – 2020-01-07 (×2): 3 mL via INTRAVENOUS

## 2020-01-06 MED ORDER — SODIUM CHLORIDE 0.9 % WEIGHT BASED INFUSION
1.0000 mL/kg/h | INTRAVENOUS | Status: DC
  Administered 2020-01-07 (×2): 1 mL/kg/h via INTRAVENOUS

## 2020-01-06 MED ORDER — ASPIRIN 81 MG PO TABS: 81.0000 mg | ORAL_TABLET | Freq: Every day | ORAL | Status: DC

## 2020-01-06 MED ORDER — SODIUM CHLORIDE 0.9 % IV SOLN: 250.0000 mL | INTRAVENOUS | Status: DC | PRN

## 2020-01-06 MED ORDER — POTASSIUM CHLORIDE CRYS ER 10 MEQ PO TBCR
10.0000 meq | EXTENDED_RELEASE_TABLET | Freq: Every day | ORAL | Status: DC
  Administered 2020-01-06 – 2020-01-07 (×2): 10 meq via ORAL
  Filled 2020-01-06 (×3): qty 1

## 2020-01-06 MED ORDER — NITROGLYCERIN IN D5W 200-5 MCG/ML-% IV SOLN
0.0000 ug/min | INTRAVENOUS | Status: DC
  Administered 2020-01-06: 5 ug/min via INTRAVENOUS
  Filled 2020-01-06: qty 250

## 2020-01-06 MED ORDER — HYDROCHLOROTHIAZIDE 25 MG PO TABS
25.0000 mg | ORAL_TABLET | Freq: Every day | ORAL | Status: DC
  Administered 2020-01-06 – 2020-01-07 (×2): 25 mg via ORAL
  Filled 2020-01-06 (×2): qty 1

## 2020-01-06 MED ORDER — ACETAMINOPHEN 325 MG PO TABS
650.0000 mg | ORAL_TABLET | ORAL | Status: DC | PRN
  Administered 2020-01-07: 650 mg via ORAL
  Filled 2020-01-06: qty 2

## 2020-01-06 MED ORDER — PANTOPRAZOLE SODIUM 40 MG PO TBEC
40.0000 mg | DELAYED_RELEASE_TABLET | Freq: Two times a day (BID) | ORAL | Status: DC
  Administered 2020-01-06 – 2020-01-07 (×3): 40 mg via ORAL
  Filled 2020-01-06 (×3): qty 1

## 2020-01-06 MED ORDER — SODIUM CHLORIDE 0.9% FLUSH: 3.0000 mL | INTRAVENOUS | Status: DC | PRN

## 2020-01-06 MED ORDER — INSULIN ASPART 100 UNIT/ML ~~LOC~~ SOLN
0.0000 [IU] | Freq: Three times a day (TID) | SUBCUTANEOUS | Status: DC
  Administered 2020-01-06: 5 [IU] via SUBCUTANEOUS

## 2020-01-06 NOTE — ED Provider Notes (Signed)
Received patient at signout from [facility].  Refer to provider note for full history and physical examination.  Briefly patient is an 85 year old male with extensive cardiac history presenting for evaluation of chest pains.  Also found to have right upper quadrant abdominal tenderness on examination.  He is awaiting right upper quadrant ultrasound.  Cardiology is planning to assess the patient emergently in the ED and will make further recommendations.    ED Course/Procedures   Clinical Course as of Jan 05 1506  Tue Jan 05, 7265  [id number] 85 year old male with complaint of left side chest pain, initial episode of 2 days ago, return of pain at midnight last night with persistence.  Patient was given aspirin and nitro in the ER with slight improvement in his pain.  On exam was found to have tenderness in the right upper quadrant, hepatic function and ultrasound added to work-up.  EKG without acute ischemic changes, troponins flat had 8 and 8, BMP without significant findings, hepatic function with protein 6.3, albumin 3.4 CBC without significant findings, lipase within limits.  Right upper quadrant ultrasound is pending.  Patient was seen by Dr. Kathrynn Humble, ER attending, recommends consult with cardiology.  Case discussed with cards master, cardiology to consult on patient.   [LM]    Clinical Course User Index [LM] Roque Lias    Procedures  MDM  DG Chest 2 View  Result Date: 01/06/2020 CLINICAL DATA:  Chest pain EXAM: CHEST - 2 VIEW COMPARISON:  CT angiogram chest June 13, 2018 and chest radiograph Dec 05, 2015 FINDINGS: Lungs are clear. Heart size and pulmonary vascularity are normal. Patient is status post coronary artery bypass grafting. No adenopathy. There is focal eventration along the right hemidiaphragm, stable. IMPRESSION: No edema or airspace opacity. Stable cardiac silhouette. Stable right hemidiaphragm focal eventration. Electronically Signed   By: Lowella Grip III M.D.    On: 01/06/2020 09:56   US Abdomen Limited  Result Date: 01/06/2020 CLINICAL DATA:  Right upper quadrant pain EXAM: ULTRASOUND ABDOMEN LIMITED RIGHT UPPER QUADRANT COMPARISON:  06/06/2018 FINDINGS: Gallbladder: No gallstones or wall thickening visualized. No sonographic Murphy sign noted by sonographer. Common bile duct: Diameter: 3 mm Liver: No focal lesion identified. Within normal limits in parenchymal echogenicity. Portal vein is patent on color Doppler imaging with normal direction of blood flow towards the liver. Other: Incidental 2.6 cm right renal cyst unchanged. IMPRESSION: 1. Unremarkable right upper quadrant ultrasound. Electronically Signed   By: Randa Ngo M.D.   On: 01/06/2020 15:40   Imaging of the right upper quadrant unremarkable, no evidence of acute surgical abdominal pathology.  Cardiology is planning on admission to the hospital for further evaluation and management of his chest pain.       Renita Papa, PA-C 01/06/20 1729    Carmin Muskrat, MD 01/08/20 2027

## 2020-01-06 NOTE — H&P (Addendum)
Cardiology History and Physical:   Patient ID: Benjamin Harrison MRN: [id number]; DOB: Jun 16, 1935  Admit date: 01/06/2020 Date of Consult: 01/06/2020  Primary Care Provider: Leonard Downing, MD [facility] Cardiologist: Mertie Moores, MD  [place] Electrophysiologist:  None    Patient Profile:   Benjamin Harrison is a 85 y.o. male with a hx of CABG x 4 (2001) with 3/4 patent grafts (2019) and hx of in-stent restenosis of the RCA, HTN, HLD, DM, and prostate cancer who is being seen today for the evaluation of chest pain at the request of Dr. Kathrynn Humble.  History of Present Illness:   Benjamin Harrison has a complex CAD history with CABG x 4 in 2001 followed by PTCA and stenting of RCA in 2012 and 2013. Left heart cath 2017 with occluded SVG-RCA, patent SVG-diagonal and SVG-OM, patent LIMA-LAD, severe restenosis in the mid-distal RCA stents treated with cutting balloon. Of note, he had negative CE at that time. Left heart cath 09/2017 with continued patency of 3/4 grafts, cutting balloon to 80% in-stent stenosis in the RCA. Echo at that time with EF 60-65%, mild LVH, mild AR, and mild MR. He presented to [facility] 01/13/18 with left-sided chest pain in the setting of intractable N/V, negative troponins x 3. No ischemic workup at that time.  He is followed by Dr. Scot Dock for 3.6 cm infrarenal abdominal aortic aneurysm, observed annually. He last saw Dr. Acie Fredrickson 10/02/19 and was doing well at that time. He reported palpitations and continued to battle prostate cancer.   He presented to [facility] 01/06/20 after presenting to our outpatient clinic this morning with chest pain. He was told to present to the ER.   On my interview, pt had just returned from abdominal ultrasound, obtained due to abdominal tenderness on palpation. He describes chest pain similar to his prior heart attacks in his mid substernal area that radiated to his left shoulder. Chest pain started early Sunday morning, rated as a 8/10, lasted 2-3  hours, and was associated with nausea, SOB, and diaphoresis. He did not call our office, seek care, or take nitro. He was symptom-free the rest of Sunday and Monday. Last evening, he had another bout of chest pain while lying in bed. CP radiated to left should and between shoulder blades. He also had numbness in his left fingers, SOB, and diaphoresis. On arrival, he was started on nitro drip and his pain is now 1/10. He describes this CP as similar to his prior heart attacks. No recent illness. COVID vaccination in February. He does report episodes of dizziness that sound orthostatic at times. He has not fallen in the last 30 days.  HS troponin x 2 negative.  EKG sinus rhythm with HR 79 with old inferior infarct and poor R wave progression - seen on prior tracings.   Past Medical History:  Diagnosis Date   AAA (abdominal aortic aneurysm) (HCC)    CHF (congestive heart failure) (HCC)    Coronary artery disease    a. CABG 2001 with early failure of VG-RCA. b. Inf MI after Lexiscan 01/28/2009 s/p RCA stent. c. Stent to dRCA 02/24/09. d. DES to [place]  2012. e. NSTEMI s/p angioplasty for ISR of RCA 09/2011. f. 05/2012 Cath: LM 20-30p, LAD 100p, LCX min irregs, RCA patent prox stent, 35m ISR, VG->Diag nl w/ dzs in D1 prox to graft, VG->RCA 100, VG->OM nl, LIMA->LAD nl, Nl EF->Med Rx; g. 09/2014 low-risk MV.   Dyspnea    GERD (gastroesophageal reflux  disease)    History of echocardiogram    a. 09/2014 Echo: EF 55-60%, mild basal-midinferior HK, triv AI.   Hyperlipidemia    Hypertension    Hypertensive heart disease    Hypothyroidism    Myocardial infarction [facility])    Osteoarthritis    a.End-stage osteoarthritis, right knee, medial compartment   Personal history of tobacco use    Prostate cancer ([place])    Skin cancer    melanoma   Type II diabetes mellitus ([place])     Past Surgical History:  Procedure Laterality Date   CARDIAC CATHETERIZATION     CARDIAC CATHETERIZATION N/A  12/06/2015   Procedure: Left Heart Cath and Cors/Grafts Angiography;  Surgeon: Burnell Blanks, MD;  Location: [place] CV LAB;  Service: Cardiovascular;  Laterality: N/A;   CARDIAC CATHETERIZATION N/A 12/06/2015   Procedure: Coronary Balloon Angioplasty;  Surgeon: Burnell Blanks, MD;  Location: [place] CV LAB;  Service: Cardiovascular;  Laterality: N/A;   CATARACT EXTRACTION     CORONARY ARTERY BYPASS GRAFT     EYE SURGERY     HERNIA REPAIR  1985   INTRAVASCULAR ULTRASOUND/IVUS N/A 10/15/2017   Procedure: Intravascular Ultrasound/IVUS;  Surgeon: Belva Crome, MD;  Location: [place] CV LAB;  Service: Cardiovascular;  Laterality: N/A;   KNEE ARTHROSCOPY  1999 and 2004   LEFT HEART CATH AND CORS/GRAFTS ANGIOGRAPHY N/A 10/15/2017   Procedure: LEFT HEART CATH AND CORS/GRAFTS ANGIOGRAPHY;  Surgeon: Belva Crome, MD;  Location: [place] CV LAB;  Service: Cardiovascular;  Laterality: N/A;   LEFT HEART CATHETERIZATION WITH CORONARY ANGIOGRAM N/A 10/09/2011   Procedure: LEFT HEART CATHETERIZATION WITH CORONARY ANGIOGRAM;  Surgeon: Peter M Martinique, MD;  Location: [facility] CATH LAB;  Service: Cardiovascular;  Laterality: N/A;   LEFT HEART CATHETERIZATION WITH CORONARY/GRAFT ANGIOGRAM N/A 06/06/2012   Procedure: LEFT HEART CATHETERIZATION WITH Beatrix Fetters;  Surgeon: Peter M Martinique, MD;  Location: [facility] CATH LAB;  Service: Cardiovascular;  Laterality: N/A;     Home Medications:  Prior to Admission medications   Medication Sig Start Date End Date Taking? Authorizing Provider  acetaminophen (TYLENOL) 500 MG tablet Take 1,000 mg by mouth as needed for mild pain.    [provider]  aspirin 81 MG tablet Take 1 tablet (81 mg total) by mouth daily. 10/10/11   [name], NP  clopidogrel (PLAVIX) 75 MG tablet Take 1 tablet (75 mg total) by mouth daily. 08/26/19   [name], [name], MD  Ferrous Sulfate (IRON PO) Take 1 tablet by mouth daily.    [provider]  fluocinonide (LIDEX) 0.05 % external solution Apply 1 mL topically 2 (two) times daily as needed (dryness and itching).  12/24/17   [provider]  hydrochlorothiazide (HYDRODIURIL) 25 MG tablet Take 1 tablet (25 mg total) by mouth daily. 12/19/13   [name], [name], MD  isosorbide mononitrate (IMDUR) 120 MG 24 hr tablet TAKE 1 TABLET BY MOUTH  DAILY 11/11/19   [name], [name], MD  levothyroxine (SYNTHROID, LEVOTHROID) 100 MCG tablet Take 100 mcg by mouth daily before breakfast.     [provider]  lisinopril (ZESTRIL) 10 MG tablet TAKE 1 TABLET BY MOUTH  DAILY 06/24/19   [name], [name], MD  metFORMIN (GLUCOPHAGE) 500 MG tablet Take 500 mg by mouth 2 (two) times daily with a meal. Takes 2 tablets in the morning and 1 tablet @@ night    [provider]  metoprolol tartrate (LOPRESSOR) 25 MG tablet TAKE  1 TABLET BY MOUTH TWO  TIMES DAILY 08/25/19   [name], [name], MD  ondansetron (ZOFRAN ODT) 4 MG disintegrating tablet Take 1 tablet (4 mg total) by mouth every 8 (eight) hours as needed. 02/07/18   [name], MD  pantoprazole (PROTONIX) 40 MG tablet TAKE 1 TABLET BY MOUTH  TWICE DAILY 09/08/19   [name], [name], MD  Polyethyl Glycol-Propyl Glycol (SYSTANE FREE OP) Place 1 drop into both eyes 3 (three) times daily as needed (for dry eyes.).     [provider]  potassium chloride (KLOR-CON) 10 MEQ tablet TAKE 1 TABLET BY MOUTH AT  NIGHT 11/11/19   [name], [name], MD  rosuvastatin (CRESTOR) 20 MG tablet Take 1 tablet (20 mg total) by mouth daily. 04/04/19 03/29/20  [name], [name], MD  tamsulosin (FLOMAX) 0.4 MG CAPS capsule Take 0.4 mg by mouth in the morning and at bedtime.    [provider]    Inpatient Medications: Scheduled Meds:  sodium chloride flush  3 mL Intravenous Once   Continuous Infusions:  nitroGLYCERIN 30 mcg/min (01/06/20 1211)   PRN Meds:   Allergies:    Allergies  Allergen Reactions   Codeine Nausea Only     Pt reports this happens off and on    Social History:   Social History   Socioeconomic History   Marital status: Married    Spouse name: Not on file   Number of children: 2   Years of education: Not on file   Highest education level: Not on file  Occupational History   Not on file  Tobacco Use   Smoking status: Former Smoker    Packs/day: 1.00    Years: 20.00    Pack years: 20.00    Types: Cigarettes    Quit date: 08/01/1975    Years since quitting: 44.4   Smokeless tobacco: Never Used  Substance and Sexual Activity   Alcohol use: No   Drug use: No   Sexual activity: Not Currently  Other Topics Concern   Not on file  Social History Narrative   Lives locally with wife.   Social Determinants of Health   Financial Resource Strain:    Difficulty of Paying Living Expenses:   Food Insecurity:    Worried About Charity fundraiser in the Last Year:    Arboriculturist in the Last Year:   Transportation Needs:    Film/video editor (Medical):    Lack of Transportation (Non-Medical):   Physical Activity:    Days of Exercise per Week:    Minutes of Exercise per Session:   Stress:    Feeling of Stress :   Social Connections:    Frequency of Communication with Friends and Family:    Frequency of Social Gatherings with Friends and Family:    Attends Religious Services:    Active Member of Clubs or Organizations:    Attends Music therapist:    Marital Status:   Intimate Partner Violence:    Fear of Current or Ex-Partner:    Emotionally Abused:    Physically Abused:    Sexually Abused:     Family History:    Family History  Problem Relation Age of Onset   Hypertension Mother    Unexplained death Mother    Throat cancer Sister    Lung cancer Sister    Heart disease Brother    Pneumonia Brother    Heart disease Brother    Prostate cancer Brother  Unexplained death Brother        at birth   Unexplained  death Brother    Prostate cancer Son    Lung cancer Son    Prostate cancer Other      ROS:  Please see the history of present illness.   All other ROS reviewed and negative.     Physical Exam/Data:   Vitals:   01/06/20 1345 01/06/20 1400 01/06/20 1415 01/06/20 1430  BP: (!) 174/110 (!) 168/114 (!) 172/98 (!) 168/111  Pulse: 74 72 77 83  Resp: 13 16 20 13   Temp:      TempSrc:      SpO2: 98% 98% 96% 95%  Height:       No intake or output data in the 24 hours ending 01/06/20 1540 Last 3 Weights 10/10/2019 10/02/2019 07/11/2019  Weight (lbs) 165 lb 12.8 oz 166 lb 8 oz 168 lb  Weight (kg) 75.206 kg 75.524 kg 76.204 kg     Body mass index is 27.59 kg/m.  General:  Elderly male, NAD HEENT: normal Neck: no JVD Vascular: No carotid bruits  Cardiac:  normal S1, S2; RRR; no murmur, tender to palpation in his lower sternum Lungs:  clear to auscultation bilaterally, no wheezing, rhonchi or rales  Abd: soft, + BS, tender to palpation across abdomen   Ext: no edema, legs are cold Musculoskeletal:  No deformities, BUE and BLE strength normal and equal Skin: warm and dry  Neuro:  CNs 2-12 intact, no focal abnormalities noted Psych:  Normal affect   EKG:  The EKG was personally reviewed and demonstrates:  sinus rhythm with HR 79 with old inferior infarct and poor R wave progression - seen on prior tracings Telemetry:  Telemetry was personally reviewed and demonstrates:  Sinus rhythm HR 70 with PACs  Relevant CV Studies:  Echo 01/13/18: Study Conclusions   - Left ventricle: The cavity size was normal. Wall thickness was  increased in a pattern of mild LVH. Systolic function was normal.  The estimated ejection fraction was in the range of 60% to 65%.  Wall motion was normal; there were no regional wall motion  abnormalities. Left ventricular diastolic function parameters  were normal.  - Aortic valve: There was mild stenosis. There was mild  regurgitation. Valve area  (VTI): 1.86 cm^2. Valve area (Vmax):  1.81 cm^2. Valve area (Vmean): 1.84 cm^2.  - Mitral valve: There was mild regurgitation.  - Right ventricle: The cavity size was moderately dilated. Wall  thickness was normal.  - Right atrium: The atrium was moderately dilated.  - Atrial septum: No defect or patent foramen ovale was identified.    Left heart cath 10/15/17:  Occluded saphenous vein graft to the right coronary.  Patent saphenous vein graft to the diagonal and to the obtuse marginal.  Patent LIMA to LAD.  Total occlusion of proximal to mid LAD.  Heavily stented RCA from proximal to distal vessel with multifocal high-grade in-stent restenosis.  Total occlusion of the circumflex beyond the first obtuse marginal.  PCI performed on the right coronary including [place] cutting balloon without any impact on the high-grade distal right coronary but with significant improvement in the 80% proximal stenosis within the stented segment.  Low normal left ventricular systolic function with EF 50-55%.  Mild inferior wall hypokinesis.  RECOMMENDATIONS:   Severe in-stent restenosis within the heavily stented native right coronary with non-dilatable lesions using noncompliant relatively oversize balloons and also scoring balloons.  IV nitroglycerin at 10 mcg/min as  the patient is having mild residual discomfort after multiple attempts at right coronary PCI.  Suspect microembolization or atheroemboli as a source.  EKG postprocedure was nonischemic.  Sheath will be pulled later today.  Home in a.m. with only prudent therapy for right coronary disease being medical therapy.  Should not be reoperated upon because of the small territory and wide patency of the remaining bypass grafts.  Laboratory Data:  High Sensitivity Troponin:   Recent Labs  Lab 01/06/20 0920 01/06/20 1117  TROPONINIHS 8 8     Chemistry Recent Labs  Lab 01/06/20 0920  NA 137  K 3.8  CL 100  CO2 25    GLUCOSE 172*  BUN 16  CREATININE 0.95  CALCIUM 9.4  GFRNONAA >60  GFRAA >60  ANIONGAP 12    Recent Labs  Lab 01/06/20 1122  PROT 6.3*  ALBUMIN 3.4*  AST 31  ALT 13  ALKPHOS 101  BILITOT 1.4*   Hematology Recent Labs  Lab 01/06/20 0920  WBC 4.6  RBC 5.01  HGB 12.7*  HCT 40.4  MCV 80.6  MCH 25.3*  MCHC 31.4  RDW 18.1*  PLT 190   BNPNo results for input(s): BNP, PROBNP in the last 168 hours.  DDimer No results for input(s): DDIMER in the last 168 hours.   Radiology/Studies:  DG Chest 2 View  Result Date: 01/06/2020 CLINICAL DATA:  Chest pain EXAM: CHEST - 2 VIEW COMPARISON:  CT angiogram chest June 13, 2018 and chest radiograph Dec 05, 2015 FINDINGS: Lungs are clear. Heart size and pulmonary vascularity are normal. Patient is status post coronary artery bypass grafting. No adenopathy. There is focal eventration along the right hemidiaphragm, stable. IMPRESSION: No edema or airspace opacity. Stable cardiac silhouette. Stable right hemidiaphragm focal eventration. Electronically Signed   By: Lowella Grip III M.D.   On: 01/06/2020 09:56       HEAR Score (for undifferentiated chest pain):  HEAR Score: 7     Assessment and Plan:   Chest pain CAD s/p CABG (2001) - hx of in-stent restenosis in the RCA, last intervention was 2019 - hs troponin x 2 negative - EKG without new ischemic changes, stable from prior tracings - pt describes chest pain concerning for unstable angina, although he is also tender to palpation in his abdomen and lower sternum - consider definitive angiography given his history of RCA stenosis  - continue ASA and plavix, BB  - if recurrent pain, start heparin - otherwise hold off with negative enzymes   Hypertension - maintained on HCTZ, imdur, lisinopril, and BB - restart home medications for elevated pressure despite nitro drip   Hyperlipidemia 07/03/2019: Cholesterol, Total 81; HDL 27; LDL Chol Calc (NIH) 33; Triglycerides 110 - at  goal - continue statin - crestor 20 mg   DM - per primary - A1c 6.7%   Former smoker   Abdominal tenderness - unremarkable RUQ [place]   Plan to admit to cardiology service and obtain definitive angiography.     For questions or updates, please contact [place] Please consult [email or web address] for contact info under    Signed, Ledora Bottcher, PA  01/06/2020 3:40 PM   Patient seen, examined. Available data reviewed. Agree with findings, assessment, and plan as outlined by Doreene Adas, PA-C.  The patient is independently interviewed and examined.  His wife is at the bedside.  He is currently chest pain-free at rest.  HEENT is normal, carotid upstrokes are normal without bruits, JVP is normal, lung fields  are clear, heart is regular rate and rhythm with a 2/6 systolic murmur heard throughout, loudest at the right upper sternal border.  Abdomen is soft and nontender with no organomegaly.  Extremities have no edema.  Skin is warm and dry with no rash.  EKG is reviewed and shows sinus rhythm with an age-indeterminate inferior infarct pattern and no acute ST/T wave changes.  Troponins are negative.  The patient has typical symptoms of crescendo angina.  He has very high pretest probability of recurrent obstructive disease.  I reviewed his cardiac catheterization films and interventional films from 2019 when he was left with severe residual stenosis in the RCA as the lesion was nondilatable.  Reviewed potential options of further medical therapy versus definitive evaluation with cardiac catheterization and possible PCI.  I think stress testing would be of low utility in this patient with prior CABG, extensive stenting, and multiple past PCI procedures with known residual severe stenosis in the mid RCA.  The patient is agreeable to cardiac catheterization and possible PCI.  He is continued on aspirin and clopidogrel without interruption.  Will hold off on IV heparin unless he has recurrent chest  pain at rest. I have reviewed the risks, indications, and alternatives to cardiac catheterization, possible angioplasty, and stenting with the patient. Risks include but are not limited to bleeding, infection, vascular injury, stroke, myocardial infection, arrhythmia, kidney injury, radiation-related injury in the case of prolonged fluoroscopy use, emergency cardiac surgery, and death. The patient understands the risks of serious complication is 1-2 in [id number] with diagnostic cardiac cath and 1-2% or less with angioplasty/stenting.   Sherren Mocha, M.D. 01/06/2020 4:40 PM

## 2020-01-06 NOTE — ED Notes (Signed)
Pt transported to US

## 2020-01-06 NOTE — Telephone Encounter (Signed)
The patient walked in with his wife complaining of CP and SOB.  He said the pain started on Sunday and he had been taking NTG, with minimal relief.  He said the pain is radiating to left shoulder and arm.  He has hx of CAD.    He did not bring NTG with him and I advised that they go to the ED for further evaluation.  He said that his BP this morning was 181/106 HR 60.

## 2020-01-06 NOTE — ED Provider Notes (Signed)
[facility] Provider Note   CSN: [id number] Arrival date & time: 01/06/20  9518     History Chief Complaint  Patient presents with  . Chest Pain    [facility] is a 85 y.o. male.  85 year old male presents with complaint of chest pain.  Patient states he was resting at home on Sunday when he had sudden onset of left-sided chest pressure lasting 2 to 3 hours, associated with nausea, diaphoresis, shortness of breath.  Patient rested at home that day, feeling generally weak, thought that he had a mild heart attack but did not go to the emergency room, did not take his nitro at home.  Patient states that he was lying in bed last night a week around midnight when he developed left-sided chest pressure which radiates towards his left shoulder as well as pain between his shoulder blades and numbness in his left fingers.  Symptoms also associated with shortness of breath and diaphoresis, not feeling nauseous.  Pain has been constant since that time, is worse laying on his left side.  Patient did not take nitro at home, has not taken his aspirin or any other of his home medications today.  Patient has history of prior CABG, stent, is followed by cardiology.  No other complaints or concerns at this time.     HPI: A 85 year old patient with a history of hypertension and hypercholesterolemia presents for evaluation of chest pain. Initial onset of pain was more than 6 hours ago. The patient's chest pain is well-localized, is described as heaviness/pressure/tightness, is not worse with exertion and is relieved by nitroglycerin. The patient complains of nausea and reports some diaphoresis. The patient's chest pain is not middle- or left-sided, is not sharp and does radiate to the arms/jaw/neck. The patient has no history of stroke, has no history of peripheral artery disease, has not smoked in the past 90 days, denies any history of treated diabetes, has no relevant  family history of coronary artery disease (first degree relative at less than age 58) and does not have an elevated BMI (>=30).   Past Medical History:  Diagnosis Date  . AAA (abdominal aortic aneurysm) ([place])   . CHF (congestive heart failure) ([place])   . Coronary artery disease    a. CABG 2001 with early failure of VG-RCA. b. Inf MI after Lexiscan 01/28/2009 s/p RCA stent. c. Stent to dRCA 02/24/09. d. DES to [place]  2012. e. NSTEMI s/p angioplasty for ISR of RCA 09/2011. f. 05/2012 Cath: LM 20-30p, LAD 100p, LCX min irregs, RCA patent prox stent, 79m ISR, VG->Diag nl w/ dzs in D1 prox to graft, VG->RCA 100, VG->OM nl, LIMA->LAD nl, Nl EF->Med Rx; g. 09/2014 low-risk MV.  Marland Kitchen Dyspnea   . GERD (gastroesophageal reflux disease)   . History of echocardiogram    a. 09/2014 Echo: EF 55-60%, mild basal-midinferior HK, triv AI.  Marland Kitchen Hyperlipidemia   . Hypertension   . Hypertensive heart disease   . Hypothyroidism   . Myocardial infarction ([place])   . Osteoarthritis    a.End-stage osteoarthritis, right knee, medial compartment  . Personal history of tobacco use   . Prostate cancer ([place])   . Skin cancer    melanoma  . Type II diabetes mellitus [facility])     Patient Active Problem List   Diagnosis Date Noted  . Viral gastroenteritis 06/07/2018  . Hypoglycemia 06/06/2018  . AKI (acute kidney injury) ([place]) 06/06/2018  . Chronic anemia 06/06/2018  .  CKD (chronic kidney disease) stage 3, GFR 30-59 ml/min 06/06/2018  . Hypokalemia 06/06/2018  . BPH (benign prostatic hyperplasia) 06/06/2018  . Malignant neoplasm of prostate ([place]) 06/04/2018  . Intractable nausea and vomiting 01/13/2018  . S/P PTCA (percutaneous transluminal coronary angioplasty) 11/04/2017  . Angina pectoris ([place]) 10/15/2017  . Abdominal aortic aneurysm (AAA) without rupture ([place]) 08/10/2016  . Acute on chronic kidney failure-II 08/09/2016  . GERD (gastroesophageal reflux disease) 08/08/2016  . Gastroenteritis 08/08/2016  . Sepsis  ([place]) 08/08/2016  . Cough 08/08/2016  . Hypertensive heart disease   . Type II diabetes mellitus ([place])   . Thrombocytopenia ([place]) 06/08/2012  . NSTEMI (non-ST elevated myocardial infarction) ([place]) 10/09/2011  . Unstable angina ([place]) 10/08/2011  . Colitis 07/06/2011  . Diabetes mellitus ([place]) 07/05/2011  . Diarrhea 07/05/2011  . Weakness generalized 07/05/2011  . Fatigue 11/11/2010  . CAD (coronary artery disease)   . Hypertension   . Hypothyroidism   . Hyperlipidemia   . SOB (shortness of breath) on exertion   . Personal history of tobacco use     Past Surgical History:  Procedure Laterality Date  . CARDIAC CATHETERIZATION    . CARDIAC CATHETERIZATION N/A 12/06/2015   Procedure: Left Heart Cath and Cors/Grafts Angiography;  Surgeon: Burnell Blanks, MD;  Location: [place] [facility];  Service: Cardiovascular;  Laterality: N/A;  . CARDIAC CATHETERIZATION N/A 12/06/2015   Procedure: Coronary Balloon Angioplasty;  Surgeon: Burnell Blanks, MD;  Location: [place] [facility];  Service: Cardiovascular;  Laterality: N/A;  . CATARACT EXTRACTION    . CORONARY ARTERY BYPASS GRAFT    . EYE SURGERY    . HERNIA REPAIR  1985  . INTRAVASCULAR ULTRASOUND/IVUS N/A 10/15/2017   Procedure: Intravascular Ultrasound/IVUS;  Surgeon: Belva Crome, MD;  Location: [place] [facility];  Service: Cardiovascular;  Laterality: N/A;  . KNEE ARTHROSCOPY  1999 and 2004  . LEFT HEART CATH AND CORS/GRAFTS ANGIOGRAPHY N/A 10/15/2017   Procedure: LEFT HEART CATH AND CORS/GRAFTS ANGIOGRAPHY;  Surgeon: Belva Crome, MD;  Location: [place] [facility];  Service: Cardiovascular;  Laterality: N/A;  . LEFT HEART CATHETERIZATION WITH CORONARY ANGIOGRAM N/A 10/09/2011   Procedure: LEFT HEART CATHETERIZATION WITH CORONARY ANGIOGRAM;  Surgeon: Peter M Martinique, MD;  Location: [facility] CATH LAB;  Service: Cardiovascular;  Laterality: N/A;  . LEFT HEART CATHETERIZATION WITH CORONARY/GRAFT ANGIOGRAM N/A 06/06/2012    Procedure: LEFT HEART CATHETERIZATION WITH Beatrix Fetters;  Surgeon: Peter M Martinique, MD;  Location: [facility] C[facility] LAB;  Service: Cardiovascular;  Laterality: N/A;       Family History  Problem Relation Age of Onset  . Hypertension Mother   . Unexplained death Mother   . Throat cancer Sister   . Lung cancer Sister   . Heart disease Brother   . Pneumonia Brother   . Heart disease Brother   . Prostate cancer Brother   . Unexplained death Brother        at birth  . Unexplained death Brother   . Prostate cancer Son   . Lung cancer Son   . Prostate cancer Other     Social History   Tobacco Use  . Smoking status: Former Smoker    Packs/day: 1.00    Years: 20.00    Pack years: 20.00    Types: Cigarettes    Quit date: 08/01/1975    Years since quitting: 44.4  . Smokeless tobacco: Never Used  Substance Use Topics  . Alcohol use: No  . Drug  use: No    Home Medications Prior to Admission medications   Medication Sig Start Date End Date Taking? Authorizing Provider  acetaminophen (TYLENOL) 500 MG tablet Take 500-1,000 mg by mouth every 8 (eight) hours as needed for mild pain or headache.    Yes [provider]  aspirin 81 MG tablet Take 1 tablet (81 mg total) by mouth daily. 10/10/11  Yes [name], NP  aspirin buffered (BUFFERIN) 325 MG TABS tablet Take 325 mg by mouth as needed (for pain or headaches).   Yes [provider]  clopidogrel (PLAVIX) 75 MG tablet Take 1 tablet (75 mg total) by mouth daily. 08/26/19  Yes [name], [name], MD  enzalutamide Gillermina Phy) 40 MG capsule Take 160 mg by mouth at bedtime.   Yes [provider]  ferrous sulfate 325 (65 FE) MG EC tablet Take 325-650 mg by mouth See admin instructions. Take 325 mg by mouth in the morning after breakfast and 650 mg after supper   Yes [provider]  fluocinonide (LIDEX) 0.05 % external solution Apply 1 mL topically 2 (two) times daily as needed (dryness and itching  of the scalp).  12/24/17  Yes [provider]  glimepiride (AMARYL) 4 MG tablet Take 4 mg by mouth daily with breakfast.   Yes [provider]  isosorbide mononitrate (IMDUR) 120 MG 24 hr tablet TAKE 1 TABLET BY MOUTH  DAILY Patient taking differently: Take 120 mg by mouth at bedtime.  11/11/19  Yes [name], [name], MD  levothyroxine (SYNTHROID, LEVOTHROID) 100 MCG tablet Take 100 mcg by mouth daily before breakfast.    Yes [provider]  metFORMIN (GLUCOPHAGE) 1000 MG tablet Take 500-1,000 mg by mouth See admin instructions. Take 1,000 mg by mouth in the morning before breakfast and 500 mg at bedtime   Yes [provider]  metoprolol tartrate (LOPRESSOR) 25 MG tablet TAKE 1 TABLET BY MOUTH TWO  TIMES DAILY Patient taking differently: Take 25 mg by mouth 2 (two) times daily.  08/25/19  Yes [name], [name], MD  Multiple Vitamins-Minerals (PRESERVISION AREDS 2) CAPS Take 1 capsule by mouth 2 (two) times daily with a meal.   Yes [provider]  ondansetron (ZOFRAN ODT) 4 MG disintegrating tablet Take 1 tablet (4 mg total) by mouth every 8 (eight) hours as needed. Patient taking differently: Take 4 mg by mouth every 8 (eight) hours as needed for nausea (DISSOLVE ORALLY).  02/07/18  Yes [name], MD  pantoprazole (PROTONIX) 40 MG tablet TAKE 1 TABLET BY MOUTH  TWICE DAILY Patient taking differently: Take 40 mg by mouth See admin instructions. Take 40 mg by mouth in the morning before breakfast and an additional 40 mg at bedtime as needed for reflux 09/08/19  Yes [name], [name], MD  Polyethyl Glycol-Propyl Glycol (SYSTANE FREE OP) Place 1 drop into both eyes every 8 (eight) hours as needed (for dry eyes).    Yes [provider]  potassium chloride (KLOR-CON) 10 MEQ tablet TAKE 1 TABLET BY MOUTH AT  NIGHT Patient taking differently: Take 10 mEq by mouth at bedtime.  11/11/19  Yes [name], [name], MD  rosuvastatin (CRESTOR) 20 MG tablet Take 1  tablet (20 mg total) by mouth daily. Patient taking differently: Take 20 mg by mouth at bedtime.  04/04/19 03/29/20 Yes [name], [name], MD  tamsulosin (FLOMAX) 0.4 MG CAPS capsule Take 0.4 mg by mouth in the morning and at bedtime.   Yes [provider]  hydrochlorothiazide (HYDRODIURIL) 25 MG tablet Take 1 tablet (25 mg total) by mouth daily. 12/19/13   [name], [name], MD  lisinopril (ZESTRIL) 10 MG tablet TAKE 1 TABLET BY MOUTH  DAILY Patient taking differently: Take 10 mg by mouth daily.  06/24/19   [name], [name], MD    Allergies    Codeine  Review of Systems   Review of Systems  Constitutional: Positive for diaphoresis. Negative for chills and fever.  Respiratory: Positive for chest tightness and shortness of breath.   Cardiovascular: Positive for chest pain. Negative for palpitations and leg swelling.  Gastrointestinal: Positive for nausea. Negative for abdominal pain, constipation, diarrhea and vomiting.  Musculoskeletal: Negative for arthralgias and myalgias.  Skin: Negative for rash and wound.  Allergic/Immunologic: Negative for immunocompromised state.  Neurological: Positive for weakness and numbness.  Psychiatric/Behavioral: Negative for confusion.  All other systems reviewed and are negative.   Physical Exam Updated Vital Signs BP 115/85 (BP Location: Right Arm)   Pulse 68   Temp (!) 97.5 F (36.4 C) (Oral)   Resp 16   Ht 5\' 3"  (1.6 m)   Wt 72 kg   SpO2 95%   BMI 28.10 kg/m   Physical Exam Vitals and nursing note reviewed.  Constitutional:      General: He is not in acute distress.    Appearance: He is well-developed. He is not diaphoretic.  HENT:     Head: Normocephalic and atraumatic.  Cardiovascular:     Rate and Rhythm: Normal rate and regular rhythm.     Heart sounds: Normal heart sounds.  Pulmonary:     Effort: Pulmonary effort is normal.     Breath sounds: Normal breath sounds.  Chest:     Chest wall: No tenderness.  Abdominal:      Palpations: Abdomen is soft.     Tenderness: There is abdominal tenderness in the right upper quadrant and right lower quadrant.  Musculoskeletal:     Right lower leg: Edema present.     Left lower leg: Edema present.     Comments: Very slight pitting edema to bilateral ankles  Skin:    General: Skin is warm and dry.     Findings: No erythema or rash.  Neurological:     Mental Status: He is alert and oriented to person, place, and time.  Psychiatric:        Behavior: Behavior normal.     ED Results / Procedures / Treatments   Labs (all labs ordered are listed, but only abnormal results are displayed) Labs Reviewed  BASIC METABOLIC PANEL - Abnormal; Notable for the following components:      Result Value   Glucose, Bld 172 (*)    All other components within normal limits  CBC - Abnormal; Notable for the following components:   Hemoglobin 12.7 (*)    MCH 25.3 (*)    RDW 18.1 (*)    All other components within normal limits  HEPATIC FUNCTION PANEL - Abnormal; Notable for the following components:   Total Protein 6.3 (*)    Albumin 3.4 (*)    Total Bilirubin 1.4 (*)    Bilirubin, Direct 0.5 (*)    All other components within normal limits  BASIC METABOLIC PANEL - Abnormal; Notable for the following components:   Glucose, Bld 134 (*)    Anion gap 16 (*)    All other components within normal limits  CBC - Abnormal; Notable for the following components:   WBC 3.7 (*)  Hemoglobin 11.4 (*)    HCT 35.2 (*)    MCV 79.1 (*)    MCH 25.6 (*)    RDW 18.0 (*)    All other components within normal limits  GLUCOSE, CAPILLARY - Abnormal; Notable for the following components:   Glucose-Capillary 168 (*)    All other components within normal limits  LIPID PANEL - Abnormal; Notable for the following components:   Cholesterol 227 (*)    Triglycerides 255 (*)    HDL 30 (*)    VLDL 51 (*)    LDL Cholesterol 146 (*)    All other components within normal limits  GLUCOSE, CAPILLARY -  Abnormal; Notable for the following components:   Glucose-Capillary 159 (*)    All other components within normal limits  CBG MONITORING, ED - Abnormal; Notable for the following components:   Glucose-Capillary 207 (*)    All other components within normal limits  SARS CORONAVIRUS 2 BY RT PCR (HOSPITAL ORDER, [facility])  LIPASE, BLOOD  TROPONIN I (HIGH SENSITIVITY)  TROPONIN I (HIGH SENSITIVITY)  TROPONIN I (HIGH SENSITIVITY)  TROPONIN I (HIGH SENSITIVITY)    EKG EKG Interpretation  Date/Time:  Tuesday January 06 2020 09:07:40 EDT Ventricular Rate:  79 PR Interval:  146 QRS Duration: 80 QT Interval:  382 QTC Calculation: 438 R Axis:   8 Text Interpretation: Sinus rhythm with Premature atrial complexes Cannot rule out Inferior infarct , age undetermined Possible Anterior infarct , age undetermined Abnormal ECG No acute changes No significant change since last tracing Confirmed by Varney Biles [phone]) on 01/06/2020 1:13:56 PM   Radiology DG Chest 2 View  Result Date: 01/06/2020 CLINICAL DATA:  Chest pain EXAM: CHEST - 2 VIEW COMPARISON:  CT angiogram chest June 13, 2018 and chest radiograph Dec 05, 2015 FINDINGS: Lungs are clear. Heart size and pulmonary vascularity are normal. Patient is status post coronary artery bypass grafting. No adenopathy. There is focal eventration along the right hemidiaphragm, stable. IMPRESSION: No edema or airspace opacity. Stable cardiac silhouette. Stable right hemidiaphragm focal eventration. Electronically Signed   By: Lowella Grip III M.D.   On: 01/06/2020 09:56   US Abdomen Limited  Result Date: 01/06/2020 CLINICAL DATA:  Right upper quadrant pain EXAM: ULTRASOUND ABDOMEN LIMITED RIGHT UPPER QUADRANT COMPARISON:  06/06/2018 FINDINGS: Gallbladder: No gallstones or wall thickening visualized. No sonographic Bernardette Waldron sign noted by sonographer. Common bile duct: Diameter: 3 mm Liver: No focal lesion identified. Within  normal limits in parenchymal echogenicity. Portal vein is patent on color Doppler imaging with normal direction of blood flow towards the liver. Other: Incidental 2.6 cm right renal cyst unchanged. IMPRESSION: 1. Unremarkable right upper quadrant ultrasound. Electronically Signed   By: Randa Ngo M.D.   On: 01/06/2020 15:40    Procedures Procedures (including critical care time)  Medications Ordered in ED Medications  sodium chloride flush (NS) 0.9 % injection 3 mL (0 mLs Intravenous Hold 01/06/20 1103)  hydrochlorothiazide (HYDRODIURIL) tablet 25 mg (25 mg Oral Given 01/07/20 0829)  lisinopril (ZESTRIL) tablet 10 mg (10 mg Oral Given 01/07/20 0830)  metoprolol tartrate (LOPRESSOR) tablet 25 mg (25 mg Oral Given 01/07/20 0828)  rosuvastatin (CRESTOR) tablet 20 mg (20 mg Oral Given 01/07/20 0829)  levothyroxine (SYNTHROID) tablet 100 mcg (100 mcg Oral Given 01/07/20 0603)  pantoprazole (PROTONIX) EC tablet 40 mg (40 mg Oral Given 01/07/20 0829)  tamsulosin (FLOMAX) capsule 0.4 mg (0.4 mg Oral Given 01/06/20 2320)  clopidogrel (PLAVIX) tablet 75 mg (75  mg Oral Given 01/07/20 0829)  potassium chloride (KLOR-CON) CR tablet 10 mEq (10 mEq Oral Given 01/06/20 2319)  acetaminophen (TYLENOL) tablet 650 mg (has no administration in time range)  ondansetron (ZOFRAN) injection 4 mg (has no administration in time range)  heparin injection 5,000 Units (5,000 Units Subcutaneous Given 01/07/20 0604)  nitroGLYCERIN 50 mg in dextrose 5 % 250 mL (0.2 mg/mL) infusion (30 mcg/min Intravenous Handoff 01/07/20 0728)  insulin aspart (novoLOG) injection 0-15 Units (3 Units Subcutaneous Not Given 01/07/20 0830)  sodium chloride flush (NS) 0.9 % injection 3 mL (3 mLs Intravenous Given 01/07/20 0831)  sodium chloride flush (NS) 0.9 % injection 3 mL (has no administration in time range)  0.9 %  sodium chloride infusion (has no administration in time range)  0.9% sodium chloride infusion (0 mL/kg/hr  72.2 kg Intravenous Stopped 01/07/20 0459)      Followed by  0.9% sodium chloride infusion (1 mL/kg/hr  72.2 kg Intravenous New Bag/Given 01/07/20 0500)  aspirin EC tablet 81 mg (has no administration in time range)  aspirin chewable tablet 324 mg (324 mg Oral Given 01/06/20 1131)  aspirin chewable tablet 81 mg (81 mg Oral Given 01/07/20 0604)    ED Course  I have reviewed the triage vital signs and the nursing notes.  Pertinent labs & imaging results that were available during my care of the patient were reviewed by me and considered in my medical decision making (see chart for details).  Clinical Course as of Jan 06 1001  Tue Jan 05, 4529  683 85 year old male with complaint of left side chest pain, initial episode of 2 days ago, return of pain at midnight last night with persistence.  Patient was given aspirin and nitro in the ER with slight improvement in his pain.  On exam was found to have tenderness in the right upper quadrant, hepatic function and ultrasound added to work-up.  EKG without acute ischemic changes, troponins flat had 8 and 8, BMP without significant findings, hepatic function with protein 6.3, albumin 3.4 CBC without significant findings, lipase within limits.  Right upper quadrant ultrasound is pending.  Patient was seen by Dr. Kathrynn Humble, ER attending, recommends consult with cardiology.  Case discussed with cards master, cardiology to consult on patient.   [LM]    Clinical Course User Index [LM] Roque Lias   MDM Rules/Calculators/A&P HEAR Score: 7                    Final Clinical Impression(s) / ED Diagnoses Final diagnoses:  Chest pain, unspecified type    Rx / DC Orders ED Discharge Orders    None       Tacy Learn, PA-C 01/07/20 1002    Varney Biles, MD 01/07/20 1452

## 2020-01-06 NOTE — ED Notes (Signed)
Tele Dinner ordered 

## 2020-01-06 NOTE — ED Triage Notes (Addendum)
Pt arrives to ED with c/o of CP that woke him up all through out the night- pt has extensive cardiac hx with bypass and stent placement. Pt is also undergoing treatment for prostate CA.  Pt adds that Sunday he believes he had a "mild heart attack". Pt states Sunday he had intense pain that lasted for about 2-3 hours and then radiated into her left arm and shoulder blades and since has been feeling weak with a aching in chest and head.

## 2020-01-07 ENCOUNTER — Encounter (HOSPITAL_COMMUNITY)
Admission: EM | Disposition: A | Discharge status: Home / Self Care | Payer: Self-pay | Source: Home / Self Care | Attending: Emergency Medicine

## 2020-01-07 DIAGNOSIS — I200 Unstable angina: Secondary | ICD-10-CM | POA: Diagnosis not present

## 2020-01-07 DIAGNOSIS — I25110 Atherosclerotic heart disease of native coronary artery with unstable angina pectoris: Secondary | ICD-10-CM | POA: Diagnosis not present

## 2020-01-07 DIAGNOSIS — I509 Heart failure, unspecified: Secondary | ICD-10-CM | POA: Diagnosis not present

## 2020-01-07 DIAGNOSIS — I110 Hypertensive heart disease with heart failure: Secondary | ICD-10-CM | POA: Diagnosis not present

## 2020-01-07 DIAGNOSIS — Z20822 Contact with and (suspected) exposure to covid-19: Secondary | ICD-10-CM | POA: Diagnosis not present

## 2020-01-07 HISTORY — PX: LEFT HEART CATH AND CORS/GRAFTS ANGIOGRAPHY: CATH118250

## 2020-01-07 LAB — LIPID PANEL
Cholesterol: 227 mg/dL — ABNORMAL HIGH (ref 0–200)
HDL: 30 mg/dL — ABNORMAL LOW (ref >40)
LDL Cholesterol: 146 mg/dL — ABNORMAL HIGH (ref 0–99)
Total CHOL/HDL Ratio: 7.6 RATIO
Triglycerides: 255 mg/dL — ABNORMAL HIGH (ref <150)
VLDL: 51 mg/dL — ABNORMAL HIGH (ref 0–40)

## 2020-01-07 LAB — GLUCOSE, CAPILLARY
Glucose-Capillary: 159 mg/dL — ABNORMAL HIGH (ref 70–99)
Glucose-Capillary: 155 mg/dL — ABNORMAL HIGH (ref 70–99)
Glucose-Capillary: 121 mg/dL — ABNORMAL HIGH (ref 70–99)

## 2020-01-07 LAB — TROPONIN I (HIGH SENSITIVITY): Troponin I (High Sensitivity): 9 ng/L (ref <18)

## 2020-01-07 LAB — CBC
HCT: 35.2 % — ABNORMAL LOW (ref 39.0–52.0)
Hemoglobin: 11.4 g/dL — ABNORMAL LOW (ref 13.0–17.0)
MCH: 25.6 pg — ABNORMAL LOW (ref 26.0–34.0)
MCHC: 32.4 g/dL (ref 30.0–36.0)
MCV: 79.1 fL — ABNORMAL LOW (ref 80.0–100.0)
Platelets: 167 10*3/uL (ref 150–400)
RBC: 4.45 MIL/uL (ref 4.22–5.81)
RDW: 18 % — ABNORMAL HIGH (ref 11.5–15.5)
WBC: 3.7 10*3/uL — ABNORMAL LOW (ref 4.0–10.5)
nRBC: 0 % (ref 0.0–0.2)

## 2020-01-07 LAB — BASIC METABOLIC PANEL
Anion gap: 16 — ABNORMAL HIGH (ref 5–15)
BUN: 17 mg/dL (ref 8–23)
CO2: 27 mmol/L (ref 22–32)
Calcium: 9.2 mg/dL (ref 8.9–10.3)
Chloride: 100 mmol/L (ref 98–111)
Creatinine, Ser: 0.97 mg/dL (ref 0.61–1.24)
GFR calc Af Amer: >60 mL/min (ref >60)
GFR calc non Af Amer: >60 mL/min (ref >60)
Glucose, Bld: 134 mg/dL — ABNORMAL HIGH (ref 70–99)
Potassium: 4 mmol/L (ref 3.5–5.1)
Sodium: 143 mmol/L (ref 135–145)

## 2020-01-07 SURGERY — LEFT HEART CATH AND CORS/GRAFTS ANGIOGRAPHY
Anesthesia: LOCAL

## 2020-01-07 MED ORDER — HEPARIN SODIUM (PORCINE) 1000 UNIT/ML IJ SOLN
INTRAMUSCULAR | Status: DC | PRN
  Administered 2020-01-07: 5000 [IU] via INTRAVENOUS

## 2020-01-07 MED ORDER — FENTANYL CITRATE (PF) 100 MCG/2ML IJ SOLN
INTRAMUSCULAR | Status: AC
  Filled 2020-01-07: qty 2

## 2020-01-07 MED ORDER — ISOSORBIDE MONONITRATE ER 60 MG PO TB24
120.0000 mg | ORAL_TABLET | Freq: Every day | ORAL | Status: DC
  Administered 2020-01-07: 120 mg via ORAL
  Filled 2020-01-07: qty 2

## 2020-01-07 MED ORDER — LIDOCAINE HCL (PF) 1 % IJ SOLN
INTRAMUSCULAR | Status: DC | PRN
  Administered 2020-01-07: 2 mL

## 2020-01-07 MED ORDER — VERAPAMIL HCL 2.5 MG/ML IV SOLN
INTRAVENOUS | Status: DC | PRN
  Administered 2020-01-07: 10 mL via INTRA_ARTERIAL

## 2020-01-07 MED ORDER — HEPARIN (PORCINE) IN NACL 1000-0.9 UT/500ML-% IV SOLN
INTRAVENOUS | Status: AC
  Filled 2020-01-07: qty 1000

## 2020-01-07 MED ORDER — HYDRALAZINE HCL 20 MG/ML IJ SOLN: 10.0000 mg | INTRAMUSCULAR | Status: DC | PRN

## 2020-01-07 MED ORDER — MIDAZOLAM HCL 5 MG/5ML IJ SOLN
INTRAMUSCULAR | Status: AC
  Filled 2020-01-07: qty 5

## 2020-01-07 MED ORDER — FERROUS SULFATE 325 (65 FE) MG PO TABS
650.0000 mg | ORAL_TABLET | Freq: Every day | ORAL | Status: DC
  Administered 2020-01-07: 650 mg via ORAL
  Filled 2020-01-07: qty 2

## 2020-01-07 MED ORDER — VERAPAMIL HCL 2.5 MG/ML IV SOLN
INTRAVENOUS | Status: AC
  Filled 2020-01-07: qty 2

## 2020-01-07 MED ORDER — HEPARIN (PORCINE) IN NACL 1000-0.9 UT/500ML-% IV SOLN
INTRAVENOUS | Status: DC | PRN
  Administered 2020-01-07 (×2): 500 mL

## 2020-01-07 MED ORDER — LIDOCAINE HCL (PF) 1 % IJ SOLN
INTRAMUSCULAR | Status: AC
  Filled 2020-01-07: qty 30

## 2020-01-07 MED ORDER — PRESERVISION AREDS 2 PO CAPS: 1.0000 | ORAL_CAPSULE | Freq: Two times a day (BID) | ORAL | Status: DC

## 2020-01-07 MED ORDER — HEPARIN SODIUM (PORCINE) 1000 UNIT/ML IJ SOLN
INTRAMUSCULAR | Status: AC
  Filled 2020-01-07: qty 1

## 2020-01-07 MED ORDER — FENTANYL CITRATE (PF) 100 MCG/2ML IJ SOLN
INTRAMUSCULAR | Status: DC | PRN
  Administered 2020-01-07: 25 ug via INTRAVENOUS

## 2020-01-07 MED ORDER — SODIUM CHLORIDE 0.9 % WEIGHT BASED INFUSION: 1.0000 mL/kg/h | INTRAVENOUS | Status: DC

## 2020-01-07 MED ORDER — LABETALOL HCL 5 MG/ML IV SOLN: 10.0000 mg | INTRAVENOUS | Status: DC | PRN

## 2020-01-07 MED ORDER — SODIUM CHLORIDE 0.9% FLUSH: 3.0000 mL | Freq: Two times a day (BID) | INTRAVENOUS | Status: DC

## 2020-01-07 MED ORDER — MIDAZOLAM HCL 2 MG/2ML IJ SOLN
INTRAMUSCULAR | Status: DC | PRN
  Administered 2020-01-07: 1 mg via INTRAVENOUS

## 2020-01-07 MED ORDER — SODIUM CHLORIDE 0.9 % IV SOLN: 250.0000 mL | INTRAVENOUS | Status: DC | PRN

## 2020-01-07 MED ORDER — FERROUS SULFATE 325 (65 FE) MG PO TABS: 325.0000 mg | ORAL_TABLET | Freq: Every day | ORAL | Status: DC

## 2020-01-07 MED ORDER — IOHEXOL 350 MG/ML SOLN
INTRAVENOUS | Status: AC
  Filled 2020-01-07: qty 1

## 2020-01-07 MED ORDER — IOHEXOL 350 MG/ML SOLN
INTRAVENOUS | Status: DC | PRN
  Administered 2020-01-07: 55 mL via INTRA_ARTERIAL

## 2020-01-07 MED ORDER — SODIUM CHLORIDE 0.9% FLUSH: 3.0000 mL | INTRAVENOUS | Status: DC | PRN

## 2020-01-07 SURGICAL SUPPLY — 12 items
CATH INFINITI 5FR AL1 (CATHETERS) ×2 IMPLANT
CATH INFINITI 5FR MULTPACK ANG (CATHETERS) ×2 IMPLANT
DEVICE RAD COMP TR BAND LRG (VASCULAR PRODUCTS) ×2 IMPLANT
GLIDESHEATH SLEND SS 6F .021 (SHEATH) ×2 IMPLANT
GUIDEWIRE INQWIRE 1.5J.035X260 (WIRE) ×1 IMPLANT
INQWIRE 1.5J .035X260CM (WIRE) ×2
KIT HEART LEFT (KITS) ×2 IMPLANT
PACK CARDIAC CATHETERIZATION (CUSTOM PROCEDURE TRAY) ×2 IMPLANT
SHIELD RADPAD SCOOP 12X17 (MISCELLANEOUS) ×2 IMPLANT
SYR MEDRAD MARK V 150ML (SYRINGE) ×2 IMPLANT
TRANSDUCER W/STOPCOCK (MISCELLANEOUS) ×2 IMPLANT
TUBING CIL FLEX 10 FLL-RA (TUBING) ×2 IMPLANT

## 2020-01-07 NOTE — Progress Notes (Signed)
Spoke with nurse- still deflating TR band. Will sign out to fellow on call to be aware. Asked nurse to page on-call fellow when TR band is successfully off - discharge paperwork is done, would just need verbal confirmation patient is OK to go. Gracin Soohoo PA-C

## 2020-01-07 NOTE — Progress Notes (Addendum)
Progress Note  Patient Name: Benjamin Harrison Date of Encounter: 01/07/2020  Primary Cardiologist: Mertie Moores, MD  Subjective   Chest pain has eased off - reports that he always has some degree of on/off discomfort, but overall feeling much better. Wife of 65 years at bedside.  Inpatient Medications    Scheduled Meds: . [START ON 01/08/2020] aspirin EC  81 mg Oral Daily  . clopidogrel  75 mg Oral Daily  . heparin  5,000 Units Subcutaneous Q8H  . hydrochlorothiazide  25 mg Oral Daily  . insulin aspart  0-15 Units Subcutaneous TID WC  . levothyroxine  100 mcg Oral QAC breakfast  . lisinopril  10 mg Oral Daily  . metoprolol tartrate  25 mg Oral BID  . pantoprazole  40 mg Oral BID  . potassium chloride  10 mEq Oral QHS  . rosuvastatin  20 mg Oral Daily  . sodium chloride flush  3 mL Intravenous Once  . sodium chloride flush  3 mL Intravenous Q12H  . tamsulosin  0.4 mg Oral QPC supper   Continuous Infusions: . sodium chloride    . sodium chloride 1 mL/kg/hr (01/07/20 0500)  . nitroGLYCERIN Stopped (01/06/20 1956)   PRN Meds: sodium chloride, acetaminophen, ondansetron (ZOFRAN) IV, sodium chloride flush   Vital Signs    Vitals:   01/06/20 2317 01/06/20 2318 01/07/20 0341 01/07/20 0802  BP: (!) 153/91 (!) 153/91 111/74 115/85  Pulse:  80 69 68  Resp:    16  Temp:  98 F (36.7 C) 98.3 F (36.8 C) (!) 97.5 F (36.4 C)  TempSrc:  Oral Oral Oral  SpO2:  99% 94% 95%  Weight:   72 kg   Height:        Intake/Output Summary (Last 24 hours) at 01/07/2020 1005 Last data filed at 01/07/2020 0630 Gross per 24 hour  Intake 631.53 ml  Output 595 ml  Net 36.53 ml   Last 3 Weights 01/07/2020 01/06/2020 01/06/2020  Weight (lbs) 158 lb 10.3 oz 159 lb 3.2 oz 159 lb 3.2 oz  Weight (kg) 71.96 kg 72.213 kg 72.213 kg     Telemetry    NSR, occasional PACs/PVCs- Personally Reviewed  ECG    NSR/SA 67bpm with possible PACs, nonspecific STT changes  - Personally Reviewed  Physical  Exam   GEN: No acute distress.  HEENT: Normocephalic, atraumatic, sclera non-icteric. Neck: No JVD or bruits. Cardiac: RRR no murmurs, rubs, or gallops.  Radials/DP/PT 1+ and equal bilaterally.  Respiratory: Clear to auscultation bilaterally. Breathing is unlabored. GI: Soft, nontender, non-distended, BS +x 4. MS: no deformity. Extremities: No clubbing or cyanosis. No edema. Distal pedal pulses are 2+ and equal bilaterally. Neuro:  AAOx3. Follows commands. Psych:  Responds to questions appropriately with a normal affect.  Labs    High Sensitivity Troponin:   Recent Labs  Lab 01/06/20 0920 01/06/20 1117 01/06/20 2126 01/07/20 0029  TROPONINIHS 8 8 9 9       Cardiac EnzymesNo results for input(s): TROPONINI in the last 168 hours. No results for input(s): TROPIPOC in the last 168 hours.   Chemistry Recent Labs  Lab 01/06/20 0920 01/06/20 1122 01/07/20 0029  NA 137  --  143  K 3.8  --  4.0  CL 100  --  100  CO2 25  --  27  GLUCOSE 172*  --  134*  BUN 16  --  17  CREATININE 0.95  --  0.97  CALCIUM 9.4  --  9.2  PROT  --  6.3*  --   ALBUMIN  --  3.4*  --   AST  --  31  --   ALT  --  13  --   ALKPHOS  --  101  --   BILITOT  --  1.4*  --   GFRNONAA >60  --  >60  GFRAA >60  --  >60  ANIONGAP 12  --  16*     Hematology Recent Labs  Lab 01/06/20 0920 01/07/20 0029  WBC 4.6 3.7*  RBC 5.01 4.45  HGB 12.7* 11.4*  HCT 40.4 35.2*  MCV 80.6 79.1*  MCH 25.3* 25.6*  MCHC 31.4 32.4  RDW 18.1* 18.0*  PLT 190 167    BNPNo results for input(s): BNP, PROBNP in the last 168 hours.   DDimer No results for input(s): DDIMER in the last 168 hours.   Radiology    DG Chest 2 View  Result Date: 01/06/2020 CLINICAL DATA:  Chest pain EXAM: CHEST - 2 VIEW COMPARISON:  CT angiogram chest June 13, 2018 and chest radiograph Dec 05, 2015 FINDINGS: Lungs are clear. Heart size and pulmonary vascularity are normal. Patient is status post coronary artery bypass grafting. No  adenopathy. There is focal eventration along the right hemidiaphragm, stable. IMPRESSION: No edema or airspace opacity. Stable cardiac silhouette. Stable right hemidiaphragm focal eventration. Electronically Signed   By: Lowella Grip III M.D.   On: 01/06/2020 09:56   US Abdomen Limited  Result Date: 01/06/2020 CLINICAL DATA:  Right upper quadrant pain EXAM: ULTRASOUND ABDOMEN LIMITED RIGHT UPPER QUADRANT COMPARISON:  06/06/2018 FINDINGS: Gallbladder: No gallstones or wall thickening visualized. No sonographic Murphy sign noted by sonographer. Common bile duct: Diameter: 3 mm Liver: No focal lesion identified. Within normal limits in parenchymal echogenicity. Portal vein is patent on color Doppler imaging with normal direction of blood flow towards the liver. Other: Incidental 2.6 cm right renal cyst unchanged. IMPRESSION: 1. Unremarkable right upper quadrant ultrasound. Electronically Signed   By: Randa Ngo M.D.   On: 01/06/2020 15:40    Cardiac Studies   Pending cath  Patient Profile     85 y.o. male with h/o CABG x 4 (2001), PTCA/stenting of RCA in 2012+2013, ISR of RCA stents s/p cutting balloon angioplasty in 2017, HTN, HLD, DM, iron deficiency anemia (followed by heme-onc), 3.6cm AAA by CT/US 06/2018 and prostate cancer who is being seen today for the evaluation of chest pain at the request of Dr. Kathrynn Humble.  Assessment & Plan    1. CAD with prior h/o CABG, PCI - presenting with symptoms concerning for crescendo angina. Troponins negative. Continue ASA, Plavix, BB, statin. Plan cath today. Risks/benefits d/w patient yesterday and he remains agreeable. No recurrent CP.  2. Essential HTN - BP is normal. Creatinine WNL. Continue present regimen.  3. Hyperlipidemia - lipid panel markedly different than 07/2019 when LDL was 33. LDL 146 but this was drawn shortly after midnight, not really a fasting lab draw. Will repeat tomorrow AM before breakfast. He reports his cancer doctor had told  him to go off for a week at a time and he'd done so recently. Those notes are not in Epic - his hematologist's note indicates prostate CA is followed through urology. Continue statin for now while inpatient. Anticipate f/u as outpatient.  4. DM - on SSI. Anticipate resuming home oral agents when no longer NPO for procedures (will need to hold metformin at least 48 hours post-cath).   5. AAA - last  assessed 06/2018, overdue for VVS follow-up - will need to see as outpatient.   For questions or updates, please contact [place] Please consult [email or web address] for contact info under Cardiology/STEMI.  Signed, Charlie Pitter, PA-C 01/07/2020, 10:05 AM    Patient seen, examined. Available data reviewed. Agree with findings, assessment, and plan as outlined by Melina Copa, PA-C.  On my exam the patient is alert, oriented, in no distress.  Lungs are clear, heart is regular rate and rhythm with a 2/6 systolic murmur heard throughout, abdomen is soft and nontender, extremities show no edema, skin is warm dry with no rash.  The patient had some heaviness in the chest overnight and is currently on nitroglycerin IV 30 mics.  Plans for cardiac catheterization and possible PCI reviewed with him today.  He understands and agrees to proceed.  Risks, indications, and alternatives were extensively reviewed yesterday.  Otherwise as outlined above.  Sherren Mocha, M.D. 01/07/2020 11:08 AM

## 2020-01-07 NOTE — Discharge Summary (Addendum)
Discharge Summary    Patient ID: Benjamin Harrison MRN: [id number]; DOB: Dec 12, 1934  Admit date: 01/06/2020 Discharge date: 01/07/2020  Primary Care Provider: Leonard Downing, MD  Primary Cardiologist: Mertie Moores, MD  Primary Electrophysiologist:  None   Discharge Diagnoses    Principal Problem:   Unstable angina [facility]) Active Problems:   CAD (coronary artery disease)   Hypertension   Hyperlipidemia   Type II diabetes mellitus ([place])   Abdominal aortic aneurysm (AAA) without rupture [facility])    Diagnostic Studies/Procedures    LHC 01/07/20 1. Severe native vessel CAD with total occlusion of the LAD and RCA, severe stenosis of the distal circumflex, patency of the left main and proximal circumflex into the first OM 2. S/P CABG with patency of the LIMA-LAD, SVG-diagonal (stable moderate stenosis), and SVG-OM) 3. Interval occlusion of the RCA now with left-to-right collaterals (previously the vessel was severely diseased with multiple stents in place) 4. Mild segmental LV systolic dysfunction with LVEF 45%, normal LVEDP  Recommend: continued medical therapy. OK for hospital discharge today.    _____________   History of Present Illness     Benjamin Harrison is a 85 y.o. male with h/o CABG x 4 (2001), PTCA/stenting of RCA in 2012+2013, ISR of RCA stents s/p cutting balloon angioplasty in 2017, HTN, HLD, DM, iron deficiency anemia (followed by heme-onc), 3.6cm AAA by CT/US 06/2018 and prostate cancerwho [facility] with [facility].  Cardiac history as noted above with last LHC 09/2017 with continued patency of 3/4 grafts, cutting balloon to 80% in-stent stenosis in the RCA. Echo at that time with EF 60-65%, mild LVH, mild AR, and mild MR. He presented to the hospital yesterday with intermittent chest pains, similar to prior angina. This started early Sunday morning, rated as a 8/10, lasted 2-3 hours, and was associated with nausea, SOB, and diaphoresis. He  did not call our office, seek care, or take nitro. He was symptom-free the rest of Sunday and Monday. The evening prior to admission, he had another bout of chest pain while lying in bed. CP radiated to left should and between shoulder blades. He also had numbness in his left fingers, SOB, and diaphoresis. On arrival, he was started on nitro drip and pain improved. Of note he had just had a RUQ [place] earlier that day for some abdominal pain by another provider that was unrevealing. His troponins were negative. He was admitted for further evaluation.   Hospital Course     1. Chest pain/possible unstable angina with history of CAD with prior h/o CABG, PCI - he ruled out for MI. He felt much better overnight into this morning. Due to chest pain concerning for angina, he underwent cardiac catheterization as outlined above with severe native disease, patency of LIMA-LAD, SVG-diagonal (stable moderate stenosis), and SVG-OM. He had interval occlusion of the RCA now with left-to-right collaterals (previously the vessel was severely diseased with multiple stents in place). There was mild segmental LV systolic dysfunction with LVEF 45%, normal LVEDP. Medical therapy recommended. He tolerated cardiac cath without acute complication. Per Dr. Burt Knack, patient OK to be discharged today without any new medicine changes. (We did advise the patient refrain from taking additional aspirin PRN pain, since he is on DAPT already). He will continue baby ASA, Plavix, BB, Imdur and statin.  I did verify with pharmacy that the patient's Xtandi did not require holding post-cath.  2. Essential HTN - BP was generally normal this admission. Creatinine WNL. Continue  present regimen.  3. Hyperlipidemia - lipid panel markedly different than 07/2019 when LDL was 33. LDL 146 but this was drawn shortly after midnight, not really a fasting lab draw. He reports his cancer doctor had told him to go off for a week at a time and he'd done so  recently. Those notes are not in Epic - his hematologist's note indicates prostate CA is followed through urology. He resumed this as inpatient and tolerated well. He was advised to touch base with his oncologist and let us know if there are any concerns about being back on this. He will need repeat lab tests as outpatient once it is known if he can continue this.  4. DM - on SSI. Resume home oral regimen tomorrow, except Metformin which he will be asked to restart on 01/10/20 given catheterization dye.  5. AAA - last assessed 06/2018, overdue for VVS follow-up - recommend patient call their office for f/u outpatient.  Dr. Burt Knack has seen and examined the patient today and feels he is stable for discharge. I have sent a message to our office's scheduling team requesting a follow-up appointment, and our office will call the patient with this information. I did verbally discuss with nursing about an necessary correction on his AVS - under Discharge Instructions the date for resuming metformin should be 01/10/20. The nurse reported that they would hand-correct this on the AVS since it was already printed. I also verbally spoke with the patient about his instructions.  Did the patient have an acute coronary syndrome (MI, NSTEMI, STEMI, etc) this admission?:  No                               Did the patient have a percutaneous coronary intervention (stent / angioplasty)?:  No.   _____________  Discharge Vitals Blood pressure (!) 151/79, pulse (!) 56, temperature 97.7 F (36.5 C), temperature source Oral, resp. rate 10, height 5\' 3"  (1.6 m), weight 72 kg, SpO2 96 %.  Filed Weights   01/06/20 2114 01/06/20 2115 01/07/20 0341  Weight: 72.2 kg 72.2 kg 72 kg    Labs & Radiologic Studies    CBC Recent Labs    01/06/20 0920 01/07/20 0029  WBC 4.6 3.7*  HGB 12.7* 11.4*  HCT 40.4 35.2*  MCV 80.6 79.1*  PLT 190 983   Basic Metabolic Panel Recent Labs    01/06/20 0920 01/07/20 0029  NA 137 143    K 3.8 4.0  CL 100 100  CO2 25 27  GLUCOSE 172* 134*  BUN 16 17  CREATININE 0.95 0.97  CALCIUM 9.4 9.2   Liver Function Tests Recent Labs    01/06/20 1122  AST 31  ALT 13  ALKPHOS 101  BILITOT 1.4*  PROT 6.3*  ALBUMIN 3.4*   Recent Labs    01/06/20 1122  LIPASE 24   High Sensitivity Troponin:   Recent Labs  Lab 01/06/20 0920 01/06/20 1117 01/06/20 2126 01/07/20 0029  TROPONINIHS 8 8 9 9     Fasting Lipid Panel Recent Labs    01/07/20 0029  CHOL 227*  HDL 30*  LDLCALC 146*  TRIG 255*  CHOLHDL 7.6   _____________  DG Chest 2 View  Result Date: 01/06/2020 CLINICAL DATA:  Chest pain EXAM: CHEST - 2 VIEW COMPARISON:  CT angiogram chest June 13, 2018 and chest radiograph Dec 05, 2015 FINDINGS: Lungs are clear. Heart size and pulmonary vascularity are normal.  Patient is status post coronary artery bypass grafting. No adenopathy. There is focal eventration along the right hemidiaphragm, stable. IMPRESSION: No edema or airspace opacity. Stable cardiac silhouette. Stable right hemidiaphragm focal eventration. Electronically Signed   By: Lowella Grip III M.D.   On: 01/06/2020 09:56   CARDIAC CATHETERIZATION  Result Date: 01/07/2020 1. Severe native vessel CAD with total occlusion of the LAD and RCA, severe stenosis of the distal circumflex, patency of the left main and proximal circumflex into the first OM 2. S/P CABG with patency of the LIMA-LAD, SVG-diagonal (stable moderate stenosis), and SVG-OM) 3. Interval occlusion of the RCA now with left-to-right collaterals (previously the vessel was severely diseased with multiple stents in place) 4. Mild segmental LV systolic dysfunction with LVEF 45%, normal LVEDP Recommend: continued medical therapy. OK for hospital discharge today.  US Abdomen Limited  Result Date: 01/06/2020 CLINICAL DATA:  Right upper quadrant pain EXAM: ULTRASOUND ABDOMEN LIMITED RIGHT UPPER QUADRANT COMPARISON:  06/06/2018 FINDINGS: Gallbladder: No  gallstones or wall thickening visualized. No sonographic Murphy sign noted by sonographer. Common bile duct: Diameter: 3 mm Liver: No focal lesion identified. Within normal limits in parenchymal echogenicity. Portal vein is patent on color Doppler imaging with normal direction of blood flow towards the liver. Other: Incidental 2.6 cm right renal cyst unchanged. IMPRESSION: 1. Unremarkable right upper quadrant ultrasound. Electronically Signed   By: Randa Ngo M.D.   On: 01/06/2020 15:40   Disposition   Pt is being discharged home today in good condition.  Follow-up Plans & Appointments    Follow-up Information    [name], [name], MD Follow up.   Specialty: Cardiology Why: Our office will call you for a follow-up appointment with either Dr. Acie Fredrickson or one of his PAs/nurse practitioners. Please call the office if you have not heard from [place] within 3 days. Contact information: [place] [address] [phone]        Angelia Mould, MD Follow up.   Specialties: Vascular Surgery, Cardiology Why: You appear to be overdue for follow-up for your AAA (abdominal aortic aneurysm) with Dr. Scot Dock. Please contact his office to schedule your appointment. Contact information: [address] [place]  [address] [phone]          Discharge Instructions    Diet - low sodium heart healthy   Complete by: As directed    Discharge instructions   Complete by: As directed    IMPORTANT!!!!! DO NOT RESTART YOUR METFORMIN UNTIL Saturday June 12th, 2021.  Since you are already on scheduled Aspirin and Plavix/clopidogrel, we would advise against taking additional Aspirin as-needed for pain since this can increase risk of bleeding. That is why this was discontinued off your medicine list. You may talk to your primary care provider about alternatives.  Please speak with your cancer doctor about restarting your cholesterol medicine (Crestor/rosuvastatin). Given  your heart disease, it is beneficial if you are able to continue this. If they have any concerns about you being on this medicine long term, please have them contact our office to discuss.   Increase activity slowly   Complete by: As directed    No driving for 3 days. No lifting over 5 lbs for 1 week. No sexual activity for 1 week. Keep procedure site clean & dry. If you notice increased pain, swelling, bleeding or pus, call/return!  You may shower, but no soaking baths/hot tubs/pools for 1 week.      Discharge Medications   Allergies  as of 01/07/2020      Reactions   Codeine Nausea Only   Pt reports this happens off and on (??)      Medication List    STOP taking these medications   Bufferin 325 MG Tabs tablet Generic drug: aspirin buffered     TAKE these medications   acetaminophen 500 MG tablet Commonly known as: TYLENOL Take 500-1,000 mg by mouth every 8 (eight) hours as needed for mild pain or headache.   aspirin 81 MG tablet Take 1 tablet (81 mg total) by mouth daily.   clopidogrel 75 MG tablet Commonly known as: PLAVIX Take 1 tablet (75 mg total) by mouth daily.   ferrous sulfate 325 (65 FE) MG EC tablet Take 325-650 mg by mouth See admin instructions. Take 325 mg by mouth in the morning after breakfast and 650 mg after supper   fluocinonide 0.05 % external solution Commonly known as: LIDEX Apply 1 mL topically 2 (two) times daily as needed (dryness and itching of the scalp).   glimepiride 4 MG tablet Commonly known as: AMARYL Take 4 mg by mouth daily with breakfast.   hydrochlorothiazide 25 MG tablet Commonly known as: HYDRODIURIL Take 1 tablet (25 mg total) by mouth daily.   isosorbide mononitrate 120 MG 24 hr tablet Commonly known as: IMDUR TAKE 1 TABLET BY MOUTH  DAILY What changed: when to take this Notes to patient: OK to continue to take at bedtime   levothyroxine 100 MCG tablet Commonly known as: SYNTHROID Take 100 mcg by mouth daily before  breakfast.   lisinopril 10 MG tablet Commonly known as: ZESTRIL TAKE 1 TABLET BY MOUTH  DAILY   metFORMIN 1000 MG tablet Commonly known as: GLUCOPHAGE Take 500-1,000 mg by mouth See admin instructions. Take 1,000 mg by mouth in the morning before breakfast and 500 mg at bedtime Notes to patient: IMPORTANT!!!!! DO NOT RESTART YOUR METFORMIN UNTIL Saturday June 12th, 2021.   metoprolol tartrate 25 MG tablet Commonly known as: LOPRESSOR TAKE 1 TABLET BY MOUTH TWO  TIMES DAILY   ondansetron 4 MG disintegrating tablet Commonly known as: Zofran ODT Take 1 tablet (4 mg total) by mouth every 8 (eight) hours as needed. What changed: reasons to take this   pantoprazole 40 MG tablet Commonly known as: PROTONIX TAKE 1 TABLET BY MOUTH  TWICE DAILY What changed:   when to take this  additional instructions   potassium chloride 10 MEQ tablet Commonly known as: KLOR-CON TAKE 1 TABLET BY MOUTH AT  NIGHT   PreserVision AREDS 2 Caps Take 1 capsule by mouth 2 (two) times daily with a meal.   rosuvastatin 20 MG tablet Commonly known as: CRESTOR Take 1 tablet (20 mg total) by mouth daily. What changed: when to take this Notes to patient: OK to continue to take at bedtime   SYSTANE FREE OP Place 1 drop into both eyes every 8 (eight) hours as needed (for dry eyes).   tamsulosin 0.4 MG Caps capsule Commonly known as: FLOMAX Take 0.4 mg by mouth in the morning and at bedtime.   Xtandi 40 MG capsule Generic drug: enzalutamide Take 160 mg by mouth at bedtime.         Outstanding Labs/Studies   Suggest f/u of lipid profile once established that patient has been back on his statin consistently  Duration of Discharge Encounter   Greater than 30 minutes including physician time.  Signed, Charlie Pitter, PA-C 01/07/2020, 7:28 PM

## 2020-01-07 NOTE — Care Management Obs Status (Signed)
[place] NOTIFICATION   Patient Details  Name: Benjamin Harrison MRN: [id number] Date of Birth: 11-Jan-1935   Medicare Observation Status Notification Given:  Yes    Bethena Roys, RN 01/07/2020, 6:33 PM

## 2020-01-07 NOTE — Progress Notes (Signed)
Discharge instructions given to pt.& was able to understand all discharge instructions.Removed all IV access & applied DSD.Marland KitchenWas  brought down by [facility] & discharged home  With  wife .

## 2020-01-07 NOTE — Progress Notes (Signed)
TRB removed with no complications noted ,no bleeding or hematoma noted ,DSD applied.MD on call Coniglio was made aware & ordered ok to be discharged.

## 2020-01-07 NOTE — Plan of Care (Signed)
  Problem: Education: Goal: Knowledge of General Education information will improve Description: Including pain rating scale, medication(s)/side effects and non-pharmacologic comfort measures Outcome: Completed/Met   Problem: Health Behavior/Discharge Planning: Goal: Ability to manage health-related needs will improve Outcome: Completed/Met   Problem: Clinical Measurements: Goal: Ability to maintain clinical measurements within normal limits will improve Outcome: Completed/Met Goal: Will remain free from infection Outcome: Completed/Met Goal: Diagnostic test results will improve Outcome: Completed/Met Goal: Respiratory complications will improve Outcome: Completed/Met Goal: Cardiovascular complication will be avoided Outcome: Completed/Met   Problem: Activity: Goal: Risk for activity intolerance will decrease Outcome: Completed/Met   Problem: Nutrition: Goal: Adequate nutrition will be maintained Outcome: Completed/Met   Problem: Coping: Goal: Level of anxiety will decrease Outcome: Completed/Met   Problem: Elimination: Goal: Will not experience complications related to bowel motility Outcome: Completed/Met Goal: Will not experience complications related to urinary retention Outcome: Completed/Met   Problem: Pain Managment: Goal: General experience of comfort will improve Outcome: Completed/Met   Problem: Safety: Goal: Ability to remain free from injury will improve Outcome: Completed/Met   Problem: Skin Integrity: Goal: Risk for impaired skin integrity will decrease Outcome: Completed/Met   Problem: Education: Goal: Understanding of CV disease, CV risk reduction, and recovery process will improve Outcome: Completed/Met Goal: Individualized Educational Video(s) Outcome: Completed/Met   Problem: Activity: Goal: Ability to return to baseline activity level will improve Outcome: Completed/Met   Problem: Cardiovascular: Goal: Ability to achieve and maintain  adequate cardiovascular perfusion will improve Outcome: Completed/Met Goal: Vascular access site(s) Level 0-1 will be maintained Outcome: Completed/Met   Problem: Health Behavior/Discharge Planning: Goal: Ability to safely manage health-related needs after discharge will improve Outcome: Completed/Met

## 2020-01-07 NOTE — Care Management CC44 (Signed)
Condition Code 44 Documentation Completed  Patient Details  Name: STIRLING ORTON MRN: [id number] Date of Birth: 19-Feb-1935   Condition Code 44 given:  Yes Patient signature on Condition Code 44 notice:  Yes Documentation of 2 MD's agreement:  Yes Code 44 added to claim:  Yes    Bethena Roys, RN 01/07/2020, 6:33 PM

## 2020-01-07 NOTE — Interval H&P Note (Signed)
History and Physical Interval Note:  01/07/2020 4:42 PM  Benjamin Harrison  has presented today for surgery, with the diagnosis of chest pain.  The various methods of treatment have been discussed with the patient and family. After consideration of risks, benefits and other options for treatment, the patient has consented to  Procedure(s): LEFT HEART CATH AND CORS/GRAFTS ANGIOGRAPHY (N/A) as a surgical intervention.  The patient's history has been reviewed, patient examined, no change in status, stable for surgery.  I have reviewed the patient's chart and labs.  Questions were answered to the patient's satisfaction.     Sherren Mocha

## 2020-01-08 ENCOUNTER — Encounter (HOSPITAL_COMMUNITY): Payer: Self-pay | Admitting: Cardiovascular Disease

## 2020-01-09 ENCOUNTER — Ambulatory Visit: Payer: Medicare Other | Admitting: Hematology and Oncology

## 2020-01-09 ENCOUNTER — Other Ambulatory Visit: Payer: Medicare Other

## 2020-01-27 ENCOUNTER — Ambulatory Visit: Payer: Medicare Other | Admitting: Cardiovascular Disease

## 2020-01-27 ENCOUNTER — Encounter: Payer: Self-pay | Admitting: Cardiovascular Disease

## 2020-01-27 ENCOUNTER — Other Ambulatory Visit: Payer: Self-pay

## 2020-01-27 VITALS — BP 142/76 | HR 58 | Ht 63.0 in | Wt 163.4 lb

## 2020-01-27 DIAGNOSIS — I25708 Atherosclerosis of coronary artery bypass graft(s), unspecified, with other forms of angina pectoris: Secondary | ICD-10-CM | POA: Diagnosis not present

## 2020-01-27 DIAGNOSIS — I119 Hypertensive heart disease without heart failure: Secondary | ICD-10-CM

## 2020-01-27 DIAGNOSIS — I10 Essential (primary) hypertension: Secondary | ICD-10-CM | POA: Diagnosis not present

## 2020-01-27 NOTE — Patient Instructions (Signed)
Medication Instructions:  The current medical regimen is effective;  continue present plan and medications.  *If you need a refill on your cardiac medications before your next appointment, please call your pharmacy*  Follow-Up: At [facility], you and your health needs are our priority.  As part of our continuing mission to provide you with exceptional heart care, we have created designated Provider Care Teams.  These Care Teams include your primary Cardiologist (physician) and Advanced Practice Providers (APPs -  Physician Assistants and Nurse Practitioners) who all work together to provide you with the care you need, when you need it.  We recommend signing up for the patient portal called "MyChart".  Sign up information is provided on this After Visit Summary.  MyChart is used to connect with patients for Virtual Visits (Telemedicine).  Patients are able to view lab/test results, encounter notes, upcoming appointments, etc.  Non-urgent messages can be sent to your provider as well.   To learn more about what you can do with MyChart, go to [email or web address].    Your next appointment:   6 month(s)  The format for your next appointment:   In Person  Provider:   Mertie Moores, MD  Thank you for choosing [facility]!!

## 2020-01-27 NOTE — Progress Notes (Signed)
Cardiology Office Note   Date:  01/27/2020   ID:  Benjamin Harrison, DOB [birth year], MRN [id number]  PCP:  Leonard Downing, MD  Cardiologist:   Mertie Moores, MD   Chief Complaint  Patient presents with   Coronary Artery Disease   1. Coronary artery disease: Status post CABG in2001. He status post stenting of his mid right coronary artery in 10/17/2010 2. Hypertension 3. Hypothyroidism 4. Hyperlipidemia 5. Diabetes mellitus    Benjamin Harrison is a 85 year old gentleman with a history of coronary artery disease. He status post PTCA and stenting of his right coronary. He had repeat stenting of his right coronary artery as recently as March, 2012. He had another heart attack and had severe stenosis of his distal right coronary artery stent. Dr. Martinique opened up the stent in March, 2013. Saphenous vein graft to right coronary artery is occluded.  Since that time he's had persistent headaches and generalized weakness. He's continued to have some episodes of chest pain. He's also had some episodes of lightheadedness.  He's feeling a bit better since I saw him several months ago. He still is not able to exercise with about 15 or 20 minutes. Is clear that he still eats salty foods. He enjoys peanut butter on Ritz crackers almost every night. He also eats occasional country ham and gravy biscuits.  October 07, 2012  He is doing well. He had a cath in November, 2013. He has been on Imdur and is feeling better. He occasionally has a headache.   Nov. 24, 2014:  Benjamin Harrison is having more dyspnea with exertion recently - walking outside, taking a shower.  He avoids salt.  He has not had much if any chest pain.   Feb. 24, 2015: Benjamin Harrison is doing ok. He was having some dyspnea but this has resolved.   March 23, 2014: He is doing ok. He stopped taking his atorvastatin due to leg aches. His aches and pains are better since in the atorvastatin. He has lots of problems with  arthritis and has taken steroids. Staying active except as limited by his joints    Feb. 22, 2016: Benjamin Harrison is a 85 y.o. male who presents for follow up of his CAD. He has been having some chest tightness and pain.  Occurred at night.  Was very weak for 3 days. Was just getting ready for bed.   Radiated out to his shoulder.  Occasionally has exertional CP / last for several minutes.  No palpitations,  No dizziness  ,  Does have some orthostatic hypotension.   Dec 04, 2014: Benjamin Harrison was seen recently for some chest pain   Myoview shows: Overall Impression: There is a small area of moderate scar affecting the apical cap and the apical lateral segment. There is no ischemia. This is a low risk scan. The description of the study from December, 2014 is slightly different from this study. However, after careful review, I am not convinced that there has been any change. There is question of an area of insignificant scar near the apex. There is no ischemia.  LV Ejection Fraction: 58%. LV Wall Motion: Normal Wall Motion   Echo shows: Left ventricle: The cavity size was normal. There was mild focal basal hypertrophy of the septum. Systolic function was normal. The estimated ejection fraction was in the range of 55% to 60%. Probable mild hypokinesis of the basal-midinferior myocardium. Left ventricular diastolic function parameters were normal. - Aortic valve: There was trivial regurgitation.  Aug.  3, 2016:  Doing well. No CP.  We added Imdur at his last visit and is feeling much better.   Has DOE still His CP and dyspnea are better on the Imdur   Jan. 30, 2017:  Doing well.  Occasional CP .  Takes NTG on occasion .  Perhaps once a month   Aug. 29, 2017:  Benjamin Harrison had PCI ( angiosculp cutting balloon ) of his mid and distal right coronary artery on 12/06/2015. Breathing better.   Has taken 1-2 NTG .   Staying active Works out at [employer] 3 days a week.    Gets tired  quicker than he wants to . Also wants to decrease his Nexium Advised Pepcid complete  Feb. 13, 2018:  Benjamin Harrison was hospitalized with gastroenteritis with SIRS in January. He has recovered nicely. He was found to have a small 3.5 cm abdominal aortic aneurysm incidentally.  Doing OK from a cardiac standpoint. Has some occasional chest discomfort .   Does not worsen with exertion .   He thinks it may be gas.    04/27/2017: Benjamin Harrison is seen today for follow-up of his coronary artery disease, hypertension, hyperlipidemia.  C/o right leg pain and burning. Feels cold .  Worse with exertion, better with rest. Has been there for 3 months  Hs some chest pain .   Rest and exertion . Takes NTG Has some DOE  Nov. 1, 2019: Has been diagnosed with prostate cancer  PSA is 37.  Has metastised to some lymph nodes and ribs.  Will be getting Lupron injections and XRT  No CP or dyspnea  Has mild CP if he takes it easy .   April 03, 2019: Benjamin Harrison is doing fairly well.  He has a history of coronary artery disease, hypertension, hyperlipidemia. Has gotten weaker recently .  Has had some episodes of angina that runs under his left arm Occurs spontaneously .  Not necessarily with exercise.  Advised him to try NTG SL and also to try mylanta  Has not been able to exercise much at all .  Various aches and pain .   Very weak from the Lupron injections   Had lots of joint pains.   He held his atorvastatin starting last week    October 02, 2019  Benjamin Harrison is seen tody for follow up of his CAD  Doing ok Is having some palpitations.  HR is irrg today  Quivering in his chest  Continuing to battle prostate cancer  Not Able to get out much , leg weakness and leg pain  No CP   January 27, 2020: Benjamin Harrison is seen today for follow-up of his coronary artery disease.  He was recently hospitalized with symptoms of unstable angina. Heart catheterization revealed severe native coronary artery disease with occlusion of  the LAD and right coronary artery and severe stenosis of the circumflex artery.  He has a patent LIMA to LAD, SVG to diagonal and SVG to OM.  The right coronary artery is now occluded.  He has left to right collaterals.  He has multiple stents in the RCA.  He has mild LV dysfunction with an ejection fraction of around 45%.  He had normal LVEDP at that time. He was discharged on medical therapy.  Seems to be doing well  Still having some chest pain ,  Takes NTG with some relief.   Pain occurs at any time,  Able to do some activities,  Limited by leg pain  We discussed Ranexa.  He does not think he is interested.  His memory seems to be slipping just a bit. Joycelyn Schmid had to help him with some questions / answers today  Past Medical History:  Diagnosis Date   AAA (abdominal aortic aneurysm) ([place])    CHF (congestive heart failure) ([place])    Coronary artery disease    a. CABG 2001 with early failure of VG-RCA. b. Inf MI after Lexiscan 01/28/2009 s/p RCA stent. c. Stent to dRCA 02/24/09. d. DES to [place]  2012. e. NSTEMI s/p angioplasty for ISR of RCA 09/2011. f. 05/2012 Cath: Med Rx; g. Cath 12/2019 -> med rx.   GERD (gastroesophageal reflux disease)    History of echocardiogram    a. 09/2014 Echo: EF 55-60%, mild basal-midinferior HK, triv AI.   Hyperlipidemia    Hypertension    Hypertensive heart disease    Hypothyroidism    Myocardial infarction [facility])    Osteoarthritis    a.End-stage osteoarthritis, right knee, medial compartment   Personal history of tobacco use    Prostate cancer ([place])    Skin cancer    melanoma   Type II diabetes mellitus ([place])     Past Surgical History:  Procedure Laterality Date   CARDIAC CATHETERIZATION     CARDIAC CATHETERIZATION N/A 12/06/2015   Procedure: Left Heart Cath and Cors/Grafts Angiography;  Surgeon: Burnell Blanks, MD;  Location: [place] [facility];  Service: Cardiovascular;  Laterality: N/A;   CARDIAC CATHETERIZATION N/A  12/06/2015   Procedure: Coronary Balloon Angioplasty;  Surgeon: Burnell Blanks, MD;  Location: [place] [facility];  Service: Cardiovascular;  Laterality: N/A;   CATARACT EXTRACTION     CORONARY ARTERY BYPASS GRAFT     EYE SURGERY     HERNIA REPAIR  1985   INTRAVASCULAR ULTRASOUND/IVUS N/A 10/15/2017   Procedure: Intravascular Ultrasound/IVUS;  Surgeon: Belva Crome, MD;  Location: [place] [facility];  Service: Cardiovascular;  Laterality: N/A;   KNEE ARTHROSCOPY  1999 and 2004   LEFT HEART CATH AND CORS/GRAFTS ANGIOGRAPHY N/A 10/15/2017   Procedure: LEFT HEART CATH AND CORS/GRAFTS ANGIOGRAPHY;  Surgeon: Belva Crome, MD;  Location: [place] [facility];  Service: Cardiovascular;  Laterality: N/A;   LEFT HEART CATH AND CORS/GRAFTS ANGIOGRAPHY N/A 01/07/2020   Procedure: LEFT HEART CATH AND CORS/GRAFTS ANGIOGRAPHY;  Surgeon: Sherren Mocha, MD;  Location: [place] [facility];  Service: Cardiovascular;  Laterality: N/A;   LEFT HEART CATHETERIZATION WITH CORONARY ANGIOGRAM N/A 10/09/2011   Procedure: LEFT HEART CATHETERIZATION WITH CORONARY ANGIOGRAM;  Surgeon: Peter M Martinique, MD;  Location[facility] C[facility] LAB;  Service: Cardiovascular;  Laterality: N/A;   LEFT HEART CATHETERIZATION WITH CORONARY/GRAFT ANGIOGRAM N/A 06/06/2012   Procedure: LEFT HEART CATHETERIZATION WITH Beatrix Fetters;  Surgeon: Peter M Martinique, MD;  Location: [facility] CATH LAB;  Service: Cardiovascular;  Laterality: N/A;     Current Outpatient Medications  Medication Sig Dispense Refill   acetaminophen (TYLENOL) 500 MG tablet Take 500-1,000 mg by mouth every 8 (eight) hours as needed for mild pain or headache.      aspirin 81 MG tablet Take 1 tablet (81 mg total) by mouth daily.     clopidogrel (PLAVIX) 75 MG tablet Take 1 tablet (75 mg total) by mouth daily. 90 tablet 2   enzalutamide (XTANDI) 40 MG capsule Take 160 mg by mouth at bedtime.     ferrous sulfate 325 (65 FE) MG EC tablet Take 325-650 mg by mouth See  admin instructions. Take 325 mg  by mouth in the morning after breakfast and 650 mg after supper     fluocinonide (LIDEX) 0.05 % external solution Apply 1 mL topically 2 (two) times daily as needed (dryness and itching of the scalp).   3   isosorbide mononitrate (IMDUR) 120 MG 24 hr tablet TAKE 1 TABLET BY MOUTH  DAILY 90 tablet 3   levothyroxine (SYNTHROID, LEVOTHROID) 100 MCG tablet Take 100 mcg by mouth daily before breakfast.      lisinopril (ZESTRIL) 10 MG tablet TAKE 1 TABLET BY MOUTH  DAILY 90 tablet 2   metFORMIN (GLUCOPHAGE) 1000 MG tablet Take 500-1,000 mg by mouth See admin instructions. Take 1,000 mg by mouth in the morning before breakfast and 500 mg at bedtime     metoprolol tartrate (LOPRESSOR) 25 MG tablet TAKE 1 TABLET BY MOUTH TWO  TIMES DAILY 180 tablet 3   Multiple Vitamins-Minerals (PRESERVISION AREDS 2) CAPS Take 1 capsule by mouth 2 (two) times daily with a meal.     ondansetron (ZOFRAN ODT) 4 MG disintegrating tablet Take 1 tablet (4 mg total) by mouth every 8 (eight) hours as needed. 10 tablet 0   pantoprazole (PROTONIX) 40 MG tablet TAKE 1 TABLET BY MOUTH  TWICE DAILY 180 tablet 2   Polyethyl Glycol-Propyl Glycol (SYSTANE FREE OP) Place 1 drop into both eyes every 8 (eight) hours as needed (for dry eyes).      potassium chloride (KLOR-CON) 10 MEQ tablet TAKE 1 TABLET BY MOUTH AT  NIGHT 90 tablet 3   rosuvastatin (CRESTOR) 20 MG tablet Take 1 tablet (20 mg total) by mouth daily. 90 tablet 3   tamsulosin (FLOMAX) 0.4 MG CAPS capsule Take 0.4 mg by mouth in the morning and at bedtime.     No current facility-administered medications for this visit.    Allergies:   Codeine    Social History:  The patient  reports that he quit smoking about 44 years ago. His smoking use included cigarettes. He has a 20.00 pack-year smoking history. He has never used smokeless tobacco. He reports that he does not drink alcohol and does not use drugs.   Family History:  The  patient's family history includes Heart disease in his brother and brother; Hypertension in his mother; Lung cancer in his sister and son; Pneumonia in his brother; Prostate cancer in his brother, son, and another family member; Throat cancer in his sister; Unexplained death in his brother, brother, and mother.    ROS:  Please see the history of present illness.    Physical Exam: Blood pressure (!) 142/76, pulse (!) 58, height 5\' 3"  (1.6 m), weight 163 lb 6.4 oz (74.1 kg), SpO2 99 %.  GEN:   Elderly , frail gentleman  HEENT: Normal NECK: No JVD; No carotid bruits LYMPHATICS: No lymphadenopathy CARDIAC: RRR , no murmurs, rubs, gallops RESPIRATORY:  Clear to auscultation without rales, wheezing or rhonchi  ABDOMEN: Soft, non-tender, non-distended MUSCULOSKELETAL:  No edema; No deformity ,  Left radial cath site looks good,  Good radial pulse  SKIN: Warm and dry NEUROLOGIC:  Alert and oriented x 3  EKG:      Recent Labs: 07/11/2019: TSH 1.269 01/06/2020: ALT 13 01/07/2020: BUN 17; Creatinine, Ser 0.97; Hemoglobin 11.4; Platelets 167; Potassium 4.0; Sodium 143    Lipid Panel    Component Value Date/Time   CHOL 227 (H) 01/07/2020 0029   CHOL 81 (L) 07/03/2019 0729   TRIG 255 (H) 01/07/2020 0029   HDL 30 (L) 01/07/2020 9983  HDL 27 (L) 07/03/2019 0729   CHOLHDL 7.6 01/07/2020 0029   VLDL 51 (H) 01/07/2020 0029   LDLCALC 146 (H) 01/07/2020 0029   LDLCALC 33 07/03/2019 0729    Wt Readings from Last 3 Encounters:  01/27/20 163 lb 6.4 oz (74.1 kg)  01/07/20 158 lb 10.3 oz (72 kg)  10/10/19 165 lb 12.8 oz (75.2 kg)      Other studies Reviewed: Additional studies/ records that were reviewed today include: . Review of the above records demonstrates:    ASSESSMENT AND PLAN:  1. Coronary artery disease:     Has severe native CAD.   Occluded RCA  But has left to right collaterals.    We discussed starting Ranexa but he does not think he is interested given the cost. Overall his  angina seems to be stable   2. Hypertension -blood pressure overall seems to be fairly well controlled.  He has episodes of dizziness and orthostasis so we are not able to lower his blood pressure quite as much as we would otherwise like.   3. Hypothyroidism - Managed by Dr. Arelia Sneddon   4. Hyperlipidemia -    his last LDL in the hospital was 146.  He had stopped his rosuvastatin because his oncologist had recommended that he stop it temporarily.  He has restarted it .  We will plan on rechecking it again in 6 months.  5. Diabetes mellitus-   Current medicines are reviewed at length with the patient today.  The patient does not have concerns regarding medicines.  The following changes have been made:  See above.    Disposition:   FU with me in 6 months    Signed, Mertie Moores, MD  01/27/2020 9:01 AM    [facility] [address], [place], [place]  [address] Phone: [phone]; Fax: [phone]

## 2020-01-29 ENCOUNTER — Other Ambulatory Visit: Payer: Self-pay | Admitting: Cardiovascular Disease

## 2020-02-10 ENCOUNTER — Other Ambulatory Visit: Payer: Self-pay | Admitting: Cardiovascular Disease

## 2020-02-11 ENCOUNTER — Other Ambulatory Visit: Payer: Self-pay

## 2020-02-11 DIAGNOSIS — I714 Abdominal aortic aneurysm, without rupture, unspecified: Secondary | ICD-10-CM

## 2020-02-18 ENCOUNTER — Ambulatory Visit (INDEPENDENT_AMBULATORY_CARE_PROVIDER_SITE_OTHER): Payer: Medicare Other | Admitting: Vascular Surgery

## 2020-02-18 ENCOUNTER — Ambulatory Visit (HOSPITAL_COMMUNITY)
Admission: RE | Admit: 2020-02-18 | Discharge: 2020-02-18 | Disposition: A | Discharge status: Home / Self Care | Payer: Medicare Other | Source: Ambulatory Visit | Attending: Vascular Surgery | Admitting: Vascular Surgery

## 2020-02-18 ENCOUNTER — Other Ambulatory Visit: Payer: Self-pay

## 2020-02-18 ENCOUNTER — Encounter: Payer: Self-pay | Admitting: Vascular Surgery

## 2020-02-18 VITALS — BP 149/92 | HR 61 | Temp 97.4°F | Resp 20 | Ht 63.0 in | Wt 160.0 lb

## 2020-02-18 DIAGNOSIS — I714 Abdominal aortic aneurysm, without rupture, unspecified: Secondary | ICD-10-CM

## 2020-02-18 NOTE — Progress Notes (Signed)
REASON FOR VISIT:   Follow-up of abdominal aortic aneurysm.  MEDICAL ISSUES:   ABDOMINAL AORTIC ANEURYSM: This patient's abdominal aortic aneurysm has not changed significantly in size.  It is 4.0 cm in maximum diameter.  He understands we would not consider elective repair of an aneurysm in a normal risk patient unless it reached 5.5 cm in maximum diameter.  Fortunately he is not a smoker.  His blood pressure was slightly high today but we have emphasized the importance of tight blood pressure control.  I have ordered a follow-up duplex in 1 year and I will see him back at that time.  He knows to call sooner if he has problems.     HPI:   Benjamin Harrison is a pleasant 85 y.o. male who I have been following with an abdominal aortic aneurysm.  I last saw him on 07/17/2018.  I originally saw him in consultation on 01/16/2018 with a 4.3 cm infrarenal abdominal aortic aneurysm based on the duplex scan. When I saw him last the maximum diameter of his aneurysm was 3.8 cm.  I stretched his follow-up out to 1 year.  Since I saw him last he denies any history of abdominal pain or back pain.  He has been diagnosed with prostate cancer with apparently metastatic disease and this is being treated.  He quit smoking in 1977.  He states that his blood pressure has been under good control.  He does describe some calf claudication bilaterally.  He denies any history of rest pain.  Past Medical History:  Diagnosis Date  . AAA (abdominal aortic aneurysm) ([place])   . CHF (congestive heart failure) ([place])   . Coronary artery disease    a. CABG 2001 with early failure of VG-RCA. b. Inf MI after Lexiscan 01/28/2009 s/p RCA stent. c. Stent to dRCA 02/24/09. d. DES to [place]  2012. e. NSTEMI s/p angioplasty for ISR of RCA 09/2011. f. 05/2012 Cath: Med Rx; g. Cath 12/2019 -> med rx.  Marland Kitchen GERD (gastroesophageal reflux disease)   . History of echocardiogram    a. 09/2014 Echo: EF 55-60%, mild basal-midinferior HK,  triv AI.  Marland Kitchen Hyperlipidemia   . Hypertension   . Hypertensive heart disease   . Hypothyroidism   . Myocardial infarction ([place])   . Osteoarthritis    a.End-stage osteoarthritis, right knee, medial compartment  . Personal history of tobacco use   . Prostate cancer ([place])   . Skin cancer    melanoma  . Type II diabetes mellitus ([facility])     Family History  Problem Relation Age of Onset  . Hypertension Mother   . Unexplained death Mother   . Throat cancer Sister   . Lung cancer Sister   . Heart disease Brother   . Pneumonia Brother   . Heart disease Brother   . Prostate cancer Brother   . Unexplained death Brother        at birth  . Unexplained death Brother   . Prostate cancer Son   . Lung cancer Son   . Prostate cancer Other     SOCIAL HISTORY: Social History   Tobacco Use  . Smoking status: Former Smoker    Packs/day: 1.00    Years: 20.00    Pack years: 20.00    Types: Cigarettes    Quit date: 08/01/1975    Years since quitting: 44.5  . Smokeless tobacco: Never Used  Substance Use Topics  . Alcohol use: No  Allergies  Allergen Reactions  . Codeine Nausea Only    Pt reports this happens off and on (??)    Current Outpatient Medications  Medication Sig Dispense Refill  . acetaminophen (TYLENOL) 500 MG tablet Take 500-1,000 mg by mouth every 8 (eight) hours as needed for mild pain or headache.     Marland Kitchen aspirin 81 MG tablet Take 1 tablet (81 mg total) by mouth daily.    . clopidogrel (PLAVIX) 75 MG tablet Take 1 tablet (75 mg total) by mouth daily. 90 tablet 2  . enzalutamide (XTANDI) 40 MG capsule Take 160 mg by mouth at bedtime.    . ferrous sulfate 325 (65 FE) MG EC tablet Take 325-650 mg by mouth See admin instructions. Take 325 mg by mouth in the morning after breakfast and 650 mg after supper    . fluocinonide (LIDEX) 0.05 % external solution Apply 1 mL topically 2 (two) times daily as needed (dryness and itching of the scalp).   3  . isosorbide mononitrate  (IMDUR) 120 MG 24 hr tablet TAKE 1 TABLET BY MOUTH  DAILY 90 tablet 3  . levothyroxine (SYNTHROID, LEVOTHROID) 100 MCG tablet Take 100 mcg by mouth daily before breakfast.     . lisinopril (ZESTRIL) 10 MG tablet TAKE 1 TABLET BY MOUTH  DAILY 90 tablet 3  . metFORMIN (GLUCOPHAGE) 1000 MG tablet Take 500-1,000 mg by mouth See admin instructions. Take 1,000 mg by mouth in the morning before breakfast and 500 mg at bedtime    . metoprolol tartrate (LOPRESSOR) 25 MG tablet TAKE 1 TABLET BY MOUTH TWO  TIMES DAILY 180 tablet 3  . Multiple Vitamins-Minerals (PRESERVISION AREDS 2) CAPS Take 1 capsule by mouth 2 (two) times daily with a meal.    . ondansetron (ZOFRAN ODT) 4 MG disintegrating tablet Take 1 tablet (4 mg total) by mouth every 8 (eight) hours as needed. 10 tablet 0  . pantoprazole (PROTONIX) 40 MG tablet TAKE 1 TABLET BY MOUTH  TWICE DAILY 180 tablet 2  . Polyethyl Glycol-Propyl Glycol (SYSTANE FREE OP) Place 1 drop into both eyes every 8 (eight) hours as needed (for dry eyes).     . potassium chloride (KLOR-CON) 10 MEQ tablet TAKE 1 TABLET BY MOUTH AT  NIGHT 90 tablet 3  . rosuvastatin (CRESTOR) 20 MG tablet TAKE 1 TABLET BY MOUTH  DAILY 90 tablet 3  . tamsulosin (FLOMAX) 0.4 MG CAPS capsule Take 0.4 mg by mouth in the morning and at bedtime.     No current facility-administered medications for this visit.    REVIEW OF SYSTEMS:  [X]  denotes positive finding, [ ]  denotes negative finding Cardiac  Comments:  Chest pain or chest pressure:    Shortness of breath upon exertion:    Short of breath when lying flat:    Irregular heart rhythm:        Vascular    Pain in calf, thigh, or hip brought on by ambulation: x   Pain in feet at night that wakes you up from your sleep:     Blood clot in your veins:    Leg swelling:         Pulmonary    Oxygen at home:    Productive cough:     Wheezing:         Neurologic    Sudden weakness in arms or legs:     Sudden numbness in arms or legs:      Sudden onset of difficulty speaking or  slurred speech:    Temporary loss of vision in one eye:     Problems with dizziness:         Gastrointestinal    Blood in stool:     Vomited blood:         Genitourinary    Burning when urinating:     Blood in urine:        Psychiatric    Major depression:         Hematologic    Bleeding problems:    Problems with blood clotting too easily:        Skin    Rashes or ulcers:        Constitutional    Fever or chills:     PHYSICAL EXAM:   Vitals:   02/18/20 0937  BP: (!) 149/92  Pulse: 61  Resp: 20  Temp: (!) 97.4 F (36.3 C)  SpO2: 98%  Weight: 72.6 kg  Height: 5\' 3"  (1.6 m)    GENERAL: The patient is a well-nourished male, in no acute distress. The vital signs are documented above. CARDIAC: There is a regular rate and rhythm.  VASCULAR: I do not detect carotid bruits. He has palpable femoral pulses. I cannot palpate pedal pulses. PULMONARY: There is good air exchange bilaterally without wheezing or rales. ABDOMEN: Soft and non-tender with normal pitched bowel sounds.  I cannot palpate his aneurysm because of his body habitus. MUSCULOSKELETAL: There are no major deformities or cyanosis. NEUROLOGIC: No focal weakness or paresthesias are detected. SKIN: There are no ulcers or rashes noted. PSYCHIATRIC: The patient has a normal affect.  DATA:    DUPLEX ABDOMINAL AORTA: I have independently interpreted his duplex of the abdominal aorta.  The maximum diameter of his aneurysm is 4.0 cm.  This is not changed significantly compared to 3.8 cm in December 2019.  The right common iliac artery measures 1.0 cm in maximum diameter.  The left common iliac artery measures 1.0 cm in maximum diameter.  LABS: I reviewed his labs from 01/07/2020.  GFR was greater than 60.  Deitra Mayo [facility] [phone]

## 2020-03-14 ENCOUNTER — Other Ambulatory Visit: Payer: Self-pay | Admitting: Cardiovascular Disease

## 2020-04-09 ENCOUNTER — Other Ambulatory Visit: Payer: Self-pay | Admitting: Physician Assistant

## 2020-04-09 DIAGNOSIS — I714 Abdominal aortic aneurysm, without rupture, unspecified: Secondary | ICD-10-CM

## 2020-04-09 DIAGNOSIS — U071 COVID-19: Secondary | ICD-10-CM

## 2020-04-09 DIAGNOSIS — I25708 Atherosclerosis of coronary artery bypass graft(s), unspecified, with other forms of angina pectoris: Secondary | ICD-10-CM

## 2020-04-09 DIAGNOSIS — N183 Chronic kidney disease, stage 3 unspecified: Secondary | ICD-10-CM

## 2020-04-09 DIAGNOSIS — I10 Essential (primary) hypertension: Secondary | ICD-10-CM

## 2020-04-09 NOTE — Progress Notes (Signed)
I connected by phone with Benjamin Harrison on 04/09/2020 at 8:30 PM to discuss the potential use of a new treatment for mild to moderate COVID-19 viral infection in non-hospitalized patients.  This patient is a 85 y.o. male that meets the FDA criteria for Emergency Use Authorization of COVID monoclonal antibody casirivimab/imdevimab.  Has a (+) direct SARS-CoV-2 viral test result  Has mild or moderate COVID-19   Is NOT hospitalized due to COVID-19  Is within 10 days of symptom onset  Has at least one of the high risk factor(s) for progression to severe COVID-19 and/or hospitalization as defined in EUA.  Specific high risk criteria : Older age (>/= 83 yo), BMI > 25, Chronic Kidney Disease (CKD), Diabetes and Cardiovascular disease or hypertension   I have spoken and communicated the following to the patient or parent/caregiver regarding COVID monoclonal antibody treatment:  1. FDA has authorized the emergency use for the treatment of mild to moderate COVID-19 in adults and pediatric patients with positive results of direct SARS-CoV-2 viral testing who are 102 years of age and older weighing at least 40 kg, and who are at high risk for progressing to severe COVID-19 and/or hospitalization.  2. The significant known and potential risks and benefits of COVID monoclonal antibody, and the extent to which such potential risks and benefits are unknown.  3. Information on available alternative treatments and the risks and benefits of those alternatives, including clinical trials.  4. Patients treated with COVID monoclonal antibody should continue to self-isolate and use infection control measures (e.g., wear mask, isolate, social distance, avoid sharing personal items, clean and disinfect "high touch" surfaces, and frequent handwashing) according to CDC guidelines.   5. The patient or parent/caregiver has the option to accept or refuse COVID monoclonal antibody treatment.  After reviewing this  information with the patient, The patient agreed to proceed with receiving casirivimab\imdevimab infusion and will be provided a copy of the Fact sheet prior to receiving the infusion.  Sx onset 9/8. Set up for infusion on 9/11 @ 3pm. Directions given to [facility]. Pt is aware that insurance will be charged an infusion fee. Pt is fully vaccinated with Moderna.   Angelena Form 04/09/2020 8:30 PM

## 2020-04-10 ENCOUNTER — Ambulatory Visit (HOSPITAL_COMMUNITY)
Admission: RE | Admit: 2020-04-10 | Discharge: 2020-04-10 | Disposition: A | Discharge status: Home / Self Care | Payer: Medicare Other | Source: Ambulatory Visit | Attending: Pulmonary Disease | Admitting: Pulmonary Disease

## 2020-04-10 DIAGNOSIS — U071 COVID-19: Secondary | ICD-10-CM | POA: Insufficient documentation

## 2020-04-10 DIAGNOSIS — I10 Essential (primary) hypertension: Secondary | ICD-10-CM

## 2020-04-10 DIAGNOSIS — Z23 Encounter for immunization: Secondary | ICD-10-CM | POA: Diagnosis not present

## 2020-04-10 DIAGNOSIS — N183 Chronic kidney disease, stage 3 unspecified: Secondary | ICD-10-CM

## 2020-04-10 DIAGNOSIS — I25708 Atherosclerosis of coronary artery bypass graft(s), unspecified, with other forms of angina pectoris: Secondary | ICD-10-CM

## 2020-04-10 DIAGNOSIS — I714 Abdominal aortic aneurysm, without rupture, unspecified: Secondary | ICD-10-CM

## 2020-04-10 MED ORDER — FAMOTIDINE IN NACL 20-0.9 MG/50ML-% IV SOLN: 20.0000 mg | Freq: Once | INTRAVENOUS | Status: DC | PRN

## 2020-04-10 MED ORDER — METHYLPREDNISOLONE SODIUM SUCC 125 MG IJ SOLR: 125.0000 mg | Freq: Once | INTRAMUSCULAR | Status: DC | PRN

## 2020-04-10 MED ORDER — SODIUM CHLORIDE 0.9 % IV SOLN: INTRAVENOUS | Status: DC | PRN

## 2020-04-10 MED ORDER — EPINEPHRINE 0.3 MG/0.3ML IJ SOAJ: 0.3000 mg | Freq: Once | INTRAMUSCULAR | Status: DC | PRN

## 2020-04-10 MED ORDER — DIPHENHYDRAMINE HCL 50 MG/ML IJ SOLN: 50.0000 mg | Freq: Once | INTRAMUSCULAR | Status: DC | PRN

## 2020-04-10 MED ORDER — ALBUTEROL SULFATE HFA 108 (90 BASE) MCG/ACT IN AERS: 2.0000 | INHALATION_SPRAY | Freq: Once | RESPIRATORY_TRACT | Status: DC | PRN

## 2020-04-10 MED ORDER — SODIUM CHLORIDE 0.9 % IV SOLN
1200.0000 mg | Freq: Once | INTRAVENOUS | Status: AC
  Administered 2020-04-10: 1200 mg via INTRAVENOUS
  Filled 2020-04-10: qty 10

## 2020-04-10 NOTE — Discharge Instructions (Signed)

## 2020-04-10 NOTE — Progress Notes (Signed)
  Diagnosis: COVID-19  Physician: Asencion Noble, MD  Procedure: [facility] Infusion Clinic Med: casirivimab\imdevimab infusion - Provided patient with casirivimab\imdevimab fact sheet for patients, parents and caregivers prior to infusion.  Complications: No immediate complications noted.  Discharge: Discharged home   Benjamin Harrison 04/10/2020

## 2020-08-01 NOTE — Progress Notes (Signed)
Cardiology Office Note   Date:  08/02/2020   ID:  Benjamin Harrison, DOB 1934-11-04, MRN [id number]  PCP:  Benjamin Downing, MD  Cardiologist:   Benjamin Moores, MD   Chief Complaint  Patient presents with  . Coronary Artery Disease   1. Coronary artery disease: Status post CABG in2001. He status post stenting of his mid right coronary artery in 10/17/2010 2. Hypertension 3. Hypothyroidism 4. Hyperlipidemia 5. Diabetes mellitus    Benjamin Harrison is a 86 year old gentleman with a history of coronary artery disease. He status post PTCA and stenting of his right coronary. He had repeat stenting of his right coronary artery as recently as March, 2012. He had another heart attack and had severe stenosis of his distal right coronary artery stent. Dr. Martinique opened up the stent in March, 2013. Saphenous vein graft to right coronary artery is occluded.  Since that time he's had persistent headaches and generalized weakness. He's continued to have some episodes of chest pain. He's also had some episodes of lightheadedness.  He's feeling a bit better since I saw him several months ago. He still is not able to exercise with about 15 or 20 minutes. Is clear that he still eats salty foods. He enjoys peanut butter on Ritz crackers almost every night. He also eats occasional country ham and gravy biscuits.  October 07, 2012  He is doing well. He had a cath in November, 2013. He has been on Imdur and is feeling better. He occasionally has a headache.   Nov. 24, 2014:  Benjamin Harrison is having more dyspnea with exertion recently - walking outside, taking a shower.  He avoids salt.  He has not had much if any chest pain.   Feb. 24, 2015: Benjamin Harrison is doing ok. He was having some dyspnea but this has resolved.   March 23, 2014: He is doing ok. He stopped taking his atorvastatin due to leg aches. His aches and pains are better since in the atorvastatin. He has lots of problems with  arthritis and has taken steroids. Staying active except as limited by his joints    Feb. 22, 2016: Benjamin Harrison is a 86 y.o. male who presents for follow up of his CAD. He has been having some chest tightness and pain.  Occurred at night.  Was very weak for 3 days. Was just getting ready for bed.   Radiated out to his shoulder.  Occasionally has exertional CP / last for several minutes.  No palpitations,  No dizziness  ,  Does have some orthostatic hypotension.   Dec 04, 2014: Benjamin Harrison was seen recently for some chest pain   Myoview shows: Overall Impression: There is a small area of moderate scar affecting the apical cap and the apical lateral segment. There is no ischemia. This is a low risk scan. The description of the study from December, 2014 is slightly different from this study. However, after careful review, I am not convinced that there has been any change. There is question of an area of insignificant scar near the apex. There is no ischemia.  LV Ejection Fraction: 58%. LV Wall Motion: Normal Wall Motion   Echo shows: Left ventricle: The cavity size was normal. There was mild focal basal hypertrophy of the septum. Systolic function was normal. The estimated ejection fraction was in the range of 55% to 60%. Probable mild hypokinesis of the basal-midinferior myocardium. Left ventricular diastolic function parameters were normal. - Aortic valve: There was trivial regurgitation.  Aug.  3, 2016:  Doing well. No CP.  We added Imdur at his last visit and is feeling much better.   Has DOE still His CP and dyspnea are better on the Imdur   Jan. 30, 2017:  Doing well.  Occasional CP .  Takes NTG on occasion .  Perhaps once a month   Aug. 29, 2017:  Benjamin Harrison had PCI ( angiosculp cutting balloon ) of his mid and distal right coronary artery on 12/06/2015. Breathing better.   Has taken 1-2 NTG .   Staying active Works out at [employer] 3 days a week.    Gets tired  quicker than he wants to . Also wants to decrease his Nexium Advised Pepcid complete  Feb. 13, 2018:  Benjamin Harrison was hospitalized with gastroenteritis with SIRS in January. He has recovered nicely. He was found to have a small 3.5 cm abdominal aortic aneurysm incidentally.  Doing OK from a cardiac standpoint. Has some occasional chest discomfort .   Does not worsen with exertion .   He thinks it may be gas.    04/27/2017: Benjamin Harrison is seen today for follow-up of his coronary artery disease, hypertension, hyperlipidemia.  C/o right leg pain and burning. Feels cold .  Worse with exertion, better with rest. Has been there for 3 months  Hs some chest pain .   Rest and exertion . Takes NTG Has some DOE  Nov. 1, 2019: Has been diagnosed with prostate cancer  PSA is 37.  Has metastised to some lymph nodes and ribs.  Will be getting Lupron injections and XRT  No CP or dyspnea  Has mild CP if he takes it easy .   April 03, 2019: Benjamin Harrison is doing fairly well.  He has a history of coronary artery disease, hypertension, hyperlipidemia. Has gotten weaker recently .  Has had some episodes of angina that runs under his left arm Occurs spontaneously .  Not necessarily with exercise.  Advised him to try NTG SL and also to try mylanta  Has not been able to exercise much at all .  Various aches and pain .   Very weak from the Lupron injections   Had lots of joint pains.   He held his atorvastatin starting last week    October 02, 2019  Benjamin Harrison is seen tody for follow up of his CAD  Doing ok Is having some palpitations.  HR is irrg today  Quivering in his chest  Continuing to battle prostate cancer  Not Able to get out much , leg weakness and leg pain  No CP   January 27, 2020: Benjamin Harrison is seen today for follow-up of his coronary artery disease.  He was recently hospitalized with symptoms of unstable angina. Heart catheterization revealed severe native coronary artery disease with occlusion of  the LAD and right coronary artery and severe stenosis of the circumflex artery.  He has a patent LIMA to LAD, SVG to diagonal and SVG to OM.  The right coronary artery is now occluded.  He has left to right collaterals.  He has multiple stents in the RCA.  He has mild LV dysfunction with an ejection fraction of around 45%.  He had normal LVEDP at that time. He was discharged on medical therapy.  Seems to be doing well  Still having some chest pain ,  Takes NTG with some relief.   Pain occurs at any time,  Able to do some activities,  Limited by leg pain  We discussed Ranexa.  He does not think he is interested.  His memory seems to be slipping just a bit. Joycelyn Schmid had to help him with some questions / answers today  Jan. 3, 2022  Benjamin Harrison is seen today for follow up of his CAD , CABG No much chest pain  Having lots of leg pain - he thinks might be related to his rosuvastatin  He stopped the rosuvastatin today to see if his leg pains might be the cause of his leg pain Will refer him    Past Medical History:  Diagnosis Date  . AAA (abdominal aortic aneurysm) ([place])   . CHF (congestive heart failure) ([place])   . Coronary artery disease    a. CABG 2001 with early failure of VG-RCA. b. Inf MI after Lexiscan 01/28/2009 s/p RCA stent. c. Stent to dRCA 02/24/09. d. DES to [place]  2012. e. NSTEMI s/p angioplasty for ISR of RCA 09/2011. f. 05/2012 Cath: Med Rx; g. Cath 12/2019 -> med rx.  Marland Kitchen GERD (gastroesophageal reflux disease)   . History of echocardiogram    a. 09/2014 Echo: EF 55-60%, mild basal-midinferior HK, triv AI.  Marland Kitchen Hyperlipidemia   . Hypertension   . Hypertensive heart disease   . Hypothyroidism   . Myocardial infarction ([place])   . Osteoarthritis    a.End-stage osteoarthritis, right knee, medial compartment  . Personal history of tobacco use   . Prostate cancer ([place])   . Skin cancer    melanoma  . Type II diabetes mellitus ([place])     Past Surgical History:  Procedure Laterality  Date  . CARDIAC CATHETERIZATION    . CARDIAC CATHETERIZATION N/A 12/06/2015   Procedure: Left Heart Cath and Cors/Grafts Angiography;  Surgeon: Burnell Blanks, MD;  Location: [facility];  Service: Cardiovascular;  Laterality: N/A;  . CARDIAC CATHETERIZATION N/A 12/06/2015   Procedure: Coronary Balloon Angioplasty;  Surgeon: Burnell Blanks, MD;  Location: [facility];  Service: Cardiovascular;  Laterality: N/A;  . CATARACT EXTRACTION    . CORONARY ARTERY BYPASS GRAFT    . EYE SURGERY    . HERNIA REPAIR  1985  . INTRAVASCULAR ULTRASOUND/IVUS N/A 10/15/2017   Procedure: Intravascular Ultrasound/IVUS;  Surgeon: Belva Crome, MD;  Location: [facility];  Service: Cardiovascular;  Laterality: N/A;  . KNEE ARTHROSCOPY  1999 and 2004  . LEFT HEART CATH AND CORS/GRAFTS ANGIOGRAPHY N/A 10/15/2017   Procedure: LEFT HEART CATH AND CORS/GRAFTS ANGIOGRAPHY;  Surgeon: Belva Crome, MD;  Location: [facility];  Service: Cardiovascular;  Laterality: N/A;  . LEFT HEART CATH AND CORS/GRAFTS ANGIOGRAPHY N/A 01/07/2020   Procedure: LEFT HEART CATH AND CORS/GRAFTS ANGIOGRAPHY;  Surgeon: Sherren Mocha, MD;  Location: [facility];  Service: Cardiovascular;  Laterality: N/A;  . LEFT HEART CATHETERIZATION WITH CORONARY ANGIOGRAM N/A 10/09/2011   Procedure: LEFT HEART CATHETERIZATION WITH CORONARY ANGIOGRAM;  Surgeon: Peter M Martinique, MD;  Location: [facility] C[facility];  Service: Cardiovascular;  Laterality: N/A;  . LEFT HEART CATHETERIZATION WITH CORONARY/GRAFT ANGIOGRAM N/A 06/06/2012   Procedure: LEFT HEART CATHETERIZATION WITH Beatrix Fetters;  Surgeon: Peter M Martinique, MD;  Location[facility];  Service: Cardiovascular;  Laterality: N/A;     Current Outpatient Medications  Medication Sig Dispense Refill  . acetaminophen (TYLENOL) 500 MG tablet Take 500-1,000 mg by mouth every 8 (eight) hours as needed for mild pain or headache.     Marland Kitchen aspirin 81 MG tablet Take 1  tablet (81 mg total)  by mouth daily.    . clopidogrel (PLAVIX) 75 MG tablet TAKE 1 TABLET BY MOUTH  DAILY 90 tablet 3  . enzalutamide (XTANDI) 40 MG capsule Take 160 mg by mouth at bedtime.    . ferrous sulfate 325 (65 FE) MG EC tablet Take 325-650 mg by mouth See admin instructions. Take 325 mg by mouth in the morning after breakfast and 650 mg after supper    . fluocinonide (LIDEX) 0.05 % external solution Apply 1 mL topically 2 (two) times daily as needed (dryness and itching of the scalp).   3  . hydrochlorothiazide (HYDRODIURIL) 25 MG tablet Take 1 tablet by mouth daily at 12 noon.    . hydrocortisone 2.5 % ointment Apply 1 application topically as directed.    . isosorbide mononitrate (IMDUR) 120 MG 24 hr tablet TAKE 1 TABLET BY MOUTH  DAILY 90 tablet 3  . levothyroxine (SYNTHROID, LEVOTHROID) 100 MCG tablet Take 100 mcg by mouth daily before breakfast.     . lisinopril (ZESTRIL) 10 MG tablet TAKE 1 TABLET BY MOUTH  DAILY 90 tablet 3  . metFORMIN (GLUCOPHAGE) 1000 MG tablet Take 500-1,000 mg by mouth See admin instructions. Take 1,000 mg by mouth in the morning before breakfast and 500 mg at bedtime    . metoprolol tartrate (LOPRESSOR) 25 MG tablet TAKE 1 TABLET BY MOUTH TWO  TIMES DAILY 180 tablet 3  . Multiple Vitamins-Minerals (PRESERVISION AREDS 2) CAPS Take 1 capsule by mouth 2 (two) times daily with a meal.    . ondansetron (ZOFRAN ODT) 4 MG disintegrating tablet Take 1 tablet (4 mg total) by mouth every 8 (eight) hours as needed. 10 tablet 0  . Oyster Shell Calcium 500 MG TABS Take 1 tablet by mouth daily at 12 noon.    . pantoprazole (PROTONIX) 40 MG tablet TAKE 1 TABLET BY MOUTH  TWICE DAILY 180 tablet 2  . Polyethyl Glycol-Propyl Glycol (SYSTANE FREE OP) Place 1 drop into both eyes every 8 (eight) hours as needed (for dry eyes).     . potassium chloride (KLOR-CON) 10 MEQ tablet TAKE 1 TABLET BY MOUTH AT  NIGHT 90 tablet 3  . rosuvastatin (CRESTOR) 20 MG tablet TAKE 1 TABLET BY MOUTH   DAILY 90 tablet 3  . tamsulosin (FLOMAX) 0.4 MG CAPS capsule Take 0.4 mg by mouth in the morning and at bedtime.     No current facility-administered medications for this visit.    Allergies:   Codeine    Social History:  The patient  reports that he quit smoking about 45 years ago. His smoking use included cigarettes. He has a 20.00 pack-year smoking history. He has never used smokeless tobacco. He reports that he does not drink alcohol and does not use drugs.   Family History:  The patient's family history includes Heart disease in his brother and brother; Hypertension in his mother; Lung cancer in his sister and son; Pneumonia in his brother; Prostate cancer in his brother, son, and another family member; Throat cancer in his sister; Unexplained death in his brother, brother, and mother.    ROS:  Please see the history of present illness.     Physical Exam: Blood pressure (!) 144/88, pulse 64, height 5\' 3"  (1.6 m), weight 162 lb 12.8 oz (73.8 kg), SpO2 100 %.  GEN:  Well nourished, well developed in no acute distress HEENT: Normal NECK: No JVD; No carotid bruits LYMPHATICS: No lymphadenopathy CARDIAC: RRR , no murmurs, rubs, gallops RESPIRATORY:  Clear  to auscultation without rales, wheezing or rhonchi  ABDOMEN: Soft, non-tender, non-distended MUSCULOSKELETAL:  No edema; No deformity  SKIN: Warm and dry NEUROLOGIC:  Alert and oriented x 3   EKG:      Recent Labs: 01/06/2020: ALT 13 01/07/2020: BUN 17; Creatinine, Ser 0.97; Hemoglobin 11.4; Platelets 167; Potassium 4.0; Sodium 143    Lipid Panel    Component Value Date/Time   CHOL 227 (H) 01/07/2020 0029   CHOL 81 (L) 07/03/2019 0729   TRIG 255 (H) 01/07/2020 0029   HDL 30 (L) 01/07/2020 0029   HDL 27 (L) 07/03/2019 0729   CHOLHDL 7.6 01/07/2020 0029   VLDL 51 (H) 01/07/2020 0029   LDLCALC 146 (H) 01/07/2020 0029   LDLCALC 33 07/03/2019 0729    Wt Readings from Last 3 Encounters:  08/02/20 162 lb 12.8 oz (73.8  kg)  02/18/20 160 lb (72.6 kg)  01/27/20 163 lb 6.4 oz (74.1 kg)      Other studies Reviewed: Additional studies/ records that were reviewed today include: . Review of the above records demonstrates:    ASSESSMENT AND PLAN:  1. Coronary artery disease:     No angina .   Is slowing down   2. Hypertension - BP is mildly elevated.  Advised him to limit his salty foods including potato chips.    3. Hypothyroidism - Managed by Dr. Arelia Sneddon   4. Hyperlipidemia -    He is having lots of muscle aches which she attributes to the rosuvastatin.  Will check lipids, liver enzymes, basic metabolic profile today.  I will refer him to the lipid clinic for consideration for PCSK9 inhibitor.  5. Diabetes mellitus-    Signed, Benjamin Moores, MD  08/02/2020 8:22 AM    [facility] [place], [place], [place]  [address] Phone: [phone]; Fax: [phone]

## 2020-08-02 ENCOUNTER — Other Ambulatory Visit: Payer: Self-pay

## 2020-08-02 ENCOUNTER — Encounter: Payer: Self-pay | Admitting: Cardiovascular Disease

## 2020-08-02 ENCOUNTER — Ambulatory Visit: Payer: Medicare Other | Admitting: Cardiovascular Disease

## 2020-08-02 VITALS — BP 144/88 | HR 64 | Ht 63.0 in | Wt 162.8 lb

## 2020-08-02 DIAGNOSIS — I10 Essential (primary) hypertension: Secondary | ICD-10-CM | POA: Diagnosis not present

## 2020-08-02 DIAGNOSIS — E782 Mixed hyperlipidemia: Secondary | ICD-10-CM | POA: Diagnosis not present

## 2020-08-02 DIAGNOSIS — Z79899 Other long term (current) drug therapy: Secondary | ICD-10-CM

## 2020-08-02 DIAGNOSIS — I25708 Atherosclerosis of coronary artery bypass graft(s), unspecified, with other forms of angina pectoris: Secondary | ICD-10-CM | POA: Diagnosis not present

## 2020-08-02 LAB — LIPID PANEL
Chol/HDL Ratio: 3.5 ratio (ref 0.0–5.0)
Cholesterol, Total: 145 mg/dL (ref 100–199)
HDL: 41 mg/dL (ref >39)
LDL Chol Calc (NIH): 74 mg/dL (ref 0–99)
Triglycerides: 175 mg/dL — ABNORMAL HIGH (ref 0–149)
VLDL Cholesterol Cal: 30 mg/dL (ref 5–40)

## 2020-08-02 LAB — BASIC METABOLIC PANEL
BUN/Creatinine Ratio: 13 (ref 10–24)
BUN: 12 mg/dL (ref 8–27)
CO2: 26 mmol/L (ref 20–29)
Calcium: 9.4 mg/dL (ref 8.6–10.2)
Chloride: 102 mmol/L (ref 96–106)
Creatinine, Ser: 0.91 mg/dL (ref 0.76–1.27)
GFR calc Af Amer: 90 mL/min/{1.73_m2} (ref >59)
GFR calc non Af Amer: 78 mL/min/{1.73_m2} (ref >59)
Glucose: 159 mg/dL — ABNORMAL HIGH (ref 65–99)
Potassium: 4.9 mmol/L (ref 3.5–5.2)
Sodium: 139 mmol/L (ref 134–144)

## 2020-08-02 LAB — HEPATIC FUNCTION PANEL
ALT: 7 IU/L (ref 0–44)
AST: 12 IU/L (ref 0–40)
Albumin: 4.1 g/dL (ref 3.6–4.6)
Alkaline Phosphatase: 91 IU/L (ref 44–121)
Bilirubin Total: 0.8 mg/dL (ref 0.0–1.2)
Bilirubin, Direct: 0.19 mg/dL (ref 0.00–0.40)
Total Protein: 6.6 g/dL (ref 6.0–8.5)

## 2020-08-02 NOTE — Patient Instructions (Addendum)
    Medication Instructions:  Your physician recommends that you continue on your current medications as directed. Please refer to the Current Medication list given to you today.  *If you need a refill on your cardiac medications before your next appointment, please call your pharmacy*   Lab Work: Lipid panel, Bmet, and liver function today  If you have labs (blood work) drawn today and your tests are completely normal, you will receive your results only by: Marland Kitchen MyChart Message (if you have MyChart) OR . A paper copy in the mail If you have any lab test that is abnormal or we need to change your treatment, we will call you to review the results.   Testing/Procedures: Your physician has requested that you have an ankle brachial index (ABI). During this test an ultrasound and blood pressure cuff are used to evaluate the arteries that supply the arms and legs with blood. Allow thirty minutes for this exam. There are no restrictions or special instructions.  Refer to Lipid Clinic    Follow-Up: At [facility], you and your health needs are our priority.  As part of our continuing mission to provide you with exceptional heart care, we have created designated Provider Care Teams.  These Care Teams include your primary Cardiologist (physician) and Advanced Practice Providers (APPs -  Physician Assistants and Nurse Practitioners) who all work together to provide you with the care you need, when you need it.  We recommend signing up for the patient portal called "MyChart".  Sign up information is provided on this After Visit Summary.  MyChart is used to connect with patients for Virtual Visits (Telemedicine).  Patients are able to view lab/test results, encounter notes, upcoming appointments, etc.  Non-urgent messages can be sent to your provider as well.   To learn more about what you can do with MyChart, go to [email or web address].    Your next appointment:   6 month(s)   The format for  your next appointment:   In Person  Provider:   Kristeen Miss, MD

## 2020-08-13 ENCOUNTER — Encounter: Payer: Self-pay | Admitting: Pharmacist

## 2020-08-13 ENCOUNTER — Ambulatory Visit (INDEPENDENT_AMBULATORY_CARE_PROVIDER_SITE_OTHER): Payer: Medicare Other | Admitting: Pharmacist

## 2020-08-13 ENCOUNTER — Other Ambulatory Visit: Payer: Self-pay

## 2020-08-13 DIAGNOSIS — I25708 Atherosclerosis of coronary artery bypass graft(s), unspecified, with other forms of angina pectoris: Secondary | ICD-10-CM | POA: Diagnosis not present

## 2020-08-13 DIAGNOSIS — E782 Mixed hyperlipidemia: Secondary | ICD-10-CM | POA: Diagnosis not present

## 2020-08-13 MED ORDER — EVOLOCUMAB 140 MG/ML ~~LOC~~ SOAJ: 1.0000 mL | SUBCUTANEOUS | 3 refills | Status: DC

## 2020-08-13 NOTE — Progress Notes (Signed)
Patient ID: Benjamin Harrison                 DOB: 22-Sep-1934                    MRN: [id number]     HPI: Benjamin Harrison is a 86 y.o. male patient referred to lipid clinic by Dr Acie Fredrickson. PMH is significant for CAD, HTN, hypothyroidism, HLD, prostate cancer and DM.  Seen by Dr Acie Fredrickson on 08/03/19 with leg pain which he attributed to rosuvastatin.  He self discontinued but was instructed to restart.  Patient presents today with his wife in good spirits.  Ambulates with a  Kasandra Knudsen and wife assists him.  Has pain in both of his feet which radiates through his ankles and up his leg.  Does not know if it is related to rosuvastatin because he only discontinued it for one day.    Current Medications: rosuvastatin 20mg  Previously tried: atorvastatin 40mg , Zetia 10mg  Risk Factors: Hx of NSTEMI, HTN, CAD, DM, post CABG LDL goal: <55  Labs: TC 145, HDL 41, Trigs 175, LDL 74 (08/02/20 on rosuvastatin 20mg )  Past Medical History:  Diagnosis Date  . AAA (abdominal aortic aneurysm) ([place])   . CHF (congestive heart failure) ([place])   . Coronary artery disease    a. CABG 2001 with early failure of VG-RCA. b. Inf MI after Lexiscan 01/28/2009 s/p RCA stent. c. Stent to dRCA 02/24/09. d. DES to [place]  2012. e. NSTEMI s/p angioplasty for ISR of RCA 09/2011. f. 05/2012 Cath: Med Rx; g. Cath 12/2019 -> med rx.  Marland Kitchen GERD (gastroesophageal reflux disease)   . History of echocardiogram    a. 09/2014 Echo: EF 55-60%, mild basal-midinferior HK, triv AI.  Marland Kitchen Hyperlipidemia   . Hypertension   . Hypertensive heart disease   . Hypothyroidism   . Myocardial infarction ([place])   . Osteoarthritis    a.End-stage osteoarthritis, right knee, medial compartment  . Personal history of tobacco use   . Prostate cancer ([place])   . Skin cancer    melanoma  . Type II diabetes mellitus ([place])     Current Outpatient Medications on File Prior to Visit  Medication Sig Dispense Refill  . acetaminophen (TYLENOL) 500 MG tablet Take 500-1,000 mg  by mouth every 8 (eight) hours as needed for mild pain or headache.     Marland Kitchen aspirin 81 MG tablet Take 1 tablet (81 mg total) by mouth daily.    . clopidogrel (PLAVIX) 75 MG tablet TAKE 1 TABLET BY MOUTH  DAILY 90 tablet 3  . enzalutamide (XTANDI) 40 MG capsule Take 160 mg by mouth at bedtime.    . ferrous sulfate 325 (65 FE) MG EC tablet Take 325-650 mg by mouth See admin instructions. Take 325 mg by mouth in the morning after breakfast and 650 mg after supper    . fluocinonide (LIDEX) 0.05 % external solution Apply 1 mL topically 2 (two) times daily as needed (dryness and itching of the scalp).   3  . hydrochlorothiazide (HYDRODIURIL) 25 MG tablet Take 1 tablet by mouth daily at 12 noon.    . hydrocortisone 2.5 % ointment Apply 1 application topically as directed.    . isosorbide mononitrate (IMDUR) 120 MG 24 hr tablet TAKE 1 TABLET BY MOUTH  DAILY 90 tablet 3  . levothyroxine (SYNTHROID, LEVOTHROID) 100 MCG tablet Take 100 mcg by mouth daily before breakfast.     . lisinopril (ZESTRIL) 10 MG  tablet TAKE 1 TABLET BY MOUTH  DAILY 90 tablet 3  . metFORMIN (GLUCOPHAGE) 1000 MG tablet Take 500-1,000 mg by mouth See admin instructions. Take 1,000 mg by mouth in the morning before breakfast and 500 mg at bedtime    . metoprolol tartrate (LOPRESSOR) 25 MG tablet TAKE 1 TABLET BY MOUTH TWO  TIMES DAILY 180 tablet 3  . Multiple Vitamins-Minerals (PRESERVISION AREDS 2) CAPS Take 1 capsule by mouth 2 (two) times daily with a meal.    . ondansetron (ZOFRAN ODT) 4 MG disintegrating tablet Take 1 tablet (4 mg total) by mouth every 8 (eight) hours as needed. 10 tablet 0  . Oyster Shell Calcium 500 MG TABS Take 1 tablet by mouth daily at 12 noon.    . pantoprazole (PROTONIX) 40 MG tablet TAKE 1 TABLET BY MOUTH  TWICE DAILY 180 tablet 2  . Polyethyl Glycol-Propyl Glycol (SYSTANE FREE OP) Place 1 drop into both eyes every 8 (eight) hours as needed (for dry eyes).     . potassium chloride (KLOR-CON) 10 MEQ tablet TAKE  1 TABLET BY MOUTH AT  NIGHT 90 tablet 3  . rosuvastatin (CRESTOR) 20 MG tablet TAKE 1 TABLET BY MOUTH  DAILY 90 tablet 3  . tamsulosin (FLOMAX) 0.4 MG CAPS capsule Take 0.4 mg by mouth in the morning and at bedtime.     No current facility-administered medications on file prior to visit.    Allergies  Allergen Reactions  . Codeine Nausea Only    Pt reports this happens off and on (??)    Assessment/Plan:  1. Hyperlipidemia - Patient LDL 74 which is above goal of <55.  Aggressive goal selected due to patient's history of MI and DM.  Recommended patient discontnue rosuvastatin for the time being to see if medication is causing muscle pain.  Will need further cholesterol lowering and patient is interested in PCSK9i.  Using demo pen,educated patient on storage, site selection and administration.  Patient's wife was able to use pen and believes they can do it at home.  Will complete PA as necessary and recheck lipid panel in 2-3 months.  Patient voiced understanding.  Start Repatha 140mg  sq q 14 d Stop rosuvastatin 20mg  daily Recheck lipid panel ~10/27/20  Karren Cobble, PharmD, BCACP, CDCES, [place] [address] [place], [place] [address] Phone: [phone]; Fax: [phone] 08/13/2020 9:53 AM

## 2020-08-13 NOTE — Patient Instructions (Addendum)
It was good meeting you today!  We would like your LDL (bad cholesterol) to be less than 55.  Since your rosuvastatin is causing muscle pains, I would like you to discontinue it for the time being  We will start a new medication that you will inject once every 2 weeks  We will recheck your cholesterol in 2-3 months  Please call with any questions!  Karren Cobble, PharmD, BCACP, [place], [place] [address], [place], [place] [address] Phone: [phone]; Fax: [phone] 08/13/2020 9:29 AM

## 2020-08-16 ENCOUNTER — Telehealth: Payer: Self-pay | Admitting: Pharmacist

## 2020-08-16 NOTE — Telephone Encounter (Signed)
Pt called clinic and left voicemail for Gerald Stabs about Repatha copay of $47 which is cost prohibitive for pt.

## 2020-08-17 MED ORDER — ROSUVASTATIN CALCIUM 20 MG PO TABS: 20.0000 mg | ORAL_TABLET | Freq: Every day | ORAL | 1 refills | Status: DC

## 2020-08-17 NOTE — Telephone Encounter (Signed)
Spoke with patient.  He is concerned that with insurance picking up >1000 dollars of cost he will be in the donut hole too early this year and then will not be able to afford his cancer medications.  Patient will continue on rosuvastatin 20mg  for now.  Recommended if he continues to have muscle pains to let us know and we can try an alternate statin.

## 2020-08-17 NOTE — Telephone Encounter (Signed)
Patient called again this AM and left VM for chris that his medication was too expensive and requested a call back

## 2020-08-24 ENCOUNTER — Ambulatory Visit (HOSPITAL_COMMUNITY)
Admission: RE | Admit: 2020-08-24 | Discharge: 2020-08-24 | Disposition: A | Discharge status: Home / Self Care | Payer: Medicare Other | Source: Ambulatory Visit | Attending: Cardiovascular Disease | Admitting: Cardiovascular Disease

## 2020-08-24 ENCOUNTER — Other Ambulatory Visit: Payer: Self-pay

## 2020-08-24 DIAGNOSIS — M79604 Pain in right leg: Secondary | ICD-10-CM

## 2020-08-24 DIAGNOSIS — Z79899 Other long term (current) drug therapy: Secondary | ICD-10-CM | POA: Diagnosis not present

## 2020-08-24 DIAGNOSIS — I10 Essential (primary) hypertension: Secondary | ICD-10-CM | POA: Insufficient documentation

## 2020-08-24 DIAGNOSIS — M79605 Pain in left leg: Secondary | ICD-10-CM | POA: Diagnosis not present

## 2020-08-24 DIAGNOSIS — E782 Mixed hyperlipidemia: Secondary | ICD-10-CM | POA: Diagnosis present

## 2020-09-17 ENCOUNTER — Other Ambulatory Visit: Payer: Self-pay | Admitting: Cardiovascular Disease

## 2020-10-27 ENCOUNTER — Other Ambulatory Visit: Payer: Self-pay

## 2020-10-27 ENCOUNTER — Other Ambulatory Visit: Payer: Medicare Other | Admitting: *Deleted

## 2020-10-27 DIAGNOSIS — Z79899 Other long term (current) drug therapy: Secondary | ICD-10-CM

## 2020-10-27 DIAGNOSIS — E782 Mixed hyperlipidemia: Secondary | ICD-10-CM

## 2020-10-27 DIAGNOSIS — I10 Essential (primary) hypertension: Secondary | ICD-10-CM

## 2020-10-27 LAB — BASIC METABOLIC PANEL
BUN/Creatinine Ratio: 16 (ref 10–24)
BUN: 20 mg/dL (ref 8–27)
CO2: 22 mmol/L (ref 20–29)
Calcium: 9 mg/dL (ref 8.6–10.2)
Chloride: 98 mmol/L (ref 96–106)
Creatinine, Ser: 1.26 mg/dL (ref 0.76–1.27)
Glucose: 166 mg/dL — ABNORMAL HIGH (ref 65–99)
Potassium: 4.1 mmol/L (ref 3.5–5.2)
Sodium: 138 mmol/L (ref 134–144)
eGFR: 57 mL/min/{1.73_m2} — ABNORMAL LOW (ref >59)

## 2020-10-27 LAB — HEPATIC FUNCTION PANEL
ALT: 8 IU/L (ref 0–44)
AST: 19 IU/L (ref 0–40)
Albumin: 4 g/dL (ref 3.6–4.6)
Alkaline Phosphatase: 161 IU/L — ABNORMAL HIGH (ref 44–121)
Bilirubin Total: 1.1 mg/dL (ref 0.0–1.2)
Bilirubin, Direct: 0.27 mg/dL (ref 0.00–0.40)
Total Protein: 7 g/dL (ref 6.0–8.5)

## 2020-10-27 LAB — LIPID PANEL
Chol/HDL Ratio: 4.6 ratio (ref 0.0–5.0)
Cholesterol, Total: 169 mg/dL (ref 100–199)
HDL: 37 mg/dL — ABNORMAL LOW (ref >39)
LDL Chol Calc (NIH): 83 mg/dL (ref 0–99)
Triglycerides: 295 mg/dL — ABNORMAL HIGH (ref 0–149)
VLDL Cholesterol Cal: 49 mg/dL — ABNORMAL HIGH (ref 5–40)

## 2020-11-01 ENCOUNTER — Telehealth: Payer: Self-pay | Admitting: Cardiovascular Disease

## 2020-11-01 NOTE — Telephone Encounter (Signed)
Pt called back returning [facility] call about his labs   Best number is [phone]

## 2020-11-03 NOTE — Telephone Encounter (Signed)
lmtcb 4/6

## 2020-11-16 ENCOUNTER — Ambulatory Visit
Admission: RE | Admit: 2020-11-16 | Discharge: 2020-11-16 | Disposition: A | Discharge status: Home / Self Care | Payer: Medicare Other | Source: Ambulatory Visit | Attending: Family Medicine | Admitting: Family Medicine

## 2020-11-16 ENCOUNTER — Other Ambulatory Visit: Payer: Self-pay

## 2020-11-16 ENCOUNTER — Other Ambulatory Visit: Payer: Self-pay | Admitting: Family Medicine

## 2020-11-16 ENCOUNTER — Emergency Department (HOSPITAL_COMMUNITY): Payer: Medicare Other

## 2020-11-16 ENCOUNTER — Emergency Department (HOSPITAL_COMMUNITY)
Admission: EM | Admit: 2020-11-16 | Discharge: 2020-11-16 | Disposition: A | Discharge status: Home / Self Care | Payer: Medicare Other | Attending: Emergency Medicine | Admitting: Emergency Medicine

## 2020-11-16 DIAGNOSIS — Z79899 Other long term (current) drug therapy: Secondary | ICD-10-CM | POA: Insufficient documentation

## 2020-11-16 DIAGNOSIS — Z20822 Contact with and (suspected) exposure to covid-19: Secondary | ICD-10-CM | POA: Insufficient documentation

## 2020-11-16 DIAGNOSIS — N183 Chronic kidney disease, stage 3 unspecified: Secondary | ICD-10-CM | POA: Diagnosis not present

## 2020-11-16 DIAGNOSIS — I2510 Atherosclerotic heart disease of native coronary artery without angina pectoris: Secondary | ICD-10-CM | POA: Insufficient documentation

## 2020-11-16 DIAGNOSIS — I130 Hypertensive heart and chronic kidney disease with heart failure and stage 1 through stage 4 chronic kidney disease, or unspecified chronic kidney disease: Secondary | ICD-10-CM | POA: Insufficient documentation

## 2020-11-16 DIAGNOSIS — Z7982 Long term (current) use of aspirin: Secondary | ICD-10-CM | POA: Insufficient documentation

## 2020-11-16 DIAGNOSIS — R059 Cough, unspecified: Secondary | ICD-10-CM | POA: Diagnosis present

## 2020-11-16 DIAGNOSIS — E1122 Type 2 diabetes mellitus with diabetic chronic kidney disease: Secondary | ICD-10-CM | POA: Diagnosis not present

## 2020-11-16 DIAGNOSIS — Z7984 Long term (current) use of oral hypoglycemic drugs: Secondary | ICD-10-CM | POA: Diagnosis not present

## 2020-11-16 DIAGNOSIS — J101 Influenza due to other identified influenza virus with other respiratory manifestations: Secondary | ICD-10-CM

## 2020-11-16 DIAGNOSIS — Z85828 Personal history of other malignant neoplasm of skin: Secondary | ICD-10-CM | POA: Diagnosis not present

## 2020-11-16 DIAGNOSIS — R4182 Altered mental status, unspecified: Secondary | ICD-10-CM | POA: Diagnosis not present

## 2020-11-16 DIAGNOSIS — R0602 Shortness of breath: Secondary | ICD-10-CM

## 2020-11-16 DIAGNOSIS — Z951 Presence of aortocoronary bypass graft: Secondary | ICD-10-CM | POA: Diagnosis not present

## 2020-11-16 DIAGNOSIS — I509 Heart failure, unspecified: Secondary | ICD-10-CM | POA: Insufficient documentation

## 2020-11-16 DIAGNOSIS — E039 Hypothyroidism, unspecified: Secondary | ICD-10-CM | POA: Diagnosis not present

## 2020-11-16 DIAGNOSIS — Z8546 Personal history of malignant neoplasm of prostate: Secondary | ICD-10-CM | POA: Diagnosis not present

## 2020-11-16 DIAGNOSIS — I252 Old myocardial infarction: Secondary | ICD-10-CM | POA: Diagnosis not present

## 2020-11-16 DIAGNOSIS — Z87891 Personal history of nicotine dependence: Secondary | ICD-10-CM | POA: Diagnosis not present

## 2020-11-16 DIAGNOSIS — J09X2 Influenza due to identified novel influenza A virus with other respiratory manifestations: Secondary | ICD-10-CM | POA: Insufficient documentation

## 2020-11-16 LAB — COMPREHENSIVE METABOLIC PANEL
ALT: 10 U/L (ref 0–44)
AST: 17 U/L (ref 15–41)
Albumin: 2.8 g/dL — ABNORMAL LOW (ref 3.5–5.0)
Alkaline Phosphatase: 114 U/L (ref 38–126)
Anion gap: 9 (ref 5–15)
BUN: 15 mg/dL (ref 8–23)
CO2: 26 mmol/L (ref 22–32)
Calcium: 9 mg/dL (ref 8.9–10.3)
Chloride: 99 mmol/L (ref 98–111)
Creatinine, Ser: 1.11 mg/dL (ref 0.61–1.24)
GFR, Estimated: >60 mL/min (ref >60)
Glucose, Bld: 141 mg/dL — ABNORMAL HIGH (ref 70–99)
Potassium: 3.6 mmol/L (ref 3.5–5.1)
Sodium: 134 mmol/L — ABNORMAL LOW (ref 135–145)
Total Bilirubin: 1.1 mg/dL (ref 0.3–1.2)
Total Protein: 6.2 g/dL — ABNORMAL LOW (ref 6.5–8.1)

## 2020-11-16 LAB — CBC
HCT: 32.7 % — ABNORMAL LOW (ref 39.0–52.0)
Hemoglobin: 10.7 g/dL — ABNORMAL LOW (ref 13.0–17.0)
MCH: 26.6 pg (ref 26.0–34.0)
MCHC: 32.7 g/dL (ref 30.0–36.0)
MCV: 81.3 fL (ref 80.0–100.0)
Platelets: 230 10*3/uL (ref 150–400)
RBC: 4.02 MIL/uL — ABNORMAL LOW (ref 4.22–5.81)
RDW: 16.7 % — ABNORMAL HIGH (ref 11.5–15.5)
WBC: 4.8 10*3/uL (ref 4.0–10.5)
nRBC: 0 % (ref 0.0–0.2)

## 2020-11-16 LAB — URINALYSIS, ROUTINE W REFLEX MICROSCOPIC
Bacteria, UA: NONE SEEN
Bilirubin Urine: NEGATIVE
Glucose, UA: NEGATIVE mg/dL
Hgb urine dipstick: NEGATIVE
Ketones, ur: NEGATIVE mg/dL
Leukocytes,Ua: NEGATIVE
Nitrite: NEGATIVE
Protein, ur: 100 mg/dL — AB
Specific Gravity, Urine: 1.016 (ref 1.005–1.030)
pH: 5 (ref 5.0–8.0)

## 2020-11-16 LAB — RAPID URINE DRUG SCREEN, HOSP PERFORMED
Amphetamines: NOT DETECTED
Barbiturates: NOT DETECTED
Benzodiazepines: NOT DETECTED
Cocaine: NOT DETECTED
Opiates: NOT DETECTED
Tetrahydrocannabinol: NOT DETECTED

## 2020-11-16 LAB — APTT: aPTT: 30 seconds (ref 24–36)

## 2020-11-16 LAB — DIFFERENTIAL
Abs Immature Granulocytes: 0.07 10*3/uL (ref 0.00–0.07)
Basophils Absolute: 0 10*3/uL (ref 0.0–0.1)
Basophils Relative: 0 %
Eosinophils Absolute: 0.2 10*3/uL (ref 0.0–0.5)
Eosinophils Relative: 4 %
Immature Granulocytes: 1 %
Lymphocytes Relative: 18 %
Lymphs Abs: 0.9 10*3/uL (ref 0.7–4.0)
Monocytes Absolute: 0.6 10*3/uL (ref 0.1–1.0)
Monocytes Relative: 12 %
Neutro Abs: 3.1 10*3/uL (ref 1.7–7.7)
Neutrophils Relative %: 65 %

## 2020-11-16 LAB — PROTIME-INR
INR: 1.1 (ref 0.8–1.2)
Prothrombin Time: 14.1 seconds (ref 11.4–15.2)

## 2020-11-16 LAB — ETHANOL: Alcohol, Ethyl (B): <10 mg/dL (ref <10)

## 2020-11-16 LAB — RESP PANEL BY RT-PCR (FLU A&B, COVID) ARPGX2
Influenza A by PCR: POSITIVE — AB
Influenza B by PCR: NEGATIVE
SARS Coronavirus 2 by RT PCR: NEGATIVE

## 2020-11-16 NOTE — ED Triage Notes (Signed)
Pt comes from home via EMS. Last know normal 1200 today, reports he was feeling badly around 1230 and to lay down. When he awoke around 1730 family reports pt altered, asking repetitive questions, weakness. Pt oriented to person only and requires constant reorienting to situation, I able to follow commands. No speech deficits, VSS, Stroke scale neg with EMS. Hx of prostrate cancer with mets to bone, skin, and lung. 18g IV established in LAC. BP 166/72 HR 66 O2 96% RA CBG 152 RR 18

## 2020-11-16 NOTE — Discharge Instructions (Addendum)
Take over-the-counter medications as needed for body aches and fatigue.  Make sure to drink plenty of fluids and rest.  Follow-up with your doctor to be rechecked.  Return as needed for worsening symptoms

## 2020-11-16 NOTE — ED Notes (Addendum)
Pt ambulated in hallway well with cane. Standby assistance given. Pt had steady gait. No reports of light headedness, pain, or headache.

## 2020-11-16 NOTE — ED Notes (Signed)
Patient transported to CT 

## 2020-11-16 NOTE — ED Notes (Signed)
Pt given crackers and a drink per Dr. Tomi Bamberger.

## 2020-11-16 NOTE — ED Provider Notes (Signed)
[facility] EMERGENCY DEPARTMENT Provider Note   CSN: [id number] Arrival date & time: 11/16/20  1855     History Chief Complaint  Patient presents with  . Altered Mental Status    Benjamin Harrison is a 86 y.o. male.  HPI   Pt was feeling poorly today and slept all afternoon.  When he woke up he appeared confused and didn't know what was going on.  Yesterday he was in his usual health which overall is poor but nothing acutely changed.  NO fever today.  He has been coughing.  No complaints of pain right now but he has been having pain in his intermittently.  Pt does have stage 4 prostate cancer.  Pt is on oral chemo  Past Medical History:  Diagnosis Date  . AAA (abdominal aortic aneurysm) ([place])   . CHF (congestive heart failure) ([place])   . Coronary artery disease    a. CABG 2001 with early failure of VG-RCA. b. Inf MI after Lexiscan 01/28/2009 s/p RCA stent. c. Stent to dRCA 02/24/09. d. DES to [place]  2012. e. NSTEMI s/p angioplasty for ISR of RCA 09/2011. f. 05/2012 Cath: Med Rx; g. Cath 12/2019 -> med rx.  Marland Kitchen GERD (gastroesophageal reflux disease)   . History of echocardiogram    a. 09/2014 Echo: EF 55-60%, mild basal-midinferior HK, triv AI.  Marland Kitchen Hyperlipidemia   . Hypertension   . Hypertensive heart disease   . Hypothyroidism   . Myocardial infarction ([place])   . Osteoarthritis    a.End-stage osteoarthritis, right knee, medial compartment  . Personal history of tobacco use   . Prostate cancer ([place])   . Skin cancer    melanoma  . Type II diabetes mellitus [facility])     Patient Active Problem List   Diagnosis Date Noted  . Viral gastroenteritis 06/07/2018  . Hypoglycemia 06/06/2018  . AKI (acute kidney injury) ([place]) 06/06/2018  . Chronic anemia 06/06/2018  . CKD (chronic kidney disease) stage 3, GFR 30-59 ml/min (HCC) 06/06/2018  . Hypokalemia 06/06/2018  . BPH (benign prostatic hyperplasia) 06/06/2018  . Malignant neoplasm of prostate ([place]) 06/04/2018  .  Intractable nausea and vomiting 01/13/2018  . S/P PTCA (percutaneous transluminal coronary angioplasty) 11/04/2017  . Angina pectoris ([place]) 10/15/2017  . Abdominal aortic aneurysm (AAA) without rupture ([place]) 08/10/2016  . Acute on chronic kidney failure-II 08/09/2016  . GERD (gastroesophageal reflux disease) 08/08/2016  . Gastroenteritis 08/08/2016  . Sepsis ([place]) 08/08/2016  . Cough 08/08/2016  . Hypertensive heart disease   . Type II diabetes mellitus ([place])   . Thrombocytopenia ([place]) 06/08/2012  . NSTEMI (non-ST elevated myocardial infarction) ([place]) 10/09/2011  . Unstable angina ([place]) 10/08/2011  . Colitis 07/06/2011  . Diabetes mellitus ([place]) 07/05/2011  . Diarrhea 07/05/2011  . Weakness generalized 07/05/2011  . Fatigue 11/11/2010  . CAD (coronary artery disease)   . Hypertension   . Hypothyroidism   . Hyperlipidemia   . SOB (shortness of breath) on exertion   . Personal history of tobacco use     Past Surgical History:  Procedure Laterality Date  . CARDIAC CATHETERIZATION    . CARDIAC CATHETERIZATION N/A 12/06/2015   Procedure: Left Heart Cath and Cors/Grafts Angiography;  Surgeon: Burnell Blanks, MD;  Location: [place] CV LAB;  Service: Cardiovascular;  Laterality: N/A;  . CARDIAC CATHETERIZATION N/A 12/06/2015   Procedure: Coronary Balloon Angioplasty;  Surgeon: Burnell Blanks, MD;  Location: [place] CV LAB;  Service: Cardiovascular;  Laterality: N/A;  .  CATARACT EXTRACTION    . CORONARY ARTERY BYPASS GRAFT    . EYE SURGERY    . HERNIA REPAIR  1985  . INTRAVASCULAR ULTRASOUND/IVUS N/A 10/15/2017   Procedure: Intravascular Ultrasound/IVUS;  Surgeon: Belva Crome, MD;  Location: [place] CV LAB;  Service: Cardiovascular;  Laterality: N/A;  . KNEE ARTHROSCOPY  1999 and 2004  . LEFT HEART CATH AND CORS/GRAFTS ANGIOGRAPHY N/A 10/15/2017   Procedure: LEFT HEART CATH AND CORS/GRAFTS ANGIOGRAPHY;  Surgeon: Belva Crome, MD;  Location: [place] CV  LAB;  Service: Cardiovascular;  Laterality: N/A;  . LEFT HEART CATH AND CORS/GRAFTS ANGIOGRAPHY N/A 01/07/2020   Procedure: LEFT HEART CATH AND CORS/GRAFTS ANGIOGRAPHY;  Surgeon: Sherren Mocha, MD;  Location: [place] CV LAB;  Service: Cardiovascular;  Laterality: N/A;  . LEFT HEART CATHETERIZATION WITH CORONARY ANGIOGRAM N/A 10/09/2011   Procedure: LEFT HEART CATHETERIZATION WITH CORONARY ANGIOGRAM;  Surgeon: Peter M Martinique, MD;  Location: [facility] CATH LAB;  Service: Cardiovascular;  Laterality: N/A;  . LEFT HEART CATHETERIZATION WITH CORONARY/GRAFT ANGIOGRAM N/A 06/06/2012   Procedure: LEFT HEART CATHETERIZATION WITH Beatrix Fetters;  Surgeon: Peter M Martinique, MD;  Location: [facility] CATH LAB;  Service: Cardiovascular;  Laterality: N/A;       Family History  Problem Relation Age of Onset  . Hypertension Mother   . Unexplained death Mother   . Throat cancer Sister   . Lung cancer Sister   . Heart disease Brother   . Pneumonia Brother   . Heart disease Brother   . Prostate cancer Brother   . Unexplained death Brother        at birth  . Unexplained death Brother   . Prostate cancer Son   . Lung cancer Son   . Prostate cancer Other     Social History   Tobacco Use  . Smoking status: Former Smoker    Packs/day: 1.00    Years: 20.00    Pack years: 20.00    Types: Cigarettes    Quit date: 08/01/1975    Years since quitting: 45.3  . Smokeless tobacco: Never Used  Vaping Use  . Vaping Use: Never used  Substance Use Topics  . Alcohol use: No  . Drug use: No    Home Medications Prior to Admission medications   Medication Sig Start Date End Date Taking? Authorizing Provider  acetaminophen (TYLENOL) 500 MG tablet Take 500-1,000 mg by mouth every 8 (eight) hours as needed for mild pain or headache.     [provider]  aspirin 81 MG tablet Take 1 tablet (81 mg total) by mouth daily. 10/10/11   [name], NP  clopidogrel (PLAVIX) 75 MG tablet TAKE 1 TABLET  BY MOUTH  DAILY 03/15/20   [name], [name], MD  enzalutamide Gillermina Phy) 40 MG capsule Take 160 mg by mouth at bedtime.    [provider]  ferrous sulfate 325 (65 FE) MG EC tablet Take 325-650 mg by mouth See admin instructions. Take 325 mg by mouth in the morning after breakfast and 650 mg after supper    [provider]  fluocinonide (LIDEX) 0.05 % external solution Apply 1 mL topically 2 (two) times daily as needed (dryness and itching of the scalp).  12/24/17   [provider]  hydrochlorothiazide (HYDRODIURIL) 25 MG tablet Take 1 tablet by mouth daily at 12 noon.    [provider]  hydrocortisone 2.5 % ointment Apply 1 application topically as directed. 03/23/20   [provider]  isosorbide mononitrate (IMDUR) 120 MG 24 hr tablet TAKE 1 TABLET BY MOUTH  DAILY 11/11/19   [name], [name], MD  levothyroxine (SYNTHROID, LEVOTHROID) 100 MCG tablet Take 100 mcg by mouth daily before breakfast.     [provider]  lisinopril (ZESTRIL) 10 MG tablet TAKE 1 TABLET BY MOUTH  DAILY 02/10/20   [name], [name], MD  metFORMIN (GLUCOPHAGE) 1000 MG tablet Take 500-1,000 mg by mouth See admin instructions. Take 1,000 mg by mouth in the morning before breakfast and 500 mg at bedtime    [provider]  metoprolol tartrate (LOPRESSOR) 25 MG tablet TAKE 1 TABLET BY MOUTH  TWICE DAILY 09/17/20   [name], [name], MD  Multiple Vitamins-Minerals (PRESERVISION AREDS 2) CAPS Take 1 capsule by mouth 2 (two) times daily with a meal.    [provider]  ondansetron (ZOFRAN ODT) 4 MG disintegrating tablet Take 1 tablet (4 mg total) by mouth every 8 (eight) hours as needed. 02/07/18   [name], MD  [place] Calcium 500 MG TABS Take 1 tablet by mouth daily at 12 noon.    [provider]  pantoprazole (PROTONIX) 40 MG tablet TAKE 1 TABLET BY MOUTH  TWICE DAILY 09/08/19   [name], [name], MD  Polyethyl Glycol-Propyl Glycol (SYSTANE FREE OP)  Place 1 drop into both eyes every 8 (eight) hours as needed (for dry eyes).     [provider]  potassium chloride (KLOR-CON) 10 MEQ tablet TAKE 1 TABLET BY MOUTH AT  NIGHT 11/11/19   [name], [name], MD  rosuvastatin (CRESTOR) 20 MG tablet Take 1 tablet (20 mg total) by mouth daily. 08/17/20   [name], [name], MD  tamsulosin (FLOMAX) 0.4 MG CAPS capsule Take 0.4 mg by mouth in the morning and at bedtime.    [provider]    Allergies    Codeine  Review of Systems   Review of Systems  Constitutional: Negative for fever.  Respiratory: Negative for shortness of breath.   Neurological: Negative for headaches.  All other systems reviewed and are negative.   Physical Exam Updated Vital Signs BP (!) 162/87   Pulse 61   Temp 97.8 F (36.6 C) (Oral)   Resp 17   SpO2 95%   Physical Exam Vitals and nursing note reviewed.  Constitutional:      Appearance: He is well-developed. He is ill-appearing. He is not diaphoretic.  HENT:     Head: Normocephalic and atraumatic.     Right Ear: External ear normal.     Left Ear: External ear normal.  Eyes:     General: No scleral icterus.       Right eye: No discharge.        Left eye: No discharge.     Conjunctiva/sclera: Conjunctivae normal.  Neck:     Trachea: No tracheal deviation.  Cardiovascular:     Rate and Rhythm: Normal rate and regular rhythm.  Pulmonary:     Effort: Pulmonary effort is normal. No respiratory distress.     Breath sounds: Normal breath sounds. No stridor. No wheezing or rales.  Abdominal:     General: Bowel sounds are normal. There is no distension.     Palpations: Abdomen is soft.     Tenderness: There is no abdominal tenderness. There is no guarding or rebound.  Musculoskeletal:        General: No tenderness.     Cervical back: Neck supple.  Skin:    General: Skin  is warm and dry.     Findings: No rash.  Neurological:     Cranial Nerves: No cranial nerve deficit (no facial droop,  extraocular movements intact, no slurred speech).     Sensory: No sensory deficit.     Motor: Weakness present. No abnormal muscle tone or seizure activity.     Comments: Patient is oriented to person and place, he thinks the year is 2002, patient does follow commands, does appear listless weakness bilateral upper extremities and lower extremities, is able to lift both his arms and legs off the bed although with increased effort     ED Results / Procedures / Treatments   Labs (all labs ordered are listed, but only abnormal results are displayed) Labs Reviewed  RESP PANEL BY RT-PCR (FLU A&B, COVID) ARPGX2 - Abnormal; Notable for the following components:      Result Value   Influenza A by PCR POSITIVE (*)    All other components within normal limits  CBC - Abnormal; Notable for the following components:   RBC 4.02 (*)    Hemoglobin 10.7 (*)    HCT 32.7 (*)    RDW 16.7 (*)    All other components within normal limits  COMPREHENSIVE METABOLIC PANEL - Abnormal; Notable for the following components:   Sodium 134 (*)    Glucose, Bld 141 (*)    Total Protein 6.2 (*)    Albumin 2.8 (*)    All other components within normal limits  URINALYSIS, ROUTINE W REFLEX MICROSCOPIC - Abnormal; Notable for the following components:   APPearance HAZY (*)    Protein, ur 100 (*)    All other components within normal limits  ETHANOL  PROTIME-INR  APTT  DIFFERENTIAL  RAPID URINE DRUG SCREEN, HOSP PERFORMED    EKG EKG Interpretation  Date/Time:  Tuesday November 16 2020 19:03:56 EDT Ventricular Rate:  64 PR Interval:    QRS Duration: 102 QT Interval:  448 QTC Calculation: 463 R Axis:   6 Text Interpretation: poor data quality, probable sinus rhythm Low voltage, precordial leads Consider anterior infarct Confirmed by Dorie Rank [phone]) on 11/16/2020 7:18:39 PM   Radiology DG Chest 2 View  Result Date: 11/16/2020 CLINICAL DATA:  Cough, shortness of breath EXAM: CHEST - 2 VIEW COMPARISON:   01/06/2020 FINDINGS: Prior CABG. Heart is normal size. Lungs are clear. There are old right rib fractures with callus formation noted. No effusions or pneumothorax. No acute bony abnormality. IMPRESSION: Old right rib fractures laterally with callus formation. No acute cardiopulmonary disease. Electronically Signed   By: Rolm Baptise M.D.   On: 11/16/2020 20:26   CT HEAD WO CONTRAST  Result Date: 11/16/2020 CLINICAL DATA:  Altered mental status EXAM: CT HEAD WITHOUT CONTRAST TECHNIQUE: Contiguous axial images were obtained from the base of the skull through the vertex without intravenous contrast. COMPARISON:  None. FINDINGS: Brain: No evidence of acute infarction, hemorrhage, hydrocephalus, extra-axial collection or mass lesion/mass effect. Mild atrophic changes and chronic white matter ischemic changes are seen. Vascular: No hyperdense vessel or unexpected calcification. Skull: Normal. Negative for fracture or focal lesion. Sinuses/Orbits: No acute finding. Other: Mild scalp swelling is noted in the left posterior parietal region near the vertex which may be related to recent injury. Correlate with physical exam. IMPRESSION: Chronic atrophic and ischemic changes. No acute intracranial abnormality noted. Scalp swelling in the left posterior parietal region near the vertex. This may be related to prior injury. Correlate with the physical exam. Electronically Signed  By: Inez Catalina M.D.   On: 11/16/2020 21:00   DG Chest Portable 1 View  Result Date: 11/16/2020 CLINICAL DATA:  Weakness EXAM: PORTABLE CHEST 1 VIEW COMPARISON:  11/16/2020 FINDINGS: Prior CABG. Lingular scarring. Right lung clear. Heart is normal size. No effusions or acute bony abnormality. IMPRESSION: Lingular scarring.  No active disease. Electronically Signed   By: Rolm Baptise M.D.   On: 11/16/2020 20:22    Procedures Procedures   Medications Ordered in ED Medications - No data to display  ED Course  I have reviewed the triage  vital signs and the nursing notes.  Pertinent labs & imaging results that were available during my care of the patient were reviewed by me and considered in my medical decision making (see chart for details).  Clinical Course as of 11/16/20 2259  Tue Nov 16, 2020  2124 CBC unremarkable.  Hemoglobin slightly decreased from previous [JK]  2124 UA does not suggest UTI [JK]    Clinical Course User Index [JK] Dorie Rank, MD   MDM Rules/Calculators/A&P                          Patient presented to the ED for evaluation of generalized malaise and fatigue.  Wife is concerned that he is also somewhat confused.  Patient does have history of multiple medical problems including metastatic prostate CA.  In the ED the patient was alert and awake.  He did have some confusion regarding the dates but otherwise was alert and oriented.  Patient was responding to commands appropriately.  He had no focal deficits to suggest stroke.  No neck pain or stiffness.  Doubt meningitis or encephalitis.  Patient's laboratory tests are otherwise reassuring with the exception of a positive influenza test.  I suspect this causes generalized malaise today.  Patient is otherwise breathing easily.  He is afebrile.  He is able to walk around the ED without difficulty.  He and his wife are comfortable going home at this time. Final Clinical Impression(s) / ED Diagnoses Final diagnoses:  Influenza A    Rx / DC Orders ED Discharge Orders    None       Dorie Rank, MD 11/16/20 2308

## 2020-12-18 ENCOUNTER — Other Ambulatory Visit: Payer: Self-pay | Admitting: Cardiovascular Disease

## 2021-02-03 ENCOUNTER — Ambulatory Visit
Admission: RE | Admit: 2021-02-03 | Discharge: 2021-02-03 | Disposition: A | Discharge status: Home / Self Care | Payer: Medicare Other | Source: Ambulatory Visit | Attending: Family Medicine | Admitting: Family Medicine

## 2021-02-03 ENCOUNTER — Other Ambulatory Visit: Payer: Self-pay | Admitting: Family Medicine

## 2021-02-03 DIAGNOSIS — C61 Malignant neoplasm of prostate: Secondary | ICD-10-CM

## 2021-02-03 DIAGNOSIS — M5418 Radiculopathy, sacral and sacrococcygeal region: Secondary | ICD-10-CM

## 2021-02-08 ENCOUNTER — Other Ambulatory Visit (HOSPITAL_COMMUNITY): Payer: Self-pay | Admitting: Family Medicine

## 2021-02-09 ENCOUNTER — Other Ambulatory Visit: Payer: Medicare Other

## 2021-02-11 ENCOUNTER — Other Ambulatory Visit (HOSPITAL_COMMUNITY): Payer: Self-pay | Admitting: Family Medicine

## 2021-02-11 ENCOUNTER — Ambulatory Visit: Payer: Medicare Other | Admitting: Cardiovascular Disease

## 2021-02-11 ENCOUNTER — Other Ambulatory Visit: Payer: Self-pay | Admitting: Family Medicine

## 2021-02-11 DIAGNOSIS — C7951 Secondary malignant neoplasm of bone: Secondary | ICD-10-CM

## 2021-02-18 ENCOUNTER — Encounter (HOSPITAL_COMMUNITY): Payer: Medicare Other

## 2021-02-25 ENCOUNTER — Encounter (HOSPITAL_COMMUNITY)
Admission: RE | Admit: 2021-02-25 | Discharge: 2021-02-25 | Disposition: A | Discharge status: Home / Self Care | Payer: Medicare Other | Source: Ambulatory Visit | Attending: Family Medicine | Admitting: Family Medicine

## 2021-02-25 ENCOUNTER — Other Ambulatory Visit: Payer: Self-pay

## 2021-02-25 DIAGNOSIS — C7951 Secondary malignant neoplasm of bone: Secondary | ICD-10-CM | POA: Diagnosis present

## 2021-02-25 DIAGNOSIS — G893 Neoplasm related pain (acute) (chronic): Secondary | ICD-10-CM | POA: Diagnosis present

## 2021-02-25 MED ORDER — TECHNETIUM TC 99M MEDRONATE IV KIT
20.0000 | PACK | Freq: Once | INTRAVENOUS | Status: AC | PRN
  Administered 2021-02-25: 20.9 via INTRAVENOUS

## 2021-03-06 ENCOUNTER — Other Ambulatory Visit: Payer: Self-pay | Admitting: Cardiovascular Disease

## 2021-03-15 ENCOUNTER — Encounter: Payer: Self-pay | Admitting: Urology

## 2021-03-15 NOTE — Progress Notes (Signed)
Patient's wife (Mrs. Talk) reports pain 7/10, poor energy, dysuria, nocturia x2, frequency, urgency, a weak urine stream, mild diarrhea and constipation. No hematuria or skin changes.  I-PSS score of 15.  Meaningful use questions complete and patient's wife notified of patients 10:00am telephone appointment on 03/18/21 and expressed an understanding of it.

## 2021-03-18 ENCOUNTER — Other Ambulatory Visit: Payer: Self-pay

## 2021-03-18 ENCOUNTER — Ambulatory Visit
Admission: RE | Admit: 2021-03-18 | Discharge: 2021-03-18 | Disposition: A | Discharge status: Home / Self Care | Payer: Medicare Other | Source: Ambulatory Visit | Attending: Urology | Admitting: Urology

## 2021-03-18 DIAGNOSIS — C61 Malignant neoplasm of prostate: Secondary | ICD-10-CM | POA: Insufficient documentation

## 2021-03-18 DIAGNOSIS — C7951 Secondary malignant neoplasm of bone: Secondary | ICD-10-CM

## 2021-03-18 NOTE — Progress Notes (Signed)
Radiation Oncology         [phone] ________________________________  Outpatient Consultation - Conducted via telephone due to current COVID-19 concerns for limiting patient exposure  Name: Benjamin Harrison MRN: [id number]  Date: 03/18/2021  DOB: Oct 03, 1934  VC:[id number], Curt Jews, MD  Alexis Frock, MD   REFERRING PHYSICIAN: Alexis Frock, MD  DIAGNOSIS: 86 y.o. 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    ICD-10-CM   1. Malignant neoplasm of prostate metastatic to bone [facility])  C61 Ambulatory referral to Hematology / Oncology   C79.51 NM PET (PSMA) SKULL TO MID THIGH       HISTORY OF PRESENT ILLNESS: Benjamin Harrison is a 86 y.o. male with a diagnosis of progressive, metastatic castrate resistant prostate cancer. He was initially diagnosed with Gleason 4+3 adenocarcinoma of the prostate with TRUSPBx on 04/10/2018 involving all 12 cores. Perineural invasion was identified in all cores except the left base lateral and extracapsular extension was identified in left mid lateral, left base, right base lateral, and right base.  A CT A/P performed in July 2019 for evaluation of abdominal pain with suspected nephrolithiasis was negative for any lymphadenopathy or evidence of metastatic disease in the abdomen or pelvis.  He underwent PET scan on 04/04/2018 for disease restaging given his history of melanoma, which showed mild to moderate increased uptake corresponding to left retroperitoneal and left common iliac lymph nodes which are borderline enlarged with nonspecific appearance, but metastatic adenopathy cannot be excluded; subtle area of sclerosis involving the lateral aspect of the right fifth rib with corresponding mild increased uptake within SUV max of 3.56, which could represent posttraumatic change, but  underlying metastasis is not excluded.  He also underwent bone scan on 05/02/2018, which showed: focal abnormal uptake in the lateral right fifth rib corresponding to the abnormality on PET-CT and indeterminate for a metastasis versus traumatic etiology; no other evidence of osseous metastatic disease.    He began ADT in early 04/2018, and we met with the patient on 05/30/18. He was subsequently treated with 8 weeks of EBRT to the prostate and pelvic lymph nodes. His PSA reached a nadir of 0.18 in 11/2018. From that time, his PSA has been rising.  His PSA was 1.10 in December 2020, 2.6 in October 2021, 3.89 in January 2022 and most recently, 68.50 on 03/02/2021.  He is currently being treated with Gillermina Phy, as well as continuing ADT with Eligard every 6 months and Xgeva monthly.  His most recent Eligard injection was given on 03/10/2021.  He has developed progressive bone pain since his last visit, managed with percocet. He underwent bone scan on 02/25/21 showing marked worsening of widespread osseous metastatic disease since prior examination in 04/2018. His most recent PSA showed a significant increase at 68.5 on 03/02/21 from 3.89 in 07/2020, and his alkaline phosphatase has also increased.  He reports severe pain in his low back, right hip into the right leg as well as the right shoulder and right elbow, progressively worsening and significantly impacting his functional status.  The Percocet is no longer providing good pain relief so he is primarily in active because he is only moderately comfortable when he is lying down.  He has been kindly referred back to [place] today for consideration of Xofigo in the management of his osseous metastatic disease.  PREVIOUS RADIATION THERAPY: Yes 08/19/18-09/25/18: with concurrent  ADT 1. The prostate, seminal vesicles, and pelvic lymph nodes were initially treated to 45 Gy in 25 fractions of 1.8 Gy  2. The prostate only was boosted to 75 Gy with 15 additional fractions of 2.0 Gy    Ionized thyroid radiation  PAST MEDICAL HISTORY:  Past Medical History:  Diagnosis Date   AAA (abdominal aortic aneurysm) (HCC)    CHF (congestive heart failure) (HCC)    Coronary artery disease    a. CABG 2001 with early failure of VG-RCA. b. Inf MI after Lexiscan 01/28/2009 s/p RCA stent. c. Stent to dRCA 02/24/09. d. DES to [place]  2012. e. NSTEMI s/p angioplasty for ISR of RCA 09/2011. f. 05/2012 Cath: Med Rx; g. Cath 12/2019 -> med rx.   GERD (gastroesophageal reflux disease)    History of echocardiogram    a. 09/2014 Echo: EF 55-60%, mild basal-midinferior HK, triv AI.   Hyperlipidemia    Hypertension    Hypertensive heart disease    Hypothyroidism    Myocardial infarction [facility])    Osteoarthritis    a.End-stage osteoarthritis, right knee, medial compartment   Personal history of tobacco use    Prostate cancer ([place])    Skin cancer    melanoma   Type II diabetes mellitus ([place])       PAST SURGICAL HISTORY: Past Surgical History:  Procedure Laterality Date   CARDIAC CATHETERIZATION     CARDIAC CATHETERIZATION N/A 12/06/2015   Procedure: Left Heart Cath and Cors/Grafts Angiography;  Surgeon: Burnell Blanks, MD;  Location: [place] CV LAB;  Service: Cardiovascular;  Laterality: N/A;   CARDIAC CATHETERIZATION N/A 12/06/2015   Procedure: Coronary Balloon Angioplasty;  Surgeon: Burnell Blanks, MD;  Location: [place] [facility];  Service: Cardiovascular;  Laterality: N/A;   CATARACT EXTRACTION     CORONARY ARTERY BYPASS GRAFT     EYE SURGERY     HERNIA REPAIR  1985   INTRAVASCULAR ULTRASOUND/IVUS N/A 10/15/2017   Procedure: Intravascular Ultrasound/IVUS;  Surgeon: Belva Crome, MD;  Location: [place] [facility];  Service: Cardiovascular;  Laterality: N/A;   KNEE ARTHROSCOPY  1999 and 2004   LEFT HEART CATH AND CORS/GRAFTS ANGIOGRAPHY N/A 10/15/2017   Procedure: LEFT HEART CATH AND CORS/GRAFTS ANGIOGRAPHY;  Surgeon: Belva Crome, MD;  Location: [place] [facility]  LAB;  Service: Cardiovascular;  Laterality: N/A;   LEFT HEART CATH AND CORS/GRAFTS ANGIOGRAPHY N/A 01/07/2020   Procedure: LEFT HEART CATH AND CORS/GRAFTS ANGIOGRAPHY;  Surgeon: Sherren Mocha, MD;  Location: [place] [facility] LAB;  Service: Cardiovascular;  Laterality: N/A;   LEFT HEART CATHETERIZATION WITH CORONARY ANGIOGRAM N/A 10/09/2011   Procedure: LEFT HEART CATHETERIZATION WITH CORONARY ANGIOGRAM;  Surgeon: Peter M Martinique, MD;  Location: [facility] CATH LAB;  Service: Cardiovascular;  Laterality: N/A;   LEFT HEART CATHETERIZATION WITH CORONARY/GRAFT ANGIOGRAM N/A 06/06/2012   Procedure: LEFT HEART CATHETERIZATION WITH Beatrix Fetters;  Surgeon: Peter M Martinique, MD;  Location: [facility] CATH LAB;  Service: Cardiovascular;  Laterality: N/A;    FAMILY HISTORY:  Family History  Problem Relation Age of Onset   Hypertension Mother    Unexplained death Mother    Throat cancer Sister    Lung cancer Sister    Heart disease Brother    Pneumonia Brother    Heart disease Brother    Prostate cancer Brother    Unexplained death Brother        at birth   Unexplained death Brother    Prostate cancer Son  Lung cancer Son    Prostate cancer Other     SOCIAL HISTORY:  Social History   Socioeconomic History   Marital status: Married    Spouse name: Not on file   Number of children: 2   Years of education: Not on file   Highest education level: Not on file  Occupational History   Not on file  Tobacco Use   Smoking status: Former    Packs/day: 1.00    Years: 20.00    Pack years: 20.00    Types: Cigarettes    Quit date: 08/01/1975    Years since quitting: 45.6   Smokeless tobacco: Never  Vaping Use   Vaping Use: Never used  Substance and Sexual Activity   Alcohol use: No   Drug use: No   Sexual activity: Not Currently  Other Topics Concern   Not on file  Social History Narrative   Lives locally with wife.   Social Determinants of Health   Financial Resource Strain: Not on file  Food  Insecurity: Not on file  Transportation Needs: Not on file  Physical Activity: Not on file  Stress: Not on file  Social Connections: Not on file  Intimate Partner Violence: Not on file    ALLERGIES: Codeine  MEDICATIONS:  Current Outpatient Medications  Medication Sig Dispense Refill   acetaminophen (TYLENOL) 500 MG tablet Take 500-1,000 mg by mouth every 8 (eight) hours as needed for mild pain or headache.      aspirin 81 MG tablet Take 1 tablet (81 mg total) by mouth daily.     clopidogrel (PLAVIX) 75 MG tablet TAKE 1 TABLET BY MOUTH  DAILY 90 tablet 3   enzalutamide (XTANDI) 40 MG capsule Take 160 mg by mouth at bedtime.     ferrous sulfate 325 (65 FE) MG EC tablet Take 325-650 mg by mouth See admin instructions. Take 325 mg by mouth in the morning after breakfast and 650 mg after supper     fluocinonide (LIDEX) 0.05 % external solution Apply 1 mL topically 2 (two) times daily as needed (dryness and itching of the scalp).   3   hydrochlorothiazide (HYDRODIURIL) 25 MG tablet Take 1 tablet by mouth daily at 12 noon.     hydrocortisone 2.5 % ointment Apply 1 application topically as directed.     isosorbide mononitrate (IMDUR) 120 MG 24 hr tablet TAKE 1 TABLET BY MOUTH  DAILY 90 tablet 3   levothyroxine (SYNTHROID, LEVOTHROID) 100 MCG tablet Take 100 mcg by mouth daily before breakfast.      lisinopril (ZESTRIL) 10 MG tablet TAKE 1 TABLET BY MOUTH  DAILY 90 tablet 0   metFORMIN (GLUCOPHAGE) 1000 MG tablet Take 500-1,000 mg by mouth See admin instructions. Take 1,000 mg by mouth in the morning before breakfast and 500 mg at bedtime     metoprolol tartrate (LOPRESSOR) 25 MG tablet TAKE 1 TABLET BY MOUTH  TWICE DAILY 180 tablet 3   Multiple Vitamins-Minerals (PRESERVISION AREDS 2) CAPS Take 1 capsule by mouth 2 (two) times daily with a meal.     ondansetron (ZOFRAN ODT) 4 MG disintegrating tablet Take 1 tablet (4 mg total) by mouth every 8 (eight) hours as needed. 10 tablet 0   Oyster Shell  Calcium 500 MG TABS Take 1 tablet by mouth daily at 12 noon.     pantoprazole (PROTONIX) 40 MG tablet TAKE 1 TABLET BY MOUTH  TWICE DAILY 180 tablet 2   Polyethyl Glycol-Propyl Glycol (SYSTANE FREE OP) Place  1 drop into both eyes every 8 (eight) hours as needed (for dry eyes).      potassium chloride (KLOR-CON) 10 MEQ tablet TAKE 1 TABLET BY MOUTH AT  NIGHT 90 tablet 3   rosuvastatin (CRESTOR) 20 MG tablet TAKE 1 TABLET BY MOUTH  DAILY 90 tablet 0   tamsulosin (FLOMAX) 0.4 MG CAPS capsule Take 0.4 mg by mouth in the morning and at bedtime.     No current facility-administered medications for this encounter.    REVIEW OF SYSTEMS:  On review of systems, the patient reports that he is doing well overall. He denies any chest pain, shortness of breath, cough, fevers, chills, or night sweats. He notes chronic issues with diarrhea but resolved nausea and vomiting. His IPSS was 15, indicating moderate urinary symptoms. He reports pain rated 7/10 as above in the HPI, poor energy, dysuria, nocturia x3, urinary frequency and urgency, weak urine stream, mild diarrhea, and constipation. A complete review of systems is obtained and is otherwise negative.   PHYSICAL EXAM:  Wt Readings from Last 3 Encounters:  08/02/20 162 lb 12.8 oz (73.8 kg)  02/18/20 160 lb (72.6 kg)  01/27/20 163 lb 6.4 oz (74.1 kg)   Temp Readings from Last 3 Encounters:  11/16/20 97.8 F (36.6 C) (Oral)  04/10/20 98.6 F (37 C) (Oral)  02/18/20 (!) 97.4 F (36.3 C)   BP Readings from Last 3 Encounters:  11/16/20 (!) 162/87  08/02/20 (!) 144/88  04/10/20 (!) 174/89   Pulse Readings from Last 3 Encounters:  11/16/20 61  08/02/20 64  04/10/20 71   Pain Assessment Pain Score: 7 /10  Physical exam not performed in light of telephone encounter.   KPS = 90  100 - Normal; no complaints; no evidence of disease. 90   - Able to carry on normal activity; minor signs or symptoms of disease. 80   - Normal activity with effort;  some signs or symptoms of disease. 90   - Cares for self; unable to carry on normal activity or to do active work. 60   - Requires occasional assistance, but is able to care for most of his personal needs. 50   - Requires considerable assistance and frequent medical care. 95   - Disabled; requires special care and assistance. 50   - Severely disabled; hospital admission is indicated although death not imminent. 42   - Very sick; hospital admission necessary; active supportive treatment necessary. 10   - Moribund; fatal processes progressing rapidly. 0     - Dead  Karnofsky DA, Abelmann Mingus, Craver LS and Burchenal [facility] [phone]) The use of the nitrogen mustards in the palliative treatment of carcinoma: with particular reference to bronchogenic carcinoma Cancer 1 634-56  LABORATORY DATA:  Lab Results  Component Value Date   WBC 4.8 11/16/2020   HGB 10.7 (L) 11/16/2020   HCT 32.7 (L) 11/16/2020   MCV 81.3 11/16/2020   PLT 230 11/16/2020   Lab Results  Component Value Date   NA 134 (L) 11/16/2020   K 3.6 11/16/2020   CL 99 11/16/2020   CO2 26 11/16/2020   Lab Results  Component Value Date   ALT 10 11/16/2020   AST 17 11/16/2020   ALKPHOS 114 11/16/2020   BILITOT 1.1 11/16/2020     RADIOGRAPHY: NM Bone Scan Whole Body  Result Date: 02/28/2021 CLINICAL DATA:  History of prostate cancer.  Bone pain EXAM: NUCLEAR MEDICINE WHOLE BODY BONE SCAN TECHNIQUE: Whole body anterior and posterior images  were obtained approximately 3 hours after intravenous injection of radiopharmaceutical. RADIOPHARMACEUTICALS:  20.9 mCi Technetium-61mMDP IV COMPARISON:  05/02/2018 FINDINGS: Considerable worsening of metastatic disease to bone since the prior study of 2019. Scattered involvement seen throughout the thoracic and lumbar spine. Involvement of multiple ribs on both sides in both focal and long segment fashion. Involvement of both shoulder regions as well as the diaphysis of the humerus on both sides.  Focal involvement of pelvis in the right iliac bone. Focal involvement of the proximal femurs on both sides. IMPRESSION: Marked worsening of widespread osseous metastatic disease since the prior examination of October 2019 as outlined above. Electronically Signed   By: MNelson ChimesM.D.   On: 02/28/2021 10:53       IMPRESSION/PLAN: This visit was conducted via telephone to spare the patient unnecessary potential exposure in the healthcare setting during the current COVID-19 pandemic. 1. 86y.o. 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 Today, we talked to the patient and family about the findings and workup thus far. We discussed the natural history of metastatic castrate resistant prostate carcinoma and general treatment, highlighting the role of radiotherapy in the management. We discussed the available radiation techniques, including Xofigo and palliative, focused external beam radiation (EBRT). We focused on the rationale for each, using Xofigo to treat the diffuse bony metastases and palliative EBRT to spot treat painful specific sites of metastases.  We reviewed the details and logistics of delivery for each as well as the anticipated acute and late sequelae associated with radiation in this setting. He does have multiple sites of painful disease as well as a rapidly rising PSA so we have recommended proceeding with a PSMA PET scan to gather more information regarding the extent of his disease and to help further guide treatment recommendations.  We also discussed a referral to Dr. SAlen Blewto get his expert opinion regarding other treatment options going forward now that he has become castrate resistant.  The patient and his wife were encouraged to ask questions that were answered to their stated satisfaction.  At the conclusion of our  conversation, he is in agreement to proceed with the recommended PSMA PET scan and a consult with Dr. SAlen Blewin medical oncology.  Once we have the results from his PSMA PET scan as well as recommendations from Dr. SAlen Blew we will follow-up with the patient to coordinate treatment planning accordingly.  He and his wife appear to have a good understanding of our recommendations and are comfortable and in agreement with the stated plan.  I will share this discussion with Dr. MTresa Mooreto keep him in the loop as well.  Given current concerns for patient exposure during the COVID-19 pandemic, this encounter was conducted via telephone. The patient was notified in advance and was offered [place] to allow for face to face communication but unfortunately reported that he did not have the appropriate resources/technology to support such a visit and instead preferred to proceed with telephone consult. The patient has given verbal consent for this type of encounter. The attendants for this meeting include MTyler PitaMD, AAndi Devon patient, CKIM LAUVERand his wife, MJoycelyn SchmidDuring the encounter, MTyler PitaMD and AFreeman CaldronPA-C were [facility].  Patient, Benjamin MCKELLIPSand his wife were located at home.  We personally spent 60 minutes in this encounter including chart review, reviewing radiological studies, meeting face-to-face with the patient, entering orders and completing documentation.    Nicholos Johns, PA-C    Tyler Pita, MD  [facility] Direct Dial: [phone]  Fax: [phone] [email or web address]  Skype  [email or web address]   This document serves as a record of services personally performed by Tyler Pita, MD and Freeman Caldron, PA-C. It was created on their behalf by Wilburn Mylar, a trained medical scribe. The creation of this record is based on the scribe's personal  observations and the provider's statements to them. This document has been checked and approved by the attending provider.

## 2021-03-21 ENCOUNTER — Telehealth: Payer: Self-pay | Admitting: Oncology

## 2021-03-21 NOTE — Telephone Encounter (Signed)
Received a new pt referral from Dr. Tammi Klippel for prostate cancer. Benjamin Harrison has been cld and scheduled to see Dr. Alen Blew on 8/30 at 11am. Appt date and time has been given tot he pt's wife.

## 2021-03-25 ENCOUNTER — Telehealth: Payer: Self-pay | Admitting: Oncology

## 2021-03-25 NOTE — Telephone Encounter (Signed)
Rescheduled 08/30 appointment to 09/02 due to radiations request. Called and informed patient's spouse regarding upcoming appointments.

## 2021-03-29 ENCOUNTER — Ambulatory Visit: Payer: Medicare Other | Admitting: Oncology

## 2021-03-31 ENCOUNTER — Other Ambulatory Visit: Payer: Self-pay

## 2021-03-31 ENCOUNTER — Encounter (HOSPITAL_COMMUNITY)
Admission: RE | Admit: 2021-03-31 | Discharge: 2021-03-31 | Disposition: A | Discharge status: Home / Self Care | Payer: Medicare Other | Source: Ambulatory Visit | Attending: Urology | Admitting: Urology

## 2021-03-31 DIAGNOSIS — C61 Malignant neoplasm of prostate: Secondary | ICD-10-CM | POA: Diagnosis not present

## 2021-03-31 DIAGNOSIS — C7951 Secondary malignant neoplasm of bone: Secondary | ICD-10-CM | POA: Diagnosis present

## 2021-03-31 MED ORDER — PIFLIFOLASTAT F 18 (PYLARIFY) INJECTION
9.0000 | Freq: Once | INTRAVENOUS | Status: AC
  Administered 2021-03-31: 9.63 via INTRAVENOUS

## 2021-04-01 ENCOUNTER — Inpatient Hospital Stay: Payer: Medicare Other | Attending: Oncology | Admitting: Oncology

## 2021-04-01 VITALS — BP 106/72 | HR 99 | Temp 99.0°F | Resp 18 | Ht 63.0 in | Wt 135.5 lb

## 2021-04-01 DIAGNOSIS — G893 Neoplasm related pain (acute) (chronic): Secondary | ICD-10-CM

## 2021-04-01 DIAGNOSIS — C61 Malignant neoplasm of prostate: Secondary | ICD-10-CM | POA: Diagnosis not present

## 2021-04-01 DIAGNOSIS — C7951 Secondary malignant neoplasm of bone: Secondary | ICD-10-CM

## 2021-04-01 DIAGNOSIS — Z801 Family history of malignant neoplasm of trachea, bronchus and lung: Secondary | ICD-10-CM

## 2021-04-01 DIAGNOSIS — R634 Abnormal weight loss: Secondary | ICD-10-CM

## 2021-04-01 DIAGNOSIS — D500 Iron deficiency anemia secondary to blood loss (chronic): Secondary | ICD-10-CM

## 2021-04-01 DIAGNOSIS — Z87891 Personal history of nicotine dependence: Secondary | ICD-10-CM

## 2021-04-01 DIAGNOSIS — Z8042 Family history of malignant neoplasm of prostate: Secondary | ICD-10-CM

## 2021-04-01 DIAGNOSIS — Z808 Family history of malignant neoplasm of other organs or systems: Secondary | ICD-10-CM

## 2021-04-01 DIAGNOSIS — D649 Anemia, unspecified: Secondary | ICD-10-CM

## 2021-04-01 MED ORDER — FENTANYL 25 MCG/HR TD PT72: 1.0000 | MEDICATED_PATCH | TRANSDERMAL | 0 refills | Status: DC

## 2021-04-01 NOTE — Progress Notes (Signed)
Reason for the request:    Prostate cancer  HPI: I was asked by Dr. Tammi Klippel to evaluate Benjamin Harrison for evaluation of prostate cancer.  He is an 86 year old man with prostate cancer diagnosed in 2019.  At that time he found to have a Gleason score 4+3 equal 7 in all 12 cores.  His staging scans did not show at that time convincing evidence of metastatic disease although he did have a uptake in the bone scan occluding the lateral right fifth rib.  He received androgen deprivation therapy in October 2019 and subsequently definitive therapy with external beam radiation for 8 weeks completed subsequently.  He reached a PSA nadir of 0.18 in May 2020.  He was started on Xtandi under the care of Dr. Tresa Moore and his PSA started to rise and was up to 1.1 in December 2020.  It was 2.6 in October 2021 and 3.89 in January 2022.  In August 2022 was 68.5.  He remains on Eligard and monthly Xgeva.  He has increased to diffuse was evaluated by Dr. Tammi Klippel for possible palliative radiation therapy or Xofigo.  Clinically, Benjamin Harrison reports rather rapid decline in his overall health.  He reports increased pain in the bone that is rather diffuse and not localized.  His weight has been down and his performance status has declined rapidly.  He is pretty much wheelchair and bedbound and having extreme difficulty moving out of his house.  He has been using Percocet every 3 hours with little relief to his pain.    He does not report any headaches, blurry vision, syncope or seizures. Does not report any fevers, chills or sweats.  Does not report any cough, wheezing or hemoptysis.  Does not report any chest pain, palpitation, orthopnea or leg edema.  Does not report any nausea, vomiting or abdominal pain.  Does not report any constipation or diarrhea.    Does not report frequency, urgency or hematuria.  Does not report any skin rashes or lesions. Does not report any heat or cold intolerance.  Does not report any lymphadenopathy or  petechiae.  Does not report any anxiety or depression.  Remaining review of systems is negative.     Past Medical History:  Diagnosis Date   AAA (abdominal aortic aneurysm) (HCC)    CHF (congestive heart failure) (HCC)    Coronary artery disease    a. CABG 2001 with early failure of VG-RCA. b. Inf MI after Lexiscan 01/28/2009 s/p RCA stent. c. Stent to dRCA 02/24/09. d. DES to [place]  2012. e. NSTEMI s/p angioplasty for ISR of RCA 09/2011. f. 05/2012 Cath: Med Rx; g. Cath 12/2019 -> med rx.   GERD (gastroesophageal reflux disease)    History of echocardiogram    a. 09/2014 Echo: EF 55-60%, mild basal-midinferior HK, triv AI.   Hyperlipidemia    Hypertension    Hypertensive heart disease    Hypothyroidism    Myocardial infarction [facility])    Osteoarthritis    a.End-stage osteoarthritis, right knee, medial compartment   Personal history of tobacco use    Prostate cancer ([place])    Skin cancer    melanoma   Type II diabetes mellitus ([place])   :   Past Surgical History:  Procedure Laterality Date   CARDIAC CATHETERIZATION     CARDIAC CATHETERIZATION N/A 12/06/2015   Procedure: Left Heart Cath and Cors/Grafts Angiography;  Surgeon: Burnell Blanks, MD;  Location: [place] [facility];  Service: Cardiovascular;  Laterality: N/A;  CARDIAC CATHETERIZATION N/A 12/06/2015   Procedure: Coronary Balloon Angioplasty;  Surgeon: Burnell Blanks, MD;  Location: [place] [facility];  Service: Cardiovascular;  Laterality: N/A;   CATARACT EXTRACTION     CORONARY ARTERY BYPASS GRAFT     EYE SURGERY     HERNIA REPAIR  1985   INTRAVASCULAR ULTRASOUND/IVUS N/A 10/15/2017   Procedure: Intravascular Ultrasound/IVUS;  Surgeon: Belva Crome, MD;  Location: [place] [facility];  Service: Cardiovascular;  Laterality: N/A;   KNEE ARTHROSCOPY  1999 and 2004   LEFT HEART CATH AND CORS/GRAFTS ANGIOGRAPHY N/A 10/15/2017   Procedure: LEFT HEART CATH AND CORS/GRAFTS ANGIOGRAPHY;  Surgeon: Belva Crome, MD;   Location: [place] [facility];  Service: Cardiovascular;  Laterality: N/A;   LEFT HEART CATH AND CORS/GRAFTS ANGIOGRAPHY N/A 01/07/2020   Procedure: LEFT HEART CATH AND CORS/GRAFTS ANGIOGRAPHY;  Surgeon: Sherren Mocha, MD;  Location: [place] [facility];  Service: Cardiovascular;  Laterality: N/A;   LEFT HEART CATHETERIZATION WITH CORONARY ANGIOGRAM N/A 10/09/2011   Procedure: LEFT HEART CATHETERIZATION WITH CORONARY ANGIOGRAM;  Surgeon: Peter M Martinique, MD;  Location: [facility];  Service: Cardiovascular;  Laterality: N/A;   LEFT HEART CATHETERIZATION WITH CORONARY/GRAFT ANGIOGRAM N/A 06/06/2012   Procedure: LEFT HEART CATHETERIZATION WITH Beatrix Fetters;  Surgeon: Peter M Martinique, MD;  Location: [facility] LAB;  Service: Cardiovascular;  Laterality: N/A;  :   Current Outpatient Medications:    acetaminophen (TYLENOL) 500 MG tablet, Take 500-1,000 mg by mouth every 8 (eight) hours as needed for mild pain or headache. , Disp: , Rfl:    aspirin 81 MG tablet, Take 1 tablet (81 mg total) by mouth daily., Disp: , Rfl:    clopidogrel (PLAVIX) 75 MG tablet, TAKE 1 TABLET BY MOUTH  DAILY, Disp: 90 tablet, Rfl: 3   enzalutamide (XTANDI) 40 MG capsule, Take 160 mg by mouth at bedtime., Disp: , Rfl:    ferrous sulfate 325 (65 FE) MG EC tablet, Take 325-650 mg by mouth See admin instructions. Take 325 mg by mouth in the morning after breakfast and 650 mg after supper, Disp: , Rfl:    fluocinonide (LIDEX) 0.05 % external solution, Apply 1 mL topically 2 (two) times daily as needed (dryness and itching of the scalp). , Disp: , Rfl: 3   hydrochlorothiazide (HYDRODIURIL) 25 MG tablet, Take 1 tablet by mouth daily at 12 noon., Disp: , Rfl:    hydrocortisone 2.5 % ointment, Apply 1 application topically as directed., Disp: , Rfl:    isosorbide mononitrate (IMDUR) 120 MG 24 hr tablet, TAKE 1 TABLET BY MOUTH  DAILY, Disp: 90 tablet, Rfl: 3   levothyroxine (SYNTHROID, LEVOTHROID) 100 MCG tablet, Take 100 mcg by  mouth daily before breakfast. , Disp: , Rfl:    lisinopril (ZESTRIL) 10 MG tablet, TAKE 1 TABLET BY MOUTH  DAILY, Disp: 90 tablet, Rfl: 0   metFORMIN (GLUCOPHAGE) 1000 MG tablet, Take 500-1,000 mg by mouth See admin instructions. Take 1,000 mg by mouth in the morning before breakfast and 500 mg at bedtime, Disp: , Rfl:    metoprolol tartrate (LOPRESSOR) 25 MG tablet, TAKE 1 TABLET BY MOUTH  TWICE DAILY, Disp: 180 tablet, Rfl: 3   Multiple Vitamins-Minerals (PRESERVISION AREDS 2) CAPS, Take 1 capsule by mouth 2 (two) times daily with a meal., Disp: , Rfl:    ondansetron (ZOFRAN ODT) 4 MG disintegrating tablet, Take 1 tablet (4 mg total) by mouth every 8 (eight) hours as needed., Disp: 10 tablet, Rfl:  0   Oyster Shell Calcium 500 MG TABS, Take 1 tablet by mouth daily at 12 noon., Disp: , Rfl:    pantoprazole (PROTONIX) 40 MG tablet, TAKE 1 TABLET BY MOUTH  TWICE DAILY, Disp: 180 tablet, Rfl: 2   Polyethyl Glycol-Propyl Glycol (SYSTANE FREE OP), Place 1 drop into both eyes every 8 (eight) hours as needed (for dry eyes). , Disp: , Rfl:    potassium chloride (KLOR-CON) 10 MEQ tablet, TAKE 1 TABLET BY MOUTH AT  NIGHT, Disp: 90 tablet, Rfl: 3   rosuvastatin (CRESTOR) 20 MG tablet, TAKE 1 TABLET BY MOUTH  DAILY, Disp: 90 tablet, Rfl: 0   tamsulosin (FLOMAX) 0.4 MG CAPS capsule, Take 0.4 mg by mouth in the morning and at bedtime., Disp: , Rfl: :   Allergies  Allergen Reactions   Codeine Nausea Only    Pt reports this happens off and on (??)  :   Family History  Problem Relation Age of Onset   Hypertension Mother    Unexplained death Mother    Throat cancer Sister    Lung cancer Sister    Heart disease Brother    Pneumonia Brother    Heart disease Brother    Prostate cancer Brother    Unexplained death Brother        at birth   Unexplained death Brother    Prostate cancer Son    Lung cancer Son    Prostate cancer Other   :   Social History   Socioeconomic History   Marital status:  Married    Spouse name: Not on file   Number of children: 2   Years of education: Not on file   Highest education level: Not on file  Occupational History   Not on file  Tobacco Use   Smoking status: Former    Packs/day: 1.00    Years: 20.00    Pack years: 20.00    Types: Cigarettes    Quit date: 08/01/1975    Years since quitting: 45.6   Smokeless tobacco: Never  Vaping Use   Vaping Use: Never used  Substance and Sexual Activity   Alcohol use: No   Drug use: No   Sexual activity: Not Currently  Other Topics Concern   Not on file  Social History Narrative   Lives locally with wife.   Social Determinants of Health   Financial Resource Strain: Not on file  Food Insecurity: Not on file  Transportation Needs: Not on file  Physical Activity: Not on file  Stress: Not on file  Social Connections: Not on file  Intimate Partner Violence: Not on file  :  Pertinent items are noted in HPI.  Exam: Blood pressure 106/72, pulse 99, temperature 99 F (37.2 C), temperature source Oral, resp. rate 18, height '5\' 3"'$  (1.6 m), weight 135 lb 8 oz (61.5 kg), SpO2 99 %. ECOG 3 General appearance: alert and cooperative appeared chronically ill and in mild distress. Head: atraumatic without any abnormalities. Eyes: conjunctivae/corneas clear. PERRL.  Sclera anicteric. Throat: lips, mucosa, and tongue normal; without oral thrush or ulcers. Resp: clear to auscultation bilaterally without rhonchi, wheezes or dullness to percussion. Cardio: regular rate and rhythm, S1, S2 normal, no murmur, click, rub or gallop GI: soft, non-tender; bowel sounds normal; no masses,  no organomegaly Skin: Skin color, texture, turgor normal. No rashes or lesions Lymph nodes: Cervical, supraclavicular, and axillary nodes normal. Neurologic: Grossly normal without any motor, sensory or deep tendon reflexes. Musculoskeletal: No joint deformity  or effusion.    Assessment and Plan:    86 year old with:  1.   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  He was initially diagnosed in 2019 with Gleason score 7 and localized disease at that time.  He has received androgen deprivation therapy with radiation and subsequently started on Xtandi.  He has developed progression of disease based on a rise in his PSA.  Bone scan obtained on February 25, 2021.  Showed widespread bone disease.  PET scan obtained on March 31, 2021 confirmed the presence of widespread bone disease although the final report is not available at this time.   His disease status was updated at this time and treatment options were discussed with the patient and his family.  His performance status has declined rapidly and his treatment options are limited.  He is not a candidate for Taxotere chemotherapy at this time and marginal candidate for any additional treatment such as Xofigo.  After discussion today, I recommended proceeding with hospice and discontinue all cancer treatment at this time.  He is in favor proceeding with this approach and not receive any additional treatment.  He understands this prognosis is poor with very limited life expectancy.  Hospice referral has already been made by Dr. Tresa Moore for the family.  2.  Pain: He needs long-acting pain medication and I recommended proceeding with a fentanyl patch.  This will be started in the immediate future at 25 mcg every 3 days.  He is to continue Percocet as needed.   3.  Prognosis and goals of care: Prognosis very poor with life expectancy very limited in a matter of weeks.  He is a hospice candidate.  4.  Follow-up:  will be as needed.   60  minutes were dedicated to this visit. The time was spent on reviewing laboratory data, imaging studies, discussing treatment options, and answering questions regarding future plan.       A copy of this consult has been forwarded to the requesting physician.

## 2021-04-07 ENCOUNTER — Telehealth: Payer: Self-pay | Admitting: Cardiovascular Disease

## 2021-04-07 NOTE — Telephone Encounter (Signed)
Pts grand daughter would like to inform Dr. Acie Fredrickson that pt is currently in hospice care. No need to reach out.

## 2021-04-18 ENCOUNTER — Telehealth (HOSPITAL_COMMUNITY): Payer: Self-pay | Admitting: Vascular Surgery

## 2021-04-18 NOTE — Telephone Encounter (Signed)
Spoke with Joycelyn Schmid (Spouse) and was informed that the patient is in hospice and will no longer be making appointments with [place].  I voiced understanding, and proceeded to cancel CSD- 1 yr f/u +AAA+MD per dc sheet 02/18/20  LS.

## 2021-04-25 ENCOUNTER — Other Ambulatory Visit: Payer: Self-pay | Admitting: Cardiovascular Disease

## 2021-04-29 ENCOUNTER — Other Ambulatory Visit: Payer: Medicare Other

## 2021-05-02 ENCOUNTER — Ambulatory Visit: Payer: Medicare Other | Admitting: Cardiovascular Disease

## 2021-05-31 DEATH — deceased
